# Patient Record
Sex: Male | Born: 1980
Health system: Southern US, Community
[De-identification: ages and names within clinical notes are randomized; demographics above are authoritative.]

## PROBLEM LIST (undated history)

## (undated) DIAGNOSIS — K219 Gastro-esophageal reflux disease without esophagitis: Secondary | ICD-10-CM

## (undated) DIAGNOSIS — I428 Other cardiomyopathies: Secondary | ICD-10-CM

## (undated) DIAGNOSIS — I10 Essential (primary) hypertension: Secondary | ICD-10-CM

## (undated) DIAGNOSIS — D638 Anemia in other chronic diseases classified elsewhere: Secondary | ICD-10-CM

## (undated) DIAGNOSIS — N186 End stage renal disease: Secondary | ICD-10-CM

## (undated) DIAGNOSIS — E119 Type 2 diabetes mellitus without complications: Secondary | ICD-10-CM

## (undated) DIAGNOSIS — I119 Hypertensive heart disease without heart failure: Secondary | ICD-10-CM

## (undated) DIAGNOSIS — I5042 Chronic combined systolic (congestive) and diastolic (congestive) heart failure: Secondary | ICD-10-CM

## (undated) DIAGNOSIS — M1990 Unspecified osteoarthritis, unspecified site: Secondary | ICD-10-CM

## (undated) DIAGNOSIS — Z9889 Other specified postprocedural states: Secondary | ICD-10-CM

## (undated) DIAGNOSIS — J40 Bronchitis, not specified as acute or chronic: Secondary | ICD-10-CM

## (undated) DIAGNOSIS — R51 Headache: Secondary | ICD-10-CM

## (undated) DIAGNOSIS — F419 Anxiety disorder, unspecified: Secondary | ICD-10-CM

## (undated) DIAGNOSIS — G4730 Sleep apnea, unspecified: Secondary | ICD-10-CM

## (undated) HISTORY — DX: Unspecified osteoarthritis, unspecified site: M19.90

## (undated) HISTORY — DX: End stage renal disease: N18.6

## (undated) HISTORY — DX: Hypertensive heart disease without heart failure: I11.9

## (undated) HISTORY — DX: Other cardiomyopathies: I42.8

## (undated) HISTORY — DX: Essential (primary) hypertension: I10

## (undated) HISTORY — DX: Chronic combined systolic (congestive) and diastolic (congestive) heart failure: I50.42

## (undated) HISTORY — DX: Other specified postprocedural states: Z98.890

---

## 2012-07-22 LAB — BASIC METABOLIC PANEL
BUN: 4 mg/dL (ref 4–21)
Creatinine: 3.8 mg/dL — AB (ref <=1.3)

## 2012-07-29 LAB — BASIC METABOLIC PANEL
Potassium: 3.8 mmol/L (ref 3.4–5.3)
Sodium: 135 mmol/L — AB (ref 137–147)

## 2012-09-02 ENCOUNTER — Ambulatory Visit: Payer: Self-pay | Admitting: Family Medicine

## 2012-09-02 VITALS — BP 154/95 | HR 95 | Temp 98.3°F | Resp 16 | Ht 71.4 in | Wt 261.2 lb

## 2012-09-02 DIAGNOSIS — G609 Hereditary and idiopathic neuropathy, unspecified: Secondary | ICD-10-CM

## 2012-09-02 DIAGNOSIS — I10 Essential (primary) hypertension: Secondary | ICD-10-CM

## 2012-09-02 DIAGNOSIS — E1129 Type 2 diabetes mellitus with other diabetic kidney complication: Secondary | ICD-10-CM

## 2012-09-02 DIAGNOSIS — E1121 Type 2 diabetes mellitus with diabetic nephropathy: Secondary | ICD-10-CM | POA: Insufficient documentation

## 2012-09-02 DIAGNOSIS — E119 Type 2 diabetes mellitus without complications: Secondary | ICD-10-CM | POA: Insufficient documentation

## 2012-09-02 LAB — BASIC METABOLIC PANEL
BUN: 36 mg/dL — ABNORMAL HIGH (ref 6–23)
CO2: 20 mEq/L (ref 19–32)
Chloride: 105 mEq/L (ref 96–112)
Creat: 3.41 mg/dL — ABNORMAL HIGH (ref 0.50–1.35)
Glucose, Bld: 94 mg/dL (ref 70–99)
Potassium: 4.7 mEq/L (ref 3.5–5.3)

## 2012-09-02 MED ORDER — HYDRALAZINE HCL 100 MG PO TABS: 100.0000 mg | ORAL_TABLET | Freq: Three times a day (TID) | ORAL | Status: DC

## 2012-09-02 MED ORDER — GLYBURIDE 5 MG PO TABS: 5.0000 mg | ORAL_TABLET | Freq: Every day | ORAL | Status: DC

## 2012-09-02 MED ORDER — NIFEDIPINE ER OSMOTIC RELEASE 60 MG PO TB24: 60.0000 mg | ORAL_TABLET | Freq: Two times a day (BID) | ORAL | Status: DC

## 2012-09-02 MED ORDER — CLONIDINE HCL 0.3 MG PO TABS: 0.3000 mg | ORAL_TABLET | Freq: Two times a day (BID) | ORAL | Status: DC

## 2012-09-02 MED ORDER — GABAPENTIN 100 MG PO CAPS: 300.0000 mg | ORAL_CAPSULE | Freq: Three times a day (TID) | ORAL | Status: DC

## 2012-09-02 MED ORDER — ISOSORBIDE MONONITRATE ER 60 MG PO TB24: 60.0000 mg | ORAL_TABLET | Freq: Two times a day (BID) | ORAL | Status: DC

## 2012-09-02 MED ORDER — LISINOPRIL 40 MG PO TABS: 40.0000 mg | ORAL_TABLET | Freq: Two times a day (BID) | ORAL | Status: DC

## 2012-09-02 MED ORDER — CARVEDILOL 25 MG PO TABS: 25.0000 mg | ORAL_TABLET | Freq: Two times a day (BID) | ORAL | Status: DC

## 2012-09-02 NOTE — Progress Notes (Addendum)
Subjective: 32 year old man who recently moved here from new been [place]. He been in the hospital down there 3 weeks ago with not feeling good. He been diagnosed with having bad diabetes. He is seeing a kidney specialist and told that his kidneys were working at 44%. He is a diabetic. He comes in here with a number of medications that he needs to be refilled. He does not have a history of any other back major illnesses or injuries past. He did turn his ankle a few days ago. He does not have insurance and he is not want me to get an x-ray of the ankle this time.  He formerly was a Scientist, clinical (histocompatibility and immunogenetics), and is looking around for a job here. He is staying with some friends. He has been watching his diet, eating less carbohydrates, has lost 13 pounds in the past month.  Objective: Pleasant alert oriented gentleman in no major acute distress. Walking stick is for himself because of the ankle sprain. He has neck supple without nodes. Chest clear to auscultation. Heart regular without murmurs. With 2+ edema of his feet and legs, more on the left. Neurosensory is adequate though he has been told he has peripheral neuropathy. Feet do burn at times.  Assessment: Type 2 diabetes mellitus Hypertension, unsatisfactory control CKD Weight loss by dieting  Plan: We'll try and get his old hospital records. He has not known exactly why he is on the imdur.   Those records came from the hospital when he was ready to lose. He did have a significant elevation of the BUN and creatinine anything the hospital, consistent with significant kidney disease. His creatinine had come down to 2.5 by the time of discharge. Wet sugars were generally controlled. We will scan those records in his chart. He is to return in 1 month. I will see what it is been met shows.

## 2012-09-02 NOTE — Patient Instructions (Addendum)
Continue current medications   Return in one month for recheck, sooner if worse  For diabetic information I do recommend the American diabetic association website.

## 2012-09-06 ENCOUNTER — Telehealth: Payer: Self-pay

## 2012-09-06 NOTE — Telephone Encounter (Signed)
Pt is calling back because he states that someone called him about 3 today

## 2012-09-07 NOTE — Telephone Encounter (Signed)
Called patient, what is he referring to ? Looks like lab is also trying to contact him, left message for him to call back about labs.

## 2012-09-07 NOTE — Telephone Encounter (Signed)
Spoke with patient and he will call back later to discuss issues.

## 2012-09-26 ENCOUNTER — Emergency Department (HOSPITAL_COMMUNITY): Payer: Self-pay

## 2012-09-26 ENCOUNTER — Inpatient Hospital Stay (HOSPITAL_COMMUNITY)
Admission: EM | Admit: 2012-09-26 | Discharge: 2012-09-29 | DRG: 683 | Disposition: A | Discharge status: Home / Self Care | Payer: MEDICAID | Attending: Family Medicine | Admitting: Family Medicine

## 2012-09-26 ENCOUNTER — Observation Stay (HOSPITAL_COMMUNITY): Payer: Self-pay

## 2012-09-26 ENCOUNTER — Encounter (HOSPITAL_COMMUNITY): Payer: Self-pay | Admitting: *Deleted

## 2012-09-26 DIAGNOSIS — I10 Essential (primary) hypertension: Secondary | ICD-10-CM

## 2012-09-26 DIAGNOSIS — I129 Hypertensive chronic kidney disease with stage 1 through stage 4 chronic kidney disease, or unspecified chronic kidney disease: Secondary | ICD-10-CM

## 2012-09-26 DIAGNOSIS — Z9119 Patient's noncompliance with other medical treatment and regimen: Secondary | ICD-10-CM

## 2012-09-26 DIAGNOSIS — E1121 Type 2 diabetes mellitus with diabetic nephropathy: Secondary | ICD-10-CM

## 2012-09-26 DIAGNOSIS — N186 End stage renal disease: Secondary | ICD-10-CM

## 2012-09-26 DIAGNOSIS — R112 Nausea with vomiting, unspecified: Secondary | ICD-10-CM

## 2012-09-26 DIAGNOSIS — E119 Type 2 diabetes mellitus without complications: Secondary | ICD-10-CM

## 2012-09-26 DIAGNOSIS — E860 Dehydration: Secondary | ICD-10-CM

## 2012-09-26 DIAGNOSIS — I161 Hypertensive emergency: Secondary | ICD-10-CM

## 2012-09-26 DIAGNOSIS — D631 Anemia in chronic kidney disease: Secondary | ICD-10-CM | POA: Diagnosis present

## 2012-09-26 DIAGNOSIS — N184 Chronic kidney disease, stage 4 (severe): Secondary | ICD-10-CM | POA: Diagnosis present

## 2012-09-26 DIAGNOSIS — N039 Chronic nephritic syndrome with unspecified morphologic changes: Secondary | ICD-10-CM | POA: Diagnosis present

## 2012-09-26 DIAGNOSIS — Z91199 Patient's noncompliance with other medical treatment and regimen due to unspecified reason: Secondary | ICD-10-CM

## 2012-09-26 DIAGNOSIS — D72829 Elevated white blood cell count, unspecified: Secondary | ICD-10-CM | POA: Diagnosis present

## 2012-09-26 DIAGNOSIS — E1129 Type 2 diabetes mellitus with other diabetic kidney complication: Secondary | ICD-10-CM

## 2012-09-26 DIAGNOSIS — N058 Unspecified nephritic syndrome with other morphologic changes: Secondary | ICD-10-CM

## 2012-09-26 DIAGNOSIS — N189 Chronic kidney disease, unspecified: Secondary | ICD-10-CM

## 2012-09-26 DIAGNOSIS — Z794 Long term (current) use of insulin: Secondary | ICD-10-CM

## 2012-09-26 LAB — CBC WITH DIFFERENTIAL/PLATELET
Basophils Absolute: 0 10*3/uL (ref 0.0–0.1)
Eosinophils Absolute: 0.2 10*3/uL (ref 0.0–0.7)
HCT: 30.5 % — ABNORMAL LOW (ref 39.0–52.0)
Lymphocytes Relative: 11 % — ABNORMAL LOW (ref 12–46)
MCHC: 33.4 g/dL (ref 30.0–36.0)
Neutro Abs: 12.7 10*3/uL — ABNORMAL HIGH (ref 1.7–7.7)
Platelets: 197 10*3/uL (ref 150–400)
RDW: 25.2 % — ABNORMAL HIGH (ref 11.5–15.5)

## 2012-09-26 LAB — URINE MICROSCOPIC-ADD ON

## 2012-09-26 LAB — POCT I-STAT, CHEM 8
Calcium, Ion: 1.15 mmol/L (ref 1.12–1.23)
Glucose, Bld: 140 mg/dL — ABNORMAL HIGH (ref 70–99)
HCT: 34 % — ABNORMAL LOW (ref 39.0–52.0)
Hemoglobin: 11.6 g/dL — ABNORMAL LOW (ref 13.0–17.0)
Potassium: 4.2 mEq/L (ref 3.5–5.1)

## 2012-09-26 LAB — COMPREHENSIVE METABOLIC PANEL
ALT: 8 U/L (ref 0–53)
Alkaline Phosphatase: 120 U/L — ABNORMAL HIGH (ref 39–117)
BUN: 35 mg/dL — ABNORMAL HIGH (ref 6–23)
CO2: 22 mEq/L (ref 19–32)
Calcium: 9.1 mg/dL (ref 8.4–10.5)
GFR calc Af Amer: 24 mL/min — ABNORMAL LOW (ref >90)
GFR calc non Af Amer: 21 mL/min — ABNORMAL LOW (ref >90)
Glucose, Bld: 142 mg/dL — ABNORMAL HIGH (ref 70–99)
Sodium: 139 mEq/L (ref 135–145)

## 2012-09-26 LAB — URINALYSIS, ROUTINE W REFLEX MICROSCOPIC
Glucose, UA: NEGATIVE mg/dL
Hgb urine dipstick: NEGATIVE
Ketones, ur: NEGATIVE mg/dL
Protein, ur: >300 mg/dL — AB

## 2012-09-26 LAB — CBC
HCT: 28.7 % — ABNORMAL LOW (ref 39.0–52.0)
Hemoglobin: 9.5 g/dL — ABNORMAL LOW (ref 13.0–17.0)
MCH: 21.6 pg — ABNORMAL LOW (ref 26.0–34.0)
MCHC: 33.1 g/dL (ref 30.0–36.0)
RBC: 4.4 MIL/uL (ref 4.22–5.81)

## 2012-09-26 LAB — GLUCOSE, CAPILLARY

## 2012-09-26 LAB — IRON AND TIBC: Iron: 48 ug/dL (ref 42–135)

## 2012-09-26 LAB — RETICULOCYTES: Retic Count, Absolute: 184.8 10*3/uL (ref 19.0–186.0)

## 2012-09-26 LAB — FOLATE: Folate: 8.6 ng/mL

## 2012-09-26 LAB — HEMOGLOBIN A1C: Mean Plasma Glucose: 114 mg/dL (ref <117)

## 2012-09-26 LAB — MAGNESIUM: Magnesium: 1.9 mg/dL (ref 1.5–2.5)

## 2012-09-26 LAB — MRSA PCR SCREENING: MRSA by PCR: NEGATIVE

## 2012-09-26 MED ORDER — ONDANSETRON HCL 4 MG/2ML IJ SOLN
4.0000 mg | Freq: Once | INTRAMUSCULAR | Status: AC
  Administered 2012-09-26: 4 mg via INTRAVENOUS
  Filled 2012-09-26: qty 2

## 2012-09-26 MED ORDER — ONDANSETRON HCL 4 MG/2ML IJ SOLN
4.0000 mg | Freq: Four times a day (QID) | INTRAMUSCULAR | Status: DC | PRN
  Administered 2012-09-26 – 2012-09-27 (×2): 4 mg via INTRAVENOUS
  Filled 2012-09-26 (×2): qty 2

## 2012-09-26 MED ORDER — LISINOPRIL 40 MG PO TABS
40.0000 mg | ORAL_TABLET | Freq: Two times a day (BID) | ORAL | Status: DC
  Filled 2012-09-26: qty 1

## 2012-09-26 MED ORDER — HYDRALAZINE HCL 20 MG/ML IJ SOLN
15.0000 mg | INTRAMUSCULAR | Status: DC | PRN
  Administered 2012-09-26: 15 mg via INTRAVENOUS
  Filled 2012-09-26 (×2): qty 1

## 2012-09-26 MED ORDER — CARVEDILOL 25 MG PO TABS
25.0000 mg | ORAL_TABLET | Freq: Two times a day (BID) | ORAL | Status: DC
  Administered 2012-09-26 – 2012-09-29 (×7): 25 mg via ORAL
  Filled 2012-09-26 (×8): qty 1

## 2012-09-26 MED ORDER — ACETAMINOPHEN 650 MG RE SUPP: 650.0000 mg | Freq: Four times a day (QID) | RECTAL | Status: DC | PRN

## 2012-09-26 MED ORDER — MORPHINE SULFATE 2 MG/ML IJ SOLN
1.0000 mg | INTRAMUSCULAR | Status: DC | PRN
  Administered 2012-09-26 – 2012-09-28 (×3): 1 mg via INTRAVENOUS
  Filled 2012-09-26 (×3): qty 1

## 2012-09-26 MED ORDER — ONDANSETRON HCL 4 MG/2ML IJ SOLN
4.0000 mg | Freq: Once | INTRAMUSCULAR | Status: AC
  Administered 2012-09-26: 4 mg via INTRAVENOUS

## 2012-09-26 MED ORDER — SODIUM CHLORIDE 0.9 % IJ SOLN
3.0000 mL | Freq: Two times a day (BID) | INTRAMUSCULAR | Status: DC
  Administered 2012-09-26 – 2012-09-29 (×5): 3 mL via INTRAVENOUS

## 2012-09-26 MED ORDER — SODIUM CHLORIDE 0.9 % IV SOLN: INTRAVENOUS | Status: DC

## 2012-09-26 MED ORDER — SODIUM CHLORIDE 0.9 % IV SOLN
INTRAVENOUS | Status: DC
  Administered 2012-09-26: 125 mL/h via INTRAVENOUS
  Administered 2012-09-27 (×2): via INTRAVENOUS

## 2012-09-26 MED ORDER — INSULIN ASPART 100 UNIT/ML ~~LOC~~ SOLN
0.0000 [IU] | Freq: Three times a day (TID) | SUBCUTANEOUS | Status: DC
  Administered 2012-09-26: 2 [IU] via SUBCUTANEOUS

## 2012-09-26 MED ORDER — ONDANSETRON HCL 4 MG PO TABS
4.0000 mg | ORAL_TABLET | Freq: Four times a day (QID) | ORAL | Status: DC | PRN
  Administered 2012-09-28: 4 mg via ORAL
  Filled 2012-09-26: qty 1

## 2012-09-26 MED ORDER — METOCLOPRAMIDE HCL 5 MG/ML IJ SOLN
10.0000 mg | Freq: Three times a day (TID) | INTRAMUSCULAR | Status: DC | PRN
  Administered 2012-09-26: 10 mg via INTRAVENOUS
  Filled 2012-09-26 (×2): qty 2

## 2012-09-26 MED ORDER — NITROPRUSSIDE SODIUM 25 MG/ML IV SOLN
0.2500 ug/kg/min | Freq: Once | INTRAVENOUS | Status: AC
  Administered 2012-09-26: 0.251 ug/kg/min via INTRAVENOUS
  Filled 2012-09-26: qty 2

## 2012-09-26 MED ORDER — LISINOPRIL 40 MG PO TABS
40.0000 mg | ORAL_TABLET | Freq: Every day | ORAL | Status: DC
  Administered 2012-09-26 – 2012-09-29 (×4): 40 mg via ORAL
  Filled 2012-09-26 (×4): qty 1

## 2012-09-26 MED ORDER — MORPHINE SULFATE 4 MG/ML IJ SOLN
4.0000 mg | Freq: Once | INTRAMUSCULAR | Status: AC
  Administered 2012-09-26: 4 mg via INTRAVENOUS
  Filled 2012-09-26: qty 1

## 2012-09-26 MED ORDER — LABETALOL HCL 5 MG/ML IV SOLN
20.0000 mg | Freq: Once | INTRAVENOUS | Status: AC
  Administered 2012-09-26: 20 mg via INTRAVENOUS
  Filled 2012-09-26: qty 4

## 2012-09-26 MED ORDER — HEPARIN SODIUM (PORCINE) 5000 UNIT/ML IJ SOLN
5000.0000 [IU] | Freq: Three times a day (TID) | INTRAMUSCULAR | Status: DC
  Administered 2012-09-26 – 2012-09-29 (×9): 5000 [IU] via SUBCUTANEOUS
  Filled 2012-09-26 (×12): qty 1

## 2012-09-26 MED ORDER — CLONIDINE HCL 0.2 MG PO TABS
0.3000 mg | ORAL_TABLET | Freq: Once | ORAL | Status: AC
  Administered 2012-09-26: 0.3 mg via ORAL
  Filled 2012-09-26: qty 1

## 2012-09-26 MED ORDER — NIFEDIPINE ER 60 MG PO TB24
60.0000 mg | ORAL_TABLET | Freq: Two times a day (BID) | ORAL | Status: DC
  Administered 2012-09-26 – 2012-09-29 (×7): 60 mg via ORAL
  Filled 2012-09-26 (×8): qty 1

## 2012-09-26 MED ORDER — ACETAMINOPHEN 325 MG PO TABS
650.0000 mg | ORAL_TABLET | Freq: Four times a day (QID) | ORAL | Status: DC | PRN
  Administered 2012-09-26 – 2012-09-28 (×3): 650 mg via ORAL
  Filled 2012-09-26 (×3): qty 2

## 2012-09-26 MED ORDER — ISOSORBIDE MONONITRATE ER 60 MG PO TB24
120.0000 mg | ORAL_TABLET | Freq: Every day | ORAL | Status: DC
  Administered 2012-09-26 – 2012-09-29 (×4): 120 mg via ORAL
  Filled 2012-09-26 (×4): qty 2

## 2012-09-26 MED ORDER — CLONIDINE HCL 0.3 MG PO TABS
0.3000 mg | ORAL_TABLET | Freq: Three times a day (TID) | ORAL | Status: DC
  Administered 2012-09-26 – 2012-09-29 (×10): 0.3 mg via ORAL
  Filled 2012-09-26 (×11): qty 1

## 2012-09-26 MED ORDER — LABETALOL HCL 5 MG/ML IV SOLN
10.0000 mg | INTRAVENOUS | Status: DC | PRN
  Administered 2012-09-26 (×2): 10 mg via INTRAVENOUS
  Filled 2012-09-26 (×2): qty 4

## 2012-09-26 MED ORDER — HYDRALAZINE HCL 20 MG/ML IJ SOLN
20.0000 mg | INTRAMUSCULAR | Status: DC | PRN
  Administered 2012-09-26: 20 mg via INTRAVENOUS
  Filled 2012-09-26: qty 1

## 2012-09-26 MED ORDER — INSULIN ASPART 100 UNIT/ML ~~LOC~~ SOLN
0.0000 [IU] | Freq: Three times a day (TID) | SUBCUTANEOUS | Status: DC
  Administered 2012-09-26 – 2012-09-27 (×2): 1 [IU] via SUBCUTANEOUS
  Administered 2012-09-27: 2 [IU] via SUBCUTANEOUS
  Administered 2012-09-27: 1 [IU] via SUBCUTANEOUS
  Administered 2012-09-28: 14:00:00 via SUBCUTANEOUS
  Administered 2012-09-29: 1 [IU] via SUBCUTANEOUS

## 2012-09-26 MED ORDER — LABETALOL HCL 5 MG/ML IV SOLN
20.0000 mg | Freq: Once | INTRAVENOUS | Status: AC
  Administered 2012-09-26: 20 mg via INTRAVENOUS
  Filled 2012-09-26 (×2): qty 4

## 2012-09-26 MED ORDER — HYDRALAZINE HCL 20 MG/ML IJ SOLN: 10.0000 mg | INTRAMUSCULAR | Status: DC | PRN

## 2012-09-26 MED ORDER — HYDRALAZINE HCL 20 MG/ML IJ SOLN
25.0000 mg | Freq: Once | INTRAMUSCULAR | Status: AC
  Administered 2012-09-26: 25 mg via INTRAVENOUS
  Filled 2012-09-26: qty 1

## 2012-09-26 NOTE — ED Notes (Signed)
Care transferred, report received Otto Herb, Therapist, sports.

## 2012-09-26 NOTE — ED Notes (Addendum)
Hypertensive. No cp, sob, and no stroke like symptoms.  Not able to keep nothing down - did not take bp meds. Today, having h/a's, so called ems b/c of high bp.  Sob with exertion. cbg 155; manual bp 250/180 - x 2 checks.

## 2012-09-26 NOTE — H&P (Signed)
[facility] Admission History and Physical Service Pager: [phone]  Patient name: Joshua [name] Medical record number: [id number] Date of birth: [birth year] Age: 32 y.o. Gender: male  Primary Care Provider: Provider Not In System  Chief Complaint: Nausea, Vomiting, Headache  Assessment and Plan: Joshua [name] is a 32 y.o. year old male with PMH of DM, CKD, and poorly controlled HTN who presents with recent nausea/vomiting, dehydration, and Hypertensive emergency.  # Hypertensive Emergency - Etiology presumed secondary to not taking any of his six PO HTN meds in past 7 days; No evidence of secondary cause of HTN at this point - Will admit to step down, Attending Dr. Lindell Noe. - Will start patient Hydralazine 15 mg Q4 PRN for BP >200/110 (Per ICU physician recommendations) - Home Clonidine 0.3 mg increased to TID.  Will continue home Coreg 25 mg BID, Imdur 120 mg daily, Lisinopril 40 mg BID and Procardia 60 mg BID.   - Will monitor closely in Step down unit.  Will consider further workup for secondary causes of HTN if BP continues to remain elevated despite treatment. - Obtain UDS  # Dehydration secondary to Nausea/vomiting and poor PO intake - Etiology of nausea/vomiting is possible diabetic gastroparesis, post-viral ileus - Patient appears dehydrated on physical exam with dry mucous membranes. - Will continue IV fluids at 125 mL/hr. - Zofran and Reglan for nausea/vomiting - Will start patient on clear liquid diet and advance as tolerated  # DM - CBG 140 on admission. No prior A1C's documented in EPIC - Will obtain A1C - Will start patient on SSI (sensitive).  CBG checks TID AC and QHS - Will monitor closely and titrate Insulin as needed.  # CKD - Baseline Creatinine 3.4 - 4.0. - Creatine appears stable at 3.5 on admission. - Will monitor closely via BMP's. - Consider inpatient nephrology consultation   # Anemia  - Hb 11.6 on admission. - Will obtain  Anemia panel to further evaluate.  FEN/GI: NS @ 125 mL/hr, Clear liquid diet Prophylaxis: Heparin SQ Disposition: Pending clinical improvement and stabilization of BP Code Status: full code  History of Present Illness:  Joshua [name] is a 32 y.o. year old male with PMH of DM, CKD, and poorly controlled HTN who presents to the ED with headache, N/V.  He has recently relocated to the area. He has been followed by a nephrologist previously.  He reports severe, intractable nausea/vomiting over the past 7 days (since Monday, 4/28).  He states that he has been unable to keep solids and liquids down over this period of time. Additionally, the patient has not taken his home PO HTN meds.  He has had no recent sick contacts.  He denies ingestion of uncooked/undercook meats/foods.  No relieving factors.  He denies associated abdominal pain, diarrhea, or fever. Emesis described as nonbilious and nonbloody.  Given severe nausea/vomiting, he went to the ED for further evaluation.  In the ED, patient was found to have BP of 252/182.  He also endorsed headache as well.  Nausea was treated with Zofran and BP was treated with Labetalol 20 mg x 2, Hydralazine 25 mg x 1, and Clonidine 0.3 mg x 1.  BP continued to be elevated and he was then started on Nipride gtt.  Given markedly elevated BP, CCM was consulted and Nipride was discontinued.  BP improved to ~190's/120-130's.  Family medicine was then called for admission.   Patient Active Problem List   Diagnosis Date Noted  . Diabetes 09/02/2012  .  Diabetic kidney disease 09/02/2012  . HTN (hypertension), benign 09/02/2012   Past Medical History: Past Medical History  Diagnosis Date  . Arthritis   . Diabetes mellitus without complication   . Chronic kidney disease   . Hypertension    Past Surgical History: History reviewed. No pertinent past surgical history. Social History: History  Substance Use Topics  . Smoking status: Never Smoker   . Smokeless  tobacco: Not on file  . Alcohol Use: No    Family History: Family History  Problem Relation Age of Onset  . Diabetes Father   . Hypertension Father   . Hypertension Maternal Grandmother   . Hypertension Maternal Grandfather   . Kidney disease Maternal Grandfather   . Diabetes Maternal Grandfather    Allergies: No Known Allergies No current facility-administered medications on file prior to encounter.   Current Outpatient Prescriptions on File Prior to Encounter  Medication Sig Dispense Refill  . aspirin 325 MG tablet Take 325 mg by mouth daily.      . carvedilol (COREG) 25 MG tablet Take 1 tablet (25 mg total) by mouth 2 (two) times daily.  60 tablet  2  . cloNIDine (CATAPRES) 0.3 MG tablet Take 1 tablet (0.3 mg total) by mouth 2 (two) times daily.  60 tablet  2  . ferrous sulfate 325 (65 FE) MG tablet Take 325 mg by mouth 2 (two) times daily.      Marland Kitchen gabapentin (NEURONTIN) 100 MG capsule Take 3 capsules (300 mg total) by mouth 3 (three) times daily.  90 capsule  3  . glyBURIDE (DIABETA) 5 MG tablet Take 1 tablet (5 mg total) by mouth daily.  30 tablet  3  . hydrALAZINE (APRESOLINE) 100 MG tablet Take 1 tablet (100 mg total) by mouth 3 (three) times daily.  90 tablet  3  . isosorbide mononitrate (IMDUR) 60 MG 24 hr tablet Take 1 tablet (60 mg total) by mouth 2 (two) times daily.  60 tablet  2  . lisinopril (PRINIVIL,ZESTRIL) 40 MG tablet Take 1 tablet (40 mg total) by mouth 2 (two) times daily.  60 tablet  2  . NIFEdipine (PROCARDIA XL/ADALAT-CC) 60 MG 24 hr tablet Take 1 tablet (60 mg total) by mouth 2 (two) times daily.  60 tablet  2   Review Of Systems: Per HPI. Otherwise 12 point review of systems was performed and was unremarkable.  Physical Exam: BP 209/138  Pulse 88  Temp(Src) 97.3 F (36.3 C) (Oral)  Resp 19  Ht 5' 11.5" (1.816 m)  Wt 255 lb (115.667 kg)  BMI 35.07 kg/m2  SpO2 100% Exam: General: resting in bed, appears uncomfortable, but only endorses mild  headache. HEENT: NCAT. PERRLA.  Unable to visualize optic disc secondary to constricted pupils. Cardiovascular: RRR. No murmurs, rubs, or gallops. Respiratory: CTAB. No rales, rhonchi, or wheezing appreciated. Abdomen: obese, soft, nontender, nondistended. No palpable organomegaly. +BS Extremities: No edema noted. Skin: Hyperpigmentation noted on back. Acanthosis nigricans also noted. Neuro: AO x 3. No focal deficits on exam.  Labs and Imaging: CBC BMET   Recent Labs Lab 09/26/12 0349 09/26/12 0411  WBC 15.2*  --   HGB 10.2* 11.6*  HCT 30.5* 34.0*  PLT 197  --     Recent Labs Lab 09/26/12 0411  NA 140  K 4.2  CL 108  BUN 35*  CREATININE 3.50*  GLUCOSE 140*     Ct Head Wo Contrast 09/26/2012  *RADIOLOGY REPORT*  Clinical Data: Hypertension, nausea, emesis.  CT  HEAD WITHOUT CONTRAST  Technique:  Contiguous axial images were obtained from the base of the skull through the vertex without contrast.  Comparison: None.  Findings: Subtle white matter hypoattenuation is suggested, more pronounced within the posterior centrum semiovale right greater than left.  There is no evidence for acute hemorrhage, hydrocephalus, mass lesion, or abnormal extra-axial fluid collection.  No definite CT evidence for acute infarction.  The visualized paranasal sinuses and mastoid air cells are predominately clear.  IMPRESSION: Nonspecific mild white matter hypoattenuation as above. MR has increased sensitivity and specificity for posterior reversible encephalopathy syndrome (PRES) if clinically warranted.    Thersa Salt, DO 09/26/2012, 8:49 AM  I have examined this patient and reviewed Dr. Jonathon Jordan documentation. I have made minor additions and agree with the assessment and plan.   Marena Chancy Maricela Bo, MD, MBA 09/26/2012, 1:12 PM Family Medicine Resident, PGY-2 [phone] pager

## 2012-09-26 NOTE — ED Notes (Signed)
Pt with nausea and emesis. Dr. Lindell Noe made aware.

## 2012-09-26 NOTE — Consult Note (Signed)
PULMONARY  / CRITICAL CARE MEDICINE  Name: Joshua [name] MRN: [id number] DOB: Jun 21, 1980    ADMISSION DATE:  09/26/2012 CONSULTATION DATE:  09/26/2012   REFERRING MD :  ED PRIMARY SERVICE: Internal Medicine  CHIEF COMPLAINT:  Headache  BRIEF PATIENT DESCRIPTION: 83 year AA with HTN, DM, CKD. Stopped taking BP meds for last week due to nausea. Presents with complaints of worsening headache and BP 250/130  SIGNIFICANT EVENTS / STUDIES:  5/4>> 250/135 BP in ED, improved to 199/110 after labetlol IVP x2, hydralazine 25mg  IVP and clonidine 0.3 mg oral x1. Headache also improved  LINES / TUBES: PIV  CULTURES: none  ANTIBIOTICS: none  HISTORY OF PRESENT ILLNESS:  32 year old african [place] male with hard to control HTN, DM and CKD. Presents to the ED with complaints of worsening headache over the last 2 days. He describes the HA as throbbing, 10/10 intensity at its peak, no associated symptoms. Made better only after administration of morphine in ED. Patient is on multiple BP medication including clonidine which he has not taken since last Sunday night to nausea that started last Monday. Episode of vomiting from Monday through Thursday which he describes as gastric contents, last episode of emesis was Thursday. In the ED was found to have BP 250/135, CT head negative for acute changes. Started briefly on nitroprusside gtt.   CCM called for evaluation of hypertensive urgency.  PAST MEDICAL HISTORY :  Past Medical History  Diagnosis Date  . Arthritis   . Diabetes mellitus without complication   . Chronic kidney disease   . Hypertension    History reviewed. No pertinent past surgical history. Prior to Admission medications   Medication Sig Start Date End Date Taking? Authorizing Provider  aspirin 325 MG tablet Take 325 mg by mouth daily.   Yes Historical Provider, MD  carvedilol (COREG) 25 MG tablet Take 1 tablet (25 mg total) by mouth 2 (two) times daily. 09/02/12  Yes Posey Boyer,  MD  cloNIDine (CATAPRES) 0.3 MG tablet Take 1 tablet (0.3 mg total) by mouth 2 (two) times daily. 09/02/12  Yes Posey Boyer, MD  ferrous sulfate 325 (65 FE) MG tablet Take 325 mg by mouth 2 (two) times daily.   Yes Historical Provider, MD  gabapentin (NEURONTIN) 100 MG capsule Take 3 capsules (300 mg total) by mouth 3 (three) times daily. 09/02/12  Yes Posey Boyer, MD  glyBURIDE (DIABETA) 5 MG tablet Take 1 tablet (5 mg total) by mouth daily. 09/02/12  Yes Posey Boyer, MD  hydrALAZINE (APRESOLINE) 100 MG tablet Take 1 tablet (100 mg total) by mouth 3 (three) times daily. 09/02/12  Yes Posey Boyer, MD  isosorbide mononitrate (IMDUR) 60 MG 24 hr tablet Take 1 tablet (60 mg total) by mouth 2 (two) times daily. 09/02/12  Yes Posey Boyer, MD  lisinopril (PRINIVIL,ZESTRIL) 40 MG tablet Take 1 tablet (40 mg total) by mouth 2 (two) times daily. 09/02/12  Yes Posey Boyer, MD  NIFEdipine (PROCARDIA XL/ADALAT-CC) 60 MG 24 hr tablet Take 1 tablet (60 mg total) by mouth 2 (two) times daily. 09/02/12  Yes Posey Boyer, MD   No Known Allergies  FAMILY HISTORY:  Family History  Problem Relation Age of Onset  . Diabetes Father   . Hypertension Father   . Hypertension Maternal Grandmother   . Hypertension Maternal Grandfather   . Kidney disease Maternal Grandfather   . Diabetes Maternal Grandfather    SOCIAL HISTORY:  reports that  he has never smoked. He does not have any smokeless tobacco history on file. He reports that he does not drink alcohol or use illicit drugs.  REVIEW OF SYSTEMS:   Positives in BOLD  Constitutional: Negative for fever, chills, weight loss, malaise/fatigue and diaphoresis.  HENT: Negative for hearing loss, ear pain, nosebleeds, congestion, sore throat, neck pain, tinnitus and ear discharge.  Eyes: Negative for blurred vision, double vision, photophobia, pain, discharge and redness.  Respiratory: cough, hemoptysis, sputum production, shortness of breath, wheezing and  no stridor.  Cardiovascular: Negative for chest pain, palpitations, orthopnea, claudication, leg swelling and PND.  Gastrointestinal: Negative for heartburn, nausea, vomiting (last episode Thursday), abdominal pain, diarrhea, constipation, blood in stool and melena.  Genitourinary: Negative for dysuria, urgency, frequency, hematuria and flank pain.  Musculoskeletal: Negative for myalgias, back pain, joint pain and falls.  Skin: Negative for itching and rash.  Neurological: Negative for dizziness, tingling, tremors, sensory change, speech change, focal weakness, seizures, loss of consciousness, weakness and headaches.  Endo/Heme/Allergies: Negative for environmental allergies and polydipsia. Does not bruise/bleed easily   SUBJECTIVE:   VITAL SIGNS: Temp:  [97.6 F (36.4 C)] 97.6 F (36.4 C) (05/04 0330) Pulse Rate:  [85-109] 85 (05/04 0445) Resp:  [16-36] 23 (05/04 0445) BP: (206-252)/(143-182) 227/154 mmHg (05/04 0445) SpO2:  [100 %] 100 % (05/04 0445) Weight:  [115.667 kg (255 lb)] 115.667 kg (255 lb) (05/04 0350) HEMODYNAMICS:   VENTILATOR SETTINGS:   INTAKE / OUTPUT: Intake/Output   None     PHYSICAL EXAMINATION: General: Well developed, well nourished, in no acute distress  Head: Eyes PERRLA, No xanthomas. Normal cephalic and atramatic. No papilledema noted Lungs: Clear bilaterally to auscultation and percussion.  Heart: HRRR S1 S2 Pulses are 2+ & equal.  No carotid bruit. No JVD. No abdominal bruits. No femoral bruits.  Abdomen: Bowel sounds are positive, abdomen soft and non-tender without masses  Extremities: No clubbing, cyanosis or edema. DP +1  Neuro: Alert and oriented X 3. No focal motor or sensory deficits Psych: Good affect, responds appropriately  LABS:  Recent Labs Lab 09/26/12 0349 09/26/12 0411  HGB 10.2* 11.6*  WBC 15.2*  --   PLT 197  --   NA  --  140  K  --  4.2  CL  --  108  GLUCOSE  --  140*  BUN  --  35*  CREATININE  --  3.50*   No  results found for this basename: GLUCAP,  in the last 168 hours  CXR: not done  ASSESSMENT / PLAN:  PULMONARY A:no acute issue P:    CARDIOVASCULAR A: Hypertensive urgency P:  -resume home regimen, medication. -monitor BP -add PRN hydralazine q 4 hrs for now -very young patient with hard to control HTN, would call PCP and see if w/u for secondary HTN done.  RENAL A:  CKD likely due to HTN and DM P:   -monitor renal function, urine output, avoid nephrotoxic medications -renally dose all meds  GASTROINTESTINAL A:  Nausea and Vomiting P:   -unclear etiology, maybe viral infections -resolved; last episode was Thursday as per patient -cardiac diet  HEMATOLOGIC A:  Microcytic anemia P:  -check stool guaiac, iron studies. -monitor H/H -transfuse if hgb<7.0  INFECTIOUS A:  No acute issues P:     ENDOCRINE A:  DM   P:   -check A1c -monitor FSBS -if able to tolerate diet would start oral hypoglycemic, in meantime would use SQ insulin  NEUROLOGIC A:  Heacheache  P:   -likely due to uncontrolled BP -CT head negative  TODAY'S SUMMARY: hypertensive urgency, briefly on nitroprusside gtt. BP improved after IV hydralazine and clonidine 0.3 mg.   I have personally obtained a history, examined the patient, evaluated laboratory and imaging results, formulated the assessment and plan and placed orders. CRITICAL CARE: The patient is critically ill with multiple organ systems failure and requires high complexity decision making for assessment and support, frequent evaluation and titration of therapies, application of advanced monitoring technologies and extensive interpretation of multiple databases. Critical Care Time devoted to patient care services described in this note is 30 minutes.    Pulmonary and [place] Pager: [phone]  09/26/2012, 7:01 AM

## 2012-09-26 NOTE — ED Notes (Signed)
Critical care medicine at bedside

## 2012-09-26 NOTE — ED Notes (Signed)
Critical care medicine to d/c nitroprusside.

## 2012-09-26 NOTE — H&P (Signed)
[facility] Attending Admit Note Patient seen and examined by me, discussed with resident team and I agree with their assessment and plan. Patient with malignant hypertension, markedly elevated BP with associated HA, admits to not having his home BP medications recently.  Moved to [place] from the [place] area.  His elevated serum Cr today (3.5) is in keeping with prior readings that are available in Epic. Plan for admission to SDU; re-establishment of home BP meds. DM control.  Old records from his prior doctors to establish prior workup for likely secondary HTN. Dalbert Mayotte, MD

## 2012-09-26 NOTE — ED Provider Notes (Signed)
History     CSN: [id number]  Arrival date & time 09/26/12  [id number]   First MD Initiated Contact with Patient 09/26/12 [phone]      Chief Complaint  Patient presents with  . Nausea  . Emesis  . Hypertension    (Consider location/radiation/quality/duration/timing/severity/associated sxs/prior treatment) HPI Complains of headache gradual onset yesterday and vomiting for the past 5 days. Patient has been unable to hold down any of his medications. He denies visual changes denies abdominal pain denies chest pain denies shortness of breath. No other associated symptoms. No treatment prior to coming here. Past Medical History  Diagnosis Date  . Arthritis   . Diabetes mellitus without complication   . Chronic kidney disease   . Hypertension     History reviewed. No pertinent past surgical history.  Family History  Problem Relation Age of Onset  . Diabetes Father   . Hypertension Father   . Hypertension Maternal Grandmother   . Hypertension Maternal Grandfather   . Kidney disease Maternal Grandfather   . Diabetes Maternal Grandfather     History  Substance Use Topics  . Smoking status: Never Smoker   . Smokeless tobacco: Not on file  . Alcohol Use: No     no alcohol no illicit drug use  Review of Systems  Constitutional: Negative.   Respiratory: Negative.   Cardiovascular: Negative.   Gastrointestinal: Positive for nausea and vomiting.  Musculoskeletal: Negative.   Skin: Negative.   Neurological: Positive for headaches.  Psychiatric/Behavioral: Negative.   All other systems reviewed and are negative.    Allergies  Review of patient's allergies indicates no known allergies.  Home Medications   Current Outpatient Rx  Name  Route  Sig  Dispense  Refill  . aspirin 325 MG tablet   Oral   Take 325 mg by mouth daily.         . carvedilol (COREG) 25 MG tablet   Oral   Take 1 tablet (25 mg total) by mouth 2 (two) times daily.   60 tablet   2   . cloNIDine  (CATAPRES) 0.3 MG tablet   Oral   Take 1 tablet (0.3 mg total) by mouth 2 (two) times daily.   60 tablet   2   . ferrous sulfate 325 (65 FE) MG tablet   Oral   Take 325 mg by mouth 2 (two) times daily.         Marland Kitchen gabapentin (NEURONTIN) 100 MG capsule   Oral   Take 3 capsules (300 mg total) by mouth 3 (three) times daily.   90 capsule   3   . glyBURIDE (DIABETA) 5 MG tablet   Oral   Take 1 tablet (5 mg total) by mouth daily.   30 tablet   3   . hydrALAZINE (APRESOLINE) 100 MG tablet   Oral   Take 1 tablet (100 mg total) by mouth 3 (three) times daily.   90 tablet   3   . isosorbide mononitrate (IMDUR) 60 MG 24 hr tablet   Oral   Take 1 tablet (60 mg total) by mouth 2 (two) times daily.   60 tablet   2   . lisinopril (PRINIVIL,ZESTRIL) 40 MG tablet   Oral   Take 1 tablet (40 mg total) by mouth 2 (two) times daily.   60 tablet   2   . NIFEdipine (PROCARDIA XL/ADALAT-CC) 60 MG 24 hr tablet   Oral   Take 1 tablet (60 mg total) by mouth  2 (two) times daily.   60 tablet   2     BP 242/176  Pulse 108  Temp(Src) 97.6 F (36.4 C) (Oral)  Resp 20  Ht 5' 11.5" (1.816 m)  Wt 255 lb (115.667 kg)  BMI 35.07 kg/m2  SpO2 100%  Physical Exam  Nursing note and vitals reviewed. Constitutional: He appears well-developed and well-nourished. He appears distressed.  Alert Glasgow Coma Score 15 appears mildly uncomfortable  HENT:  Head: Normocephalic and atraumatic.  Eyes: Conjunctivae are normal. Pupils are equal, round, and reactive to light.  Fundi not well visualized  Neck: Neck supple. No tracheal deviation present. No thyromegaly present.  Cardiovascular: Normal rate and regular rhythm.   No murmur heard. Pulmonary/Chest: Effort normal and breath sounds normal.  Abdominal: Soft. Bowel sounds are normal. He exhibits no distension. There is no tenderness.  Obese  Musculoskeletal: Normal range of motion. He exhibits no edema and no tenderness.  Neurological: He is  alert. Coordination normal.  Skin: Skin is warm and dry. No rash noted.  Psychiatric: He has a normal mood and affect.    ED Course  Procedures (including critical care time)  Labs Reviewed  CBC WITH DIFFERENTIAL   No results found.   No diagnosis found.    Date: 09/26/2012  Rate: 110  Rhythm: sinus tachycardia  QRS Axis: normal  Intervals: QT prolonged  ST/T Wave abnormalities: nonspecific ST/T changes  Conduction Disutrbances:none  Narrative Interpretation:   Old EKG Reviewed: none available Nitroprusside intravenous drip ordered to titrate to systolic blood pressure of 160 5:45 AM headache and nausea much improved after treatment with morphine, Zofran, nitroprusside intravenous drip and labetalol intravenously. Patient is alert Glasgow Coma Score 15. Nausea is minimal at present. MDM  I spoke with Dr.Deterding,, intensivist. She will send intensive care unit fellow to evaluate patient for admission to intensive care unit Diagnosis #1 hypertensive emergency  #2 renal insufficiency #3 hyperglycemia CRITICAL CARE Performed by: Orlie Dakin   Total critical care time: 40 minute  Critical care time was exclusive of separately billable procedures and treating other patients.  Critical care was necessary to treat or prevent imminent or life-threatening deterioration.  Critical care was time spent personally by me on the following activities: development of treatment plan with patient and/or surrogate as well as nursing, discussions with consultants, evaluation of patient's response to treatment, examination of patient, obtaining history from patient or surrogate, ordering and performing treatments and interventions, ordering and review of laboratory studies, ordering and review of radiographic studies, pulse oximetry and re-evaluation of patient's condition.       Orlie Dakin, MD 09/26/12 [phone]

## 2012-09-26 NOTE — Progress Notes (Addendum)
Patient cc/o headache which woke him up from sleeping,suddenly became violently nauseated and vomited clear yellow emesis 300 cc's. Medicated with tylenol 650 mg po which   Patient kept down, and got Reglan 10 mg IV.Blood pressure is up again 180/114 and received hydralazine 15 mg IV.Blood pressure rechecked an now it is 146/90 and patient is sleeping at present

## 2012-09-27 DIAGNOSIS — I10 Essential (primary) hypertension: Secondary | ICD-10-CM

## 2012-09-27 LAB — GLUCOSE, CAPILLARY
Glucose-Capillary: 129 mg/dL — ABNORMAL HIGH (ref 70–99)
Glucose-Capillary: 108 mg/dL — ABNORMAL HIGH (ref 70–99)

## 2012-09-27 LAB — DIFFERENTIAL
Basophils Absolute: 0 10*3/uL (ref 0.0–0.1)
Eosinophils Absolute: 0 10*3/uL (ref 0.0–0.7)
Lymphocytes Relative: 7 % — ABNORMAL LOW (ref 12–46)
Neutrophils Relative %: 88 % — ABNORMAL HIGH (ref 43–77)

## 2012-09-27 LAB — CBC
HCT: 30.2 % — ABNORMAL LOW (ref 39.0–52.0)
Hemoglobin: 9.6 g/dL — ABNORMAL LOW (ref 13.0–17.0)
Hemoglobin: 10.4 g/dL — ABNORMAL LOW (ref 13.0–17.0)
MCH: 21.5 pg — ABNORMAL LOW (ref 26.0–34.0)
MCV: 65.9 fL — ABNORMAL LOW (ref 78.0–100.0)
Platelets: 199 10*3/uL (ref 150–400)
RBC: 4.46 MIL/uL (ref 4.22–5.81)
WBC: 17 10*3/uL — ABNORMAL HIGH (ref 4.0–10.5)

## 2012-09-27 LAB — BASIC METABOLIC PANEL
BUN: 34 mg/dL — ABNORMAL HIGH (ref 6–23)
CO2: 23 mEq/L (ref 19–32)
Calcium: 8.8 mg/dL (ref 8.4–10.5)
Glucose, Bld: 135 mg/dL — ABNORMAL HIGH (ref 70–99)
Sodium: 141 mEq/L (ref 135–145)

## 2012-09-27 MED ORDER — HYDRALAZINE HCL 25 MG PO TABS
25.0000 mg | ORAL_TABLET | Freq: Four times a day (QID) | ORAL | Status: DC
  Administered 2012-09-27 – 2012-09-28 (×4): 25 mg via ORAL
  Filled 2012-09-27 (×9): qty 1

## 2012-09-27 MED ORDER — METOCLOPRAMIDE HCL 5 MG/ML IJ SOLN
10.0000 mg | Freq: Three times a day (TID) | INTRAMUSCULAR | Status: DC
  Administered 2012-09-27 – 2012-09-28 (×3): 10 mg via INTRAVENOUS
  Filled 2012-09-27 (×6): qty 2

## 2012-09-27 NOTE — Progress Notes (Signed)
Utilization review completed.  

## 2012-09-27 NOTE — Progress Notes (Signed)
FMTS Attending Admission Note: Tawnee Clegg,MD I  have seen and examined this patient, reviewed their chart. I have discussed this patient with the resident. I agree with the resident's findings, assessment and care plan.  I agree patient should get work up for secondary caused of HTN. Continue to monitor as well as temp,threshold for pan culture including lumbar puncture is low at this time due to elevated WBC with headache and N/V although no focal neurologic symptoms. Obtaining an MRI brain might be helpful at this point especially with prior abnormal CT head in the setting on continuous headache,n/v.

## 2012-09-27 NOTE — Progress Notes (Signed)
Family Medicine Teaching Service Daily Progress Note Service Page: [phone]  Subjective:  Feeling somewhat improved this am. Still reports mild headache and nausea.  Had episode of nonbloody, nonbilious emesis this am.  Objective: Temp:  [97.6 F (36.4 C)-98.5 F (36.9 C)] 97.6 F (36.4 C) (05/05 0400) Pulse Rate:  [78-93] 79 (05/05 0400) Resp:  [0-46] 32 (05/05 0400) BP: (126-213)/(71-141) 148/93 mmHg (05/05 0400) SpO2:  [98 %-100 %] 100 % (05/05 0400) Weight:  [249 lb 9 oz (113.2 kg)] 249 lb 9 oz (113.2 kg) (05/04 1146)   Intake/Output Summary (Last 24 hours) at 09/27/12 0759 Last data filed at 09/27/12 0746  Gross per 24 hour  Intake 2137.91 ml  Output   3450 ml  Net -1312.09 ml   Exam: General: resting comfortably in bed this am, NAD. Cardiovascular: RRR. No rmurmurs, rubs, or gallops. Respiratory: CTAB. No rales, rhonchi, or wheezing. Abdomen: soft, nontender, nondistended, no organomegaly. Extremities: no LE edema.  CBC BMET   Recent Labs Lab 09/26/12 0349 09/26/12 0411 09/26/12 1250 09/27/12 0443  WBC 15.2*  --  14.7* 15.1*  HGB 10.2* 11.6* 9.5* 9.6*  HCT 30.5* 34.0* 28.7* 29.4*  PLT 197  --  173 199    Recent Labs Lab 09/26/12 0411 09/26/12 1250 09/27/12 0443  NA 140 139 141  K 4.2 4.3 4.0  CL 108 105 107  CO2  --  22 23  BUN 35* 35* 34*  CREATININE 3.50* 3.66* 3.90*  GLUCOSE 140* 142* 135*  CALCIUM  --  9.1 8.8     Lab Results  Component Value Date   HGBA1C 5.6 09/26/2012   Imaging/Diagnostic Tests:  Ct Head Wo Contrast  09/26/2012 *RADIOLOGY REPORT* Clinical Data: Hypertension, nausea, emesis. CT HEAD WITHOUT CONTRAST Technique: Contiguous axial images were obtained from the base of the skull through the vertex without contrast. Comparison: None. Findings: Subtle white matter hypoattenuation is suggested, more pronounced within the posterior centrum semiovale right greater than left. There is no evidence for acute hemorrhage, hydrocephalus,  mass lesion, or abnormal extra-axial fluid collection. No definite CT evidence for acute infarction. The visualized paranasal sinuses and mastoid air cells are predominately clear. IMPRESSION: Nonspecific mild white matter hypoattenuation as above. MR has increased sensitivity and specificity for posterior reversible encephalopathy syndrome (PRES) if clinically warranted.   Assessment/Plan: # Hypertensive Emergency - Etiology presumed secondary to not taking any of his six PO HTN meds in past 7 days; No evidence of secondary cause of HTN at this point  - BP now markedly improved - 148/93 this am. - Will continue current management: Clonidine 0.3 mg TID, Coreg 25 mg BID, Imdur 120 mg daily, Lisinopril 40 mg BID and Procardia 60 mg BID.  - Will stop IV Hydralazine and change to PO 25 mg QID today.  Will titrate up if needed. - Given severe HTN, will proceed with work up for secondary causes of HTN - urine catecholamine/metanephrins, Renin/Aldosterone & ratio, and Renal US (to evaluate for Renal artery stenosis).  Holding work up for cushings at this time.   - CT scan obtained in the ED also concerned for PRES.  BP now markedly improved and patient neurologically intact.  Will consider MRI imaging if BP spikes or patient worsens clinically.  - Will monitor closely in Step down unit.   # Dehydration secondary to Nausea/vomiting and poor PO intake - Etiology unclear.  - Patient improving but still having nausea with occasional vomiting. - Will continue IV fluids @ 75 mL/hr.  Will advance diet as tolerated. - Will continue Zofran PRN.  Will schedule Reglan to aid persistent nausea.  # Leukocytosis - Patient WBC count rising (17.0 today) - In the setting of headache, there is concern for meningitis.  However, patient has no nuchal rigidity or fever.  Will consider pan culturing and LP if patient becomes febrile or worsens. - Will monitor closely.  # DM - A1C 5.6 - Will continue CBG checks TID AC and  QHS - CBG's well controlled. Will continue Sensitive SSI.  # CKD - Baseline Creatinine 3.4 - 4.0.  - Creatinine rising (3.90 this am).  However, this is still within baseline range and patient has good I/O. - Will continue to monitor closely via BMP's.  - If worsens will consider further work up and possible Renal consultation.  # Anemia  - Hb 11.6 on admission.  - Anemia panel unremarkable. Anemia likely secondary to poor renal function. - Will continue to monitor closely   FEN/GI: NS @ 9mL/hr, Clear liquid diet (advance as tolerated) Prophylaxis: Heparin SQ  Disposition: Pending clinical improvement and stabilization of BP  Code Status: full code  Thersa Salt, DO 09/27/2012, 7:19 AM

## 2012-09-27 NOTE — Progress Notes (Signed)
Paged MD on-call for Attending (Dr. Josephina Gip) to discuss fluid orders: NS@125ml /hr. Voiced concern about rate secondary to pt's admitting dx-Hypertensive Crisis, and secondary diagnosis of CKD (baseline Creatinine 3.5-4.0). Pt appears in no acute distress without signs/symptoms of dehydration and Urine Output has been adequate since admission having voided >1574mL. New order received to reduce fluid rate to 37ml/hr. Will continue to monitor and assess.

## 2012-09-28 DIAGNOSIS — I10 Essential (primary) hypertension: Secondary | ICD-10-CM

## 2012-09-28 LAB — BASIC METABOLIC PANEL
BUN: 26 mg/dL — ABNORMAL HIGH (ref 6–23)
Chloride: 102 mEq/L (ref 96–112)
Creatinine, Ser: 3.5 mg/dL — ABNORMAL HIGH (ref 0.50–1.35)
GFR calc non Af Amer: 22 mL/min — ABNORMAL LOW (ref >90)
Glucose, Bld: 115 mg/dL — ABNORMAL HIGH (ref 70–99)
Potassium: 3.7 mEq/L (ref 3.5–5.1)

## 2012-09-28 LAB — DRUGS OF ABUSE SCREEN W/O ALC, ROUTINE URINE
Barbiturate Quant, Ur: NEGATIVE
Cocaine Metabolites: NEGATIVE
Creatinine,U: 130.6 mg/dL
Opiate Screen, Urine: POSITIVE — AB
Phencyclidine (PCP): NEGATIVE
Propoxyphene: NEGATIVE

## 2012-09-28 LAB — CBC
HCT: 31.5 % — ABNORMAL LOW (ref 39.0–52.0)
Hemoglobin: 10.4 g/dL — ABNORMAL LOW (ref 13.0–17.0)
MCHC: 33 g/dL (ref 30.0–36.0)
MCV: 65.5 fL — ABNORMAL LOW (ref 78.0–100.0)

## 2012-09-28 LAB — GLUCOSE, CAPILLARY: Glucose-Capillary: 107 mg/dL — ABNORMAL HIGH (ref 70–99)

## 2012-09-28 MED ORDER — CARVEDILOL 25 MG PO TABS: 25.0000 mg | ORAL_TABLET | Freq: Two times a day (BID) | ORAL | Status: DC

## 2012-09-28 MED ORDER — METOCLOPRAMIDE HCL 5 MG/ML IJ SOLN
10.0000 mg | Freq: Three times a day (TID) | INTRAMUSCULAR | Status: DC | PRN
  Filled 2012-09-28: qty 2

## 2012-09-28 MED ORDER — TRAMADOL HCL 50 MG PO TABS: 50.0000 mg | ORAL_TABLET | Freq: Three times a day (TID) | ORAL | Status: DC | PRN

## 2012-09-28 MED ORDER — TRAMADOL HCL 50 MG PO TABS
50.0000 mg | ORAL_TABLET | Freq: Three times a day (TID) | ORAL | Status: DC | PRN
  Administered 2012-09-28 (×2): 50 mg via ORAL
  Filled 2012-09-28 (×2): qty 1

## 2012-09-28 MED ORDER — ONDANSETRON HCL 4 MG PO TABS: 4.0000 mg | ORAL_TABLET | Freq: Four times a day (QID) | ORAL | Status: DC | PRN

## 2012-09-28 MED ORDER — HYDRALAZINE HCL 50 MG PO TABS
50.0000 mg | ORAL_TABLET | Freq: Four times a day (QID) | ORAL | Status: DC
  Administered 2012-09-28 – 2012-09-29 (×5): 50 mg via ORAL
  Filled 2012-09-28 (×8): qty 1

## 2012-09-28 MED ORDER — CLONIDINE HCL 0.3 MG PO TABS: 0.3000 mg | ORAL_TABLET | Freq: Three times a day (TID) | ORAL | Status: DC

## 2012-09-28 MED ORDER — HYDRALAZINE HCL 100 MG PO TABS: 100.0000 mg | ORAL_TABLET | Freq: Three times a day (TID) | ORAL | Status: DC

## 2012-09-28 MED ORDER — ISOSORBIDE MONONITRATE ER 60 MG PO TB24: 120.0000 mg | ORAL_TABLET | Freq: Every day | ORAL | Status: DC

## 2012-09-28 MED ORDER — LISINOPRIL 40 MG PO TABS: 40.0000 mg | ORAL_TABLET | Freq: Two times a day (BID) | ORAL | Status: DC

## 2012-09-28 MED ORDER — NIFEDIPINE ER 60 MG PO TB24: 60.0000 mg | ORAL_TABLET | Freq: Two times a day (BID) | ORAL | Status: DC

## 2012-09-28 NOTE — Progress Notes (Signed)
Family Medicine Teaching Service Daily Progress Note Service Page: [phone]  Subjective:  Feeling much improved and eager to eat.  He denies any nausea/vomiting.  Last episode of emesis was yesterday am.  Continues to have mild/moderate headache.  Objective: Temp:  [97 F (36.1 C)-98.4 F (36.9 C)] 98.4 F (36.9 C) (05/06 0800) Pulse Rate:  [69-83] 77 (05/06 0800) Resp:  [13-28] 23 (05/06 0800) BP: (113-158)/(64-89) 155/87 mmHg (05/06 0800) SpO2:  [98 %-100 %] 98 % (05/06 0800)   Intake/Output Summary (Last 24 hours) at 09/28/12 0955 Last data filed at 09/28/12 0800  Gross per 24 hour  Intake   2685 ml  Output   3625 ml  Net   -940 ml   Exam: General: resting comfortably in bed this am, NAD. Cardiovascular: RRR. No rmurmurs, rubs, or gallops. Respiratory: CTAB. No rales, rhonchi, or wheezing. Abdomen: soft, nontender, nondistended, no organomegaly. Extremities: no LE edema.  CBC BMET   Recent Labs Lab 09/27/12 0443 09/27/12 0818 09/28/12 0500  WBC 15.1* 17.0* 13.4*  HGB 9.6* 10.4* 10.4*  HCT 29.4* 30.2* 31.5*  PLT 199 204 227    Recent Labs Lab 09/26/12 1250 09/27/12 0443 09/28/12 0500  NA 139 141 136  K 4.3 4.0 3.7  CL 105 107 102  CO2 22 23 21   BUN 35* 34* 26*  CREATININE 3.66* 3.90* 3.50*  GLUCOSE 142* 135* 115*  CALCIUM 9.1 8.8 9.0     Lab Results  Component Value Date   HGBA1C 5.6 09/26/2012   Imaging/Diagnostic Tests:  Ct Head Wo Contrast  09/26/2012 *RADIOLOGY REPORT* Clinical Data: Hypertension, nausea, emesis. CT HEAD WITHOUT CONTRAST Technique: Contiguous axial images were obtained from the base of the skull through the vertex without contrast. Comparison: None. Findings: Subtle white matter hypoattenuation is suggested, more pronounced within the posterior centrum semiovale right greater than left. There is no evidence for acute hemorrhage, hydrocephalus, mass lesion, or abnormal extra-axial fluid collection. No definite CT evidence for acute  infarction. The visualized paranasal sinuses and mastoid air cells are predominately clear. IMPRESSION: Nonspecific mild white matter hypoattenuation as above. MR has increased sensitivity and specificity for posterior reversible encephalopathy syndrome (PRES) if clinically warranted.   Assessment/Plan: # Hypertensive Emergency - Etiology presumed secondary to not taking any of his six PO HTN meds in past 7 days; No evidence of secondary cause of HTN at this point  - BP now markedly improved. - Will continue Clonidine 0.3 mg TID, Coreg 25 mg BID, Imdur 120 mg daily, Lisinopril 40 mg BID and Procardia 60 mg BID.  - Hydralazine increased to 50 mg QID. - Proceeding with workup for secondary causes of HTN.  Awaiting urine catecholamines/metanephrines, Aldosterone/Renin & Ratio.  Preliminary read on [place] reports no signs of renal artery stenosis. - CT scan obtained in the ED also concerned for PRES.  BP now markedly improved and patient neurologically intact.  Will consider MRI imaging if BP spikes or patient worsens clinically.   # Dehydration secondary to Nausea/vomiting and poor PO intake - Etiology unclear.  - Now resolved.  - Will continue Zofran PRN. Reglan changed back to PRN.  # Leukocytosis - Patient WBC improving - 17.0 --> 13.4  - Will monitor closely. Patient remains afebrile.  # DM - A1C 5.6 - Will continue CBG checks TID AC and QHS - CBG's well controlled. Will continue Sensitive SSI.  # CKD - Baseline Creatinine 3.4 - 4.0.  - Will continue to monitor closely via BMP's.  - If worsens  will consider further work up and possible Renal consultation.  # Anemia  - Hb 11.6 on admission.  - Anemia panel unremarkable. Anemia likely secondary to poor renal function. - Will continue to monitor closely   FEN/GI:  Prophylaxis: Heparin SQ  Disposition: Pending clinical improvement and stabilization of BP  Code Status: full code  Thersa Salt, DO 09/28/2012, 9:55 AM

## 2012-09-28 NOTE — Discharge Summary (Signed)
[facility] Discharge Summary  Patient name: Joshua [name] Medical record number: [id number] Date of birth: [birth year] Age: 32 y.o. Gender: male Date of Admission: 09/26/2012  Date of Discharge: 09/29/12 Admitting Physician: Willeen Niece, MD  Primary Care Provider: Provider Not In System  Indication for Hospitalization: Severe HTN, Nausea, vomiting  Discharge Diagnoses:  Principal Problem:   Hypertensive emergency Active Problems:   Dehydration   Chronic kidney disease (CKD), stage IV (severe)   Nausea and vomiting   Anemia in chronic kidney disease   Leukocytosis, unspecified  Brief Hospital Course:  32 y.o. year old male with PMH of DM, CKD, and poorly controlled HTN who presents with nausea/vomiting, dehydration, and Hypertensive emergency.  1) Hypertensive emergency - Etiology presumed secondary to not taking any of his six PO HTN meds in past 7 days - Patient presented with severe HTN as high as 252/182 with nausea/vomiting and headache - Patient was treated in the ED with Labetalol and Hydralazine.  Patient was even started on Nipride gtt for 1 hour and 15 minutes.   - Patient was admitted to step down and home medications were restarted.  Coreg, Imdur, and Procardia were continued at home doses.  Clonidine was increased to 0.3 mg TID.  Hydralazine was initially IV, but was later changed to PO 50 mg QID.  Lisinopril was decreased to 40 mg daily.  - Given severity of HTN, workup for secondary causes was started during admission.  Renal artery duplex was negative.  Urine catecholamines/metanephrines were obtained to evaluate for possible pheochromocytoma. Renin/Aldosterone w/ ratio was also obtained for evaluation of hyperaldosteronism.  These studies were in process at discharge. - BP improved markedly during admission and was 139/87 prior to D/C.   2) Dehydration secondary to Nausea/vomiting and poor PO intake  - Patient was hydrated with IV fluids  during admission.   - Nausea/vomiting resolved following IV fluids, zofran, and Reglan  3) DM - A1C 5.6. - Patient was placed on Sensitive SSI during admission.  4) CKD - stage 4 - Baseline Creatinine 3.4 - 4.0.  - Creatinine remained stable during admission. - Patient will benefit from outpatient renal evaluation  5) Anemia - Hb 11.6 on admission - Anemia panel was obtained and was unremarkable. - Hb remained stable.    6) Leukocytosis - Patient had elevated WBC count during admission, but had no other signs of underlying infection. - Patient remained afebrile during admission and WBC improved as seen below.  Significant Labs and Imaging:   CBC BMET   Recent Labs Lab 09/27/12 0818 09/28/12 0500 09/29/12 0430  WBC 17.0* 13.4* 11.6*  HGB 10.4* 10.4* 10.2*  HCT 30.2* 31.5* 29.8*  PLT 204 227 234    Recent Labs Lab 09/27/12 0443 09/28/12 0500 09/29/12 0430  NA 141 136 135  K 4.0 3.7 3.7  CL 107 102 100  CO2 23 21 23   BUN 34* 26* 24*  CREATININE 3.90* 3.50* 3.46*  GLUCOSE 135* 115* 121*  CALCIUM 8.8 9.0 8.3*     Ct Head Wo Contrast  09/26/2012 *RADIOLOGY REPORT* Clinical Data: Hypertension, nausea, emesis. CT HEAD WITHOUT CONTRAST Technique: Contiguous axial images were obtained from the base of the skull through the vertex without contrast. Comparison: None. Findings: Subtle white matter hypoattenuation is suggested, more pronounced within the posterior centrum semiovale right greater than left. There is no evidence for acute hemorrhage, hydrocephalus, mass lesion, or abnormal extra-axial fluid collection. No definite CT evidence for acute infarction. The visualized  paranasal sinuses and mastoid air cells are predominately clear. IMPRESSION: Nonspecific mild white matter hypoattenuation as above. MR has increased sensitivity and specificity for posterior reversible encephalopathy syndrome (PRES) if clinically warranted.   Renal artery Duplex  Summary: Technically  difficult and limited due to excess overlying bowel gas. Could not visualize aorta or renal artery origins. No obvious evidence of renal artery stenosis. Elevated right intrarenal resistive indices.   Lab Results  Component Value Date   HGBA1C 5.6 09/26/2012   Iron/TIBC/Ferritin    Component Value Date/Time   IRON 48 09/26/2012 1250   TIBC 303 09/26/2012 1250   FERRITIN 173 09/26/2012 1250   Procedures: None  Consultations: None  Discharge Medications:    Medication List    STOP taking these medications       glyBURIDE 5 MG tablet  Commonly known as:  DIABETA     NIFEdipine 60 MG 24 hr tablet  Commonly known as:  PROCARDIA XL/ADALAT-CC      TAKE these medications       amLODipine 10 MG tablet  Commonly known as:  NORVASC  Take 1 tablet (10 mg total) by mouth daily.     aspirin 325 MG tablet  Take 325 mg by mouth daily.     carvedilol 25 MG tablet  Commonly known as:  COREG  Take 1 tablet (25 mg total) by mouth 2 (two) times daily.     carvedilol 25 MG tablet  Commonly known as:  COREG  Take 1 tablet (25 mg total) by mouth 2 (two) times daily with a meal.     cloNIDine 0.3 MG tablet  Commonly known as:  CATAPRES  Take 1 tablet (0.3 mg total) by mouth 3 (three) times daily.     ferrous sulfate 325 (65 FE) MG tablet  Take 325 mg by mouth 2 (two) times daily.     gabapentin 100 MG capsule  Commonly known as:  NEURONTIN  Take 3 capsules (300 mg total) by mouth 3 (three) times daily.     hydrALAZINE 100 MG tablet  Commonly known as:  APRESOLINE  Take 1 tablet (100 mg total) by mouth 3 (three) times daily.     isosorbide mononitrate 60 MG 24 hr tablet  Commonly known as:  IMDUR  Take 2 tablets (120 mg total) by mouth daily.     lisinopril 40 MG tablet  Commonly known as:  PRINIVIL,ZESTRIL  Take 1 tablet (40 mg total) by mouth 2 (two) times daily.     ondansetron 4 MG tablet  Commonly known as:  ZOFRAN  Take 1 tablet (4 mg total) by mouth every 6 (six) hours  as needed for nausea.     traMADol 50 MG tablet  Commonly known as:  ULTRAM  Take 1 tablet (50 mg total) by mouth every 8 (eight) hours as needed.       Issues for Follow Up:  1) Patient needs close and regular follow up regarding HTN 2) Consider additional work up for secondary causes of HTN 3) Please refer pat  Outstanding Results: Aldosterone/Renin w/ ratio; Urine catecholamines and metanephrines.  Discharge Instructions:  Patient was counseled important signs and symptoms that should prompt return to medical care, changes in medications, dietary instructions, activity restrictions, and follow up appointments.   Follow-up Information   Schedule an appointment as soon as possible for a visit with HOPPER,DAVID, MD.   Contact information:   [address] [place] [id number] [phone]  Discharge Condition: Stable. Discharged home.  Thersa Salt, DO 09/29/2012, 10:16 PM

## 2012-09-28 NOTE — Progress Notes (Signed)
*  PRELIMINARY RESULTS* Vascular Ultrasound Renal Artery Duplex has been completed.   Technically limited due to excess overlying bowel gas. Could not evaluate aorta or renal artery origins.  However, there is no obvious evidence of renal artery stenosis bilaterally.  Landry Mellow, RDMS, RVT  09/28/2012, 9:36 AM

## 2012-09-28 NOTE — Progress Notes (Signed)
Report called to Judson Roch, receiving RN on 3W.  Pt transferred to 3W04 via wheelchair with all belongings. Pt to call family with new room number.     Vista Lawman, RN

## 2012-09-28 NOTE — Progress Notes (Signed)
FMTS Attending Admission Note: Jacquel Redditt,MD I  have seen and examined this patient, reviewed their chart. I have discussed this patient with the resident. I agree with the resident's findings, assessment and care plan.  

## 2012-09-29 DIAGNOSIS — R112 Nausea with vomiting, unspecified: Secondary | ICD-10-CM

## 2012-09-29 DIAGNOSIS — I161 Hypertensive emergency: Secondary | ICD-10-CM

## 2012-09-29 DIAGNOSIS — D72829 Elevated white blood cell count, unspecified: Secondary | ICD-10-CM

## 2012-09-29 DIAGNOSIS — N186 End stage renal disease: Secondary | ICD-10-CM

## 2012-09-29 DIAGNOSIS — N189 Chronic kidney disease, unspecified: Secondary | ICD-10-CM

## 2012-09-29 DIAGNOSIS — E860 Dehydration: Secondary | ICD-10-CM

## 2012-09-29 LAB — BASIC METABOLIC PANEL
BUN: 24 mg/dL — ABNORMAL HIGH (ref 6–23)
Creatinine, Ser: 3.46 mg/dL — ABNORMAL HIGH (ref 0.50–1.35)
GFR calc Af Amer: 26 mL/min — ABNORMAL LOW (ref >90)
GFR calc non Af Amer: 22 mL/min — ABNORMAL LOW (ref >90)

## 2012-09-29 LAB — CBC
HCT: 29.8 % — ABNORMAL LOW (ref 39.0–52.0)
MCHC: 34.2 g/dL (ref 30.0–36.0)
MCV: 65.1 fL — ABNORMAL LOW (ref 78.0–100.0)
RDW: 25.7 % — ABNORMAL HIGH (ref 11.5–15.5)

## 2012-09-29 MED ORDER — TRAMADOL HCL 50 MG PO TABS: 50.0000 mg | ORAL_TABLET | Freq: Three times a day (TID) | ORAL | Status: DC | PRN

## 2012-09-29 MED ORDER — AMLODIPINE BESYLATE 10 MG PO TABS: 10.0000 mg | ORAL_TABLET | Freq: Every day | ORAL | Status: DC

## 2012-09-29 NOTE — Progress Notes (Signed)
FMTS Attending Admission Note: Joshua Winkles,MD I  have seen and examined this patient, reviewed their chart. I have discussed this patient with the resident. I agree with the resident's findings, assessment and care plan.  

## 2012-09-29 NOTE — Progress Notes (Signed)
Met with pt to give him a list of shelters and Hot meals. CSW will call financial counseling to let them know that the pt will stop by tomorrow to talk about his medical bill. No other needs at this time.   Danise Edge, [place] Social Worker [phone]

## 2012-09-29 NOTE — Care Management Note (Addendum)
  Page 1 of 1   09/29/2012     1:27:00 PM   CARE MANAGEMENT NOTE 09/29/2012  Patient:  Joshua [name], Joshua [name]   Account Number:  [id number]  Date Initiated:  09/29/2012  Documentation initiated by:  GRAVES-BIGELOW,Lysa Livengood  Subjective/Objective Assessment:   Pt admitted for HTN urgency. Pt is from out of town and has an Interior and spatial designer here in [place]. Pt states he is staying with some friends for now and needs some resources for housing. CSW will assist with resources.     Action/Plan:   CM will do [place] program for medication assist. Pt states he will be able to afford the $3.00 copay for Rx at this time. CM did call the MD to see if could change procardia to cheaper medication. Pt will f/u at [facility] and FM can f/u from there. Per MD Lacinda Axon they can help with meds after d/c.   Anticipated DC Date:  09/29/2012   Anticipated DC Plan:  HOME/SELF CARE  In-house referral  Financial Counselor  Clinical Social Worker      DC Planning Services  CM consult      Choice offered to / List presented to:             Status of service:  Completed, signed off Medicare Important Message given?   (If response is "NO", the following Medicare IM given date fields will be blank) Date Medicare IM given:   Date Additional Medicare IM given:    Discharge Disposition:  HOME/SELF CARE  Per UR Regulation:  Reviewed for med. necessity/level of care/duration of stay  If discussed at [place] of Stay Meetings, dates discussed:    Comments:

## 2012-09-29 NOTE — Progress Notes (Signed)
Reviewed discharge instructions with patient and he stated her understanding.  Patient given prescriptions for match programs.  Discharged home via wheelchair with uncle.  Joshua [name]

## 2012-09-29 NOTE — Progress Notes (Signed)
Family Medicine Teaching Service Daily Progress Note Service Page: [phone]  Subjective:  Feeling much improved.  Minimal headache (1/10 in severity this am).  Tolerating carb modified diet and no reports of nausea/vomiting.  Objective: Temp:  [97.3 F (36.3 C)-97.8 F (36.6 C)] 97.3 F (36.3 C) (05/07 0529) Pulse Rate:  [65-72] 65 (05/07 0529) Resp:  [14-18] 18 (05/07 0529) BP: (125-164)/(72-106) 136/83 mmHg (05/07 0529) SpO2:  [98 %-100 %] 100 % (05/07 0529) Weight:  [237 lb (107.502 kg)] 237 lb (107.502 kg) (05/06 1159)   Intake/Output Summary (Last 24 hours) at 09/29/12 0911 Last data filed at 09/29/12 0500  Gross per 24 hour  Intake   1150 ml  Output   1550 ml  Net   -400 ml   Exam: General: resting comfortably in bed this am, NAD. Cardiovascular: RRR. No rmurmurs, rubs, or gallops. Respiratory: CTAB. No rales, rhonchi, or wheezing. Abdomen: soft, nontender, nondistended, no organomegaly. Extremities: no LE edema.  CBC BMET   Recent Labs Lab 09/27/12 0818 09/28/12 0500 09/29/12 0430  WBC 17.0* 13.4* 11.6*  HGB 10.4* 10.4* 10.2*  HCT 30.2* 31.5* 29.8*  PLT 204 227 234    Recent Labs Lab 09/27/12 0443 09/28/12 0500 09/29/12 0430  NA 141 136 135  K 4.0 3.7 3.7  CL 107 102 100  CO2 23 21 23   BUN 34* 26* 24*  CREATININE 3.90* 3.50* 3.46*  GLUCOSE 135* 115* 121*  CALCIUM 8.8 9.0 8.3*     Lab Results  Component Value Date   HGBA1C 5.6 09/26/2012   Imaging/Diagnostic Tests:  Ct Head Wo Contrast  09/26/2012 *RADIOLOGY REPORT* Clinical Data: Hypertension, nausea, emesis. CT HEAD WITHOUT CONTRAST Technique: Contiguous axial images were obtained from the base of the skull through the vertex without contrast. Comparison: None. Findings: Subtle white matter hypoattenuation is suggested, more pronounced within the posterior centrum semiovale right greater than left. There is no evidence for acute hemorrhage, hydrocephalus, mass lesion, or abnormal extra-axial  fluid collection. No definite CT evidence for acute infarction. The visualized paranasal sinuses and mastoid air cells are predominately clear. IMPRESSION: Nonspecific mild white matter hypoattenuation as above. MR has increased sensitivity and specificity for posterior reversible encephalopathy syndrome (PRES) if clinically warranted.   Renal artery Duplex  Summary: Technically difficult and limited due to excess overlying bowel gas. Could not visualize aorta or renal artery origins. No obvious evidence of renal artery stenosis.  Elevated right intrarenal resistive indices.  Assessment/Plan: # Hypertensive Emergency - Etiology presumed secondary to not taking any of his six PO HTN meds in past 7 days; No evidence of secondary cause of HTN at this point  - BP 136/83 this am. - Will continue Clonidine 0.3 mg TID, Coreg 25 mg BID, Imdur 120 mg daily, Lisinopril 40 mg BID, Procardia 60 mg BID, Hydralazine 50 QID. - Proceeding with workup for secondary causes of HTN.  Awaiting urine catecholamines/metanephrines, Aldosterone/Renin & Ratio.  Renal ultrasound revealed no obvious evidence of renal artery stenosis.  # Dehydration secondary to Nausea/vomiting and poor PO intake - Etiology unclear.  - Now resolved.   # Leukocytosis - Patient WBC improving - 17.0 --> 13.4 --> 11.6 - Will monitor closely. Patient remains afebrile.  # DM - A1C 5.6 - Will continue CBG checks TID AC and QHS - CBG's well controlled. Will continue Sensitive SSI.  # CKD - Baseline Creatinine 3.4 - 4.0.  - Will continue to monitor closely via BMP's.  - If worsens will consider further work up  and possible Renal consultation. - patient will benefit from outpatient renal evaluation  # Anemia  - Hb 11.6 on admission.  - Anemia panel unremarkable. Anemia likely secondary to poor renal function. - Will continue to monitor closely   FEN/GI:  Prophylaxis: Heparin SQ  Disposition: Planned D/C home Today. Code Status: full  code  Thersa Salt, DO 09/29/2012, 9:11 AM

## 2012-09-30 NOTE — Discharge Summary (Signed)
FMTS Attending Admission Note: Kehinde Eniola,MD I  have seen and examined this patient, reviewed their chart. I have discussed this patient with the resident. I agree with the resident's findings, assessment and care plan.  

## 2012-10-01 LAB — OPIATE, QUANTITATIVE, URINE
Codeine Urine: NEGATIVE ng/mL
Hydrocodone: NEGATIVE ng/mL
Morphine, Confirm: 672 ng/mL
Norhydrocodone, Ur: NEGATIVE ng/mL
Oxymorphone: NEGATIVE ng/mL

## 2012-10-01 LAB — METANEPHRINES, URINE, 24 HOUR
Metanephrines, Ur: 285 mcg/24 h — ABNORMAL HIGH (ref 36–190)
Volume, Urine-METAN: 4950 mL

## 2012-10-02 ENCOUNTER — Telehealth: Payer: Self-pay | Admitting: Family Medicine

## 2012-10-02 LAB — CATECHOLAMINES, FRACTIONATED, URINE, 24 HOUR
Creatinine, Urine mg/day-CATEUR: 1.56 g/(24.h) (ref 0.63–2.50)
Dopamine 24 Hr Urine: 99 mcg/24 h (ref 52–480)
Norepinephrine 24 Hr Urine: 20 mcg/24 h (ref 15–100)
Total urine volume: 4950 mL

## 2012-10-02 NOTE — Telephone Encounter (Signed)
I call patient about his Metanephrine test result,message left for him to reach [place].

## 2012-10-02 NOTE — Hospital Discharge Follow-Up (Signed)
Received a call from the resident at [facility] hospital stating that the patient had just been discharged yesterday. A urine metanephrine level came back high, and they're concerned about the possibility of pheochromocytoma. They're telling him to come back and see me.

## 2012-10-02 NOTE — Telephone Encounter (Signed)
Per Dr. Linna Darner, Patient needs to come in this week and see him. Called patient and unable toleave message, mailbox full.

## 2012-10-04 ENCOUNTER — Telehealth: Payer: Self-pay | Admitting: Family Medicine

## 2012-10-04 ENCOUNTER — Encounter: Payer: Self-pay | Admitting: Family Medicine

## 2012-10-04 NOTE — Telephone Encounter (Signed)
Called him to advise Dr Linna Darner wants to see him in follow up, he states he will come in this week, to you [facility]

## 2012-10-04 NOTE — Telephone Encounter (Signed)
Multiple attempt made to reach patient via phone to discuss test result( Metanephrine). I also suggested to Dr Lacinda Axon to call patient as well. At this time,I will mail a result letter to his home and forward result to his PMD at [facility] as well.

## 2012-10-04 NOTE — Telephone Encounter (Signed)
Good. I had gotten a note from the hospital physician saying they were unable to reach him. I'm going to give him the message.

## 2012-10-04 NOTE — Telephone Encounter (Signed)
Hello Dr Linna Darner,  Joshua [name] was recently discharged from our service for HTN emergency for which we decided to screen him for secondary cause of his HTN. I just want to inform you that his urine metanephrine level is about 2-4 times above normal value, he need to get plasma metanephrine level checked and or obtain Adrenal imaging to r/o tumor. I had tried to call patient multiple times with no success in reaching him. Please check plasma Metanephrine at his next visit to you,if you have any question do not hesitate to contact me.  [facility]  Letter forwarded to Dr Linna Darner on 10/04/12.

## 2012-10-08 ENCOUNTER — Telehealth: Payer: Self-pay | Admitting: Family Medicine

## 2012-10-08 LAB — ALDOSTERONE + RENIN ACTIVITY W/ RATIO: Aldosterone: 6 ng/dL

## 2012-10-08 NOTE — Telephone Encounter (Signed)
I called and discussed with patient today about his urine Metanephrin report and need to get a serum test and possibly a CT abdomen,he stated he has f/u appointment with Dr Linna Darner next Monday, I already sent a message to Dr Linna Darner about this test and also encouraged patient to remind him to test report and need to follow up on it. Patient verbalized understanding.  This message also forwarded to Dr Linna Darner.

## 2012-10-12 NOTE — Telephone Encounter (Signed)
Call patient.    I cannot tell that the patient was seen here or elsewhere.  I do not take appointments, but recommend they get seen soon here or elsewhere.

## 2012-10-12 NOTE — Telephone Encounter (Signed)
I called him. He needs follow up with Dr Linna Darner this week. He was here last month. He states he just woke up and will call back to check Dr Darrol Angel hours this week.

## 2012-12-07 ENCOUNTER — Inpatient Hospital Stay (HOSPITAL_COMMUNITY)
Admission: EM | Admit: 2012-12-07 | Discharge: 2012-12-10 | DRG: 683 | Disposition: A | Discharge status: Home / Self Care | Payer: Medicaid Other | Attending: Family Medicine | Admitting: Family Medicine

## 2012-12-07 ENCOUNTER — Encounter (HOSPITAL_COMMUNITY): Payer: Self-pay | Admitting: *Deleted

## 2012-12-07 DIAGNOSIS — Z7982 Long term (current) use of aspirin: Secondary | ICD-10-CM

## 2012-12-07 DIAGNOSIS — I10 Essential (primary) hypertension: Secondary | ICD-10-CM

## 2012-12-07 DIAGNOSIS — E119 Type 2 diabetes mellitus without complications: Secondary | ICD-10-CM

## 2012-12-07 DIAGNOSIS — H538 Other visual disturbances: Secondary | ICD-10-CM | POA: Diagnosis present

## 2012-12-07 DIAGNOSIS — N184 Chronic kidney disease, stage 4 (severe): Secondary | ICD-10-CM | POA: Diagnosis present

## 2012-12-07 DIAGNOSIS — K3184 Gastroparesis: Secondary | ICD-10-CM | POA: Diagnosis present

## 2012-12-07 DIAGNOSIS — N186 End stage renal disease: Secondary | ICD-10-CM | POA: Diagnosis present

## 2012-12-07 DIAGNOSIS — R112 Nausea with vomiting, unspecified: Secondary | ICD-10-CM

## 2012-12-07 DIAGNOSIS — R809 Proteinuria, unspecified: Secondary | ICD-10-CM | POA: Diagnosis present

## 2012-12-07 DIAGNOSIS — N039 Chronic nephritic syndrome with unspecified morphologic changes: Secondary | ICD-10-CM | POA: Diagnosis present

## 2012-12-07 DIAGNOSIS — D631 Anemia in chronic kidney disease: Secondary | ICD-10-CM | POA: Diagnosis present

## 2012-12-07 DIAGNOSIS — N058 Unspecified nephritic syndrome with other morphologic changes: Secondary | ICD-10-CM | POA: Diagnosis present

## 2012-12-07 DIAGNOSIS — N2581 Secondary hyperparathyroidism of renal origin: Secondary | ICD-10-CM | POA: Diagnosis present

## 2012-12-07 DIAGNOSIS — E860 Dehydration: Secondary | ICD-10-CM | POA: Diagnosis present

## 2012-12-07 DIAGNOSIS — Z6834 Body mass index (BMI) 34.0-34.9, adult: Secondary | ICD-10-CM

## 2012-12-07 DIAGNOSIS — I129 Hypertensive chronic kidney disease with stage 1 through stage 4 chronic kidney disease, or unspecified chronic kidney disease: Principal | ICD-10-CM | POA: Diagnosis present

## 2012-12-07 DIAGNOSIS — D509 Iron deficiency anemia, unspecified: Secondary | ICD-10-CM | POA: Diagnosis present

## 2012-12-07 DIAGNOSIS — E669 Obesity, unspecified: Secondary | ICD-10-CM | POA: Diagnosis present

## 2012-12-07 DIAGNOSIS — I161 Hypertensive emergency: Secondary | ICD-10-CM | POA: Diagnosis present

## 2012-12-07 HISTORY — DX: Anemia in other chronic diseases classified elsewhere: D63.8

## 2012-12-07 HISTORY — DX: Headache: R51

## 2012-12-07 HISTORY — DX: Sleep apnea, unspecified: G47.30

## 2012-12-07 HISTORY — DX: Type 2 diabetes mellitus without complications: E11.9

## 2012-12-07 LAB — GLUCOSE, CAPILLARY: Glucose-Capillary: 127 mg/dL — ABNORMAL HIGH (ref 70–99)

## 2012-12-07 LAB — CBC WITH DIFFERENTIAL/PLATELET
Basophils Relative: 0 % (ref 0–1)
HCT: 30.5 % — ABNORMAL LOW (ref 39.0–52.0)
Hemoglobin: 10.4 g/dL — ABNORMAL LOW (ref 13.0–17.0)
Lymphs Abs: 1 10*3/uL (ref 0.7–4.0)
MCH: 24.1 pg — ABNORMAL LOW (ref 26.0–34.0)
MCHC: 34.1 g/dL (ref 30.0–36.0)
Monocytes Absolute: 0.5 10*3/uL (ref 0.1–1.0)
Monocytes Relative: 4 % (ref 3–12)
Neutro Abs: 12.3 10*3/uL — ABNORMAL HIGH (ref 1.7–7.7)

## 2012-12-07 LAB — HEMOGLOBIN A1C
Hgb A1c MFr Bld: 5.5 % (ref <5.7)
Mean Plasma Glucose: 111 mg/dL (ref <117)

## 2012-12-07 LAB — COMPREHENSIVE METABOLIC PANEL
Albumin: 3.5 g/dL (ref 3.5–5.2)
BUN: 39 mg/dL — ABNORMAL HIGH (ref 6–23)
Chloride: 104 mEq/L (ref 96–112)
Creatinine, Ser: 3.58 mg/dL — ABNORMAL HIGH (ref 0.50–1.35)
GFR calc Af Amer: 25 mL/min — ABNORMAL LOW (ref >90)
GFR calc non Af Amer: 21 mL/min — ABNORMAL LOW (ref >90)
Glucose, Bld: 124 mg/dL — ABNORMAL HIGH (ref 70–99)
Total Bilirubin: 0.6 mg/dL (ref 0.3–1.2)

## 2012-12-07 LAB — URINALYSIS, ROUTINE W REFLEX MICROSCOPIC
Bilirubin Urine: NEGATIVE
Ketones, ur: NEGATIVE mg/dL
Leukocytes, UA: NEGATIVE
Nitrite: NEGATIVE
Protein, ur: 100 mg/dL — AB
Urobilinogen, UA: 0.2 mg/dL (ref 0.0–1.0)

## 2012-12-07 LAB — LIPASE, BLOOD: Lipase: 35 U/L (ref 11–59)

## 2012-12-07 MED ORDER — GABAPENTIN 300 MG PO CAPS
300.0000 mg | ORAL_CAPSULE | Freq: Three times a day (TID) | ORAL | Status: DC
  Administered 2012-12-07 – 2012-12-10 (×8): 300 mg via ORAL
  Filled 2012-12-07 (×10): qty 1

## 2012-12-07 MED ORDER — ASPIRIN EC 81 MG PO TBEC
81.0000 mg | DELAYED_RELEASE_TABLET | Freq: Every day | ORAL | Status: DC
  Administered 2012-12-07 – 2012-12-10 (×4): 81 mg via ORAL
  Filled 2012-12-07 (×4): qty 1

## 2012-12-07 MED ORDER — LABETALOL HCL 5 MG/ML IV SOLN
20.0000 mg | Freq: Once | INTRAVENOUS | Status: AC
  Administered 2012-12-07: 20 mg via INTRAVENOUS
  Filled 2012-12-07: qty 4

## 2012-12-07 MED ORDER — CLONIDINE HCL 0.3 MG PO TABS
0.3000 mg | ORAL_TABLET | Freq: Three times a day (TID) | ORAL | Status: DC
  Administered 2012-12-07 – 2012-12-10 (×9): 0.3 mg via ORAL
  Filled 2012-12-07 (×11): qty 1

## 2012-12-07 MED ORDER — ACETAMINOPHEN 325 MG PO TABS: 650.0000 mg | ORAL_TABLET | Freq: Four times a day (QID) | ORAL | Status: DC | PRN

## 2012-12-07 MED ORDER — HEPARIN SODIUM (PORCINE) 5000 UNIT/ML IJ SOLN
5000.0000 [IU] | Freq: Three times a day (TID) | INTRAMUSCULAR | Status: DC
  Administered 2012-12-07 – 2012-12-09 (×4): 5000 [IU] via SUBCUTANEOUS
  Filled 2012-12-07 (×11): qty 1

## 2012-12-07 MED ORDER — AMLODIPINE BESYLATE 10 MG PO TABS
10.0000 mg | ORAL_TABLET | Freq: Every day | ORAL | Status: DC
  Administered 2012-12-07 – 2012-12-10 (×4): 10 mg via ORAL
  Filled 2012-12-07 (×4): qty 1

## 2012-12-07 MED ORDER — SODIUM CHLORIDE 0.9 % IV SOLN
INTRAVENOUS | Status: DC
  Administered 2012-12-07 – 2012-12-08 (×2): via INTRAVENOUS

## 2012-12-07 MED ORDER — MORPHINE SULFATE 4 MG/ML IJ SOLN
4.0000 mg | INTRAMUSCULAR | Status: DC | PRN
  Administered 2012-12-08: 4 mg via INTRAVENOUS
  Filled 2012-12-07: qty 1

## 2012-12-07 MED ORDER — LISINOPRIL 40 MG PO TABS
40.0000 mg | ORAL_TABLET | Freq: Two times a day (BID) | ORAL | Status: DC
  Administered 2012-12-07 – 2012-12-08 (×4): 40 mg via ORAL
  Filled 2012-12-07 (×2): qty 1
  Filled 2012-12-07: qty 2
  Filled 2012-12-07 (×3): qty 1

## 2012-12-07 MED ORDER — INSULIN ASPART 100 UNIT/ML ~~LOC~~ SOLN: 0.0000 [IU] | Freq: Three times a day (TID) | SUBCUTANEOUS | Status: DC

## 2012-12-07 MED ORDER — HYDRALAZINE HCL 20 MG/ML IJ SOLN
10.0000 mg | Freq: Once | INTRAMUSCULAR | Status: AC
  Administered 2012-12-07: 10 mg via INTRAVENOUS
  Filled 2012-12-07: qty 1

## 2012-12-07 MED ORDER — SENNA 8.6 MG PO TABS
1.0000 | ORAL_TABLET | Freq: Two times a day (BID) | ORAL | Status: DC
  Administered 2012-12-07 – 2012-12-10 (×5): 8.6 mg via ORAL
  Filled 2012-12-07 (×7): qty 1

## 2012-12-07 MED ORDER — CARVEDILOL 25 MG PO TABS
25.0000 mg | ORAL_TABLET | Freq: Two times a day (BID) | ORAL | Status: DC
  Administered 2012-12-07 – 2012-12-10 (×7): 25 mg via ORAL
  Filled 2012-12-07 (×10): qty 1

## 2012-12-07 MED ORDER — FERROUS SULFATE 325 (65 FE) MG PO TABS
325.0000 mg | ORAL_TABLET | Freq: Two times a day (BID) | ORAL | Status: DC
  Administered 2012-12-07 – 2012-12-10 (×6): 325 mg via ORAL
  Filled 2012-12-07 (×7): qty 1

## 2012-12-07 MED ORDER — ISOSORBIDE MONONITRATE ER 60 MG PO TB24
120.0000 mg | ORAL_TABLET | Freq: Every day | ORAL | Status: DC
  Administered 2012-12-07 – 2012-12-10 (×4): 120 mg via ORAL
  Filled 2012-12-07 (×4): qty 2

## 2012-12-07 MED ORDER — MORPHINE SULFATE 4 MG/ML IJ SOLN
6.0000 mg | Freq: Once | INTRAMUSCULAR | Status: AC
  Administered 2012-12-07: 6 mg via INTRAVENOUS
  Filled 2012-12-07: qty 2

## 2012-12-07 MED ORDER — SODIUM CHLORIDE 0.9 % IJ SOLN
3.0000 mL | Freq: Two times a day (BID) | INTRAMUSCULAR | Status: DC
  Administered 2012-12-08 – 2012-12-09 (×3): 3 mL via INTRAVENOUS

## 2012-12-07 MED ORDER — ACETAMINOPHEN 650 MG RE SUPP: 650.0000 mg | Freq: Four times a day (QID) | RECTAL | Status: DC | PRN

## 2012-12-07 MED ORDER — ONDANSETRON HCL 4 MG/2ML IJ SOLN
4.0000 mg | Freq: Once | INTRAMUSCULAR | Status: AC
  Administered 2012-12-07: 4 mg via INTRAVENOUS
  Filled 2012-12-07: qty 2

## 2012-12-07 MED ORDER — HYDRALAZINE HCL 50 MG PO TABS
100.0000 mg | ORAL_TABLET | Freq: Three times a day (TID) | ORAL | Status: DC
  Administered 2012-12-07 – 2012-12-08 (×4): 100 mg via ORAL
  Filled 2012-12-07 (×8): qty 2

## 2012-12-07 MED ORDER — HYDRALAZINE HCL 100 MG PO TABS
100.0000 mg | ORAL_TABLET | Freq: Three times a day (TID) | ORAL | Status: DC
  Administered 2012-12-07: 100 mg via ORAL

## 2012-12-07 MED ORDER — ONDANSETRON HCL 4 MG/2ML IJ SOLN: 4.0000 mg | Freq: Four times a day (QID) | INTRAMUSCULAR | Status: DC | PRN

## 2012-12-07 NOTE — ED Notes (Signed)
CBG 142 per EMS

## 2012-12-07 NOTE — H&P (Signed)
[facility] Admission History and Physical Service Pager: [phone]  Patient name: Joshua [name] Medical record number: [id number] Date of birth: 12-01-1980 Age: 32 y.o. Gender: male  Primary Care Provider: [facility] - Not routinely seen there Consultants: Nephrology Code Status: Full Code  Chief Complaint: Headache & Nausea  Assessment and Plan: Joshua [name] is a 32 y.o. year old male presenting with headache & nausea/vomiting x5 days. PMH is significant for HTN, DM2 and CKD.  1. Hypertensive Emergency - presented with BP of 230/140 with no signs of end organ damage (Creatinine: 3.58 close to baseline). Associated with headache and blurry vision. Currently 138/80 after treatment in the ED. - Admit to Telemetry, attending Dr. Nori Riis. BP now stable, but if were to remain elevated would need transfer to SDU. - Continue home meds  - amlodipine 10mg  qD  - carvedilol 25mg  BID  - clonidine 0.3mg  TID  - hydralazine 10mg  qD  - Isosorbide mononitrate 120mg  qD  - lisinopril 40mg  BID [ ]  f/u troponin [ ]  f/u AM EKG - Vitals per floor protocol. Patient is being restarted on multiple medications. If his BP drops too much, we will need to cut back on his home meds.  2. Nausea & Vomiting. Non-bloody, non-bilious. Associated with eating. No known infection risk factors - start zofran 4mg  q4hrs - start full liquid diet, Advance slowly as tolerated [ ]  f/u bmet daily  3. Anemia of chronic disease, currently at 10.4 (baseline) - continuing home ferrous sulfate 325mg  BID [ ]  follow-up CBC  4. DM (type II)- Not on any medications as an outpatient. Last A1C 5.6 in May. - started on sensitive sliding scale insulin [ ]  f/u A1C  5. Difficult stools - Last BM this morning, however it was difficult. Currently on morphine PRN - start senna 8.6mg  BID  6. Neuro/Psych - continue home gabapentin 100mg  TID  7. CKD- Patient with known CKD, creatinine now  around baseline at 3.5. Interestingly, at last hospitalization urine studies were performed to evaluate for pheo and/or other tumors which could be contributing to his hypertension. His metanephrines returned at double the normal value, which may or may not be significant.  - Will consult renal for further recommendations about imaging of kidney/adrenals  - Also for BP control and CKD.  FEN/GI: Full liquid Prophylaxis: Heparin subq  Disposition: Pending tolerating po intake of food and po meds  History of Present Illness: Joshua [name] is a 32 y.o. year old male presenting with hypertensive urgency. Patient has history of emesis for past 5 days. Emesis is associated with oral intake of foods, drinks and medications and contains contents of ingested food. He also feels like he has an associated blood pressure increase. (Been unable to tolerate BP medications at home while vomiting.) He has nausea medication prescribed and uses it, which usually works, however, during this current episode, his nausea medication did not alleviate his symptoms. He has no alleviating factors. Has been trying to take his blood pressure medication, but keeps throwing it up. His last episode was this morning. He has no recent travel, no sick contacts, no diarrhea. He has a headache that started 5 days ago, coinciding with the start of his vomiting episode. He has no associated dizziness, no chest pain, no shortness of breath and no head injures. Headache has much improved with morphine. He had similar symptoms in may but was not able to keep his follow-up after hospitalization due to financial reasons.  He received amlodipine, carvedilol, clonidine, isosorbide mononitrate, as well as morphine in the ED with good improvement of his BP. Currently feeling somewhat improved.  Review Of Systems: Per HPI with the following additions:  Otherwise 12 point review of systems was performed and was unremarkable.  Patient Active Problem  List   Diagnosis Date Noted  . Dehydration 09/29/2012  . Hypertensive emergency 09/29/2012  . Chronic kidney disease (CKD), stage IV (severe) 09/29/2012  . Nausea and vomiting 09/29/2012  . Anemia in chronic kidney disease 09/29/2012  . Leukocytosis, unspecified 09/29/2012  . Diabetes 09/02/2012  . Diabetic kidney disease 09/02/2012  . HTN (hypertension), benign 09/02/2012   Past Medical History: Past Medical History  Diagnosis Date  . Arthritis   . Diabetes mellitus without complication   . Chronic kidney disease   . Hypertension   . Atrial fibrillation    Past Surgical History: History reviewed. No pertinent past surgical history. Social History: History  Substance Use Topics  . Smoking status: Never Smoker   . Smokeless tobacco: Not on file  . Alcohol Use: No   Additional social history:   Please also refer to relevant sections of EMR.  Family History: Family History  Problem Relation Age of Onset  . Diabetes Father   . Hypertension Father   . Hypertension Maternal Grandmother   . Hypertension Maternal Grandfather   . Kidney disease Maternal Grandfather   . Diabetes Maternal Grandfather    Allergies and Medications: No Known Allergies No current facility-administered medications on file prior to encounter.   Current Outpatient Prescriptions on File Prior to Encounter  Medication Sig Dispense Refill  . amLODipine (NORVASC) 10 MG tablet Take 1 tablet (10 mg total) by mouth daily.  30 tablet  3  . aspirin 325 MG tablet Take 325 mg by mouth daily.      . carvedilol (COREG) 25 MG tablet Take 1 tablet (25 mg total) by mouth 2 (two) times daily.  60 tablet  2  . carvedilol (COREG) 25 MG tablet Take 1 tablet (25 mg total) by mouth 2 (two) times daily with a meal.  60 tablet  3  . cloNIDine (CATAPRES) 0.3 MG tablet Take 1 tablet (0.3 mg total) by mouth 3 (three) times daily.  60 tablet  3  . ferrous sulfate 325 (65 FE) MG tablet Take 325 mg by mouth 2 (two) times  daily.      Marland Kitchen gabapentin (NEURONTIN) 100 MG capsule Take 3 capsules (300 mg total) by mouth 3 (three) times daily.  90 capsule  3  . hydrALAZINE (APRESOLINE) 100 MG tablet Take 1 tablet (100 mg total) by mouth 3 (three) times daily.  90 tablet  3  . isosorbide mononitrate (IMDUR) 60 MG 24 hr tablet Take 2 tablets (120 mg total) by mouth daily.  30 tablet  3  . lisinopril (PRINIVIL,ZESTRIL) 40 MG tablet Take 1 tablet (40 mg total) by mouth 2 (two) times daily.  30 tablet  3  . ondansetron (ZOFRAN) 4 MG tablet Take 1 tablet (4 mg total) by mouth every 6 (six) hours as needed for nausea.  20 tablet  0  . traMADol (ULTRAM) 50 MG tablet Take 1 tablet (50 mg total) by mouth every 8 (eight) hours as needed.  12 tablet  0    Objective: BP 190/119  Pulse 88  Temp(Src) 97.7 F (36.5 C) (Oral)  Resp 31  Ht 5\' 11"  (1.803 m)  Wt 240 lb (108.863 kg)  BMI 33.49 kg/m2  SpO2 97% Exam: General: Obese, Awake, answering questions. HEENT: Pupils reactive to light, 52mm bilaterally. Dry cracked lips  Cardiovascular: RRR, no murmurs Respiratory: CTAB, no wheeze Abdomen: Obese, non-distended, LUQ and LLQ tenderness, no guarding or rebound. Some striae on sides Extremities: no edema Skin: Legs and back have dark, dry, scaly skin Neuro: Alert & Oriented x3, no focal deficits   Labs and Imaging: CBC BMET   Recent Labs Lab 12/07/12 1150  WBC 13.8*  HGB 10.4*  HCT 30.5*  PLT 219    Recent Labs Lab 12/07/12 1150  NA 139  K 3.7  CL 104  CO2 20  BUN 39*  CREATININE 3.58*  GLUCOSE 124*  CALCIUM 9.5       12/07/2012 11:08  Color, Urine YELLOW  APPearance CLEAR  Specific Gravity, Urine 1.009  pH 7.0  Glucose NEGATIVE  Bilirubin Urine NEGATIVE  Ketones, ur NEGATIVE  Protein 100 (A)  Urobilinogen, UA 0.2  Nitrite NEGATIVE  Leukocytes, UA NEGATIVE  Hgb urine dipstick NEGATIVE  WBC, UA 0-2  Squamous Epithelial / LPF RARE  Bacteria, UA RARE     09/27/2012 11:54  Metaneph Total, Ur 1248  (H)  Metanephrines, Ur 285 (H)    Cordelia Poche, MD 12/07/2012, 2:00 PM PGY-1, [facility] pager: [phone], text pages welcome _________________________________________________  PGY-2 Addendum: I have seen and examined Joshua [name]. I agree with Dr. Lisbeth Ply note above with additional changes marked in purple. Charice Zuno M. Shanina Kepple, M.D. 12/07/2012 4:26 PM

## 2012-12-07 NOTE — ED Notes (Signed)
Patient states headache, located in front behind eyes, x 2 days with nausea and vomiting associated,

## 2012-12-07 NOTE — ED Provider Notes (Signed)
History    CSN: [id number] Arrival date & time 12/07/12  0945  First MD Initiated Contact with Patient 12/07/12 [phone]     Chief Complaint  Patient presents with  . Headache  . Nausea   (Consider location/radiation/quality/duration/timing/severity/associated sxs/prior Treatment) HPI Comments: pmhx HTN, DM, CKD, nausea and vomiting x 4-5 days, unable to tolerate meds 2/2 emesis. Multiple episodes per day, NBNB. Yesterday developed gradual onset bilateral frontal HA, not sudden onset or worst of life. A/w mild blurred vision and pho. No diplopia or unilateral weakness or numbness. Today developed mild LUQ and LLQ abd pain - constant - cramping. No dysuria/polyuria or hematuria. No fever. Denies diarrhea, constipation, melena or hematochezia. No dyspnea or chest pain     Patient is a 32 y.o. male presenting with general illness. The history is provided by the patient. No language interpreter was used.  Illness Location:  Global  Quality:  HTN, HA, emesis, abd pain Severity:  Severe Onset quality:  Gradual Duration:  5 days Timing:  Constant Progression:  Worsening Chronicity:  Recurrent Associated symptoms: abdominal pain, headaches, nausea and vomiting   Associated symptoms: no chest pain, no congestion, no cough, no diarrhea, no fever, no rash, no shortness of breath and no sore throat    Past Medical History  Diagnosis Date  . Arthritis   . Diabetes mellitus without complication   . Chronic kidney disease   . Hypertension   . Atrial fibrillation    History reviewed. No pertinent past surgical history. Family History  Problem Relation Age of Onset  . Diabetes Father   . Hypertension Father   . Hypertension Maternal Grandmother   . Hypertension Maternal Grandfather   . Kidney disease Maternal Grandfather   . Diabetes Maternal Grandfather    History  Substance Use Topics  . Smoking status: Never Smoker   . Smokeless tobacco: Not on file  . Alcohol Use: No     Review of Systems  Constitutional: Negative for fever and chills.  HENT: Positive for neck pain. Negative for congestion and sore throat.   Eyes: Positive for visual disturbance.  Respiratory: Negative for cough and shortness of breath.   Cardiovascular: Negative for chest pain and leg swelling.  Gastrointestinal: Positive for nausea, vomiting and abdominal pain. Negative for diarrhea and constipation.  Genitourinary: Negative for dysuria and frequency.  Skin: Negative for color change and rash.  Neurological: Positive for headaches. Negative for dizziness.  Psychiatric/Behavioral: Negative for confusion and agitation.  All other systems reviewed and are negative.    Allergies  Review of patient's allergies indicates no known allergies.  Home Medications   Current Outpatient Rx  Name  Route  Sig  Dispense  Refill  . amLODipine (NORVASC) 10 MG tablet   Oral   Take 1 tablet (10 mg total) by mouth daily.   30 tablet   3   . aspirin 325 MG tablet   Oral   Take 325 mg by mouth daily.         . carvedilol (COREG) 25 MG tablet   Oral   Take 1 tablet (25 mg total) by mouth 2 (two) times daily.   60 tablet   2   . carvedilol (COREG) 25 MG tablet   Oral   Take 1 tablet (25 mg total) by mouth 2 (two) times daily with a meal.   60 tablet   3   . cloNIDine (CATAPRES) 0.3 MG tablet   Oral   Take 1 tablet (0.3  mg total) by mouth 3 (three) times daily.   60 tablet   3   . ferrous sulfate 325 (65 FE) MG tablet   Oral   Take 325 mg by mouth 2 (two) times daily.         Marland Kitchen gabapentin (NEURONTIN) 100 MG capsule   Oral   Take 3 capsules (300 mg total) by mouth 3 (three) times daily.   90 capsule   3   . hydrALAZINE (APRESOLINE) 100 MG tablet   Oral   Take 1 tablet (100 mg total) by mouth 3 (three) times daily.   90 tablet   3   . isosorbide mononitrate (IMDUR) 60 MG 24 hr tablet   Oral   Take 2 tablets (120 mg total) by mouth daily.   30 tablet   3   .  lisinopril (PRINIVIL,ZESTRIL) 40 MG tablet   Oral   Take 1 tablet (40 mg total) by mouth 2 (two) times daily.   30 tablet   3   . ondansetron (ZOFRAN) 4 MG tablet   Oral   Take 1 tablet (4 mg total) by mouth every 6 (six) hours as needed for nausea.   20 tablet   0   . traMADol (ULTRAM) 50 MG tablet   Oral   Take 1 tablet (50 mg total) by mouth every 8 (eight) hours as needed.   12 tablet   0    BP 229/142  Pulse 102  Temp(Src) 97.7 F (36.5 C) (Oral)  Resp 24  Ht 5\' 11"  (1.803 m)  Wt 240 lb (108.863 kg)  BMI 33.49 kg/m2  SpO2 98% Physical Exam  Constitutional: He is oriented to person, place, and time. He appears well-developed and well-nourished. No distress.  HENT:  Head: Normocephalic and atraumatic.  Eyes: EOM are normal. Pupils are equal, round, and reactive to light.  Neck: Normal range of motion. Neck supple.  Cardiovascular: Normal rate and regular rhythm.   Pulmonary/Chest: Effort normal. No respiratory distress.  Abdominal: Soft. He exhibits no distension. There is no tenderness. There is no rigidity, no rebound, no guarding, no CVA tenderness, no tenderness at McBurney's point and negative Murphy's sign.  Musculoskeletal: Normal range of motion. He exhibits no edema.  Neurological: He is alert and oriented to person, place, and time. He has normal strength. No cranial nerve deficit or sensory deficit. Coordination normal. GCS eye subscore is 4. GCS verbal subscore is 5. GCS motor subscore is 6.  Skin: Skin is warm and dry.  Psychiatric: He has a normal mood and affect. His behavior is normal.    ED Course  Procedures (including critical care time) Labs Reviewed - No data to display No results found. No diagnosis found. Results for orders placed during the hospital encounter of 12/07/12  CBC WITH DIFFERENTIAL      Result Value Range   WBC 13.8 (*) 4.0 - 10.5 K/uL   RBC 4.31  4.22 - 5.81 MIL/uL   Hemoglobin 10.4 (*) 13.0 - 17.0 g/dL   HCT 30.5 (*) 39.0  - 52.0 %   MCV 70.8 (*) 78.0 - 100.0 fL   MCH 24.1 (*) 26.0 - 34.0 pg   MCHC 34.1  30.0 - 36.0 g/dL   RDW 18.0 (*) 11.5 - 15.5 %   Platelets 219  150 - 400 K/uL   Neutrophils Relative % 89 (*) 43 - 77 %   Neutro Abs 12.3 (*) 1.7 - 7.7 K/uL   Lymphocytes Relative 7 (*) 12 -  46 %   Lymphs Abs 1.0  0.7 - 4.0 K/uL   Monocytes Relative 4  3 - 12 %   Monocytes Absolute 0.5  0.1 - 1.0 K/uL   Eosinophils Relative 0  0 - 5 %   Eosinophils Absolute 0.0  0.0 - 0.7 K/uL   Basophils Relative 0  0 - 1 %   Basophils Absolute 0.0  0.0 - 0.1 K/uL  COMPREHENSIVE METABOLIC PANEL      Result Value Range   Sodium 139  135 - 145 mEq/L   Potassium 3.7  3.5 - 5.1 mEq/L   Chloride 104  96 - 112 mEq/L   CO2 20  19 - 32 mEq/L   Glucose, Bld 124 (*) 70 - 99 mg/dL   BUN 39 (*) 6 - 23 mg/dL   Creatinine, Ser 3.58 (*) 0.50 - 1.35 mg/dL   Calcium 9.5  8.4 - 10.5 mg/dL   Total Protein 7.4  6.0 - 8.3 g/dL   Albumin 3.5  3.5 - 5.2 g/dL   AST 10  0 - 37 U/L   ALT 9  0 - 53 U/L   Alkaline Phosphatase 120 (*) 39 - 117 U/L   Total Bilirubin 0.6  0.3 - 1.2 mg/dL   GFR calc non Af Amer 21 (*) >90 mL/min   GFR calc Af Amer 25 (*) >90 mL/min  LIPASE, BLOOD      Result Value Range   Lipase 35  11 - 59 U/L  URINALYSIS, ROUTINE W REFLEX MICROSCOPIC      Result Value Range   Color, Urine YELLOW  YELLOW   APPearance CLEAR  CLEAR   Specific Gravity, Urine 1.009  1.005 - 1.030   pH 7.0  5.0 - 8.0   Glucose, UA NEGATIVE  NEGATIVE mg/dL   Hgb urine dipstick NEGATIVE  NEGATIVE   Bilirubin Urine NEGATIVE  NEGATIVE   Ketones, ur NEGATIVE  NEGATIVE mg/dL   Protein, ur 100 (*) NEGATIVE mg/dL   Urobilinogen, UA 0.2  0.0 - 1.0 mg/dL   Nitrite NEGATIVE  NEGATIVE   Leukocytes, UA NEGATIVE  NEGATIVE  URINE MICROSCOPIC-ADD ON      Result Value Range   Squamous Epithelial / LPF RARE  RARE   WBC, UA 0-2  <3 WBC/hpf   Bacteria, UA RARE  RARE  CG4 I-STAT (LACTIC ACID)      Result Value Range   Lactic Acid, Venous 1.08  0.5 -  2.2 mmol/L  CG4 I-STAT (LACTIC ACID)      Result Value Range   Lactic Acid, Venous 1.06  0.5 - 2.2 mmol/L    MDM  Exam as above, HTN w/ prssure 229/142, no focal neuro deficit - doubt CVA/TIA/SAH. HA benign and c/w prior HTN elevation. Will refrain from CT head. Denies chest pain or dyspnea - doubt ACS. Likely gastritis, PUD or gastroparesis. Hasn't been taking antihypertensives now w/ HTN urgency. Not emergency at this time - no signs of end organ failure. abd soft and benign - doubt perforated viscus. Lactic acid 1.06, mild leukocytosis w/ left shift - afebrile - doubt sepsis. Cr 3.58 - at baseline. Given 500cc IVF for likely mild dehydration for emesis. No gapped met acidosis - doubt DKA. Lipase normal, u/a neg for infection. Given home dose of BP meds and hydral 10mg  and labetalol 20mg . Pressure responded. Pain controlled w/ morphine. Given zofran for emesis - no additional emesis in ED. D/w family practice and pt admitted for uncontrolled HTN. Admit in stable  condition.   I have personally reviewed labs and considered in my MDM. Case d/w Dr Stark Jock  1. Hypertension   2. Headache   3. Nausea and vomiting      Ernst Spell, MD 12/07/12 1531

## 2012-12-07 NOTE — Consult Note (Signed)
32 year old male with a history of severe hypertension and known CKD not evaluated after a three day hospital stay at [facility] in May on the teaching service.  At that time patient had severe hypertension to 252/182, renal artery dopplers were negative for renal artery stenosis, right kidney was 11.1cm and left kidney 10.6cm, "baseline creatinine was 3.4 - 4.0 ", and 24 hour urine metanephrine was elevated at 1248 mcg/24 hrs, UA showed >300mg /dl protein, serum albumin was 3.0 g/dl.  Out patient follow was not done.  We are asked to assist.  According to a note form Dr. Linna Darner dated 09/02/12, he relocated to [facility] in April from [place] where he was seeing a nephrologist and told his kidneys were working at 44%.  Pt presented on this occasion with nausea, vomiting, blurred vision and head ache. His BP was found to be 230/140.  His grandfather in [place] has ESRD and receives dialysis.  He has a history of diabetes mellitus.   Past Medical History  Diagnosis Date  . Arthritis   . Chronic kidney disease   . Hypertension   . Atrial fibrillation   . Type II diabetes mellitus   . History of bronchitis     "when I was little" (12/07/2012)  . Sleep apnea     does not wear mask (12/07/2012)  . [id number])     "weekly" (12/07/2012)  . Anemia of chronic disease    Past Surgical History  Procedure Laterality Date  . No past surgeries     Social History:  reports that he quit smoking about 13 years ago. His smoking use included Cigarettes. He smoked 0.00 packs per day for .1 years. He has never used smokeless tobacco. He reports that he does not drink alcohol or use illicit drugs. Allergies: No Known Allergies Family History  Problem Relation Age of Onset  . Diabetes Father   . Hypertension Father   . Hypertension Maternal Grandmother   . Hypertension Maternal Grandfather   . Kidney disease Maternal Grandfather   . Diabetes Maternal Grandfather     Medications:  Scheduled: . amLODipine  10  mg Oral Daily  . aspirin EC  81 mg Oral Daily  . carvedilol  25 mg Oral BID AC  . cloNIDine  0.3 mg Oral TID  . ferrous sulfate  325 mg Oral BID  . gabapentin  300 mg Oral TID  . heparin  5,000 Units Subcutaneous Q8H  . hydrALAZINE  100 mg Oral TID  . insulin aspart  0-9 Units Subcutaneous TID WC  . isosorbide mononitrate  120 mg Oral Daily  . lisinopril  40 mg Oral BID  . senna  1 tablet Oral BID  . sodium chloride  3 mL Intravenous Q12H   ROS: He reports financial issues as reason for poor medical followup and vomiting as reason for high bP because he has meds at [facility].   Blood pressure 174/86, pulse 81, temperature 97.8 F (36.6 C), temperature source Oral, resp. rate 20, height 5\' 11"  (1.803 m), weight 113.762 kg (250 lb 12.8 oz), SpO2 99.00%.  General appearance: alert and cooperative Head: Normocephalic, without obvious abnormality, atraumatic Eyes: negative Ears: normal TM's and external ear canals both ears Nose: no discharge Resp: clear to auscultation bilaterally Chest wall: no tenderness Cardio: regular rate and rhythm, S1, S2 normal, no murmur, click, rub or gallop GI: soft, non-tender; bowel sounds normal; no masses,  no organomegaly Extremities: extremities normal, atraumatic, no cyanosis or edema and some  mild chronic changes to skin Skin: mild dryness of LE Neurologic: Grossly normal Results for orders placed during the hospital encounter of 12/07/12 (from the past 48 hour(s))  URINALYSIS, ROUTINE W REFLEX MICROSCOPIC     Status: Abnormal   Collection Time    12/07/12 11:08 AM      Result Value Range   Color, Urine YELLOW  YELLOW   APPearance CLEAR  CLEAR   Specific Gravity, Urine 1.009  1.005 - 1.030   pH 7.0  5.0 - 8.0   Glucose, UA NEGATIVE  NEGATIVE mg/dL   Hgb urine dipstick NEGATIVE  NEGATIVE   Bilirubin Urine NEGATIVE  NEGATIVE   Ketones, ur NEGATIVE  NEGATIVE mg/dL   Protein, ur 100 (*) NEGATIVE mg/dL   Urobilinogen, UA 0.2  0.0 - 1.0 mg/dL    Nitrite NEGATIVE  NEGATIVE   Leukocytes, UA NEGATIVE  NEGATIVE  URINE MICROSCOPIC-ADD ON     Status: None   Collection Time    12/07/12 11:08 AM      Result Value Range   Squamous Epithelial / LPF RARE  RARE   WBC, UA 0-2  <3 WBC/hpf   Bacteria, UA RARE  RARE  CBC WITH DIFFERENTIAL     Status: Abnormal   Collection Time    12/07/12 11:50 AM      Result Value Range   WBC 13.8 (*) 4.0 - 10.5 K/uL   RBC 4.31  4.22 - 5.81 MIL/uL   Hemoglobin 10.4 (*) 13.0 - 17.0 g/dL   HCT 30.5 (*) 39.0 - 52.0 %   MCV 70.8 (*) 78.0 - 100.0 fL   MCH 24.1 (*) 26.0 - 34.0 pg   MCHC 34.1  30.0 - 36.0 g/dL   RDW 18.0 (*) 11.5 - 15.5 %   Platelets 219  150 - 400 K/uL   Neutrophils Relative % 89 (*) 43 - 77 %   Neutro Abs 12.3 (*) 1.7 - 7.7 K/uL   Lymphocytes Relative 7 (*) 12 - 46 %   Lymphs Abs 1.0  0.7 - 4.0 K/uL   Monocytes Relative 4  3 - 12 %   Monocytes Absolute 0.5  0.1 - 1.0 K/uL   Eosinophils Relative 0  0 - 5 %   Eosinophils Absolute 0.0  0.0 - 0.7 K/uL   Basophils Relative 0  0 - 1 %   Basophils Absolute 0.0  0.0 - 0.1 K/uL  COMPREHENSIVE METABOLIC PANEL     Status: Abnormal   Collection Time    12/07/12 11:50 AM      Result Value Range   Sodium 139  135 - 145 mEq/L   Potassium 3.7  3.5 - 5.1 mEq/L   Chloride 104  96 - 112 mEq/L   CO2 20  19 - 32 mEq/L   Glucose, Bld 124 (*) 70 - 99 mg/dL   BUN 39 (*) 6 - 23 mg/dL   Creatinine, Ser 3.58 (*) 0.50 - 1.35 mg/dL   Calcium 9.5  8.4 - 10.5 mg/dL   Total Protein 7.4  6.0 - 8.3 g/dL   Albumin 3.5  3.5 - 5.2 g/dL   AST 10  0 - 37 U/L   ALT 9  0 - 53 U/L   Alkaline Phosphatase 120 (*) 39 - 117 U/L   Total Bilirubin 0.6  0.3 - 1.2 mg/dL   GFR calc non Af Amer 21 (*) >90 mL/min   GFR calc Af Amer 25 (*) >90 mL/min   Comment:  The eGFR has been calculated     using the CKD EPI equation.     This calculation has not been     validated in all clinical     situations.     eGFR's persistently     <90 mL/min signify     possible  Chronic Kidney Disease.  LIPASE, BLOOD     Status: None   Collection Time    12/07/12 11:50 AM      Result Value Range   Lipase 35  11 - 59 U/L  CG4 I-STAT (LACTIC ACID)     Status: None   Collection Time    12/07/12 12:12 PM      Result Value Range   Lactic Acid, Venous 1.08  0.5 - 2.2 mmol/L  CG4 I-STAT (LACTIC ACID)     Status: None   Collection Time    12/07/12  2:34 PM      Result Value Range   Lactic Acid, Venous 1.06  0.5 - 2.2 mmol/L  GLUCOSE, CAPILLARY     Status: Abnormal   Collection Time    12/07/12  4:57 PM      Result Value Range   Glucose-Capillary 127 (*) 70 - 99 mg/dL  TROPONIN I     Status: None   Collection Time    12/07/12  5:30 PM      Result Value Range   Troponin I <0.30  <0.30 ng/mL   Comment:            Due to the release kinetics of cTnI,     a negative result within the first hours     of the onset of symptoms does not rule out     myocardial infarction with certainty.     If myocardial infarction is still suspected,     repeat the test at appropriate intervals.   No results found.  Assessment:  1 Stage 4 Chronic Kidney Disease of uncertain etiology (hypertension v diabetic nephropathy v idiopathic GN v nephrosclerosis) 2 Severe hypertension 3 Elevated metanephrine of uncertain significance, r/o pheo/paraganglioma Rec: 1 MRI of adrenal glands 2 Aggressive attention to outpt BP control 3 24 hour urine for protein and creatinine to help better define etiology of renal disease; ANA,SPEP  Thank you, we will follow with you.   Guiseppe Flanagan C 12/07/2012, 9:27 PM

## 2012-12-07 NOTE — ED Provider Notes (Signed)
I saw and evaluated the patient, reviewed the resident's note and I agree with the findings and plan. The patient presents with complaints of nausea, vomiting, headache.  He has been unable to keep his medications down due to this and his blood pressure has been running high as a result.  He denies any bloody emesis.  No fevers or chills.  No injury or trauma.  He has had similar episodes in the past.   On exam, the patient appears uncomfortable.  He is markedly hypertensive with blood pressure initially 229/142, vitals otherwise stable.  The heart is regular rate and rhythm without murmurs and the lungs are clear.  The abdomen is benign.  Neurologically, the cranial nerves are intact without deficit.  There is no papilledema upon fundoscopic exam.    The workup reveals an elevated wbc but no fever.  The ekg and electrolytes are unremarkable.  I have reviewed the resident's interpretation of the ekg and agree with this.  He was given fluids, zofran followed by the home medications he was unable to tolerate at home.  He also required iv hydralazine, and eventually the blood pressure improved.  He will be admitted to the internal medicine service for further management and normalization of his blood pressure.  I suspect there may be a component of undiagnosed gastroparesis as well.     Veryl Speak, MD 12/07/12 [phone]

## 2012-12-07 NOTE — ED Notes (Signed)
Called the floor to give report but nurse in busy in isolation.

## 2012-12-07 NOTE — H&P (Signed)
FMTS Attending Admission Note: Brenee Gajda MD [phone] pager office [phone] I  have seen and examined this patient, reviewed their chart. I have discussed this patient with the resident. I agree with the resident's findings, assessment and care plan. 

## 2012-12-08 ENCOUNTER — Inpatient Hospital Stay (HOSPITAL_COMMUNITY): Payer: Medicaid Other

## 2012-12-08 DIAGNOSIS — E119 Type 2 diabetes mellitus without complications: Secondary | ICD-10-CM

## 2012-12-08 DIAGNOSIS — R112 Nausea with vomiting, unspecified: Secondary | ICD-10-CM

## 2012-12-08 DIAGNOSIS — N184 Chronic kidney disease, stage 4 (severe): Secondary | ICD-10-CM

## 2012-12-08 DIAGNOSIS — I10 Essential (primary) hypertension: Secondary | ICD-10-CM

## 2012-12-08 LAB — CBC
MCH: 24.2 pg — ABNORMAL LOW (ref 26.0–34.0)
Platelets: 205 10*3/uL (ref 150–400)
RBC: 3.72 MIL/uL — ABNORMAL LOW (ref 4.22–5.81)
RDW: 18.1 % — ABNORMAL HIGH (ref 11.5–15.5)

## 2012-12-08 LAB — BASIC METABOLIC PANEL
Calcium: 8.9 mg/dL (ref 8.4–10.5)
Creatinine, Ser: 3.82 mg/dL — ABNORMAL HIGH (ref 0.50–1.35)
GFR calc non Af Amer: 20 mL/min — ABNORMAL LOW (ref >90)
Glucose, Bld: 100 mg/dL — ABNORMAL HIGH (ref 70–99)
Sodium: 142 mEq/L (ref 135–145)

## 2012-12-08 LAB — GLUCOSE, CAPILLARY: Glucose-Capillary: 119 mg/dL — ABNORMAL HIGH (ref 70–99)

## 2012-12-08 MED ORDER — DARBEPOETIN ALFA-POLYSORBATE 60 MCG/0.3ML IJ SOLN
60.0000 ug | INTRAMUSCULAR | Status: DC
Start: 2012-12-08 — End: 2012-12-10
  Administered 2012-12-08: 60 ug via SUBCUTANEOUS
  Filled 2012-12-08: qty 0.3

## 2012-12-08 MED FILL — Hydralazine HCl Tab 50 MG: ORAL | Qty: 2 | Status: AC

## 2012-12-08 NOTE — Progress Notes (Signed)
FMTS Attending Daily Note: Raymone Pembroke MD [phone] pager office [phone] I  have seen and examined this patient, reviewed their chart. I have discussed this patient with the resident. I agree with the resident's findings, assessment and care plan. 

## 2012-12-08 NOTE — Progress Notes (Signed)
Family Medicine Teaching Service Daily Progress Note Intern Pager: [phone]  Patient name: Joshua [name] Medical record number: [id number] Date of birth: May 08, 1981 Age: 32 y.o. Gender: male  Primary Care Provider: Provider Not In System Consultants: Nephrology Code Status: Full  Pt Overview and Major Events to Date:   7/16 - troponin negative  Assessment and Plan: Joshua [name] is a 32 y.o. year old male presenting with headache & nausea/vomiting x5 days. PMH is significant for HTN, DM2 and CKD.   1. Hypertensive Emergency - presented with BP of 230/140 with no signs of end organ damage (Creatinine: 3.58 close to baseline). Associated with headache and blurry vision. Currently 138/80 after treatment in the ED.  - Admit to Telemetry, attending Dr. Nori Riis. BP now stable, but if were to remain elevated would need transfer to SDU.  - Continue home meds   - amlodipine 10mg  qD   - carvedilol 25mg  BID   - clonidine 0.3mg  TID   - hydralazine 10mg  qD   - Isosorbide mononitrate 120mg  qD   - lisinopril 40mg  BID  - troponin: <0.3 [ ]  f/u AM EKG  - Vitals per floor protocol. Patient is being restarted on multiple medications. If his BP drops too much, we will need to cut back on his home meds. - 7/16 - BPs currently in 130-160/70-90s.    2. Nausea & Vomiting. Non-bloody, non-bilious. Associated with eating. No known infection risk factors  - start zofran 4mg  q4hrs  - on full liquid diet, Advance slowly as tolerated - has had no episodes of nausea/vomiting - advance diet to full solid [ ]  f/u bmet daily   3. Anemia of chronic disease, currently at 10.4 (baseline)  - continuing home ferrous sulfate 325mg  BID  [ ]  follow-up CBC   4. DM (type II) Questionable - Not on any medications as an outpatient. Last A1C 5.6 in May.  - started on sensitive sliding scale insulin  - A1C: 5.5  5. Difficult stools - Last BM this morning of 7/15, however it was difficult. Currently on morphine PRN  -  start senna 8.6mg  BID   6. Neuro/Psych  - continue home gabapentin 100mg  TID   7. CKD- Patient with known CKD, creatinine now around baseline at 3.5. Interestingly, at last hospitalization urine studies were performed to evaluate for pheo and/or other tumors which could be contributing to his hypertension. His metanephrines returned at double the normal value, which may or may not be significant.  - Will consult renal for further recommendations about imaging of kidney/adrenals  - Also for BP control and CKD.  - current creatinine is 3.82, up from 3.58 from 7/15. - renal recommends MRI of adrenal glands, aggressive outpatient BP control, 24 hour urine (for protein and creatinine), ANA, and serum protein electrophoresis   FEN/GI: NPO PPx: Heparin subq  Disposition: pending po tolerance of food and medication  Subjective: No problems overnight. No nausea or vomiting.   Objective: Temp:  [97.6 F (36.4 C)-98.4 F (36.9 C)] 98.4 F (36.9 C) (07/16 0524) Pulse Rate:  [74-107] 75 (07/16 0524) Resp:  [0-31] 22 (07/16 0524) BP: (138-229)/(78-142) 161/95 mmHg (07/16 0524) SpO2:  [95 %-100 %] 100 % (07/16 0524) Weight:  [240 lb (108.863 kg)-254 lb (115.214 kg)] 254 lb (115.214 kg) (07/16 0546) Physical Exam: General: Obese, no acute distress, awake Cardiovascular: RRR, no murmurs Respiratory: CTAB, no wheeze Abdomen: obese, soft, non-tender, non-distended Extremities: no edema  Laboratory:  Recent Labs Lab 12/07/12 1150  WBC 13.8*  HGB  10.4*  HCT 30.5*  PLT 219    Recent Labs Lab 12/07/12 1150  NA 139  K 3.7  CL 104  CO2 20  BUN 39*  CREATININE 3.58*  CALCIUM 9.5  PROT 7.4  BILITOT 0.6  ALKPHOS 120*  ALT 9  AST 10  GLUCOSE 124*    A1C: 5.5 Troponin <0.3  Joshua Poche, MD 12/08/2012, 6:47 AM PGY-1, [place] Intern pager: [phone], text pages welcome

## 2012-12-08 NOTE — Progress Notes (Signed)
Subjective:  Pt still complaining of headache- BP is adequate [id number] systolic on current reg.  Dr. Florene Glen had said that MRI was ordered, I do not see it there so have re- ordered Objective Vital signs in last 24 hours: Filed Vitals:   12/07/12 2101 12/07/12 2300 12/08/12 0524 12/08/12 0546  BP: 174/86 150/78 161/95   Pulse: 81 74 75   Temp: 97.8 F (36.6 C)  98.4 F (36.9 C)   TempSrc: Oral  Oral   Resp: 20  22   Height:      Weight:   114.76 kg (253 lb) 115.214 kg (254 lb)  SpO2: 99%  100%    Weight change:   Intake/Output Summary (Last 24 hours) at 12/08/12 1020 Last data filed at 12/08/12 0700  Gross per 24 hour  Intake 1429.17 ml  Output   1225 ml  Net 204.17 ml   Labs: Basic Metabolic Panel:  Recent Labs Lab 12/07/12 1150 12/08/12 0633  NA 139 142  K 3.7 4.0  CL 104 110  CO2 20 22  GLUCOSE 124* 100*  BUN 39* 39*  CREATININE 3.58* 3.82*  CALCIUM 9.5 8.9   Liver Function Tests:  Recent Labs Lab 12/07/12 1150  AST 10  ALT 9  ALKPHOS 120*  BILITOT 0.6  PROT 7.4  ALBUMIN 3.5    Recent Labs Lab 12/07/12 1150  LIPASE 35   No results found for this basename: AMMONIA,  in the last 168 hours CBC:  Recent Labs Lab 12/07/12 1150 12/08/12 0633  WBC 13.8* 12.3*  NEUTROABS 12.3*  --   HGB 10.4* 9.0*  HCT 30.5* 26.7*  MCV 70.8* 71.8*  PLT 219 205   Cardiac Enzymes:  Recent Labs Lab 12/07/12 1730  TROPONINI <0.30   CBG:  Recent Labs Lab 12/07/12 1657 12/07/12 2152 12/08/12 0830  GLUCAP 127* 101* 88    Iron Studies: No results found for this basename: IRON, TIBC, TRANSFERRIN, FERRITIN,  in the last 72 hours Studies/Results: No results found. Medications: Infusions: . sodium chloride 125 mL/hr at 12/08/12 0554    Scheduled Medications: . amLODipine  10 mg Oral Daily  . aspirin EC  81 mg Oral Daily  . carvedilol  25 mg Oral BID AC  . cloNIDine  0.3 mg Oral TID  . ferrous sulfate  325 mg Oral BID  . gabapentin  300 mg Oral TID   . heparin  5,000 Units Subcutaneous Q8H  . hydrALAZINE  100 mg Oral TID  . insulin aspart  0-9 Units Subcutaneous TID WC  . isosorbide mononitrate  120 mg Oral Daily  . lisinopril  40 mg Oral BID  . senna  1 tablet Oral BID  . sodium chloride  3 mL Intravenous Q12H    have reviewed scheduled and prn medications.  Physical Exam: General: alert, NAD Heart: RRR Lungs: mostly clear Abdomen: soft, non tender Extremities: no peripheral edema   Assessment/ Plan: Pt is [name] 32 y.o. yo male who was admitted on 12/07/2012 with  Hypertensive urgency in the setting of not being able to keep his BP meds down- he has advanced CKD and elevated metanephrines suggestive of possibly [name] secondary cause of HTN Assessment/Plan: 1. Malignant HTN- good control right now with SBP in the 150-170 range on norvasc 10/coreg25 BID/clonidine 0.3 TID/hydralazine 100 TID/zestril 40 BID- elevated metanephrines in the past so MRI has been ordered of adrenals to evaluate for abnormality.  Will stop IVF.  Still concerned about getting BP too low this hosp,  will watch closely.  May want to substitute diuretic for something but this can be more finely tuned as we move along 2. Advanced CKD- likely due to HTN but GN has not been ruled out.  Proteinuria being quantified- making urine and is stable but poor. Needs to establish with OP nephrology at discharge.  He is at very high risk for progressing to ESRD and I told him this 3. Anemia- likely due to CKD- will add aranesp and check iron stores 4. Secondary hyperparathyroidism- will check PTH and phos and treat as neeed 5. GI issues- per primary- advancing diet as able.    Joshua [name]   12/08/2012,10:20 AM  LOS: 1 day

## 2012-12-08 NOTE — Care Management Note (Signed)
    Page 1 of 1   12/10/2012     4:36:19 PM   CARE MANAGEMENT NOTE 12/10/2012  Patient:  Joshua [name], Joshua [name]   Account Number:  [id number]  Date Initiated:  12/08/2012  Documentation initiated by:  Tomi Bamberger  Subjective/Objective Assessment:   dx htn, n/v, ha  admit- lives with room mate. pta indep. will need a bus pass.     Action/Plan:   patient already used Match Program in May, so this is not available to hime.   Anticipated DC Date:  12/10/2012   Anticipated DC Plan:  HOME/SELF CARE  In-house referral  Clinical Social Worker      DC Forensic scientist  CM consult  [facility]      Choice offered to / List presented to:             Status of service:  Completed, signed off Medicare Important Message given?   (If response is "NO", the following Medicare IM given date fields will be blank) Date Medicare IM given:   Date Additional Medicare IM given:    Discharge Disposition:  HOME/SELF CARE  Per UR Regulation:  Reviewed for med. necessity/level of care/duration of stay  If discussed at [place] of Stay Meetings, dates discussed:    Comments:  12/10/12 16:34 Tomi Bamberger RN, BSN [phone] patient for dc today, patient 's meds are on the $4 list at [facility] except for norvasc which he can get from [facility] for $4 with [place] card.  Informed patient of this information.  Patient has transportation home today.  12/08/12 15:22 Tomi Bamberger RN, BSN [phone] pt lives with room mate, who does not drive.  Patient is indep.  Pt used the Match Program in May so he is not eligible to use it again.  NCM spoke with MD and informed him of this and that we need to use $4 [facility] list for all meds if possible, MD states that all of patient's meds is on the $4 list except for the Norvasc.  CSW consulted for bus pass.

## 2012-12-09 DIAGNOSIS — D631 Anemia in chronic kidney disease: Secondary | ICD-10-CM

## 2012-12-09 DIAGNOSIS — N189 Chronic kidney disease, unspecified: Secondary | ICD-10-CM

## 2012-12-09 LAB — CBC WITH DIFFERENTIAL/PLATELET
Basophils Relative: 0 % (ref 0–1)
Eosinophils Absolute: 0.1 10*3/uL (ref 0.0–0.7)
Hemoglobin: 9.2 g/dL — ABNORMAL LOW (ref 13.0–17.0)
Lymphs Abs: 1.5 10*3/uL (ref 0.7–4.0)
MCH: 25.1 pg — ABNORMAL LOW (ref 26.0–34.0)
Monocytes Relative: 8 % (ref 3–12)
Neutro Abs: 7.6 10*3/uL (ref 1.7–7.7)
Neutrophils Relative %: 77 % (ref 43–77)
Platelets: 215 10*3/uL (ref 150–400)
RBC: 3.67 MIL/uL — ABNORMAL LOW (ref 4.22–5.81)

## 2012-12-09 LAB — RENAL FUNCTION PANEL
Albumin: 2.6 g/dL — ABNORMAL LOW (ref 3.5–5.2)
BUN: 37 mg/dL — ABNORMAL HIGH (ref 6–23)
Chloride: 109 mEq/L (ref 96–112)
GFR calc Af Amer: 23 mL/min — ABNORMAL LOW (ref >90)
Glucose, Bld: 103 mg/dL — ABNORMAL HIGH (ref 70–99)
Phosphorus: 4.9 mg/dL — ABNORMAL HIGH (ref 2.3–4.6)
Potassium: 4.2 mEq/L (ref 3.5–5.1)
Sodium: 141 mEq/L (ref 135–145)

## 2012-12-09 LAB — GLUCOSE, CAPILLARY

## 2012-12-09 LAB — IRON AND TIBC: UIBC: 229 ug/dL (ref 125–400)

## 2012-12-09 LAB — FERRITIN: Ferritin: 84 ng/mL (ref 22–322)

## 2012-12-09 LAB — ANA: Anti Nuclear Antibody(ANA): NEGATIVE

## 2012-12-09 MED ORDER — SODIUM CHLORIDE 0.9 % IV SOLN
125.0000 mg | Freq: Every day | INTRAVENOUS | Status: AC
  Administered 2012-12-09 – 2012-12-10 (×2): 125 mg via INTRAVENOUS
  Filled 2012-12-09 (×3): qty 10

## 2012-12-09 MED ORDER — FUROSEMIDE 40 MG PO TABS
40.0000 mg | ORAL_TABLET | Freq: Two times a day (BID) | ORAL | Status: DC
  Administered 2012-12-09 – 2012-12-10 (×2): 40 mg via ORAL
  Filled 2012-12-09 (×4): qty 1

## 2012-12-09 MED ORDER — HYDRALAZINE HCL 50 MG PO TABS
100.0000 mg | ORAL_TABLET | Freq: Two times a day (BID) | ORAL | Status: DC
  Administered 2012-12-09 – 2012-12-10 (×2): 100 mg via ORAL
  Filled 2012-12-09 (×3): qty 2

## 2012-12-09 MED ORDER — LISINOPRIL 40 MG PO TABS
40.0000 mg | ORAL_TABLET | Freq: Every day | ORAL | Status: DC
  Administered 2012-12-10: 40 mg via ORAL
  Filled 2012-12-09: qty 1

## 2012-12-09 NOTE — Progress Notes (Signed)
Family Medicine Teaching Service Daily Progress Note Intern Pager: [phone]  Patient name: Joshua [name] Medical record number: [id number] Date of birth: 11-01-1980 Age: 32 y.o. Gender: male  Primary Care Provider: Provider Not In System Consultants: Nephrology Code Status: Full  Pt Overview and Major Events to Date:   7/16 - troponin negative  Assessment and Plan: Joshua [name] is a 32 y.o. year old male presenting with headache & nausea/vomiting x5 days. PMH is significant for HTN, DM2 and CKD.   1. Hypertensive Emergency - presented with BP of 230/140 with no signs of end organ damage (Creatinine: 3.58 close to baseline). Associated with headache and blurry vision. Currently 138/80 after treatment in the ED.  - Admit to Telemetry, attending Dr. Nori Riis. BP now stable, but if were to remain elevated would need transfer to SDU.  - Continue home meds   - amlodipine 10mg  qD   - carvedilol 25mg  BID   - clonidine 0.3mg  TID   - hydralazine 10mg  qD   - Isosorbide mononitrate 120mg  qD   - lisinopril 40mg  BID  - 7/17 - BPs currently in 110-170s/60-100s.    2. Nausea & Vomiting. Non-bloody, non-bilious. Associated with eating. No known infection risk factors. Has had no episodes of nausea/vomiting and is tolerating a full liquid diet - continue zofran 4mg  q4hrs - advanced to heart health diet  [ ]  f/u bmet daily   3. Anemia of chronic disease, currently at 9.2 (baseline)  - continuing home ferrous sulfate 325mg  BID - started on darbepoetin 62mcg qWednesday [ ]  follow-up CBC  4. DM (type II)? - Not on any medications as an outpatient. Last A1C 5.6 in May.  - started on sensitive sliding scale insulin  - A1C (7/15): 5.5  5. Difficult stools - Last BM this morning of 7/15, however it was difficult. Currently on morphine PRN  - Continue senna 8.6mg  BID   6. Neuro/Psych  - continue home gabapentin 100mg  TID   7. CKD- Patient with known CKD, creatinine now around baseline at 3.5.  Interestingly, at last hospitalization urine studies were performed to evaluate for pheo and/or other tumors which could be contributing to his hypertension. His metanephrines returned at double the normal value, which may or may not be significant.  - Also for BP control and CKD.  - (7/17) current creatinine is 3.752, down from 3.82 on 7/16. [ ]  target BP [id number] systolic [ ]  f/u ANA [ ]  f/u serum protein electrophoresis [ ]  f/u 24 hour creatinine [ ]  f/u 24 hour protein [ ]  f/u ferritin/Fe/TIBC [ ]  needs outpatient follow up with nephrologist  8. Hyperphosphatemia - calcium levels are normal, patient has CKD. Most likely secondary parathyroidism. [ ]  f/u parathyroid hormone   FEN/GI: heart healthy diet PPx: Heparin subq  Disposition: pending po tolerance of food and medication  Subjective: No nausea/vomiting. Has been tolerating medications. No problems or complaints overnight.   Objective: Temp:  [97.6 F (36.4 C)-98.3 F (36.8 C)] 98.3 F (36.8 C) (07/17 0612) Pulse Rate:  [72-74] 72 (07/17 0612) Resp:  [18-22] 20 (07/17 0612) BP: (118-175)/(64-111) 158/95 mmHg (07/17 0612) SpO2:  [98 %-100 %] 100 % (07/17 0612) Weight:  [251 lb 8 oz (114.08 kg)-252 lb 1.6 oz (114.352 kg)] 251 lb 8 oz (114.08 kg) (07/17 0612)  Physical Exam: General: Obese, no acute distress, awake Cardiovascular: RRR, no murmurs Respiratory: CTAB, no wheeze Abdomen: obese, soft, non-tender, non-distended Extremities: no edema  Laboratory:  Recent Labs Lab 12/07/12 1150 12/08/12 0633 12/09/12  0445  WBC 13.8* 12.3* 9.9  HGB 10.4* 9.0* 9.2*  HCT 30.5* 26.7* 26.7*  PLT 219 205 215    Recent Labs Lab 12/07/12 1150 12/08/12 0633 12/09/12 0445  NA 139 142 141  K 3.7 4.0 4.2  CL 104 110 109  CO2 20 22 22   BUN 39* 39* 37*  CREATININE 3.58* 3.82* 3.75*  CALCIUM 9.5 8.9 8.7  PROT 7.4  --   --   BILITOT 0.6  --   --   ALKPHOS 120*  --   --   ALT 9  --   --   AST 10  --   --   GLUCOSE  124* 100* 103*    Imaging/Diagnostic Tests:  7/16 - MRI Abdomen (pending official reading)   Cordelia Poche, MD 12/09/2012, 7:01 AM PGY-1, [place] Intern pager: [phone], text pages welcome

## 2012-12-09 NOTE — Progress Notes (Signed)
Subjective:  Pt feels OK- BP is adequate - got as low as 118/64 last night but back up to 158/95 this AM.  Urine collection started only very early this AM. Abdominal MRI did not show any adrenal masses.     Objective Vital signs in last 24 hours: Filed Vitals:   12/08/12 1528 12/08/12 2052 12/09/12 0414 12/09/12 0612  BP: 146/81 118/64  158/95  Pulse: 73 74  72  Temp: 97.6 F (36.4 C) 97.7 F (36.5 C)  98.3 F (36.8 C)  TempSrc: Oral Oral  Oral  Resp: 22 18  20   Height:      Weight:   114.352 kg (252 lb 1.6 oz) 114.08 kg (251 lb 8 oz)  SpO2: 100% 98%  100%   Weight change: 5.489 kg (12 lb 1.6 oz)  Intake/Output Summary (Last 24 hours) at 12/09/12 1023 Last data filed at 12/09/12 0954  Gross per 24 hour  Intake    240 ml  Output   1000 ml  Net   -760 ml   Labs: Basic Metabolic Panel:  Recent Labs Lab 12/07/12 1150 12/08/12 0633 12/09/12 0445  NA 139 142 141  K 3.7 4.0 4.2  CL 104 110 109  CO2 20 22 22   GLUCOSE 124* 100* 103*  BUN 39* 39* 37*  CREATININE 3.58* 3.82* 3.75*  CALCIUM 9.5 8.9 8.7  PHOS  --   --  4.9*   Liver Function Tests:  Recent Labs Lab 12/07/12 1150 12/09/12 0445  AST 10  --   ALT 9  --   ALKPHOS 120*  --   BILITOT 0.6  --   PROT 7.4  --   ALBUMIN 3.5 2.6*    Recent Labs Lab 12/07/12 1150  LIPASE 35   No results found for this basename: AMMONIA,  in the last 168 hours CBC:  Recent Labs Lab 12/07/12 1150 12/08/12 0633 12/09/12 0445  WBC 13.8* 12.3* 9.9  NEUTROABS 12.3*  --  7.6  HGB 10.4* 9.0* 9.2*  HCT 30.5* 26.7* 26.7*  MCV 70.8* 71.8* 72.8*  PLT 219 205 215   Cardiac Enzymes:  Recent Labs Lab 12/07/12 1730  TROPONINI <0.30   CBG:  Recent Labs Lab 12/07/12 2152 12/08/12 0830 12/08/12 1225 12/08/12 1702 12/08/12 2231  GLUCAP 101* 88 97 119* 128*    Iron Studies:   Recent Labs  12/09/12 0445  IRON 22*  TIBC 251   Studies/Results: Mr Abdomen Wo Contrast  12/09/2012   *RADIOLOGY REPORT*  Clinical  Data: Hypertension with elevated serum metanephrines levels.  Evaluate for adrenal mass.  MRI ABDOMEN WITHOUT CONTRAST  Technique:  Multiplanar multisequence MR imaging of the abdomen was performed. No intravenous contrast was administered due to renal insufficiency.  Comparison: None.  Findings: Study is motion degraded.  The patient reportedly fell asleep during the examination.  There is no evidence of adrenal mass.  Both kidneys appear normal aside from a probable small parapelvic cyst in the lower pole of the right kidney.  There is no hydronephrosis or perinephric abnormality.  The liver, gallbladder, biliary system, pancreas and spleen demonstrate no abnormalities.  There is mild soft tissue edema and a small amount of ascites.  No focal extraluminal fluid collections are identified.  There are no pathologically enlarged lymph nodes.  IMPRESSION:  1.  No evidence of adrenal mass. 2.  No acute findings demonstrated. 3.  Small amount of ascites, possibly related to renal insufficiency - correlate clinically.   Original Report Authenticated By:  Richardean Sale, M.D.   Medications: Infusions:    Scheduled Medications: . amLODipine  10 mg Oral Daily  . aspirin EC  81 mg Oral Daily  . carvedilol  25 mg Oral BID AC  . cloNIDine  0.3 mg Oral TID  . darbepoetin (ARANESP) injection - NON-DIALYSIS  60 mcg Subcutaneous Q Wed-1800  . ferrous sulfate  325 mg Oral BID  . gabapentin  300 mg Oral TID  . heparin  5,000 Units Subcutaneous Q8H  . hydrALAZINE  100 mg Oral TID  . isosorbide mononitrate  120 mg Oral Daily  . lisinopril  40 mg Oral BID  . senna  1 tablet Oral BID  . sodium chloride  3 mL Intravenous Q12H    have reviewed scheduled and prn medications.  Physical Exam: General: alert, NAD Heart: RRR Lungs: mostly clear Abdomen: soft, non tender Extremities: no peripheral edema   Assessment/ Plan: Pt is a 32 y.o. yo male who was admitted on 12/07/2012 with  Hypertensive urgency in the  setting of not being able to keep his BP meds down- he has advanced CKD and elevated metanephrines suggestive of possibly a secondary cause of HTN Assessment/Plan: 1. Malignant HTN- good control right now with SBP in the 150-170 range with some occasional lower  on norvasc 10/coreg25 BID/clonidine 0.3 TID/hydralazine 100 TID/zestril 40 BID- elevated metanephrines in the past but MRI negative for mass.   Still concerned about getting BP too low this hosp, will watch closely.  I think that I do want to add a diuretic to his reg given a creatinine of 3 will likely help- since I am adding on have decreased lisinopril to 1 time a day and decreased hydralazine to BID as I hope the addition of a diuretic will allow [place] to wean some of these other meds as OP 2. Advanced CKD- likely due to HTN but GN has not been ruled out.  Proteinuria being quantified- making urine and function is stable but poor. Needs to establish with OP nephrology at discharge.  He is at very high risk for progressing to ESRD and I told him this 3. Anemia- likely due to CKD- have given aranesp and will replete iron with IV iron 4. Secondary hyperparathyroidism- will check PTH and phos and treat as needed.   Phos is OK- PTh is pending 5. GI issues- per primary- advancing diet as able.  6. Dispo- Since urine collection will not be done until tomorrow AM would like to have him stay inpt til then.  Possible discharge tomorrow if no other problems arise  I have arranged a follow up appt with me on August 13 at 9:30 AM   Esequiel Kleinfelter A   12/09/2012,10:23 AM  LOS: 2 days

## 2012-12-09 NOTE — Progress Notes (Signed)
Started 24 hour protein urine collection at 0345.

## 2012-12-09 NOTE — Progress Notes (Signed)
FMTS Attending Daily Note: Liyah Higham MD [phone] pager office [phone] I  have seen and examined this patient, reviewed their chart. I have discussed this patient with the resident. I agree with the resident's findings, assessment and care plan. 

## 2012-12-10 LAB — PROTEIN, URINE, 24 HOUR
Collection Interval-UPROT: 24 hours
Protein, 24H Urine: 2150 mg/d — ABNORMAL HIGH (ref 50–100)
Protein, Urine: 86 mg/dL

## 2012-12-10 LAB — PARATHYROID HORMONE, INTACT (NO CA): PTH: 170.3 pg/mL — ABNORMAL HIGH (ref 14.0–72.0)

## 2012-12-10 LAB — CBC WITH DIFFERENTIAL/PLATELET
Basophils Absolute: 0 10*3/uL (ref 0.0–0.1)
Basophils Relative: 0 % (ref 0–1)
Eosinophils Absolute: 0.1 10*3/uL (ref 0.0–0.7)
Eosinophils Relative: 1 % (ref 0–5)
HCT: 27.8 % — ABNORMAL LOW (ref 39.0–52.0)
MCH: 24.7 pg — ABNORMAL LOW (ref 26.0–34.0)
MCHC: 34.2 g/dL (ref 30.0–36.0)
MCV: 72.2 fL — ABNORMAL LOW (ref 78.0–100.0)
Monocytes Absolute: 0.6 10*3/uL (ref 0.1–1.0)
RDW: 17.5 % — ABNORMAL HIGH (ref 11.5–15.5)

## 2012-12-10 LAB — RENAL FUNCTION PANEL
Albumin: 2.9 g/dL — ABNORMAL LOW (ref 3.5–5.2)
CO2: 22 mEq/L (ref 19–32)
Calcium: 9.2 mg/dL (ref 8.4–10.5)
Creatinine, Ser: 3.87 mg/dL — ABNORMAL HIGH (ref 0.50–1.35)
GFR calc non Af Amer: 19 mL/min — ABNORMAL LOW (ref >90)
Phosphorus: 4.7 mg/dL — ABNORMAL HIGH (ref 2.3–4.6)

## 2012-12-10 LAB — PROTEIN ELECTROPHORESIS, SERUM
Albumin ELP: 51.8 % — ABNORMAL LOW (ref 55.8–66.1)
Alpha-1-Globulin: 5.1 % — ABNORMAL HIGH (ref 2.9–4.9)
Beta 2: 5 % (ref 3.2–6.5)
Total Protein ELP: 6 g/dL (ref 6.0–8.3)

## 2012-12-10 LAB — CREATININE, URINE, 24 HOUR
Collection Interval-UCRE24: 24 hours
Creatinine, Urine: 87.64 mg/dL
Urine Total Volume-UCRE24: 2500 mL

## 2012-12-10 NOTE — Progress Notes (Signed)
FMTS Attending Daily Note: Herminia Warren MD [phone] pager office [phone] I  have seen and examined this patient, reviewed their chart. I have discussed this patient with the resident. I agree with the resident's findings, assessment and care plan. 

## 2012-12-10 NOTE — Discharge Summary (Signed)
Family Medicine Teaching Service  Discharge Note : Attending Dylin Breeden MD Pager [phone] Office [phone] I have seen and examined this patient, reviewed their chart and discussed discharge planning wit the resident at the time of discharge. I agree with the discharge plan as above.  

## 2012-12-10 NOTE — Discharge Summary (Signed)
[facility] Discharge Summary  Patient name: Joshua [name] Medical record number: [id number] Date of birth: [birth year] Age: 32 y.o. Gender: male Date of Admission: 12/07/2012  Date of Discharge: 12/10/12 Admitting Physician: Dickie La, MD  Primary Care Provider: Provider Not In System Consultants: Nephrology  Indication for Hospitalization: 5 days of nausea and vomiting causing poor absorption of medications resulting in hypertensive emergency.   Discharge Diagnoses/Problem List:  Malignant hypertension, essential vs. Secondary Advanced CKD Anemia Hyperparathyroidism, secondary Obesity  Disposition: Home  Discharge Condition: Stable, normotensive  Brief Hospital Course:  Joshua [name] is a 32 y.o. year old male presenting with headache & nausea/vomiting x5 days. PMH is significant for HTN, DM2 and CKD.   Hypertensive Emergency. Presented with BP of 230/140, which was controlled and is now 134/86 on the morning of discharge.   Malignant Hypertension. Likely in part or in whole responsible for CKD. Previous admission found urine metanephrines 2x normal suspicious for secondary hypertension.  An MRI of the abdomen was performed which yielded no evidence of adrenal masses.    Medications were modified to the following regimen with adequate control: norvasc 10 coreg25 BID clonidine 0.3 TID hydralazine 100 BID zestril 40 daily  lasix 40 BID  CKD. Likely hypertensive nephropathy +/- glomerulonephritis. Patient with previous baseline creatinine near 3.5.  Creatinine is 3.87 on day of discharge, 7/18. Sub-nephrotic range proteinuria was found on this admission.   Dr. Moshe Cipro, nephrology, was consulted during this admission and will be following as an out-patient.  [ ]  f/u scheduled with Dr. Moshe Cipro 01/05/13 9:30AM   Anemia of chronic disease. Due to CKD. CBCs have shown a stable microcytic anemia. Current hgb = 9.5 which we suspect is baseline. He  is discharged on ferrous sulfate 325mg  BID and will follow-up for management with nephrology.   Hyperphosphatemia. Calcium levels are normal. Most likely secondary hyperparathyroidism.  [ ]  Parathyroid hormone is pending at discharge.   Nausea & Vomiting. Suspect gastroenteritis. Non-bloody, non-bilious. Associated with eating. No known infection risk factors.  Pt was discharged after tolerating a heart healthy diet.    Issues for Follow Up:  1) Anti-hypertensives management.  2) Monitor CKD progression, and secondary anemia, and likely secondary hyperparathyroidism.    Significant Procedures: None  Significant Labs and Imaging:   Recent Labs Lab 12/08/12 0633 12/09/12 0445 12/10/12 0610  WBC 12.3* 9.9 10.0  HGB 9.0* 9.2* 9.5*  HCT 26.7* 26.7* 27.8*  PLT 205 215 222    Recent Labs Lab 12/07/12 1150 12/08/12 0633 12/09/12 0445 12/10/12 0610  NA 139 142 141 139  K 3.7 4.0 4.2 4.2  CL 104 110 109 107  CO2 20 22 22 22   GLUCOSE 124* 100* 103* 117*  BUN 39* 39* 37* 39*  CREATININE 3.58* 3.82* 3.75* 3.87*  CALCIUM 9.5 8.9 8.7 9.2  PHOS  --   --  4.9* 4.7*  ALKPHOS 120*  --   --   --   AST 10  --   --   --   ALT 9  --   --   --   ALBUMIN 3.5  --  2.6* 2.9*   7/16 - MRI Abdomen  IMPRESSION:  1. No evidence of adrenal mass.  2. No acute findings demonstrated.  3. Small amount of ascites, possibly related to renal  insufficiency - correlate clinically.   Outstanding Results: Serum PTH  Discharge Medications:    Medication List    ASK your doctor about these medications  amLODipine 10 MG tablet  Commonly known as:  NORVASC  Take 1 tablet (10 mg total) by mouth daily.     aspirin 325 MG tablet  Take 325 mg by mouth daily.     carvedilol 25 MG tablet  Commonly known as:  COREG  Take 1 tablet (25 mg total) by mouth 2 (two) times daily.     carvedilol 25 MG tablet  Commonly known as:  COREG  Take 1 tablet (25 mg total) by mouth 2 (two) times daily with  a meal.     cloNIDine 0.3 MG tablet  Commonly known as:  CATAPRES  Take 1 tablet (0.3 mg total) by mouth 3 (three) times daily.     ferrous sulfate 325 (65 FE) MG tablet  Take 325 mg by mouth 2 (two) times daily.     gabapentin 100 MG capsule  Commonly known as:  NEURONTIN  Take 3 capsules (300 mg total) by mouth 3 (three) times daily.     hydrALAZINE 100 MG tablet  Commonly known as:  APRESOLINE  Take 1 tablet (100 mg total) by mouth 3 (three) times daily.     isosorbide mononitrate 60 MG 24 hr tablet  Commonly known as:  IMDUR  Take 2 tablets (120 mg total) by mouth daily.     lisinopril 40 MG tablet  Commonly known as:  PRINIVIL,ZESTRIL  Take 1 tablet (40 mg total) by mouth 2 (two) times daily.     ondansetron 4 MG tablet  Commonly known as:  ZOFRAN  Take 1 tablet (4 mg total) by mouth every 6 (six) hours as needed for nausea.     traMADol 50 MG tablet  Commonly known as:  ULTRAM  Take 1 tablet (50 mg total) by mouth every 8 (eight) hours as needed.        Discharge Instructions: Please refer to Patient Instructions section of EMR for full details.  Patient was counseled important signs and symptoms that should prompt return to medical care, changes in medications, dietary instructions, activity restrictions, and follow up appointments.   Follow-Up Appointments: Follow-up Information   Follow up with [facility] On 12/20/2012. (1:15 bring photo id meds and paperwork with $20 co pay, call Diane at [phone] to get apt for orange card)    Contact information:   [place] [phone] [phone]      Follow up with Louis Meckel, MD On 01/05/2013. (Appt is at 9:30, be there at 9)    Contact information:   [place] [address] [phone]       Vance Gather, MD 12/10/2012, 12:27 PM PGY-1, [place]

## 2012-12-10 NOTE — Progress Notes (Signed)
NURSING PROGRESS NOTE  Joshua [name] [id number] Discharge Data: 12/10/2012 2:59 PM Attending Provider: Dickie La, MD [id number] Not In System     [name] to be D/C'd Home per MD order.    All IV's discontinued with no bleeding noted.  All belongings returned to patient for patient to take home.   Last Vital Signs:  Blood pressure 164/94, pulse 71, temperature 97.9 F (36.6 C), temperature source Oral, resp. rate 18, height 5\' 11"  (1.803 m), weight 113.717 kg (250 lb 11.2 oz), SpO2 100.00%.  Discharge Medication List   Medication List         amLODipine 10 MG tablet  Commonly known as:  NORVASC  Take 1 tablet (10 mg total) by mouth daily.     aspirin 325 MG tablet  Take 325 mg by mouth daily.     carvedilol 25 MG tablet  Commonly known as:  COREG  Take 1 tablet (25 mg total) by mouth 2 (two) times daily.     carvedilol 25 MG tablet  Commonly known as:  COREG  Take 1 tablet (25 mg total) by mouth 2 (two) times daily with a meal.     cloNIDine 0.3 MG tablet  Commonly known as:  CATAPRES  Take 1 tablet (0.3 mg total) by mouth 3 (three) times daily.     ferrous sulfate 325 (65 FE) MG tablet  Take 325 mg by mouth 2 (two) times daily.     gabapentin 100 MG capsule  Commonly known as:  NEURONTIN  Take 3 capsules (300 mg total) by mouth 3 (three) times daily.     hydrALAZINE 100 MG tablet  Commonly known as:  APRESOLINE  Take 1 tablet (100 mg total) by mouth 3 (three) times daily.     isosorbide mononitrate 60 MG 24 hr tablet  Commonly known as:  IMDUR  Take 2 tablets (120 mg total) by mouth daily.     lisinopril 40 MG tablet  Commonly known as:  PRINIVIL,ZESTRIL  Take 1 tablet (40 mg total) by mouth 2 (two) times daily.     ondansetron 4 MG tablet  Commonly known as:  ZOFRAN  Take 1 tablet (4 mg total) by mouth every 6 (six) hours as needed for nausea.     traMADol 50 MG tablet  Commonly known as:  ULTRAM  Take 1 tablet (50 mg total) by mouth every 8  (eight) hours as needed.        Joslyn Hy, MSN, RN, [employer]

## 2012-12-10 NOTE — Progress Notes (Signed)
Subjective:  Pt feels OK- BP is adequate - got as low as 134/86 last night but back up to 158/95 this AM.  Urine collection completed. Abdominal MRI did not show any adrenal masses.     Objective Vital signs in last 24 hours: Filed Vitals:   12/09/12 2103 12/10/12 0500 12/10/12 0643 12/10/12 0959  BP: 134/86  158/96 164/94  Pulse: 69  71   Temp: 97.6 F (36.4 C)  97.9 F (36.6 C)   TempSrc: Oral  Oral   Resp: 18  18   Height:      Weight:  113.717 kg (250 lb 11.2 oz)    SpO2: 100%  100%    Weight change: -0.635 kg (-1 lb 6.4 oz)  Intake/Output Summary (Last 24 hours) at 12/10/12 1007 Last data filed at 12/09/12 1300  Gross per 24 hour  Intake    240 ml  Output    400 ml  Net   -160 ml   Labs: Basic Metabolic Panel:  Recent Labs Lab 12/08/12 0633 12/09/12 0445 12/10/12 0610  NA 142 141 139  K 4.0 4.2 4.2  CL 110 109 107  CO2 22 22 22   GLUCOSE 100* 103* 117*  BUN 39* 37* 39*  CREATININE 3.82* 3.75* 3.87*  CALCIUM 8.9 8.7 9.2  PHOS  --  4.9* 4.7*   Liver Function Tests:  Recent Labs Lab 12/07/12 1150 12/09/12 0445 12/10/12 0610  AST 10  --   --   ALT 9  --   --   ALKPHOS 120*  --   --   BILITOT 0.6  --   --   PROT 7.4  --   --   ALBUMIN 3.5 2.6* 2.9*    Recent Labs Lab 12/07/12 1150  LIPASE 35   No results found for this basename: AMMONIA,  in the last 168 hours CBC:  Recent Labs Lab 12/07/12 1150 12/08/12 0633 12/09/12 0445 12/10/12 0610  WBC 13.8* 12.3* 9.9 10.0  NEUTROABS 12.3*  --  7.6 7.8*  HGB 10.4* 9.0* 9.2* 9.5*  HCT 30.5* 26.7* 26.7* 27.8*  MCV 70.8* 71.8* 72.8* 72.2*  PLT 219 205 215 222   Cardiac Enzymes:  Recent Labs Lab 12/07/12 1730  TROPONINI <0.30   CBG:  Recent Labs Lab 12/08/12 0830 12/08/12 1225 12/08/12 1702 12/08/12 2231 12/09/12 2107  GLUCAP 88 97 119* 128* 154*    Iron Studies:   Recent Labs  12/09/12 0445  IRON 22*  TIBC 251  FERRITIN 84   Studies/Results: Mr Abdomen Wo  Contrast  12/09/2012   *RADIOLOGY REPORT*  Clinical Data: Hypertension with elevated serum metanephrines levels.  Evaluate for adrenal mass.  MRI ABDOMEN WITHOUT CONTRAST  Technique:  Multiplanar multisequence MR imaging of the abdomen was performed. No intravenous contrast was administered due to renal insufficiency.  Comparison: None.  Findings: Study is motion degraded.  The patient reportedly fell asleep during the examination.  There is no evidence of adrenal mass.  Both kidneys appear normal aside from a probable small parapelvic cyst in the lower pole of the right kidney.  There is no hydronephrosis or perinephric abnormality.  The liver, gallbladder, biliary system, pancreas and spleen demonstrate no abnormalities.  There is mild soft tissue edema and a small amount of ascites.  No focal extraluminal fluid collections are identified.  There are no pathologically enlarged lymph nodes.  IMPRESSION:  1.  No evidence of adrenal mass. 2.  No acute findings demonstrated. 3.  Small amount of  ascites, possibly related to renal insufficiency - correlate clinically.   Original Report Authenticated By: Richardean Sale, M.D.   Medications: Infusions:    Scheduled Medications: . amLODipine  10 mg Oral Daily  . aspirin EC  81 mg Oral Daily  . carvedilol  25 mg Oral BID AC  . cloNIDine  0.3 mg Oral TID  . darbepoetin (ARANESP) injection - NON-DIALYSIS  60 mcg Subcutaneous Q Wed-1800  . ferric gluconate (FERRLECIT/NULECIT) IV  125 mg Intravenous Daily  . ferrous sulfate  325 mg Oral BID  . furosemide  40 mg Oral BID  . gabapentin  300 mg Oral TID  . heparin  5,000 Units Subcutaneous Q8H  . hydrALAZINE  100 mg Oral BID  . isosorbide mononitrate  120 mg Oral Daily  . lisinopril  40 mg Oral Daily  . senna  1 tablet Oral BID  . sodium chloride  3 mL Intravenous Q12H    have reviewed scheduled and prn medications.  Physical Exam: General: alert, NAD Heart: RRR Lungs: mostly clear Abdomen: soft, non  tender Extremities: no peripheral edema   Assessment/ Plan: Pt is a 32 y.o. yo male who was admitted on 12/07/2012 with  Hypertensive urgency in the setting of not being able to keep his BP meds down- he has advanced CKD and elevated metanephrines suggestive of possibly a secondary cause of HTN Assessment/Plan: 1. Malignant HTN- good control right now with SBP in the 150-160 range with some occasional lower  on norvasc 10/coreg25 BID/clonidine 0.3 TID/hydralazine 100 BID/zestril 40 daily and lasix 40 BID- elevated metanephrines in the past but MRI negative for mass.  I am comfortable with control of BP on this reg.    2. Advanced CKD- likely due to HTN but GN has not been ruled out.  Proteinuria being quantified- around 2100 could still be consistent with just malignant HTN and not necessarily a GN.   Making urine and function is stable but poor. Needs to establish with OP nephrology at discharge.  He is at very high risk for progressing to ESRD and I told him this 3. Anemia- likely due to CKD- have given him a dose of aranesp and IV iron- I will determine as OP if needs more 4. Secondary hyperparathyroidism-   Phos is OK- PTh is pending 5. GI issues- per primary- advancing diet as able.  6. Dispo- I am OK with discharge to home today if OK with primary team.   I have arranged a follow up appt with me on August 13 at 9:30 AM- renal will sign off, call with questions   Gaetan Spieker A   12/10/2012,10:07 AM  LOS: 3 days

## 2012-12-10 NOTE — Progress Notes (Signed)
Family Medicine Teaching Service Daily Progress Note Intern Pager: [phone]  Patient name: Joshua [name] Medical record number: [id number] Date of birth: February 17, 1981 Age: 32 y.o. Gender: male  Primary Care Provider: Provider Not In System Consultants: Nephrology Code Status: Full  Pt Overview and Major Events to Date:   7/15: admitted for hypertensive urgency  Assessment and Plan: Joshua [name] is a 32 y.o. year old male presenting with headache & nausea/vomiting x5 days. PMH is significant for HTN, DM2 and CKD.   1. Hypertensive Emergency - presented with BP of 230/140 with no signs of end organ damage (Cr: 3.58 is baseline) - On telemetry - Currently on home meds, dose adjusted by renal, with additional lasix: amlodipine 10mg  qD  carvedilol 25mg  daily  clonidine 0.3mg  TID  hydralazine 10mg  qD  Isosorbide mononitrate 120mg  qD  lisinopril 40mg  daily  - 130s-150s/80s-90s in last 24 hrs.  - Cr: 3.87, stable  2. Nausea & Vomiting. Non-bloody, non-bilious. Associated with eating. No known infection risk factors. - Resolved, tolerating heart-healthy diet.   3. Anemia of chronic disease, currently at 9.2 (baseline)  - continuing home ferrous sulfate 325mg  BID - started on darbepoetin 59mcg qWednesday - CBC shows stable microcytic anemia.   4. DM (type II)? - Not on any medications as an outpatient. Last A1C 5.6 in May.  - started on sensitive sliding scale insulin  - A1C (7/15): 5.5  5. Difficult stools - Currently on morphine PRN  - Continue senna 8.6mg  BID   6. Neuro/Psych  - continue home gabapentin 100mg  TID   7. CKD- Patient with known CKD, creatinine now around baseline at 3.5.  Previous admission found urine metanephrines 2x normal, equivocal evaluation for pheochromocytoma.  - Also for BP control and CKD.  - (7/18) current creatinine is 3.87, apparent baseline 24hr Urine protein/creatinine: 2150/2191 = 0.98 = Sub-nephrotic range proteinuria [ ]  f/u scheduled with Dr.  Moshe [name] 01/05/13 9:30AM  8. Hyperphosphatemia - calcium levels are normal, patient has CKD. Most likely secondary  hyperparathyroidism. [ ]  f/u parathyroid hormone  FEN/GI: heart healthy diet PPx: Heparin subq  Disposition: pending po tolerance of food and medication  Subjective: No nausea/vomiting. Has been tolerating medications. No problems or complaints overnight. Says he is aware of his BP medication regimen and will take all prescribed medications as directed.   Objective: Temp:  [97.5 F (36.4 C)-97.9 F (36.6 C)] 97.9 F (36.6 C) (07/18 0643) Pulse Rate:  [69-71] 71 (07/18 0643) Resp:  [18-20] 18 (07/18 0643) BP: (134-158)/(85-96) 158/96 mmHg (07/18 0643) SpO2:  [100 %] 100 % (07/18 0643) Weight:  [250 lb 11.2 oz (113.717 kg)] 250 lb 11.2 oz (113.717 kg) (07/18 0500)  Physical Exam: General: Obese, no acute distress, awake Cardiovascular: RRR, no murmurs Respiratory: CTAB, no wheeze Abdomen: obese, soft, non-tender, non-distended Extremities: no edema  Laboratory:  Recent Labs Lab 12/08/12 0633 12/09/12 0445 12/10/12 0610  WBC 12.3* 9.9 10.0  HGB 9.0* 9.2* 9.5*  HCT 26.7* 26.7* 27.8*  PLT 205 215 222    Recent Labs Lab 12/07/12 1150 12/08/12 0633 12/09/12 0445 12/10/12 0610  NA 139 142 141 139  K 3.7 4.0 4.2 4.2  CL 104 110 109 107  CO2 20 22 22 22   BUN 39* 39* 37* 39*  CREATININE 3.58* 3.82* 3.75* 3.87*  CALCIUM 9.5 8.9 8.7 9.2  PROT 7.4  --   --   --   BILITOT 0.6  --   --   --   ALKPHOS 120*  --   --   --  ALT 9  --   --   --   AST 10  --   --   --   GLUCOSE 124* 100* 103* 117*    Imaging/Diagnostic Tests:  7/16 - MRI Abdomen IMPRESSION:  1. No evidence of adrenal mass.  2. No acute findings demonstrated.  3. Small amount of ascites, possibly related to renal  insufficiency - correlate clinically.  Vance Gather, MD 12/10/2012, 8:17 AM PGY-1, [facility] Intern pager: [phone], text pages welcome

## 2012-12-14 NOTE — Discharge Summary (Signed)
[facility] Discharge Summary  Patient name: Joshua [name] Medical record number: [id number] Date of birth: 1980-10-05 Age: 32 y.o. Gender: male Date of Admission: 12/07/2012  Date of Discharge: 12/10/2012  Admitting Physician: Dickie La, MD  Primary Care Provider: Provider Not In System Consultants: Nephrology  Indication for Hospitalization: Hypertensive Emergency  Discharge Diagnoses/Problem List:  1. Hypertensive Emergency 2. Nausea & Vomiting 3. Anemia of Chronic Disease 4. Chronic Kidney Disease 5. Secondary Hyperparathyroidism  Disposition: Home  Discharge Condition: Stable  Brief Hospital Course:   Joshua [name] was came in with a BP of 230/80 associated with headache and blurry vision. He was given amlodipine, carvedilol, clonidine, isosorbide mononitrate and morphine in the ED.  His blood pressure was better controlled down to 138/80 and his headache subsided. Troponin labs were negative. During his last hospital visit with the same presentation, metanephrines labs were obtained and showed a marked elevation of metanephrine and normetanephrine. Nephrology was consulted and an MRI was obtained which did not show any adrenal masses. His home medications were increased to their home dose. During his admission, Joshua [name] tolerated all diet advances. His anemia was stable and he was started on darbepoetin.  Issues for Follow Up:  1. Deteriorating renal function 2. Increased metanepherines  Significant Procedures: None  Significant Labs and Imaging:   Recent Labs Lab 12/08/12 0633 12/09/12 0445 12/10/12 0610  WBC 12.3* 9.9 10.0  HGB 9.0* 9.2* 9.5*  HCT 26.7* 26.7* 27.8*  PLT 205 215 222    Recent Labs Lab 12/08/12 0633 12/09/12 0445 12/10/12 0610  NA 142 141 139  K 4.0 4.2 4.2  CL 110 109 107  CO2 22 22 22   GLUCOSE 100* 103* 117*  BUN 39* 37* 39*  CREATININE 3.82* 3.75* 3.87*  CALCIUM 8.9 8.7 9.2  PHOS  --  4.9* 4.7*  ALBUMIN   --  2.6* 2.9*    Findings: Study is motion degraded. The patient reportedly fell  asleep during the examination.  There is no evidence of adrenal mass.  Both kidneys appear normal aside from a probable small parapelvic  cyst in the lower pole of the right kidney. There is no  hydronephrosis or perinephric abnormality.  The liver, gallbladder, biliary system, pancreas and spleen  demonstrate no abnormalities.  There is mild soft tissue edema and a small amount of ascites. No  focal extraluminal fluid collections are identified. There are no  pathologically enlarged lymph nodes.  IMPRESSION:  1. No evidence of adrenal mass.  2. No acute findings demonstrated.  3. Small amount of ascites, possibly related to renal  insufficiency - correlate clinically.   Outstanding Results: None  Discharge Medications:    Medication List         amLODipine 10 MG tablet  Commonly known as:  NORVASC  Take 1 tablet (10 mg total) by mouth daily.     aspirin 325 MG tablet  Take 325 mg by mouth daily.     carvedilol 25 MG tablet  Commonly known as:  COREG  Take 1 tablet (25 mg total) by mouth 2 (two) times daily.     carvedilol 25 MG tablet  Commonly known as:  COREG  Take 1 tablet (25 mg total) by mouth 2 (two) times daily with a meal.     cloNIDine 0.3 MG tablet  Commonly known as:  CATAPRES  Take 1 tablet (0.3 mg total) by mouth 3 (three) times daily.     ferrous sulfate 325 (65  FE) MG tablet  Take 325 mg by mouth 2 (two) times daily.     gabapentin 100 MG capsule  Commonly known as:  NEURONTIN  Take 3 capsules (300 mg total) by mouth 3 (three) times daily.     hydrALAZINE 100 MG tablet  Commonly known as:  APRESOLINE  Take 1 tablet (100 mg total) by mouth 3 (three) times daily.     isosorbide mononitrate 60 MG 24 hr tablet  Commonly known as:  IMDUR  Take 2 tablets (120 mg total) by mouth daily.     lisinopril 40 MG tablet  Commonly known as:  PRINIVIL,ZESTRIL  Take 1  tablet (40 mg total) by mouth 2 (two) times daily.     ondansetron 4 MG tablet  Commonly known as:  ZOFRAN  Take 1 tablet (4 mg total) by mouth every 6 (six) hours as needed for nausea.     traMADol 50 MG tablet  Commonly known as:  ULTRAM  Take 1 tablet (50 mg total) by mouth every 8 (eight) hours as needed.        Discharge Instructions: Please refer to Patient Instructions section of EMR for full details.  Patient was counseled important signs and symptoms that should prompt return to medical care, changes in medications, dietary instructions, activity restrictions, and follow up appointments.   Follow-Up Appointments:     Follow-up Information   Follow up with [facility] On 12/20/2012. (1:15 bring photo id meds and paperwork with $20 co pay, call Diane at [phone] to get apt for orange card)    Contact information:   [place] [id number] [phone]      Follow up with Louis Meckel, MD On 01/05/2013. (Appt is at 9:30, be there at 9)    Contact information:   [place] [address] [phone]       Cordelia Poche, MD 12/14/2012, 9:02 PM PGY-1, [place]

## 2012-12-15 NOTE — Discharge Summary (Signed)
Family Medicine Teaching Service  Discharge Note : Attending Mckenzy Salazar MD Pager [phone] Office [phone] I have seen and examined this patient, reviewed their chart and discussed discharge planning wit the resident at the time of discharge. I agree with the discharge plan as above.  

## 2012-12-20 ENCOUNTER — Inpatient Hospital Stay: Payer: Self-pay

## 2013-01-06 ENCOUNTER — Emergency Department (HOSPITAL_COMMUNITY): Payer: Medicaid Other

## 2013-01-06 ENCOUNTER — Encounter (HOSPITAL_COMMUNITY): Payer: Self-pay | Admitting: Emergency Medicine

## 2013-01-06 ENCOUNTER — Inpatient Hospital Stay (HOSPITAL_COMMUNITY)
Admission: EM | Admit: 2013-01-06 | Discharge: 2013-01-11 | DRG: 682 | Disposition: A | Discharge status: Home / Self Care | Payer: Medicaid Other | Attending: Family Medicine | Admitting: Family Medicine

## 2013-01-06 DIAGNOSIS — I5020 Unspecified systolic (congestive) heart failure: Secondary | ICD-10-CM

## 2013-01-06 DIAGNOSIS — Z79899 Other long term (current) drug therapy: Secondary | ICD-10-CM

## 2013-01-06 DIAGNOSIS — I4891 Unspecified atrial fibrillation: Secondary | ICD-10-CM | POA: Diagnosis present

## 2013-01-06 DIAGNOSIS — I429 Cardiomyopathy, unspecified: Secondary | ICD-10-CM

## 2013-01-06 DIAGNOSIS — Z7982 Long term (current) use of aspirin: Secondary | ICD-10-CM

## 2013-01-06 DIAGNOSIS — N184 Chronic kidney disease, stage 4 (severe): Secondary | ICD-10-CM

## 2013-01-06 DIAGNOSIS — R112 Nausea with vomiting, unspecified: Secondary | ICD-10-CM | POA: Diagnosis present

## 2013-01-06 DIAGNOSIS — E119 Type 2 diabetes mellitus without complications: Secondary | ICD-10-CM

## 2013-01-06 DIAGNOSIS — D631 Anemia in chronic kidney disease: Secondary | ICD-10-CM | POA: Diagnosis present

## 2013-01-06 DIAGNOSIS — E860 Dehydration: Secondary | ICD-10-CM | POA: Diagnosis present

## 2013-01-06 DIAGNOSIS — N179 Acute kidney failure, unspecified: Principal | ICD-10-CM | POA: Diagnosis present

## 2013-01-06 DIAGNOSIS — M129 Arthropathy, unspecified: Secondary | ICD-10-CM | POA: Diagnosis present

## 2013-01-06 DIAGNOSIS — E1129 Type 2 diabetes mellitus with other diabetic kidney complication: Secondary | ICD-10-CM | POA: Diagnosis present

## 2013-01-06 DIAGNOSIS — Z87891 Personal history of nicotine dependence: Secondary | ICD-10-CM

## 2013-01-06 DIAGNOSIS — G4733 Obstructive sleep apnea (adult) (pediatric): Secondary | ICD-10-CM | POA: Diagnosis present

## 2013-01-06 DIAGNOSIS — I509 Heart failure, unspecified: Secondary | ICD-10-CM | POA: Diagnosis present

## 2013-01-06 DIAGNOSIS — E1121 Type 2 diabetes mellitus with diabetic nephropathy: Secondary | ICD-10-CM | POA: Diagnosis present

## 2013-01-06 DIAGNOSIS — I161 Hypertensive emergency: Secondary | ICD-10-CM | POA: Diagnosis present

## 2013-01-06 DIAGNOSIS — I428 Other cardiomyopathies: Secondary | ICD-10-CM | POA: Diagnosis present

## 2013-01-06 DIAGNOSIS — Z91199 Patient's noncompliance with other medical treatment and regimen due to unspecified reason: Secondary | ICD-10-CM

## 2013-01-06 DIAGNOSIS — N289 Disorder of kidney and ureter, unspecified: Secondary | ICD-10-CM

## 2013-01-06 DIAGNOSIS — I10 Essential (primary) hypertension: Secondary | ICD-10-CM | POA: Diagnosis present

## 2013-01-06 DIAGNOSIS — Z9119 Patient's noncompliance with other medical treatment and regimen: Secondary | ICD-10-CM

## 2013-01-06 DIAGNOSIS — I5041 Acute combined systolic (congestive) and diastolic (congestive) heart failure: Secondary | ICD-10-CM | POA: Diagnosis present

## 2013-01-06 DIAGNOSIS — R51 Headache: Secondary | ICD-10-CM | POA: Diagnosis present

## 2013-01-06 DIAGNOSIS — D72829 Elevated white blood cell count, unspecified: Secondary | ICD-10-CM | POA: Diagnosis present

## 2013-01-06 DIAGNOSIS — N058 Unspecified nephritic syndrome with other morphologic changes: Secondary | ICD-10-CM | POA: Diagnosis present

## 2013-01-06 DIAGNOSIS — I129 Hypertensive chronic kidney disease with stage 1 through stage 4 chronic kidney disease, or unspecified chronic kidney disease: Secondary | ICD-10-CM | POA: Diagnosis present

## 2013-01-06 DIAGNOSIS — E8589 Other amyloidosis: Secondary | ICD-10-CM | POA: Diagnosis present

## 2013-01-06 LAB — CBC
HCT: 30.8 % — ABNORMAL LOW (ref 39.0–52.0)
MCV: 74.4 fL — ABNORMAL LOW (ref 78.0–100.0)
RBC: 4.14 MIL/uL — ABNORMAL LOW (ref 4.22–5.81)
RDW: 20 % — ABNORMAL HIGH (ref 11.5–15.5)
WBC: 18.1 10*3/uL — ABNORMAL HIGH (ref 4.0–10.5)

## 2013-01-06 LAB — BASIC METABOLIC PANEL
CO2: 19 mEq/L (ref 19–32)
Chloride: 98 mEq/L (ref 96–112)
Creatinine, Ser: 4.63 mg/dL — ABNORMAL HIGH (ref 0.50–1.35)

## 2013-01-06 LAB — PRO B NATRIURETIC PEPTIDE: Pro B Natriuretic peptide (BNP): 61864 pg/mL — ABNORMAL HIGH (ref 0–125)

## 2013-01-06 MED ORDER — NITROGLYCERIN 0.4 MG SL SUBL
0.4000 mg | SUBLINGUAL_TABLET | SUBLINGUAL | Status: DC | PRN
  Administered 2013-01-06: 0.4 mg via SUBLINGUAL

## 2013-01-06 MED ORDER — MORPHINE SULFATE 4 MG/ML IJ SOLN
4.0000 mg | Freq: Once | INTRAMUSCULAR | Status: AC
  Administered 2013-01-06: 4 mg via INTRAVENOUS
  Filled 2013-01-06: qty 1

## 2013-01-06 MED ORDER — NICARDIPINE HCL IN NACL 20-0.86 MG/200ML-% IV SOLN
5.0000 mg/h | Freq: Once | INTRAVENOUS | Status: AC
  Administered 2013-01-06: 5 mg/h via INTRAVENOUS
  Filled 2013-01-06: qty 200

## 2013-01-06 MED ORDER — NITROGLYCERIN IN D5W 200-5 MCG/ML-% IV SOLN
2.0000 ug/min | Freq: Once | INTRAVENOUS | Status: AC
  Administered 2013-01-06: 20 ug/min via INTRAVENOUS
  Filled 2013-01-06: qty 250

## 2013-01-06 MED ORDER — LABETALOL HCL 5 MG/ML IV SOLN
20.0000 mg | Freq: Once | INTRAVENOUS | Status: AC
  Administered 2013-01-06: 20 mg via INTRAVENOUS
  Filled 2013-01-06: qty 4

## 2013-01-06 NOTE — ED Provider Notes (Signed)
CSN: [id number]     Arrival date & time 01/06/13  1802 History     First MD Initiated Contact with Patient 01/06/13 1919     Chief Complaint  Patient presents with  . Chest Pain    HPI Patient reports worsening chest pain shortness of breath.  He has a history of atrial fibrillation.  He has a history of hypertension and is on Imdur, lisinopril, hydralazine, clonidine, Corag, Norvasc.  He reports he did not take his medications today.  Denies fevers and chills.  No nausea or vomiting.  No diarrhea.  Symptoms are mild to moderate in severity.  He has a history of coronary disease.  He has a history of chronic renal insufficiency.  His symptoms are moderate in severity  Past Medical History  Diagnosis Date  . Arthritis   . Chronic kidney disease   . Hypertension   . Atrial fibrillation   . Type II diabetes mellitus   . History of bronchitis     "when I was little" (12/07/2012)  . Sleep apnea     does not wear mask (12/07/2012)  . [id number])     "weekly" (12/07/2012)  . Anemia of chronic disease    Past Surgical History  Procedure Laterality Date  . No past surgeries     Family History  Problem Relation Age of Onset  . Diabetes Father   . Hypertension Father   . Hypertension Maternal Grandmother   . Hypertension Maternal Grandfather   . Kidney disease Maternal Grandfather   . Diabetes Maternal Grandfather    History  Substance Use Topics  . Smoking status: Former Smoker -- .1 years    Types: Cigarettes    Quit date: 09/24/1999  . Smokeless tobacco: Never Used  . Alcohol Use: No    Review of Systems  All other systems reviewed and are negative.    Allergies  Review of patient's allergies indicates no known allergies.  Home Medications   Current Outpatient Rx  Name  Route  Sig  Dispense  Refill  . amLODipine (NORVASC) 10 MG tablet   Oral   Take 1 tablet (10 mg total) by mouth daily.   30 tablet   3   . aspirin 325 MG tablet   Oral   Take 325 mg by  mouth daily.         . carvedilol (COREG) 25 MG tablet   Oral   Take 1 tablet (25 mg total) by mouth 2 (two) times daily.   60 tablet   2   . cloNIDine (CATAPRES) 0.3 MG tablet   Oral   Take 1 tablet (0.3 mg total) by mouth 3 (three) times daily.   60 tablet   3   . ferrous sulfate 325 (65 FE) MG tablet   Oral   Take 325 mg by mouth 2 (two) times daily.         Marland Kitchen gabapentin (NEURONTIN) 100 MG capsule   Oral   Take 3 capsules (300 mg total) by mouth 3 (three) times daily.   90 capsule   3   . hydrALAZINE (APRESOLINE) 100 MG tablet   Oral   Take 1 tablet (100 mg total) by mouth 3 (three) times daily.   90 tablet   3   . isosorbide mononitrate (IMDUR) 60 MG 24 hr tablet   Oral   Take 2 tablets (120 mg total) by mouth daily.   30 tablet   3   . lisinopril (PRINIVIL,ZESTRIL) 40  MG tablet   Oral   Take 1 tablet (40 mg total) by mouth 2 (two) times daily.   30 tablet   3   . ondansetron (ZOFRAN) 4 MG tablet   Oral   Take 1 tablet (4 mg total) by mouth every 6 (six) hours as needed for nausea.   20 tablet   0   . traMADol (ULTRAM) 50 MG tablet   Oral   Take 1 tablet (50 mg total) by mouth every 8 (eight) hours as needed.   12 tablet   0    BP 222/153  Pulse 115  Temp(Src) 97.9 F (36.6 C) (Oral)  Resp 26  SpO2 100% Physical Exam  Nursing note and vitals reviewed. Constitutional: He is oriented to person, place, and time. He appears well-developed and well-nourished.  HENT:  Head: Normocephalic and atraumatic.  Eyes: EOM are normal.  Neck: Normal range of motion.  Cardiovascular: Normal rate, regular rhythm, normal heart sounds and intact distal pulses.   Pulmonary/Chest: Effort normal and breath sounds normal. No respiratory distress.  Abdominal: Soft. He exhibits no distension. There is no tenderness.  Genitourinary: Rectum normal.  Musculoskeletal: Normal range of motion.  Neurological: He is alert and oriented to person, place, and time.  Skin:  Skin is warm and dry.  Psychiatric: He has a normal mood and affect. Judgment normal.    ED Course   Procedures (including critical care time)   Date: 01/06/2013  Rate: 114  Rhythm: normal sinus rhythm  QRS Axis: normal  Intervals: normal  ST/T Wave abnormalities: normal  Conduction Disutrbances: none  Narrative Interpretation: LVH  Old EKG Reviewed: No significant changes noted  CRITICAL CARE Performed by: Hoy Morn Total critical care time: 35 Critical care time was exclusive of separately billable procedures and treating other patients. Critical care was necessary to treat or prevent imminent or life-threatening deterioration. Critical care was time spent personally by me on the following activities: development of treatment plan with patient and/or surrogate as well as nursing, discussions with consultants, evaluation of patient's response to treatment, examination of patient, obtaining history from patient or surrogate, ordering and performing treatments and interventions, ordering and review of laboratory studies, ordering and review of radiographic studies, pulse oximetry and re-evaluation of patient's condition.    Labs Reviewed  BASIC METABOLIC PANEL - Abnormal; Notable for the following:    Sodium 133 (*)    Potassium 5.3 (*)    Glucose, Bld 143 (*)    BUN 59 (*)    Creatinine, Ser 4.63 (*)    GFR calc non Af Amer 15 (*)    GFR calc Af Amer 18 (*)    All other components within normal limits  CBC - Abnormal; Notable for the following:    WBC 18.1 (*)    RBC 4.14 (*)    Hemoglobin 10.1 (*)    HCT 30.8 (*)    MCV 74.4 (*)    MCH 24.4 (*)    RDW 20.0 (*)    All other components within normal limits  PRO B NATRIURETIC PEPTIDE  POCT I-STAT TROPONIN I   Dg Chest 2 View  01/06/2013   *RADIOLOGY REPORT*  Clinical Data: Severe shortness of breath  CHEST - 2 VIEW  Comparison: 09/26/2012  Findings: Cardiomegaly is noted, slightly more prominent than previously,  with central vascular congestion and Kerley B lines indicating interstitial edema.  No pleural effusion.  No acute osseous finding.  IMPRESSION: Cardiomegaly with interstitial edema.  No  focal acute finding.   Original Report Authenticated By: Conchita Paris, M.D.   I personally reviewed the imaging tests through PACS system I reviewed available ER/hospitalization records through the EMR   1. Hypertensive emergency   2. Renal insufficiency     MDM  Patient presents with what appears to be hypertensive emergency with severe hypertension and shortness of breath.  EKG demonstrates LVH but no other abnormalities.  Initial troponin is negative.  The patient be admitted to the step down unit.  Family practice service to admit  Hoy Morn, MD 01/06/13 [phone]

## 2013-01-06 NOTE — ED Notes (Signed)
MD at bedside. 

## 2013-01-06 NOTE — ED Notes (Signed)
Pt brought to ED by EMS with chest pain.As per pt he has chest pain since early this morning ,radiating to the left arm from central chest accompanied with SOB.

## 2013-01-07 ENCOUNTER — Encounter (HOSPITAL_COMMUNITY): Payer: Self-pay | Admitting: *Deleted

## 2013-01-07 DIAGNOSIS — I359 Nonrheumatic aortic valve disorder, unspecified: Secondary | ICD-10-CM

## 2013-01-07 DIAGNOSIS — R112 Nausea with vomiting, unspecified: Secondary | ICD-10-CM

## 2013-01-07 LAB — TROPONIN I
Troponin I: <0.3 ng/mL (ref <0.30)
Troponin I: <0.3 ng/mL (ref <0.30)

## 2013-01-07 LAB — URINALYSIS, ROUTINE W REFLEX MICROSCOPIC
Leukocytes, UA: NEGATIVE
Nitrite: NEGATIVE
Specific Gravity, Urine: 1.013 (ref 1.005–1.030)
Urobilinogen, UA: 1 mg/dL (ref 0.0–1.0)
pH: 6.5 (ref 5.0–8.0)

## 2013-01-07 LAB — RAPID URINE DRUG SCREEN, HOSP PERFORMED
Barbiturates: NOT DETECTED
Cocaine: NOT DETECTED
Tetrahydrocannabinol: NOT DETECTED

## 2013-01-07 LAB — MAGNESIUM: Magnesium: 2.2 mg/dL (ref 1.5–2.5)

## 2013-01-07 LAB — CBC
HCT: 28.7 % — ABNORMAL LOW (ref 39.0–52.0)
MCHC: 32.4 g/dL (ref 30.0–36.0)
MCV: 74 fL — ABNORMAL LOW (ref 78.0–100.0)
Platelets: 277 10*3/uL (ref 150–400)
RDW: 19.9 % — ABNORMAL HIGH (ref 11.5–15.5)
WBC: 17.8 10*3/uL — ABNORMAL HIGH (ref 4.0–10.5)

## 2013-01-07 LAB — BASIC METABOLIC PANEL
BUN: 61 mg/dL — ABNORMAL HIGH (ref 6–23)
CO2: 23 mEq/L (ref 19–32)
Calcium: 9.1 mg/dL (ref 8.4–10.5)
Creatinine, Ser: 4.91 mg/dL — ABNORMAL HIGH (ref 0.50–1.35)
GFR calc Af Amer: 17 mL/min — ABNORMAL LOW (ref >90)

## 2013-01-07 MED ORDER — AMLODIPINE BESYLATE 10 MG PO TABS: 10.0000 mg | ORAL_TABLET | Freq: Every day | ORAL | Status: DC

## 2013-01-07 MED ORDER — SODIUM CHLORIDE 0.9 % IV SOLN: 250.0000 mL | INTRAVENOUS | Status: DC | PRN

## 2013-01-07 MED ORDER — PERFLUTREN LIPID MICROSPHERE
INTRAVENOUS | Status: AC
  Administered 2013-01-07: 19:00:00
  Filled 2013-01-07: qty 10

## 2013-01-07 MED ORDER — CLONIDINE HCL 0.3 MG PO TABS
0.3000 mg | ORAL_TABLET | Freq: Three times a day (TID) | ORAL | Status: DC
  Administered 2013-01-07 – 2013-01-11 (×14): 0.3 mg via ORAL
  Filled 2013-01-07 (×15): qty 1

## 2013-01-07 MED ORDER — NITROGLYCERIN IN D5W 200-5 MCG/ML-% IV SOLN
2.0000 ug/min | INTRAVENOUS | Status: DC
  Administered 2013-01-07 (×2): 200 ug/min via INTRAVENOUS
  Administered 2013-01-08: 165 ug/min via INTRAVENOUS
  Administered 2013-01-08: 170 ug/min via INTRAVENOUS
  Administered 2013-01-08: 165 ug/min via INTRAVENOUS
  Administered 2013-01-09: 120 ug/min via INTRAVENOUS
  Filled 2013-01-07 (×9): qty 250

## 2013-01-07 MED ORDER — NITROGLYCERIN IN D5W 200-5 MCG/ML-% IV SOLN
INTRAVENOUS | Status: AC
  Filled 2013-01-07: qty 250

## 2013-01-07 MED ORDER — MORPHINE SULFATE 2 MG/ML IJ SOLN
2.0000 mg | INTRAMUSCULAR | Status: DC | PRN
  Administered 2013-01-08 – 2013-01-09 (×3): 2 mg via INTRAVENOUS
  Filled 2013-01-07 (×3): qty 1

## 2013-01-07 MED ORDER — LISINOPRIL 40 MG PO TABS
40.0000 mg | ORAL_TABLET | Freq: Two times a day (BID) | ORAL | Status: DC
  Administered 2013-01-07 (×3): 40 mg via ORAL
  Filled 2013-01-07 (×2): qty 1
  Filled 2013-01-07: qty 2
  Filled 2013-01-07: qty 1

## 2013-01-07 MED ORDER — ACETAMINOPHEN 650 MG RE SUPP: 650.0000 mg | Freq: Four times a day (QID) | RECTAL | Status: DC | PRN

## 2013-01-07 MED ORDER — FUROSEMIDE 10 MG/ML IJ SOLN
40.0000 mg | Freq: Once | INTRAMUSCULAR | Status: AC
  Administered 2013-01-07: 40 mg via INTRAVENOUS
  Filled 2013-01-07: qty 4

## 2013-01-07 MED ORDER — SODIUM CHLORIDE 0.9 % IJ SOLN
3.0000 mL | Freq: Two times a day (BID) | INTRAMUSCULAR | Status: DC
  Administered 2013-01-07 – 2013-01-11 (×5): 3 mL via INTRAVENOUS

## 2013-01-07 MED ORDER — ACETAMINOPHEN 325 MG PO TABS: 650.0000 mg | ORAL_TABLET | Freq: Four times a day (QID) | ORAL | Status: DC | PRN

## 2013-01-07 MED ORDER — ASPIRIN 325 MG PO TABS
325.0000 mg | ORAL_TABLET | Freq: Every day | ORAL | Status: DC
  Administered 2013-01-07 – 2013-01-11 (×5): 325 mg via ORAL
  Filled 2013-01-07 (×5): qty 1

## 2013-01-07 MED ORDER — CARVEDILOL 25 MG PO TABS
25.0000 mg | ORAL_TABLET | Freq: Two times a day (BID) | ORAL | Status: DC
  Administered 2013-01-07 – 2013-01-11 (×9): 25 mg via ORAL
  Filled 2013-01-07 (×10): qty 1

## 2013-01-07 MED ORDER — GABAPENTIN 300 MG PO CAPS
300.0000 mg | ORAL_CAPSULE | Freq: Three times a day (TID) | ORAL | Status: DC
  Administered 2013-01-07 – 2013-01-11 (×14): 300 mg via ORAL
  Filled 2013-01-07 (×15): qty 1

## 2013-01-07 MED ORDER — CARVEDILOL 25 MG PO TABS
25.0000 mg | ORAL_TABLET | Freq: Two times a day (BID) | ORAL | Status: DC
  Administered 2013-01-07: 25 mg via ORAL
  Filled 2013-01-07 (×3): qty 1

## 2013-01-07 MED ORDER — ONDANSETRON HCL 4 MG PO TABS: 4.0000 mg | ORAL_TABLET | Freq: Four times a day (QID) | ORAL | Status: DC | PRN

## 2013-01-07 MED ORDER — ENOXAPARIN SODIUM 40 MG/0.4ML ~~LOC~~ SOLN
40.0000 mg | SUBCUTANEOUS | Status: DC
  Administered 2013-01-07 – 2013-01-11 (×5): 40 mg via SUBCUTANEOUS
  Filled 2013-01-07 (×5): qty 0.4

## 2013-01-07 MED ORDER — NITROGLYCERIN IN D5W 200-5 MCG/ML-% IV SOLN
2.0000 ug/min | Freq: Once | INTRAVENOUS | Status: AC
  Administered 2013-01-07: 20 ug/min via INTRAVENOUS

## 2013-01-07 MED ORDER — FERROUS SULFATE 325 (65 FE) MG PO TABS
325.0000 mg | ORAL_TABLET | Freq: Two times a day (BID) | ORAL | Status: DC
  Administered 2013-01-07 – 2013-01-11 (×9): 325 mg via ORAL
  Filled 2013-01-07 (×10): qty 1

## 2013-01-07 MED ORDER — AMLODIPINE BESYLATE 10 MG PO TABS
10.0000 mg | ORAL_TABLET | Freq: Every day | ORAL | Status: DC
Start: 2013-01-07 — End: 2013-01-10
  Administered 2013-01-07 – 2013-01-09 (×3): 10 mg via ORAL
  Filled 2013-01-07 (×4): qty 1

## 2013-01-07 MED ORDER — SODIUM CHLORIDE 0.9 % IJ SOLN: 3.0000 mL | INTRAMUSCULAR | Status: DC | PRN

## 2013-01-07 MED ORDER — ONDANSETRON HCL 4 MG PO TABS
2.0000 mg | ORAL_TABLET | Freq: Four times a day (QID) | ORAL | Status: DC | PRN
  Administered 2013-01-08 (×2): 2 mg via ORAL
  Filled 2013-01-07 (×2): qty 1

## 2013-01-07 NOTE — Progress Notes (Signed)
See addendum to admission H&P for today's attending assessment and plan.   Joshua [name] C 3:10 PM 01/07/2013

## 2013-01-07 NOTE — Progress Notes (Signed)
Family practice MD was notified about current BP 195/123 (as the best so far), continue to titrate Nitroglycerin drip and monitor pt.

## 2013-01-07 NOTE — Progress Notes (Signed)
Family Medicine Teaching Service Daily Progress Note Intern Pager: [phone]  Patient name: Solomone Tyrie Medical record number: [id number] Date of birth: 1980/08/18 Age: 32 y.o. Gender: male  Primary Care Provider: Provider Not In System Consultants: none  Code Status: Full   Pt Overview and Major Events to Date:  8/15 - Nitro drip initiated   Assessment and Plan: Dayjon Edmon is a 32 y.o. year old male presenting with headache and BP of 254/174 . PMH is significant for hypertensive emergency, DM2, anemia of chronic disease, and CKD stage IV.   #Hypertensive emergency - with BP of 254/174 and Headache, denies chest pain now but charted as having it on presentation to the ED. - continue nitro drip and restart home meds partially as follows: norvasc 10 mg qd, Coreg 25 BID , lisinopril 40 daily  - will start clonidine 0.3 TID, imdur 120 qd, and hydralazine 100 BID today in a stepwise fashion  - morning EKG so no change from ED EKG, ED EKG with on LVH and sinus tachycardia  [ ]  f/u troponin   #DM2  - A1C 5.5 on 7/15  - Monitor on BMP, carb modified diet  [ ]  f/u Glucose in am   #CKD IV  - Cre 4.6-->4.91, 3.5 baseline  - Likely due to severe uncontrolled HTN  - potasium 5.3-->5.0, no signs of hyperkalemia on EKG, will treat with kayexalate or calcium as needed.  - Will allow PO fluids and trend creatinine with daily BMP   #Anemia of chronic disease  - Hgb 10.1-->9.3, around baseline of 10.  - also microcytic, continue iron sulfate   FEN/GI: Carb modified heart healthy diet, saline lock IV  Prophylaxis: Lovenox  Disposition: Admit to step down, home when BP controlled  Subjective: Patient had no overnight events. His headache feels better today than yesterday. Rates it a 7/10 and global distribution pattern. Feels much better as compared to yesterday.   Objective: Temp:  [97.5 F (36.4 C)-98.4 F (36.9 C)] 97.5 F (36.4 C) (08/15 0811) Pulse Rate:  [47-123] 123 (08/15  0811) Resp:  [0-46] 17 (08/15 0811) BP: (161-245)/(100-185) 174/100 mmHg (08/15 0811) SpO2:  [88 %-100 %] 100 % (08/15 0811) Weight:  [270 lb 11.6 oz (122.8 kg)] 270 lb 11.6 oz (122.8 kg) (08/15 0245) Physical Exam: Gen: NAD, easy to arouse, no confusion, cooperative on exam   HEENT: NCAT, , MMM  CV: RRR, good S1/S2, no murmur  Resp: CTABL, no wheezes, non-labored  Abd: SNTND, BS present, no guarding or organomegaly  Ext: No edema, warm  Neuro: Alert and oriented   Laboratory:  Recent Labs Lab 01/06/13 1847 01/07/13 0415  WBC 18.1* 17.8*  HGB 10.1* 9.3*  HCT 30.8* 28.7*  PLT 293 277    Recent Labs Lab 01/06/13 1847 01/07/13 0415  NA 133* 135  K 5.3* 5.0  CL 98 98  CO2 19 23  BUN 59* 61*  CREATININE 4.63* 4.91*  CALCIUM 9.1 9.1  GLUCOSE 143* 144*      Imaging/Diagnostic Tests: ED EKG reviewed showing sinus tach and non-specific T wave abnormalities, likely LVH  CXR 01/06/2013  IMPRESSION:  Cardiomegaly with interstitial edema. No focal acute finding.   Clearance Coots, MD 01/07/2013, 9:45 AM PGY-1, [place] Intern pager: [phone], text pages welcome

## 2013-01-07 NOTE — Progress Notes (Signed)
  Echocardiogram 2D Echocardiogram with Definity has been performed.  Joshua [name] FRANCES 01/07/2013, 7:06 PM

## 2013-01-07 NOTE — Progress Notes (Signed)
Update from this AM. UDS positive for opiods, no cocaine. Restarted coreg.   Addition to A/P:  Acute on chronic kidney disease: monitor Cr. K+ improved. Diuresis.   Boykin Nearing C 11:36 AM 01/07/2013

## 2013-01-07 NOTE — H&P (Signed)
Attending Addendum  I examined the patient and discussed the assessment and plan with Dr. Wendi Snipes. I have reviewed the note and agree.  Briefly, 32 yo M presenting with second episode of hypertensive emergency. Following gradually worsening frontal headache that started yesterday AM then developing nausea, emesis and SOB. Patient denies ETOH and IDU. Nausea and SOB have resolved this AM. headache persist. No other complaints.   O: VSS BP trending down slowly with nitro gtt and restarting a few home meds.  Gen: awake alert. NAD. Mild increase WOB.  Chest: S1S2 RRR, CTA b/l.  Abd: obese, soft, NT, NABS Ext: no edema Neuro: grossly normal. Pupil constricted and minimally reactive b/l.  CXR: significant cardiomegaly with interstitial edema.   A/P: Recurrent hypertensive emergency in the setting of resistant hypertension improving.  1. HTN emergency:  A: improving  P: continue nitro gtt. Continue to monitor. Goal is 25% reduction in MAP over 24 hrs.   f/u UDS: hold BB until resulted. Agree with plan to start lasix for diuresis and BP reduction  in the setting of cardiomegaly. Agree with ECHO.  Neuro checks over the next day.   2. Headache: related to nitro and elevated BP. Continue nitro as per above. Tylenol prn.   Joshua [name], [place]

## 2013-01-07 NOTE — H&P (Signed)
[facility] Admission History and Physical Service Pager: [phone]  Patient name: Joshua [name] Medical record number: [id number] Date of birth: 1981-02-10 Age: 32 y.o. Gender: male  Primary Care Provider: [facility] - not routinely seen there.  Consultants: None Code Status: Full  Chief Complaint: Headache  Assessment and Plan: Joshua [name] is a 32 y.o. year old male presenting with headache and BP of 254/174  . PMH is significant for hypertensive emergency, DM2, anemia of chronic disease, and CKD stage IV.   Hypertensive emergency - with BP of 254/174 and  Headache, denies chest pain now but charted as having it on presentation to the ED. Also with confusion on orientation to time.  - admit to step down unit - continue nitro drip and restart home meds partially as follows: norvasc 10 mg qd, Coreg 25 BID , lisinopril 40 daily  - will start clonidine 0.3 TID, imdur 120 qd, and hydralazine 100 BID tomorrow am in a stepwise fashion - With previous chest pain will rule out with morning EKG and troponins, ED EKG with on LVH and sinus tachycardia [ ]  f/u troponin [ ]  f/u am EKG  DM2 - A1C 5.5 on 7/15 - Monitor on BMP, carb modified diet [ ]  f/u Glucose in am  CKD IV - Cre 4.6 tonight up from 3.5 baseline - Likely due to severe uncontrolled HTN - potasium 5.3 on BMP tonight, no signs of hyperkalemia on EKG, will treat with kayexalate or calcium as needed.  - Will allow PO fluids and trend creatinine with daily BMP [ ]  f/u morning BMP  Anemia of chronic disease - Hgb 10.1 tonight, around baseline of 10.  - also microcytic,  continue iron sulfate  FEN/GI: Carb modified heart healthy diet, saline lock IV Prophylaxis: Lovenox  Disposition: Admit to step down, home when BP controlled  History of Present Illness: Joshua [name] is a 32 y.o. year old male presenting with headache and likely chest pain after missing his hypertension meds for 2 days.  He cannot explain why he missed them but states that he is" probably out by now." He has not been seeing any physicians since he was last discharged. He states that he does watch his diet limiting both salt and sugar intake. He states that he is mostly compliant with his meds. He denies chest pain currently, dyspnea, palpitations, abdominal pain, loose stools, weakness, or confusion. He also denies drug and tobacco use. He states that he has been living with a roommate in an apartment in town.    Review Of Systems: Per HPI with the following additions, otherwise 12 point review of systems was performed and was unremarkable.  Patient Active Problem List   Diagnosis Date Noted  . Dehydration 09/29/2012  . Hypertensive emergency 09/29/2012  . Chronic kidney disease (CKD), stage IV (severe) 09/29/2012  . Nausea and vomiting 09/29/2012  . Anemia in chronic kidney disease 09/29/2012  . Leukocytosis, unspecified 09/29/2012  . Diabetes 09/02/2012  . Diabetic kidney disease 09/02/2012  . HTN (hypertension), benign 09/02/2012   Past Medical History: Past Medical History  Diagnosis Date  . Arthritis   . Chronic kidney disease   . Hypertension   . Atrial fibrillation   . Type II diabetes mellitus   . History of bronchitis     "when I was little" (12/07/2012)  . Sleep apnea     does not wear mask (12/07/2012)  . [id number])     "weekly" (12/07/2012)  . Anemia  of chronic disease    Past Surgical History: Past Surgical History  Procedure Laterality Date  . No past surgeries     Social History: History  Substance Use Topics  . Smoking status: Former Smoker -- .1 years    Types: Cigarettes    Quit date: 09/24/1999  . Smokeless tobacco: Never Used  . Alcohol Use: No   Additional social history: Please also refer to relevant sections of EMR.  Family History: Family History  Problem Relation Age of Onset  . Diabetes Father   . Hypertension Father   . Hypertension Maternal  Grandmother   . Hypertension Maternal Grandfather   . Kidney disease Maternal Grandfather   . Diabetes Maternal Grandfather    Allergies and Medications: No Known Allergies No current facility-administered medications on file prior to encounter.   Current Outpatient Prescriptions on File Prior to Encounter  Medication Sig Dispense Refill  . amLODipine (NORVASC) 10 MG tablet Take 1 tablet (10 mg total) by mouth daily.  30 tablet  3  . aspirin 325 MG tablet Take 325 mg by mouth daily.      . carvedilol (COREG) 25 MG tablet Take 1 tablet (25 mg total) by mouth 2 (two) times daily.  60 tablet  2  . cloNIDine (CATAPRES) 0.3 MG tablet Take 1 tablet (0.3 mg total) by mouth 3 (three) times daily.  60 tablet  3  . ferrous sulfate 325 (65 FE) MG tablet Take 325 mg by mouth 2 (two) times daily.      Marland Kitchen gabapentin (NEURONTIN) 100 MG capsule Take 3 capsules (300 mg total) by mouth 3 (three) times daily.  90 capsule  3  . hydrALAZINE (APRESOLINE) 100 MG tablet Take 1 tablet (100 mg total) by mouth 3 (three) times daily.  90 tablet  3  . isosorbide mononitrate (IMDUR) 60 MG 24 hr tablet Take 2 tablets (120 mg total) by mouth daily.  30 tablet  3  . lisinopril (PRINIVIL,ZESTRIL) 40 MG tablet Take 1 tablet (40 mg total) by mouth 2 (two) times daily.  30 tablet  3  . ondansetron (ZOFRAN) 4 MG tablet Take 1 tablet (4 mg total) by mouth every 6 (six) hours as needed for nausea.  20 tablet  0  . traMADol (ULTRAM) 50 MG tablet Take 1 tablet (50 mg total) by mouth every 8 (eight) hours as needed.  12 tablet  0    Objective: BP 166/104  Pulse 98  Temp(Src) 97.9 F (36.6 C) (Oral)  Resp 40  SpO2 92% Exam: Gen: NAD, somnolent, slightly difficult to arouse but maintained consciousness easily when he woke up.  HEENT: NCAT, EOMI, PERRL, MMM CV: RRR, good S1/S2, no murmur Resp: CTABL, no wheezes, non-labored Abd: SNTND, BS present, no guarding or organomegaly Ext: No edema, warm Neuro: Alert and oriented to  person and place, believes it is Saturday and January for month. Strength 5/5 and sensation intact in all 4 extremities, CN 2-12 intact.   Labs and Imaging: CBC BMET   Recent Labs Lab 01/06/13 1847  WBC 18.1*  HGB 10.1*  HCT 30.8*  PLT 293    Recent Labs Lab 01/06/13 1847  NA 133*  K 5.3*  CL 98  CO2 19  BUN 59*  CREATININE 4.63*  GLUCOSE 143*  CALCIUM 9.1     ED EKG reviewed showing sinus tach and non-specific T wave abnormalities, likely LVH  CXR 01/06/2013 IMPRESSION:  Cardiomegaly with interstitial edema. No focal acute finding.   Sherley Bounds  Wendi Snipes, MD 01/07/2013, 1:17 AM PGY-2, [place] Intern pager: [phone], text pages welcome

## 2013-01-07 NOTE — Progress Notes (Signed)
Nutrition Brief Note  Patient identified on the Malnutrition Screening Tool (MST) Report for unintentional weight loss. Patient reports that he has lost ~ 50 lbs. Per review of usual weights below, patient's weight has been variable over the past 4 months, likely related to fluid status with heart failure. Patient reports that he has been eating very well, follows a low sodium diet at home.  Wt Readings from Last 15 Encounters:  01/07/13 270 lb 11.6 oz (122.8 kg)  12/10/12 250 lb 11.2 oz (113.717 kg)  09/28/12 237 lb (107.502 kg)  09/02/12 261 lb 3.2 oz (118.48 kg)    Body mass index is 36.71 kg/(m^2). Patient meets criteria for obesity, class 2 based on current BMI.   Current diet order is low sodium, patient is consuming approximately 100% of meals at this time. Labs and medications reviewed.   No nutrition interventions warranted at this time. If nutrition issues arise, please consult RD.   Molli Barrows, RD, LDN, [place] Pager [phone] After Hours Pager [phone]

## 2013-01-07 NOTE — Care Management Note (Signed)
    Page 1 of 1   01/07/2013     2:36:11 PM   CARE MANAGEMENT NOTE 01/07/2013  Patient:  QUAN, FORNEY   Account Number:  [id number]  Date Initiated:  01/07/2013  Documentation initiated by:  Elissa Hefty  Subjective/Objective Assessment:   adm w htn crisis     Action/Plan:   lives w friend   Anticipated DC Date:     Anticipated DC Plan:        DC Planning Services  CM consult      Choice offered to / List presented to:             Status of service:   Medicare Important Message given?   (If response is "NO", the following Medicare IM given date fields will be blank) Date Medicare IM given:   Date Additional Medicare IM given:    Discharge Disposition:    Per UR Regulation:  Reviewed for med. necessity/level of care/duration of stay  If discussed at [place] of Stay Meetings, dates discussed:    Comments:  8/15 1434 debbie Edye Hainline rn,bsn left pt [place] [facility] primary care resource list. left pt 2 pharmacy discount cards.

## 2013-01-07 NOTE — ED Notes (Signed)
Nicardipine stopped per verbal order by Dr. Wendi Snipes. Nitro drip restarted per Dr. Wendi Snipes

## 2013-01-08 ENCOUNTER — Encounter (HOSPITAL_COMMUNITY): Payer: Self-pay | Admitting: Cardiology

## 2013-01-08 DIAGNOSIS — D72829 Elevated white blood cell count, unspecified: Secondary | ICD-10-CM

## 2013-01-08 DIAGNOSIS — N189 Chronic kidney disease, unspecified: Secondary | ICD-10-CM

## 2013-01-08 DIAGNOSIS — N289 Disorder of kidney and ureter, unspecified: Secondary | ICD-10-CM

## 2013-01-08 DIAGNOSIS — I10 Essential (primary) hypertension: Secondary | ICD-10-CM

## 2013-01-08 DIAGNOSIS — N039 Chronic nephritic syndrome with unspecified morphologic changes: Secondary | ICD-10-CM

## 2013-01-08 DIAGNOSIS — I5020 Unspecified systolic (congestive) heart failure: Secondary | ICD-10-CM | POA: Clinically undetermined

## 2013-01-08 DIAGNOSIS — E1129 Type 2 diabetes mellitus with other diabetic kidney complication: Secondary | ICD-10-CM

## 2013-01-08 DIAGNOSIS — I429 Cardiomyopathy, unspecified: Secondary | ICD-10-CM

## 2013-01-08 DIAGNOSIS — N058 Unspecified nephritic syndrome with other morphologic changes: Secondary | ICD-10-CM

## 2013-01-08 LAB — CBC
HCT: 26 % — ABNORMAL LOW (ref 39.0–52.0)
MCH: 24.2 pg — ABNORMAL LOW (ref 26.0–34.0)
MCV: 72.2 fL — ABNORMAL LOW (ref 78.0–100.0)
RDW: 19.2 % — ABNORMAL HIGH (ref 11.5–15.5)
WBC: 13.1 10*3/uL — ABNORMAL HIGH (ref 4.0–10.5)

## 2013-01-08 LAB — BASIC METABOLIC PANEL
BUN: 63 mg/dL — ABNORMAL HIGH (ref 6–23)
CO2: 25 mEq/L (ref 19–32)
Chloride: 100 mEq/L (ref 96–112)
Creatinine, Ser: 4.69 mg/dL — ABNORMAL HIGH (ref 0.50–1.35)
Glucose, Bld: 127 mg/dL — ABNORMAL HIGH (ref 70–99)

## 2013-01-08 LAB — TROPONIN I: Troponin I: <0.3 ng/mL (ref <0.30)

## 2013-01-08 MED ORDER — FUROSEMIDE 10 MG/ML IJ SOLN: 40.0000 mg | Freq: Two times a day (BID) | INTRAMUSCULAR | Status: DC

## 2013-01-08 MED ORDER — HYDRALAZINE HCL 50 MG PO TABS
100.0000 mg | ORAL_TABLET | Freq: Three times a day (TID) | ORAL | Status: DC
  Administered 2013-01-08 – 2013-01-11 (×11): 100 mg via ORAL
  Filled 2013-01-08 (×13): qty 2

## 2013-01-08 MED ORDER — LISINOPRIL 40 MG PO TABS
40.0000 mg | ORAL_TABLET | Freq: Every day | ORAL | Status: DC
  Administered 2013-01-08 – 2013-01-11 (×4): 40 mg via ORAL
  Filled 2013-01-08 (×4): qty 1

## 2013-01-08 MED ORDER — FUROSEMIDE 10 MG/ML IJ SOLN
40.0000 mg | Freq: Once | INTRAMUSCULAR | Status: AC
  Administered 2013-01-08: 40 mg via INTRAVENOUS
  Filled 2013-01-08: qty 4

## 2013-01-08 NOTE — Consult Note (Signed)
Consulting cardiologist: Dr. Satira Sark  Clinical Summary Joshua [name] is a 32 y.o.male presently admitted to the family medicine teaching service with hypertensive emergency, symptoms of regular headaches, also reported noncompliance with regular medical therapy. Concurrent problems include type 2 diabetes mellitus with recent hemoglobin A1c 5.5, stage IV chronic kidney disease followed by nephrology, anemia of chronic disease, obstructive sleep apnea not on CPAP treatment. Recently admitted in July with similar circumstances. Medical therapy was adjusted, secondary causes for hypertension have been explored. He did have increased urinary metanephrines and normetanephrines, however MRI did not find any obvious adrenal mass to suggest pheochromocytoma. The patient had an echocardiogram done during this admission which indicated moderate to severe LVH with diffuse hypokinesis, LVEF approximately 30%. Myocardium was described as somewhat bright, also question of mild non-compaction at the apex. Possibility of considering amyloidosis was suggested.  No evidence of ACS by cardiac enzymes. Chest x-ray does describe interstitial edema with cardiomegaly. Blood pressure coming down on multimodal therapy. ECG shows diffuse repolarization abnormalities and prolonged QT with some element of TU fusion.   No Known Allergies  Medications Scheduled Medications: . amLODipine  10 mg Oral Daily  . aspirin  325 mg Oral Daily  . carvedilol  25 mg Oral BID WC  . cloNIDine  0.3 mg Oral TID  . enoxaparin (LOVENOX) injection  40 mg Subcutaneous Q24H  . ferrous sulfate  325 mg Oral BID  . gabapentin  300 mg Oral TID  . hydrALAZINE  100 mg Oral TID  . lisinopril  40 mg Oral Daily  . sodium chloride  3 mL Intravenous Q12H    Infusions: . nitroGLYCERIN 165 mcg/min (01/08/13 1211)    PRN Medications: sodium chloride, acetaminophen, acetaminophen, morphine injection, nitroGLYCERIN, ondansetron, sodium  chloride   Past Medical History  Diagnosis Date  . Arthritis   . CKD (chronic kidney disease) stage 4, GFR 15-29 ml/min   . Hypertension     Hypertensive urgency/emergency - no RAS, normal aldo/PRA ratio,high urine metanephrines and normetanephrines   . Atrial fibrillation   . Type II diabetes mellitus   . History of bronchitis   . Sleep apnea     No CPAP  . Headache(784.0)     Regular  . Anemia of chronic disease     Past Surgical History  Procedure Laterality Date  . No past surgeries      Family History  Problem Relation Age of Onset  . Diabetes Father   . Hypertension Father   . Hypertension Maternal Grandmother   . Hypertension Maternal Grandfather   . Kidney disease Maternal Grandfather   . Diabetes Maternal Grandfather     Social History [name] reports that he quit smoking about 13 years ago. His smoking use included Cigarettes. He smoked 0.00 packs per day for .1 years. He has never used smokeless tobacco. Mr. Boedeker reports that he does not drink alcohol.  Review of Systems No chest pain or palpitations, no syncope. Recent problems with nausea and emesis, malaise. No obvious bleeding in the stool. Appetite fair. Otherwise negative.  Physical Examination Blood pressure 173/106, pulse 72, temperature 97.9 F (36.6 C), temperature source Oral, resp. rate 11, height 6' (1.829 m), weight 270 lb 11.6 oz (122.8 kg), SpO2 97.00%.  Intake/Output Summary (Last 24 hours) at 01/08/13 1338 Last data filed at 01/08/13 1200  Gross per 24 hour  Intake 1695.5 ml  Output   2750 ml  Net -1054.5 ml   Obese male, no distress. HEENT:  Conjunctiva and lids normal, oropharynx clear. Neck: Supple, increased girth, difficult to assess JVP, no carotid bruits, no thyromegaly. Lungs: Diminished, scattered rhonchi,, nonlabored breathing at rest. Cardiac: Indistinct PMI , regular rate and rhythm, no S3, no pericardial rub. Abdomen: Soft, nontender, protuberant, bowel sounds  present, no guarding or rebound. Extremities: Somewhat chronic appearing edema, distal pulses 2+. Skin: Warm and dry. Musculoskeletal: No kyphosis. Neuropsychiatric: Alert and oriented x3, affect grossly appropriate.   Lab Results Basic Metabolic Panel:  Recent Labs Lab 01/06/13 1847 01/07/13 0415 01/07/13 1114 01/08/13 0500  NA 133* 135  --  136  K 5.3* 5.0  --  4.4  CL 98 98  --  100  CO2 19 23  --  25  GLUCOSE 143* 144*  --  127*  BUN 59* 61*  --  63*  CREATININE 4.63* 4.91*  --  4.69*  CALCIUM 9.1 9.1  --  8.4  MG  --   --  2.2  --     CBC:  Recent Labs Lab 01/06/13 1847 01/07/13 0415 01/08/13 0500  WBC 18.1* 17.8* 13.1*  HGB 10.1* 9.3* 8.7*  HCT 30.8* 28.7* 26.0*  MCV 74.4* 74.0* 72.2*  PLT 293 277 229    Cardiac Enzymes:  Recent Labs Lab 01/07/13 0001 01/07/13 1114 01/07/13 1634  TROPONINI <0.30 <0.30 <0.30    Impression  1. Hypertensive emergency, blood pressure trending down on multimodal therapy. Does appear to be some component of noncompliance, although patient states that he was trying to take his medicines regularly until the last week, limited by nausea and emesis. No chest pain symptoms. He has had basic workup for secondary causes of hypertension, no definite RAS, normal aldosterone/PRA ratio recently, elevated urinary metanephrines and normetanephrines however MRI reportedly without adrenal mass to suggest pheochromocytoma.  2. Recent documentation of cardiomyopathy. Will review echocardiographic images. There is description of moderate to severe LVH with somewhat bright myocardium, also question of mild non-compaction changes at the apex. LVEF 30% with diffuse hypokinesis. Most likely this is a nonischemic cardiomyopathy, main question is whether it is secondary to severe hypertension, or perhaps due to some other underlying systemic illness. Amyloidosis might be a consideration.  3. Chronic kidney. Disease - stage IV, followed by nephrology.  Uncertain if he has had a renal biopsy at this  point. Has history of proteinuria, 2150 milligrams in 24 hours.  4. Untreated sleep apnea.   Recommendations  Work to attain better blood pressure control. Would try and transition off IV nitrates as this will not provide good blood pressure control over time due to tachyphylaxis. He is on reasonable doses of Coreg, Norvasc, Lisinopril, and Catapres, agree with addition of hydralazine as this had been effective for him previously. Would also try and diurese realizing renal failure may limit this to some degree. I think it may be a good idea to get input from the Advanced Heart Failure team as it relates to this patient's cardiomyopathy. It could simply be secondary to poorly controlled hypertension, although would be important to consider other possible secondary disease processes that may also be affecting his renal function, amyloidosis would be a consideration. Suggest SPEP and UPEP if not already obtained, also serum free light chain assay. He would probably not be able to have a cardiac MRI with gadolinium due to his renal failure. Whether a fat pad biopsy or even a renal biopsy would be considered is also up for discussion. Will review echocardiogram.   Satira Sark, M.D., F.A.C.C.

## 2013-01-08 NOTE — Progress Notes (Signed)
Family Medicine Teaching Service Daily Progress Note Intern Pager: [phone]  Patient name: Joshua [name] Medical record number: [id number] Date of birth: December 12, 1980 Age: 32 y.o. Gender: male  Primary Care Provider: Provider Not In System Consultants: none  Code Status: Full   Pt Overview and Major Events to Date:  8/15 - 8/16 - Nitro drip initiated, continues (MAP 190's on admission --> 130's-140's overnight)  Assessment and Plan: Clent Lafon is a 32 y.o. year old male presenting with headache and BP of 254/174. PMH is significant for prior episodes of hypertensive emergency, DM type II, anemia of chronic disease, and CKD stage IV. Concern for poor compliance with medications at home.  #Cardiac / hypertensive emergency / ?new onset heart failure - BP remains elevated though improved 170's/120's - repeat EKG's without much change; prolonged QTc remains ~520 --> avoid prolonging meds - no previous diagnosis of heart failure; echo shows EF [id number], LVH, diastolic dysfunction, with concern for amyloidosis - findings consistent with combined systolic and diastolic failure, though exam not suggestive of fluid overload - will consult cardiology given continued elevated BP and  - continue nitro drip  - restarted home meds: Norvasc 10, Coreg 25 BID , lisinopril 40, clonidine 0.3 TID, Imdur 120 - will restart hydralazine this morning - repeat Lasix IV x1 today and f/u output, BP's, weights [ ]  consider Lasix scheduled, ?daily, ?BID [ ]  f/u cardiology recs  #DM2 - per history, not on medications per med rec on admission - A1C 5.5 on 12/07/12 - will monitor on BMP, carb modified diet    #CKD IV -elevated Cr on admission, 4.6 (~3.5 baseline); believe due to severe uncontrolled HTN +/- DM - trend: 4.6-->4.91 --> 4.69 - admission K 5.3-->5.0 --> 4.4; no signs of hyperkalemia on EKG.  - allow PO fluids and trend creatinine with daily BMP; consider kayexalate as needed - KVO otherwise, though need to  monitor fluid I&O given new diagnosis of likely CHF, as above [ ]  consider nephrology consult if worsening or not improving (followed by Dr. Moshe Cipro)  #Anemia of chronic disease - believe secondary to CKD, baseline Hb ~10 - Hb trend: 10.1 --> 9.3 --> 8.7 - recent iron study with low iron but no increased ferritin or TIBC, so question deficiency - anemia is microcytic; continue iron sulfate for now  #Unspecified leukocytosis - uncertain cause, WBC 99991111 on admission; ?stress reaction -afebrile, CXR without obvious infiltrate, UA not suggestive of infection -WBC down to 13.1 on 8/16 -monitor clinically, trend with CBC's as needed  FEN/GI: Carb modified heart healthy diet, saline lock IV (considerable fluid load from nitro drip) Prophylaxis: Lovenox  Disposition: Stepdown status while on nitro drip at least, with management as above  -discharge planning pending further BP control, cards recs, and medication adjustments and/or further work-up  Subjective: No acute events overnight, though still complaining of headache. Pt complains of no BM in 3-4 days, though no bad abdominal pain. No shortness of breath or leg swelling. No chest pain on my exam, but documented central "burning" pain afterward (EKG obtained, unchanged; troponins negative overnight). Otherwise, "feels okay."  Objective: Temp:  [97.4 F (36.3 C)-98.1 F (36.7 C)] 97.4 F (36.3 C) (08/16 0742) Pulse Rate:  [70-102] 72 (08/16 0400) Resp:  [0-33] 11 (08/16 1000) BP: (126-198)/(77-130) 173/121 mmHg (08/16 1000) SpO2:  [95 %-100 %] 97 % (08/16 0742) Physical Exam: Gen: adult male in NAD, awake/alert/oriented  HEENT: [place]/AT, MMM  CV: RRR, normal S1/S2, no murmur appreciated Resp: CTABL, no wheezes,  non-labored; breath sounds slightly diminished ?due to body habitus Abd: soft, nontender; obese but not frankly distended, BS faint but present Ext: No edema, warm, well-perfused, distal pulses intact; LE with thickened, woody  skin, ?2/2 chronic venous stasis  Laboratory:  Recent Labs Lab 01/06/13 1847 01/07/13 0415 01/08/13 0500  WBC 18.1* 17.8* 13.1*  HGB 10.1* 9.3* 8.7*  HCT 30.8* 28.7* 26.0*  PLT 293 277 229    Recent Labs Lab 01/06/13 1847 01/07/13 0415 01/08/13 0500  NA 133* 135 136  K 5.3* 5.0 4.4  CL 98 98 100  CO2 19 23 25   BUN 59* 61* 63*  CREATININE 4.63* 4.91* 4.69*  CALCIUM 9.1 9.1 8.4  GLUCOSE 143* 144* 127*      Recent Labs Lab 01/07/13 0001 01/07/13 1114 01/07/13 1634  TROPONINI <0.30 <0.30 <0.30   Imaging/Diagnostic Tests: ED EKG reviewed showing sinus tach and non-specific T wave abnormalities, likely LVH Repeat EKG's: little change though no longer tachy;  abnormal R-wave progression/prominent precordial QRS's (likely LVH)   scattered non-specific T-wave changes, QTc remains ~520's  CXR 01/06/2013  IMPRESSION:  Cardiomegaly with interstitial edema. No focal acute finding.  2D Echo 8/15 @1822 : -  [place], MD 01/08/2013, 11:30 AM PGY-2, [place] Intern pager: [phone], text pages welcome

## 2013-01-08 NOTE — Progress Notes (Signed)
I have seen and examined this patient. I have discussed with Dr Venetia Maxon.  I agree with their findings and plans as documented in their progress note.  Once patient's hydralazine restarted at home dose, change patient back to his home IMDUR and stop the nitroglycerin drip.   I will be interested in Cardiology's opinion about the question of an infiltrative cardiomyopathic process like amyloidosis which was suggested by the echocardiogram images of the patient's LV wall.

## 2013-01-09 DIAGNOSIS — N039 Chronic nephritic syndrome with unspecified morphologic changes: Secondary | ICD-10-CM | POA: Diagnosis not present

## 2013-01-09 DIAGNOSIS — N184 Chronic kidney disease, stage 4 (severe): Secondary | ICD-10-CM | POA: Diagnosis not present

## 2013-01-09 DIAGNOSIS — E119 Type 2 diabetes mellitus without complications: Secondary | ICD-10-CM | POA: Diagnosis not present

## 2013-01-09 DIAGNOSIS — I10 Essential (primary) hypertension: Secondary | ICD-10-CM | POA: Diagnosis not present

## 2013-01-09 LAB — BASIC METABOLIC PANEL
CO2: 24 mEq/L (ref 19–32)
Calcium: 8 mg/dL — ABNORMAL LOW (ref 8.4–10.5)
Chloride: 101 mEq/L (ref 96–112)
Glucose, Bld: 118 mg/dL — ABNORMAL HIGH (ref 70–99)
Sodium: 137 mEq/L (ref 135–145)

## 2013-01-09 LAB — CBC
Platelets: 241 10*3/uL (ref 150–400)
RDW: 19.6 % — ABNORMAL HIGH (ref 11.5–15.5)
WBC: 11.1 10*3/uL — ABNORMAL HIGH (ref 4.0–10.5)

## 2013-01-09 MED ORDER — NITROGLYCERIN IN D5W 200-5 MCG/ML-% IV SOLN
INTRAVENOUS | Status: AC
  Filled 2013-01-09: qty 250

## 2013-01-09 MED ORDER — ISOSORBIDE MONONITRATE ER 60 MG PO TB24
120.0000 mg | ORAL_TABLET | Freq: Every day | ORAL | Status: DC
  Administered 2013-01-09 – 2013-01-11 (×3): 120 mg via ORAL
  Filled 2013-01-09 (×3): qty 2

## 2013-01-09 MED ORDER — FUROSEMIDE 40 MG PO TABS
40.0000 mg | ORAL_TABLET | Freq: Once | ORAL | Status: AC
  Administered 2013-01-09: 40 mg via ORAL
  Filled 2013-01-09: qty 1

## 2013-01-09 NOTE — Progress Notes (Signed)
I discussed with  Dr Raeford Razor.  I agree with their plans documented in their progress note for today. Await cardiology's opinion about role of infiltrative process in patient's nonischemic cardiomyopathy. Continue loop diuretic today but change to oral route.  Change IV NTG to patient's home oral IMDUR. May move pt out of step-down to floor once off of IV NTG.

## 2013-01-09 NOTE — Progress Notes (Signed)
Family Medicine Teaching Service Daily Progress Note Intern Pager: [phone]  Patient name: Joshua [name] Medical record number: [id number] Date of birth: Apr 25, 1981 Age: 32 y.o. Gender: male  Primary Care Provider: Provider Not In System Consultants: Cardiology  Code Status: none  Pt Overview and Major Events to Date:  8/15 - 8/16 - Nitro drip initiated, continues (MAP 190's on admission --> 130's-140's)  Assessment and Plan: Joshua [name] is a 32 y.o. year old male presenting with headache and BP of 254/174. PMH is significant for prior episodes of hypertensive emergency, DM type II, anemia of chronic disease, and CKD stage IV. Concern for poor compliance with medications at home.   #Cardiac / hypertensive emergency / ?new onset heart failure - BP remains elevated though improved 140's/90's, but most recent 155/113  - repeat EKG's without much change; prolonged QTc remains ~520 --> avoid prolonging meds  - no previous diagnosis of heart failure; echo shows EF A999333, LVH, diastolic dysfunction, with concern for amyloidosis  - findings consistent with combined systolic and diastolic failure, though exam not suggestive of fluid overload  - continue nitro drip  - restarted home meds: Norvasc 10, Coreg 25 BID , lisinopril 40, clonidine 0.3 TID, Imdur 120, hydralazine 100 mg TID  - dec weight 270-->259 (8/17) [ ]  consider Lasix scheduled, ?daily, ?BID   [ ]  f/u SPEP, UPEP, serum light chain assay  [ ]  ? c/s Advanced Heart failure team  - Cannot have cardiac MRI with gadolinium due to renal failure.   #DM2 - per history, not on medications per med rec on admission  - A1C 5.5 on 12/07/12  - will monitor on BMP, carb modified diet   #CKD IV -elevated Cr on admission, 4.6 (~3.5 baseline); believe due to severe uncontrolled HTN +/- DM  - trend: 4.6-->4.91 --> 4.69-->4.41  - admission K 5.3-->5.0 --> 4.4-->4.0; no signs of hyperkalemia on EKG.  - allow PO fluids and trend creatinine with daily  BMP; consider kayexalate as needed  - KVO otherwise, though need to monitor fluid I&O given new diagnosis of likely CHF, as above  [ ]  consider nephrology consult if worsening or not improving (followed by Dr. Moshe Cipro)   #Anemia of chronic disease - believe secondary to CKD, baseline Hb ~10  - Hb trend: 10.1 --> 9.3 --> 8.7  - recent iron study with low iron but no increased ferritin or TIBC, so question deficiency  - anemia is microcytic; continue iron sulfate for now   #Unspecified leukocytosis - uncertain cause, WBC 99991111 on admission; ?stress reaction  -afebrile, CXR without obvious infiltrate, UA not suggestive of infection  -WBC down to 11.1 on 8/17 -monitor clinically, trend with CBC's as needed   FEN/GI: Carb modified heart healthy diet, saline lock IV (considerable fluid load from nitro drip)  Prophylaxis: Lovenox   Disposition: Stepdown status while on nitro drip at least, with management as above  -discharge planning pending further BP control, cards recs, and medication adjustments and/or further work-up   Subjective: Patient had no ON events. Still reports of frontal headache but much better today. He is not having any problems with urination.   Objective: Temp:  [97.1 F (36.2 C)-97.9 F (36.6 C)] 97.4 F (36.3 C) (08/17 0735) Pulse Rate:  [64-73] 67 (08/17 0735) Resp:  [0-33] 24 (08/17 0700) BP: (135-183)/(75-126) 155/113 mmHg (08/17 0700) SpO2:  [93 %-100 %] 93 % (08/17 0735) Weight:  [259 lb 14.8 oz (117.9 kg)] 259 lb 14.8 oz (117.9 kg) (08/17 0300)  Intake/Output Summary (Last 24 hours) at 01/09/13 0811 Last data filed at 01/09/13 0600  Gross per 24 hour  Intake 1484.1 ml  Output   2050 ml  Net -565.9 ml  UOP: 0.7  Filed Weights   01/07/13 0245 01/09/13 0300  Weight: 270 lb 11.6 oz (122.8 kg) 259 lb 14.8 oz (117.9 kg)   Physical Exam: Gen: adult male in NAD, awake/alert/oriented  HEENT: [place]/AT, MMM  CV: RRR, normal S1/S2, no murmur appreciated   Resp: CTABL, no wheezes, non-labored; breath sounds   Abd: soft, nontender; obese but not frankly distended, BS faint but present  Ext: No edema, warm, well-perfused, distal pulses intact; LE with thickened, woody skin, ?2/2 chronic venous stasis  Laboratory:  Recent Labs Lab 01/07/13 0415 01/08/13 0500 01/09/13 0445  WBC 17.8* 13.1* 11.1*  HGB 9.3* 8.7* 8.7*  HCT 28.7* 26.0* 27.0*  PLT 277 229 241    Recent Labs Lab 01/07/13 0415 01/08/13 0500 01/09/13 0445  NA 135 136 137  K 5.0 4.4 4.0  CL 98 100 101  CO2 23 25 24   BUN 61* 63* 61*  CREATININE 4.91* 4.69* 4.41*  CALCIUM 9.1 8.4 8.0*  GLUCOSE 144* 127* 118*     Imaging/Diagnostic Tests: ED EKG reviewed showing sinus tach and non-specific T wave abnormalities, likely LVH  Repeat EKG's: little change though no longer tachy;  abnormal R-wave progression/prominent precordial QRS's (likely LVH)  scattered non-specific T-wave changes, QTc remains ~520's   CXR 01/06/2013  IMPRESSION:  Cardiomegaly with interstitial edema. No focal acute finding.  2D Echo 8/15 @1822    Clearance Coots, MD 01/09/2013, 7:56 AM PGY-1, [place] Intern pager: [phone], text pages welcome

## 2013-01-10 DIAGNOSIS — I509 Heart failure, unspecified: Secondary | ICD-10-CM

## 2013-01-10 DIAGNOSIS — N184 Chronic kidney disease, stage 4 (severe): Secondary | ICD-10-CM

## 2013-01-10 DIAGNOSIS — E119 Type 2 diabetes mellitus without complications: Secondary | ICD-10-CM

## 2013-01-10 LAB — KAPPA/LAMBDA LIGHT CHAINS
Kappa free light chain: 11.3 mg/dL — ABNORMAL HIGH (ref 0.33–1.94)
Kappa, lambda light chain ratio: 2.13 — ABNORMAL HIGH (ref 0.26–1.65)

## 2013-01-10 MED ORDER — NIFEDIPINE ER 60 MG PO TB24
120.0000 mg | ORAL_TABLET | Freq: Every day | ORAL | Status: DC
  Administered 2013-01-10 – 2013-01-11 (×2): 120 mg via ORAL
  Filled 2013-01-10 (×2): qty 2

## 2013-01-10 MED ORDER — HYDRALAZINE HCL 20 MG/ML IJ SOLN
INTRAMUSCULAR | Status: AC
  Administered 2013-01-10: 10 mg via INTRAVENOUS
  Filled 2013-01-10: qty 1

## 2013-01-10 MED ORDER — HYDRALAZINE HCL 20 MG/ML IJ SOLN: 10.0000 mg | INTRAMUSCULAR | Status: DC | PRN

## 2013-01-10 NOTE — Progress Notes (Signed)
Family Medicine Teaching Service Daily Progress Note Intern Pager: [phone]  Patient name: Joshua [name] Medical record number: [id number] Date of birth: Jan 06, 1981 Age: 32 y.o. Gender: male  Primary Care Provider: Provider Not In System Consultants: [facility]ilogy, HF team  Code Status: Full   Pt Overview and Major Events to Date:  8/15 - 8/16 - Nitro drip initiated, continues (MAP 190's on admission --> 130's-140's) 8/18 - 181/110 received 10 mg hydralazine   Assessment and Plan: Joshua [name] is a 32 y.o. year old male presenting with headache and BP of 254/174. PMH is significant for prior episodes of hypertensive emergency, DM type II, anemia of chronic disease, and CKD stage IV. Concern for poor compliance with medications at home.   #Cardiac / hypertensive emergency / ?new onset heart failure - BP remains elevated though improved 140's/90's, but most recent 155/113  - repeat EKG's without much change; prolonged QTc remains ~520 --> avoid prolonging meds  - no previous diagnosis of heart failure; echo shows EF [id number], LVH, diastolic dysfunction, with concern for amyloidosis  - findings consistent with combined systolic and diastolic failure, though exam not suggestive of fluid overload  - restarted home meds: Norvasc 10, Coreg 25 BID , lisinopril 40, clonidine 0.3 TID, Imdur 120, hydralazine 100 mg TID  - dec weight 270-->259 (8/17)   [ ]  f/u SPEP, UPEP, serum light chain assay  [ ]  F/U  Advanced Heart failure team rec's  - Cannot have cardiac MRI with gadolinium due to renal failure.   #DM2 - per history, not on medications per med rec on admission  - A1C 5.5 on 12/07/12  - will monitor on BMP, carb modified diet   #CKD IV -elevated Cr on admission, 4.6 (~3.5 baseline); believe due to severe uncontrolled HTN +/- DM  - trend: 4.6-->4.91 --> 4.69-->4.41  - admission K 5.3-->5.0 --> 4.4-->4.0; no signs of hyperkalemia on EKG.  - allow PO fluids and trend creatinine with daily BMP; consider  kayexalate as needed  - KVO otherwise, though need to monitor fluid I&O given new diagnosis of likely CHF, as above  [ ]  consider nephrology consult if worsening or not improving (followed by Dr. Moshe Cipro)   #Anemia of chronic disease - believe secondary to CKD, baseline Hb ~10  - Hb trend: 10.1 --> 9.3 --> 8.7  - recent iron study with low iron but no increased ferritin or TIBC, so question deficiency  - anemia is microcytic; continue iron sulfate for now   #Unspecified leukocytosis - uncertain cause, WBC 99991111 on admission; ?stress reaction  -afebrile, CXR without obvious infiltrate, UA not suggestive of infection  -WBC down to 11.1 on 8/17  -monitor clinically, trend with CBC's as needed   FEN/GI: Carb modified heart healthy diet, saline lock IV  Prophylaxis: Lovenox  Disposition: Transfer out of the stepdown if BP are stabilized   Subjective:Pt had no overnight events. He has no complaints and is feeling better. He is eating comfortably and reports no headache.   Objective: Temp:  [97.4 F (36.3 C)-98 F (36.7 C)] 97.6 F (36.4 C) (08/18 0725) Pulse Rate:  [66-72] 69 (08/18 0400) Resp:  [12-22] 12 (08/18 1000) BP: (124-181)/(64-117) 159/117 mmHg (08/18 1000) SpO2:  [96 %-98 %] 98 % (08/18 0725) Weight:  [259 lb 0.7 oz (117.5 kg)] 259 lb 0.7 oz (117.5 kg) (08/18 0358)  Intake/Output Summary (Last 24 hours) at 01/10/13 1113 Last data filed at 01/10/13 0804  Gross per 24 hour  Intake  956.5 ml  Output  3100 ml  Net -2143.5 ml  UOP: 1.O   Filed Weights   01/07/13 0245 01/09/13 0300 01/10/13 0358  Weight: 270 lb 11.6 oz (122.8 kg) 259 lb 14.8 oz (117.9 kg) 259 lb 0.7 oz (117.5 kg)   Physical Exam: Gen: adult male in NAD, awake/alert/oriented  HEENT: [place]/AT, MMM  CV: RRR, normal S1/S2, no murmur appreciated  Resp: CTABL, no wheezes, non-labored; breath sounds  Abd: soft, nontender; obese but not frankly distended, BS faint but present  Ext: No edema, warm,  well-perfused, distal pulses intact; LE with thickened, woody skin, ?2/2 chronic venous stasis  Laboratory:  Recent Labs Lab 01/07/13 0415 01/08/13 0500 01/09/13 0445  WBC 17.8* 13.1* 11.1*  HGB 9.3* 8.7* 8.7*  HCT 28.7* 26.0* 27.0*  PLT 277 229 241    Recent Labs Lab 01/07/13 0415 01/08/13 0500 01/09/13 0445  NA 135 136 137  K 5.0 4.4 4.0  CL 98 100 101  CO2 23 25 24   BUN 61* 63* 61*  CREATININE 4.91* 4.69* 4.41*  CALCIUM 9.1 8.4 8.0*  GLUCOSE 144* 127* 118*     Imaging/Diagnostic Tests: ED EKG reviewed showing sinus tach and non-specific T wave abnormalities, likely LVH  Repeat EKG's: little change though no longer tachy;  abnormal R-wave progression/prominent precordial QRS's (likely LVH)  scattered non-specific T-wave changes, QTc remains ~520's   CXR 01/06/2013  IMPRESSION:  Cardiomegaly with interstitial edema. No focal acute finding.  2D Echo 8/15 @1822    Clearance Coots, MD 01/10/2013, 11:13 AM PGY-1, [place] Intern pager: [phone], text pages welcome

## 2013-01-10 NOTE — Progress Notes (Signed)
Patient ID: Joshua [name], male   DOB: [birth year], 32 y.o.   MRN: [id number]    SUBJECTIVE:. No dyspnea or chest pain.  BP continues to run high on aggressive antihypertensive regimen.   Marland Kitchen aspirin  325 mg Oral Daily  . carvedilol  25 mg Oral BID WC  . cloNIDine  0.3 mg Oral TID  . enoxaparin (LOVENOX) injection  40 mg Subcutaneous Q24H  . ferrous sulfate  325 mg Oral BID  . gabapentin  300 mg Oral TID  . hydrALAZINE  100 mg Oral TID  . isosorbide mononitrate  120 mg Oral Daily  . lisinopril  40 mg Oral Daily  . NIFEdipine  120 mg Oral Daily  . sodium chloride  3 mL Intravenous Q12H      Filed Vitals:   01/10/13 0316 01/10/13 0358 01/10/13 0400 01/10/13 0725  BP: 181/110  153/87   Pulse:   69   Temp:  97.4 F (36.3 C)  97.6 F (36.4 C)  TempSrc:  Oral  Oral  Resp:   16   Height:      Weight:  117.5 kg (259 lb 0.7 oz)    SpO2:   96% 98%    Intake/Output Summary (Last 24 hours) at 01/10/13 0808 Last data filed at 01/10/13 0804  Gross per 24 hour  Intake   1081 ml  Output   3600 ml  Net  -2519 ml    LABS: Basic Metabolic Panel:  Recent Labs  01/07/13 1114 01/08/13 0500 01/09/13 0445  NA  --  136 137  K  --  4.4 4.0  CL  --  100 101  CO2  --  25 24  GLUCOSE  --  127* 118*  BUN  --  63* 61*  CREATININE  --  4.69* 4.41*  CALCIUM  --  8.4 8.0*  MG 2.2  --   --    Liver Function Tests: No results found for this basename: AST, ALT, ALKPHOS, BILITOT, PROT, ALBUMIN,  in the last 72 hours No results found for this basename: LIPASE, AMYLASE,  in the last 72 hours CBC:  Recent Labs  01/08/13 0500 01/09/13 0445  WBC 13.1* 11.1*  HGB 8.7* 8.7*  HCT 26.0* 27.0*  MCV 72.2* 74.0*  PLT 229 241   Cardiac Enzymes:  Recent Labs  01/07/13 1114 01/07/13 1634  TROPONINI <0.30 <0.30   BNP: No components found with this basename: POCBNP,  D-Dimer: No results found for this basename: DDIMER,  in the last 72 hours Hemoglobin A1C: No results found for this  basename: HGBA1C,  in the last 72 hours Fasting Lipid Panel: No results found for this basename: CHOL, HDL, LDLCALC, TRIG, CHOLHDL, LDLDIRECT,  in the last 72 hours Thyroid Function Tests: No results found for this basename: TSH, T4TOTAL, FREET3, T3FREE, THYROIDAB,  in the last 72 hours Anemia Panel: No results found for this basename: VITAMINB12, FOLATE, FERRITIN, TIBC, IRON, RETICCTPCT,  in the last 72 hours  RADIOLOGY: Dg Chest 2 View  01/06/2013   *RADIOLOGY REPORT*  Clinical Data: Severe shortness of breath  CHEST - 2 VIEW  Comparison: 09/26/2012  Findings: Cardiomegaly is noted, slightly more prominent than previously, with central vascular congestion and Kerley B lines indicating interstitial edema.  No pleural effusion.  No acute osseous finding.  IMPRESSION: Cardiomegaly with interstitial edema.  No focal acute finding.   Original Report Authenticated By: Conchita Paris, M.D.    PHYSICAL EXAM General: NAD Neck: No JVD, no thyromegaly or  thyroid nodule.  Lungs: Clear to auscultation bilaterally with normal respiratory effort. CV: Nondisplaced PMI.  Heart regular S1/S2, +S4, no murmur.  No peripheral edema.  No carotid bruit.  Normal pedal pulses.  Abdomen: Soft, nontender, no hepatosplenomegaly, no distention.  Neurologic: Alert and oriented x 3.  Psych: Normal affect. Extremities: No clubbing or cyanosis.   TELEMETRY: Reviewed telemetry pt in NSR  ASSESSMENT AND PLAN: 32 yo with history of HTN and CKD stage IV presented with hypertensive emergency and acute systolic CHF.  1. Acute systolic CHF: EF [id number] on echo with RV dysfunction.  Suspect uncontrolled HTN is the most likely cause of his cardiomyopathy.  However, would agree with working up for light chain amyloidosis with serum/urine immunofixation (suspect somewhat unlikely given his age and the severe HTN which provides an alternative explanation).  Continue Coreg, hydral/nitrates, and lisinopril.  Not a good candidate for  spironolactone due to CKD IV.  He does not appear significantly volume overloaded at this point, but a baseline po diuretic may be helpful.  2. HTN: BP continues to run quite high.  He is on high doses of multiple antihypertensives.  As above, not candidate for spironolactone.  Would stop amlodipine and start Procardia XL 120 mg daily.   Loralie Champagne 01/10/2013 8:14 AM

## 2013-01-10 NOTE — Progress Notes (Signed)
FMTS Attending Admission Note: Rumeal Cullipher,MD I  have seen and examined this patient, reviewed their chart. I have discussed this patient with the resident. I agree with the resident's findings, assessment and care plan.  

## 2013-01-10 NOTE — Progress Notes (Signed)
IV team came to start an IV in [name] [facility] but was unsuccessful. The patient was moved to 3W and a new IV team nurse will meet the patient over their to restart and IV. Will continue to monitor. The nurse on 3W is aware of the situation.   Joshua [name], Joshua Kung RN

## 2013-01-11 DIAGNOSIS — N184 Chronic kidney disease, stage 4 (severe): Secondary | ICD-10-CM | POA: Diagnosis not present

## 2013-01-11 DIAGNOSIS — E119 Type 2 diabetes mellitus without complications: Secondary | ICD-10-CM | POA: Diagnosis not present

## 2013-01-11 DIAGNOSIS — I10 Essential (primary) hypertension: Secondary | ICD-10-CM | POA: Diagnosis not present

## 2013-01-11 DIAGNOSIS — D631 Anemia in chronic kidney disease: Secondary | ICD-10-CM | POA: Diagnosis not present

## 2013-01-11 LAB — BASIC METABOLIC PANEL
BUN: 52 mg/dL — ABNORMAL HIGH (ref 6–23)
CO2: 20 mEq/L (ref 19–32)
Calcium: 8.8 mg/dL (ref 8.4–10.5)
Chloride: 106 mEq/L (ref 96–112)
Creatinine, Ser: 3.56 mg/dL — ABNORMAL HIGH (ref 0.50–1.35)
Glucose, Bld: 104 mg/dL — ABNORMAL HIGH (ref 70–99)

## 2013-01-11 LAB — PROTEIN ELECTROPHORESIS, SERUM
Alpha-1-Globulin: 7 % — ABNORMAL HIGH (ref 2.9–4.9)
Beta 2: 5.3 % (ref 3.2–6.5)
Gamma Globulin: 18.8 % (ref 11.1–18.8)
M-Spike, %: NOT DETECTED g/dL

## 2013-01-11 LAB — UIFE/LIGHT CHAINS/TP QN, 24-HR UR
Albumin, U: DETECTED
Alpha 2, Urine: DETECTED — AB
Beta, Urine: DETECTED — AB

## 2013-01-11 LAB — CBC
HCT: 28.9 % — ABNORMAL LOW (ref 39.0–52.0)
Hemoglobin: 9.7 g/dL — ABNORMAL LOW (ref 13.0–17.0)
MCH: 24.2 pg — ABNORMAL LOW (ref 26.0–34.0)
MCV: 72.1 fL — ABNORMAL LOW (ref 78.0–100.0)
Platelets: 277 10*3/uL (ref 150–400)
RBC: 4.01 MIL/uL — ABNORMAL LOW (ref 4.22–5.81)

## 2013-01-11 MED ORDER — DOCUSATE SODIUM 100 MG PO CAPS
100.0000 mg | ORAL_CAPSULE | Freq: Two times a day (BID) | ORAL | Status: DC
  Administered 2013-01-11: 100 mg via ORAL
  Filled 2013-01-11 (×2): qty 1

## 2013-01-11 MED ORDER — FUROSEMIDE 40 MG PO TABS
40.0000 mg | ORAL_TABLET | Freq: Every day | ORAL | Status: DC
  Administered 2013-01-11: 40 mg via ORAL
  Filled 2013-01-11: qty 1

## 2013-01-11 MED ORDER — POLYETHYLENE GLYCOL 3350 17 G PO PACK
17.0000 g | PACK | Freq: Every day | ORAL | Status: DC
  Administered 2013-01-11: 17 g via ORAL
  Filled 2013-01-11: qty 1

## 2013-01-11 MED ORDER — FUROSEMIDE 20 MG PO TABS: 20.0000 mg | ORAL_TABLET | Freq: Every day | ORAL | Status: DC

## 2013-01-11 MED ORDER — FUROSEMIDE 40 MG PO TABS: 40.0000 mg | ORAL_TABLET | Freq: Every day | ORAL | Status: DC

## 2013-01-11 MED ORDER — NIFEDIPINE ER 60 MG PO TB24: 120.0000 mg | ORAL_TABLET | Freq: Every day | ORAL | Status: DC

## 2013-01-11 NOTE — Progress Notes (Signed)
Family Medicine Teaching Service Daily Progress Note Intern Pager: [phone]  Patient name: Joshua [name] Medical record number: [id number] Date of birth: 04/01/1981 Age: 32 y.o. Gender: male  Primary Care Provider: Provider Not In System Consultants: Cardiology,  Code Status: Full  Pt Overview and Major Events to Date:  8/15 - 8/16 - Nitro drip initiated, continues (MAP 190's on admission --> 130's-140's)  8/18 - 181/110 received 10 mg hydralazine  8/19 - Lasix PO daily started, monitor Cr   Assessment and Plan: Taras Utt is a 32 y.o. year old male presenting with headache and BP of 254/174. PMH is significant for prior episodes of hypertensive emergency, DM type II, anemia of chronic disease, and CKD stage IV. Concern for poor compliance with medications at home.   #Cardiac / hypertensive emergency / ?new onset heart failure - BP remains elevated though improved 140's/90's, but most recent 155/113  - repeat EKG's without much change; prolonged QTc remains ~520 --> avoid prolonging meds   - no previous diagnosis of heart failure; echo shows EF A999333, LVH, diastolic  dysfunction, with concern for amyloidosis   - findings consistent with combined systolic and diastolic failure, though exam not  suggestive of fluid overload  - restarted home meds: Coreg 25 BID , lisinopril 40, clonidine 0.3 TID, Imdur 120, hydralazine 100 mg TID -->Norvasc discontinued and Procardia 120 XL started (8/18) - dec weight 270-->259 (8/17)  - Heart Failure Team Rec:  Norvasc discontinued and Procardia 120 XL started (8/18), Not a good candidate for spironolactone due to CKD IV. He does not appear significantly volume overloaded at this point, but a baseline po diuretic may be helpful. Cannot have cardiac MRI with gadolinium due to renal failure [ ]  f/u SPEP, UPEP, serum light chain assay    #DM2 - per history, not on medications per med rec on admission  - A1C 5.5 on 12/07/12  - will monitor on BMP, carb modified  diet   #CKD IV -elevated Cr on admission, 4.6 (~3.5 baseline); believe due to severe uncontrolled HTN +/- DM  - trend: 4.6-->4.91 --> 4.69-->4.41  - admission K 5.3-->5.0 --> 4.4-->4.0; no signs of hyperkalemia on EKG.  - allow PO fluids and trend creatinine with daily BMP; consider kayexalate as needed  - KVO otherwise, though need to monitor fluid I&O given new diagnosis of likely CHF, as above  [ ]  consider nephrology consult if worsening or not improving (followed by Dr. Moshe Cipro)   #Anemia of chronic disease - believe secondary to CKD, baseline Hb ~10  - Hb trend: 10.1 --> 9.3 --> 8.7  - recent iron study with low iron but no increased ferritin or TIBC, so question deficiency  - anemia is microcytic; continue iron sulfate for now   #Unspecified leukocytosis - uncertain cause, WBC 99991111 on admission; ?stress reaction  -afebrile, CXR without obvious infiltrate, UA not suggestive of infection  -WBC down to 11.1 on 8/17  -monitor clinically, trend with CBC's as needed   FEN/GI: Carb modified heart healthy diet, saline lock IV  Prophylaxis: Lovenox  Disposition: home pending improvement   Subjective: Patient had no overnight events. He is feeling well but wanted a stool softener.   Objective: Temp:  [97.3 F (36.3 C)-98.3 F (36.8 C)] 98.3 F (36.8 C) (08/19 0525) Pulse Rate:  [60-72] 68 (08/19 0525) Resp:  [12-18] 18 (08/19 0525) BP: (122-159)/(77-117) 152/98 mmHg (08/19 0525) SpO2:  [98 %-100 %] 100 % (08/19 0525) Weight:  [269 lb 8 oz (122.244 kg)] 269  lb 8 oz (122.244 kg) (08/19 0525)  Intake/Output Summary (Last 24 hours) at 01/11/13 0959 Last data filed at 01/10/13 2338  Gross per 24 hour  Intake    480 ml  Output      0 ml  Net    480 ml   Filed Weights   01/09/13 0300 01/10/13 0358 01/11/13 0525  Weight: 259 lb 14.8 oz (117.9 kg) 259 lb 0.7 oz (117.5 kg) 269 lb 8 oz (122.244 kg)    Physical Exam: Gen: adult male in NAD, awake/alert/oriented  HEENT: [place]/AT,  MMM  CV: RRR, normal S1/S2, no murmur appreciated  Resp: CTABL, no wheezes, non-labored; breath sounds  Abd: soft, nontender; obese but not frankly distended, BS faint but present  Ext: No edema, warm, well-perfused, distal pulses intact; LE with thickened, woody skin, ?2/2 chronic venous stasis  Laboratory:  Recent Labs Lab 01/08/13 0500 01/09/13 0445 01/11/13 0620  WBC 13.1* 11.1* 10.8*  HGB 8.7* 8.7* 9.7*  HCT 26.0* 27.0* 28.9*  PLT 229 241 277    Recent Labs Lab 01/08/13 0500 01/09/13 0445 01/11/13 0620  NA 136 137 139  K 4.4 4.0 4.3  CL 100 101 106  CO2 25 24 20   BUN 63* 61* 52*  CREATININE 4.69* 4.41* 3.56*  CALCIUM 8.4 8.0* 8.8  GLUCOSE 127* 118* 104*    Imaging/Diagnostic Tests: ED EKG reviewed showing sinus tach and non-specific T wave abnormalities, likely LVH  Repeat EKG's: little change though no longer tachy;  abnormal R-wave progression/prominent precordial QRS's (likely LVH)  scattered non-specific T-wave changes, QTc remains ~520's   CXR 01/06/2013  IMPRESSION:  Cardiomegaly with interstitial edema. No focal acute finding.  2D Echo 8/15 @1822    Clearance Coots, MD 01/11/2013, 9:59 AM PGY-1, [place] Intern pager: [phone], text pages welcome

## 2013-01-11 NOTE — Discharge Summary (Signed)
[facility] Discharge Summary  Patient name: Joshua [name] Medical record number: [id number] Date of birth: 1980/08/04 Age: 31 y.o. Gender: male Date of Admission: 01/06/2013  Date of Discharge: 01/11/13 Admitting Physician: Minerva Ends, MD  Primary Care Provider: Provider Not In System Consultants: Cardiology   Indication for Hospitalization: High blood pressure, Headache   Discharge Diagnoses/Problem List:  Hypertensive emergency  New onset heart failure  DM2  CKD IV Anemic of chronic disease   Disposition: home   Discharge Condition: stable  Brief Hospital Course:  Joshua [name] is a 32 y.o. year old male presenting with headache and BP of 254/174. PMH is significant for prior episodes of hypertensive emergency, DM type II, anemia of chronic disease, and CKD stage IV. Concern for poor compliance with medications at home.   #Cardiac / hypertensive emergency - with BP of 254/174 and Headache, denies chest pain now but charted as having it on presentation to the ED. Also with confusion on orientation to time.  - Initially started on a nitro drip in the ED, cont drip 8/15>8/17, then home medications restarted  - repeat EKG's without much change; prolonged QTc remains ~520 --> avoid prolonging meds   - restarted home meds: Coreg 25 BID , lisinopril 40, clonidine 0.3 TID, Imdur 120, hydralazine 100 mg TID -->Norvasc discontinued and Procardia 120 XL started (8/18) and Lasix 20 mg Daily  - Heart Failure Team Rec: Norvasc discontinued and Procardia 120 XL started (8/18), Not a good candidate for spironolactone due to CKD IV. He does not appear significantly volume overloaded at this point, but a baseline po diuretic may be helpful. Cannot have cardiac MRI with gadolinium due to renal failure   #New onset heart failure:  - no previous diagnosis of heart failure; echo shows EF [id number], LVH, diastolic dysfunction, with concern for amyloidosis  - findings  consistent with combined systolic and diastolic failure, though exam not suggestive of fluid overload  - dec weight 270-->259 (8/17)   #DM2 - per history, not on medications per med rec on admission  - A1C 5.5 on 12/07/12    #CKD IV -elevated Cr on admission, 4.6 (~3.5 baseline); believe due to severe uncontrolled HTN +/- DM  - trend: 4.6-->4.91 --> 4.69-->4.41-->3.56 - admission K 5.3-->5.0 --> 4.4-->4.0; no signs of hyperkalemia on EKG.   - KVO otherwise, though need to monitor fluid I&O given new diagnosis of likely CHF, as above  - (followed by Dr. Moshe Cipro)   #Anemia of chronic disease - believe secondary to CKD, baseline Hb ~10  - Hb trend: 10.1 --> 9.3 --> 8.7-->9.7 - recent iron study with low iron but no increased ferritin or TIBC, so question deficiency  - anemia is microcytic; continue iron sulfate for now   #Unspecified leukocytosis - uncertain cause, WBC 99991111 on admission;   -afebrile, CXR without obvious infiltrate, UA not suggestive of infection  -WBC down to 10.8 on 8/19 -monitor clinically, trend with CBC's as needed   Issues for Follow Up:  1. HTN: Procardia 120 XL, Lasix 20 mg Daily, Coreg 25 BID , lisinopril 40, clonidine 0.3 TID, Imdur 120, hydralazine 100 mg TID--> patient does not have insurance so compliance could be an issue  2. New onset Heart failure: Cardiology had question for light chain Amyloidosis causing heart failure. Kappa/lamda lights chain and protein electrophoresis is abnormal. Never had signs of fluid overload of edema on physical exam.   Significant Procedures: none   Significant Labs and Imaging:  Recent Labs Lab 01/08/13 0500 01/09/13 0445 01/11/13 0620  WBC 13.1* 11.1* 10.8*  HGB 8.7* 8.7* 9.7*  HCT 26.0* 27.0* 28.9*  PLT 229 241 277    Recent Labs Lab 01/07/13 0415 01/07/13 1114 01/08/13 0500 01/09/13 0445 01/11/13 0620  NA 135  --  136 137 139  K 5.0  --  4.4 4.0 4.3  CL 98  --  100 101 106  CO2 23  --  25 24 20    GLUCOSE 144*  --  127* 118* 104*  BUN 61*  --  63* 61* 52*  CREATININE 4.91*  --  4.69* 4.41* 3.56*  CALCIUM 9.1  --  8.4 8.0* 8.8  MG  --  2.2  --   --   --    Kappa/Lamba light chains:  Kappa free light chain 0.33 - 1.94 mg/dL  11.30 (H)   Lamda free light chains 0.57 - 2.63 mg/dL  5.30 (H)   Kappa, lamda light chain ratio 0.26 - 1.65  2.13 (H)    Protein electrophoresis:  Total Protein ELP 6.0 - 8.3 g/dL  5.5 (L)    Albumin ELP 55.8 - 66.1 %  49.0 (L)    Alpha-1-Globulin 2.9 - 4.9 %  7.0 (H)    Alpha-2-Globulin 7.1 - 11.8 %  13.9 (H)    ED EKG reviewed showing sinus tach and non-specific T wave abnormalities, likely LVH  Repeat EKG's: little change though no longer tachy;  abnormal R-wave progression/prominent precordial QRS's (likely LVH)  scattered non-specific T-wave changes, QTc remains ~520's   CXR 01/06/2013  IMPRESSION: Cardiomegaly with interstitial edema. No focal acute finding.   2D Echo 8/15 @1822    Outstanding Results: none  Discharge Medications:    Medication List    STOP taking these medications       amLODipine 10 MG tablet  Commonly known as:  NORVASC      TAKE these medications       aspirin 325 MG tablet  Take 325 mg by mouth daily.     carvedilol 25 MG tablet  Commonly known as:  COREG  Take 1 tablet (25 mg total) by mouth 2 (two) times daily.     cloNIDine 0.3 MG tablet  Commonly known as:  CATAPRES  Take 1 tablet (0.3 mg total) by mouth 3 (three) times daily.     ferrous sulfate 325 (65 FE) MG tablet  Take 325 mg by mouth 2 (two) times daily.     furosemide 20 MG tablet  Commonly known as:  LASIX  Take 1 tablet (20 mg total) by mouth daily.     gabapentin 100 MG capsule  Commonly known as:  NEURONTIN  Take 3 capsules (300 mg total) by mouth 3 (three) times daily.     hydrALAZINE 100 MG tablet  Commonly known as:  APRESOLINE  Take 1 tablet (100 mg total) by mouth 3 (three) times daily.     isosorbide mononitrate 60  MG 24 hr tablet  Commonly known as:  IMDUR  Take 2 tablets (120 mg total) by mouth daily.     lisinopril 40 MG tablet  Commonly known as:  PRINIVIL,ZESTRIL  Take 1 tablet (40 mg total) by mouth 2 (two) times daily.     NIFEdipine 60 MG 24 hr tablet  Commonly known as:  PROCARDIA-XL/ADALAT CC  Take 2 tablets (120 mg total) by mouth daily.     ondansetron 4 MG tablet  Commonly known as:  ZOFRAN  Take 1  tablet (4 mg total) by mouth every 6 (six) hours as needed for nausea.     traMADol 50 MG tablet  Commonly known as:  ULTRAM  Take 1 tablet (50 mg total) by mouth every 8 (eight) hours as needed.        Discharge Instructions: Please refer to Patient Instructions section of EMR for full details.  Patient was counseled important signs and symptoms that should prompt return to medical care, changes in medications, dietary instructions, activity restrictions, and follow up appointments.   Follow-Up Appointments: Follow-up Information   Follow up On 01/21/2013. (2:00 PM)    Contact information:   [place], [place] [address] [phone]      Follow up with Glori Bickers, MD On 02/03/2013. (11:40)    Specialty:  Cardiology   Contact information:   [address] [place] [address] [phone]       Clearance Coots, MD 01/13/2013, 7:53 PM PGY-1, [place]

## 2013-01-11 NOTE — Progress Notes (Signed)
Patient ID: Joshua [name], male   DOB: [birth year], 32 y.o.   MRN: [id number]   SUBJECTIVE:. No dyspnea or chest pain.  BP starting to look better.   Marland Kitchen aspirin  325 mg Oral Daily  . carvedilol  25 mg Oral BID WC  . cloNIDine  0.3 mg Oral TID  . docusate sodium  100 mg Oral BID  . enoxaparin (LOVENOX) injection  40 mg Subcutaneous Q24H  . ferrous sulfate  325 mg Oral BID  . [START ON 01/12/2013] furosemide  40 mg Oral Daily  . gabapentin  300 mg Oral TID  . hydrALAZINE  100 mg Oral TID  . isosorbide mononitrate  120 mg Oral Daily  . lisinopril  40 mg Oral Daily  . NIFEdipine  120 mg Oral Daily  . polyethylene glycol  17 g Oral Daily  . sodium chloride  3 mL Intravenous Q12H      Filed Vitals:   01/10/13 2100 01/11/13 0000 01/11/13 0525 01/11/13 1331  BP: 122/77  152/98 134/84  Pulse: 60 66 68 73  Temp: 97.3 F (36.3 C)  98.3 F (36.8 C) 97.4 F (36.3 C)  TempSrc: Oral  Oral Axillary  Resp: 18  18 17   Height:      Weight:   122.244 kg (269 lb 8 oz)   SpO2: 100%  100% 99%    Intake/Output Summary (Last 24 hours) at 01/11/13 1347 Last data filed at 01/10/13 2338  Gross per 24 hour  Intake    480 ml  Output      0 ml  Net    480 ml    LABS: Basic Metabolic Panel:  Recent Labs  01/09/13 0445 01/11/13 0620  NA 137 139  K 4.0 4.3  CL 101 106  CO2 24 20  GLUCOSE 118* 104*  BUN 61* 52*  CREATININE 4.41* 3.56*  CALCIUM 8.0* 8.8   Liver Function Tests: No results found for this basename: AST, ALT, ALKPHOS, BILITOT, PROT, ALBUMIN,  in the last 72 hours No results found for this basename: LIPASE, AMYLASE,  in the last 72 hours CBC:  Recent Labs  01/09/13 0445 01/11/13 0620  WBC 11.1* 10.8*  HGB 8.7* 9.7*  HCT 27.0* 28.9*  MCV 74.0* 72.1*  PLT 241 277   Cardiac Enzymes: No results found for this basename: CKTOTAL, CKMB, CKMBINDEX, TROPONINI,  in the last 72 hours BNP: No components found with this basename: POCBNP,  D-Dimer: No results found for this  basename: DDIMER,  in the last 72 hours Hemoglobin A1C: No results found for this basename: HGBA1C,  in the last 72 hours Fasting Lipid Panel: No results found for this basename: CHOL, HDL, LDLCALC, TRIG, CHOLHDL, LDLDIRECT,  in the last 72 hours Thyroid Function Tests: No results found for this basename: TSH, T4TOTAL, FREET3, T3FREE, THYROIDAB,  in the last 72 hours Anemia Panel: No results found for this basename: VITAMINB12, FOLATE, FERRITIN, TIBC, IRON, RETICCTPCT,  in the last 72 hours  RADIOLOGY: Dg Chest 2 View  01/06/2013   *RADIOLOGY REPORT*  Clinical Data: Severe shortness of breath  CHEST - 2 VIEW  Comparison: 09/26/2012  Findings: Cardiomegaly is noted, slightly more prominent than previously, with central vascular congestion and Kerley B lines indicating interstitial edema.  No pleural effusion.  No acute osseous finding.  IMPRESSION: Cardiomegaly with interstitial edema.  No focal acute finding.   Original Report Authenticated By: Conchita Paris, M.D.    PHYSICAL EXAM General: NAD Neck: No JVD, no thyromegaly or  thyroid nodule.  Lungs: Clear to auscultation bilaterally with normal respiratory effort. CV: Nondisplaced PMI.  Heart regular S1/S2, +S4, no murmur.  No peripheral edema.  No carotid bruit.  Normal pedal pulses.  Abdomen: Soft, nontender, no hepatosplenomegaly, no distention.  Neurologic: Alert and oriented x 3.  Psych: Normal affect. Extremities: No clubbing or cyanosis.   TELEMETRY: Reviewed telemetry pt in NSR  ASSESSMENT AND PLAN: 32 yo with history of HTN and CKD stage IV presented with hypertensive emergency and acute systolic CHF.  1. Acute systolic CHF: EF A999333 on echo with RV dysfunction.  Suspect uncontrolled HTN is the most likely cause of his cardiomyopathy.  Negative urine and serum immunofixation for monoclonal light chains.  Cannot rule out transthyretin amyloidosis but think uncontrolled HTN is more likely explanation.  Continue Coreg,  hydral/nitrates, and lisinopril.  Not a good candidate for spironolactone due to CKD IV.  He does not appear significantly volume overloaded at this point, but a baseline po diuretic may be helpful => would use Lasix 40 mg po daily.  2. HTN: BP starting to come down.  As above, adding diuretic.  3. Disposition: Obtaining meds is going to be a problem, he has no insurance.    Loralie Champagne 01/11/2013 1:47 PM

## 2013-01-11 NOTE — Progress Notes (Signed)
FMTS Attending Admission Note: Kehinde Eniola,MD I  have seen and examined this patient, reviewed their chart. I have discussed this patient with the resident. I agree with the resident's findings, assessment and care plan.  

## 2013-01-13 NOTE — Discharge Summary (Signed)
FMTS Attending Admission Note: Kehinde Eniola,MD I  have seen and examined this patient, reviewed their chart. I have discussed this patient with the resident. I agree with the resident's findings, assessment and care plan.  

## 2013-01-21 ENCOUNTER — Inpatient Hospital Stay: Payer: Self-pay

## 2013-01-23 ENCOUNTER — Inpatient Hospital Stay (HOSPITAL_COMMUNITY)
Admission: EM | Admit: 2013-01-23 | Discharge: 2013-01-26 | DRG: 682 | Disposition: A | Discharge status: Home Health | Payer: Medicaid Other | Attending: Internal Medicine | Admitting: Internal Medicine

## 2013-01-23 DIAGNOSIS — G4733 Obstructive sleep apnea (adult) (pediatric): Secondary | ICD-10-CM | POA: Diagnosis present

## 2013-01-23 DIAGNOSIS — Z79899 Other long term (current) drug therapy: Secondary | ICD-10-CM

## 2013-01-23 DIAGNOSIS — K859 Acute pancreatitis without necrosis or infection, unspecified: Secondary | ICD-10-CM | POA: Diagnosis present

## 2013-01-23 DIAGNOSIS — N186 End stage renal disease: Secondary | ICD-10-CM | POA: Diagnosis present

## 2013-01-23 DIAGNOSIS — R7989 Other specified abnormal findings of blood chemistry: Secondary | ICD-10-CM | POA: Diagnosis present

## 2013-01-23 DIAGNOSIS — I5043 Acute on chronic combined systolic (congestive) and diastolic (congestive) heart failure: Secondary | ICD-10-CM | POA: Diagnosis present

## 2013-01-23 DIAGNOSIS — N184 Chronic kidney disease, stage 4 (severe): Secondary | ICD-10-CM

## 2013-01-23 DIAGNOSIS — R7401 Elevation of levels of liver transaminase levels: Secondary | ICD-10-CM | POA: Diagnosis present

## 2013-01-23 DIAGNOSIS — I429 Cardiomyopathy, unspecified: Secondary | ICD-10-CM | POA: Diagnosis present

## 2013-01-23 DIAGNOSIS — I160 Hypertensive urgency: Secondary | ICD-10-CM

## 2013-01-23 DIAGNOSIS — E119 Type 2 diabetes mellitus without complications: Secondary | ICD-10-CM | POA: Diagnosis present

## 2013-01-23 DIAGNOSIS — R519 Headache, unspecified: Secondary | ICD-10-CM | POA: Diagnosis present

## 2013-01-23 DIAGNOSIS — Z9119 Patient's noncompliance with other medical treatment and regimen: Secondary | ICD-10-CM

## 2013-01-23 DIAGNOSIS — D631 Anemia in chronic kidney disease: Secondary | ICD-10-CM | POA: Diagnosis present

## 2013-01-23 DIAGNOSIS — I161 Hypertensive emergency: Secondary | ICD-10-CM | POA: Diagnosis present

## 2013-01-23 DIAGNOSIS — Z91199 Patient's noncompliance with other medical treatment and regimen due to unspecified reason: Secondary | ICD-10-CM

## 2013-01-23 DIAGNOSIS — N185 Chronic kidney disease, stage 5: Secondary | ICD-10-CM | POA: Diagnosis present

## 2013-01-23 DIAGNOSIS — I4891 Unspecified atrial fibrillation: Secondary | ICD-10-CM | POA: Diagnosis present

## 2013-01-23 DIAGNOSIS — I509 Heart failure, unspecified: Secondary | ICD-10-CM | POA: Diagnosis present

## 2013-01-23 DIAGNOSIS — R339 Retention of urine, unspecified: Secondary | ICD-10-CM | POA: Diagnosis present

## 2013-01-23 DIAGNOSIS — I120 Hypertensive chronic kidney disease with stage 5 chronic kidney disease or end stage renal disease: Principal | ICD-10-CM | POA: Diagnosis present

## 2013-01-23 DIAGNOSIS — R079 Chest pain, unspecified: Secondary | ICD-10-CM | POA: Diagnosis present

## 2013-01-23 DIAGNOSIS — R51 Headache: Secondary | ICD-10-CM | POA: Diagnosis present

## 2013-01-23 NOTE — ED Notes (Signed)
Bed: [id number] Expected date:  Expected time:  Means of arrival:  Comments: EMS abd pain,

## 2013-01-23 NOTE — ED Notes (Signed)
Per EMS - pt reports generalized abd pain that began this a.m. - pt admitsto hx of htn and has not been able to keep down his BP meds - pt hypertensive en route BP 240/160 - pt w/ n/v denies diarrhea, last NBM x2 days ago.

## 2013-01-24 ENCOUNTER — Emergency Department (HOSPITAL_COMMUNITY): Payer: Medicaid Other

## 2013-01-24 ENCOUNTER — Encounter (HOSPITAL_COMMUNITY): Payer: Self-pay | Admitting: *Deleted

## 2013-01-24 DIAGNOSIS — R079 Chest pain, unspecified: Secondary | ICD-10-CM | POA: Diagnosis present

## 2013-01-24 DIAGNOSIS — I10 Essential (primary) hypertension: Secondary | ICD-10-CM

## 2013-01-24 DIAGNOSIS — I5043 Acute on chronic combined systolic (congestive) and diastolic (congestive) heart failure: Secondary | ICD-10-CM | POA: Diagnosis present

## 2013-01-24 DIAGNOSIS — D72829 Elevated white blood cell count, unspecified: Secondary | ICD-10-CM

## 2013-01-24 DIAGNOSIS — R7401 Elevation of levels of liver transaminase levels: Secondary | ICD-10-CM | POA: Diagnosis present

## 2013-01-24 DIAGNOSIS — N189 Chronic kidney disease, unspecified: Secondary | ICD-10-CM

## 2013-01-24 DIAGNOSIS — R51 Headache: Secondary | ICD-10-CM | POA: Diagnosis present

## 2013-01-24 DIAGNOSIS — I429 Cardiomyopathy, unspecified: Secondary | ICD-10-CM

## 2013-01-24 DIAGNOSIS — R519 Headache, unspecified: Secondary | ICD-10-CM | POA: Diagnosis present

## 2013-01-24 DIAGNOSIS — N039 Chronic nephritic syndrome with unspecified morphologic changes: Secondary | ICD-10-CM

## 2013-01-24 DIAGNOSIS — D631 Anemia in chronic kidney disease: Secondary | ICD-10-CM

## 2013-01-24 LAB — POCT I-STAT TROPONIN I: Troponin i, poc: 0.04 ng/mL (ref 0.00–0.08)

## 2013-01-24 LAB — COMPREHENSIVE METABOLIC PANEL
AST: 40 U/L — ABNORMAL HIGH (ref 0–37)
Albumin: 3.1 g/dL — ABNORMAL LOW (ref 3.5–5.2)
BUN: 57 mg/dL — ABNORMAL HIGH (ref 6–23)
Calcium: 9.1 mg/dL (ref 8.4–10.5)
Creatinine, Ser: 4.74 mg/dL — ABNORMAL HIGH (ref 0.50–1.35)
GFR calc non Af Amer: 15 mL/min — ABNORMAL LOW (ref >90)
Total Bilirubin: 1.8 mg/dL — ABNORMAL HIGH (ref 0.3–1.2)

## 2013-01-24 LAB — TROPONIN I
Troponin I: <0.3 ng/mL (ref <0.30)
Troponin I: <0.3 ng/mL (ref <0.30)
Troponin I: <0.3 ng/mL (ref <0.30)

## 2013-01-24 LAB — POCT I-STAT, CHEM 8
Chloride: 105 mEq/L (ref 96–112)
Creatinine, Ser: 5.1 mg/dL — ABNORMAL HIGH (ref 0.50–1.35)
Glucose, Bld: 136 mg/dL — ABNORMAL HIGH (ref 70–99)
HCT: 34 % — ABNORMAL LOW (ref 39.0–52.0)
Hemoglobin: 11.6 g/dL — ABNORMAL LOW (ref 13.0–17.0)
Potassium: 4.9 mEq/L (ref 3.5–5.1)
Sodium: 138 mEq/L (ref 135–145)

## 2013-01-24 LAB — URINALYSIS, ROUTINE W REFLEX MICROSCOPIC
Leukocytes, UA: NEGATIVE
Nitrite: NEGATIVE
Specific Gravity, Urine: 1.024 (ref 1.005–1.030)
Urobilinogen, UA: 1 mg/dL (ref 0.0–1.0)
pH: 5 (ref 5.0–8.0)

## 2013-01-24 LAB — CBC
HCT: 30.5 % — ABNORMAL LOW (ref 39.0–52.0)
MCH: 23.5 pg — ABNORMAL LOW (ref 26.0–34.0)
MCHC: 32.8 g/dL (ref 30.0–36.0)
MCV: 71.6 fL — ABNORMAL LOW (ref 78.0–100.0)
Platelets: 406 10*3/uL — ABNORMAL HIGH (ref 150–400)
RDW: 19.8 % — ABNORMAL HIGH (ref 11.5–15.5)

## 2013-01-24 LAB — TSH: TSH: 6.491 u[IU]/mL — ABNORMAL HIGH (ref 0.350–4.500)

## 2013-01-24 LAB — HEMOGLOBIN A1C: Mean Plasma Glucose: 114 mg/dL (ref <117)

## 2013-01-24 LAB — URINE MICROSCOPIC-ADD ON

## 2013-01-24 LAB — MRSA PCR SCREENING: MRSA by PCR: NEGATIVE

## 2013-01-24 MED ORDER — LABETALOL HCL 5 MG/ML IV SOLN
0.5000 mg/min | INTRAVENOUS | Status: DC
  Administered 2013-01-24: 3 mg/min via INTRAVENOUS
  Filled 2013-01-24: qty 200

## 2013-01-24 MED ORDER — FUROSEMIDE 10 MG/ML IJ SOLN
80.0000 mg | Freq: Once | INTRAMUSCULAR | Status: DC
Start: 2013-01-24 — End: 2013-01-24

## 2013-01-24 MED ORDER — ASPIRIN 325 MG PO TABS
325.0000 mg | ORAL_TABLET | Freq: Every day | ORAL | Status: DC
  Administered 2013-01-24 – 2013-01-26 (×3): 325 mg via ORAL
  Filled 2013-01-24 (×3): qty 1

## 2013-01-24 MED ORDER — SENNOSIDES-DOCUSATE SODIUM 8.6-50 MG PO TABS
1.0000 | ORAL_TABLET | Freq: Every evening | ORAL | Status: DC | PRN
  Filled 2013-01-24: qty 1

## 2013-01-24 MED ORDER — GABAPENTIN 300 MG PO CAPS
300.0000 mg | ORAL_CAPSULE | Freq: Three times a day (TID) | ORAL | Status: DC
  Administered 2013-01-24: 300 mg via ORAL
  Filled 2013-01-24 (×3): qty 1

## 2013-01-24 MED ORDER — AMLODIPINE BESYLATE 10 MG PO TABS
10.0000 mg | ORAL_TABLET | Freq: Every day | ORAL | Status: DC
  Administered 2013-01-24 – 2013-01-26 (×3): 10 mg via ORAL
  Filled 2013-01-24 (×3): qty 1

## 2013-01-24 MED ORDER — CARVEDILOL 25 MG PO TABS
25.0000 mg | ORAL_TABLET | Freq: Two times a day (BID) | ORAL | Status: DC
  Administered 2013-01-24: 25 mg via ORAL
  Filled 2013-01-24 (×3): qty 1

## 2013-01-24 MED ORDER — ISOSORBIDE MONONITRATE ER 60 MG PO TB24
120.0000 mg | ORAL_TABLET | Freq: Every day | ORAL | Status: DC
  Administered 2013-01-24: 120 mg via ORAL
  Filled 2013-01-24: qty 2

## 2013-01-24 MED ORDER — HYDRALAZINE HCL 20 MG/ML IJ SOLN: 20.0000 mg | INTRAMUSCULAR | Status: DC | PRN

## 2013-01-24 MED ORDER — ONDANSETRON HCL 4 MG PO TABS: 4.0000 mg | ORAL_TABLET | Freq: Four times a day (QID) | ORAL | Status: DC | PRN

## 2013-01-24 MED ORDER — ONDANSETRON HCL 4 MG/2ML IJ SOLN: 4.0000 mg | Freq: Four times a day (QID) | INTRAMUSCULAR | Status: DC | PRN

## 2013-01-24 MED ORDER — HEPARIN SODIUM (PORCINE) 5000 UNIT/ML IJ SOLN
5000.0000 [IU] | Freq: Three times a day (TID) | INTRAMUSCULAR | Status: DC
  Administered 2013-01-24 – 2013-01-26 (×7): 5000 [IU] via SUBCUTANEOUS
  Filled 2013-01-24 (×10): qty 1

## 2013-01-24 MED ORDER — IOHEXOL 300 MG/ML  SOLN
50.0000 mL | Freq: Once | INTRAMUSCULAR | Status: AC | PRN
  Administered 2013-01-24: 50 mL via ORAL

## 2013-01-24 MED ORDER — GABAPENTIN 300 MG PO CAPS
300.0000 mg | ORAL_CAPSULE | Freq: Every day | ORAL | Status: DC
  Administered 2013-01-25: 21:00:00 300 mg via ORAL
  Filled 2013-01-24 (×2): qty 1

## 2013-01-24 MED ORDER — OXYCODONE HCL 5 MG PO TABS
5.0000 mg | ORAL_TABLET | ORAL | Status: DC | PRN
  Administered 2013-01-25: 5 mg via ORAL
  Filled 2013-01-24: qty 1

## 2013-01-24 MED ORDER — INSULIN ASPART 100 UNIT/ML ~~LOC~~ SOLN
0.0000 [IU] | SUBCUTANEOUS | Status: DC
  Administered 2013-01-24 – 2013-01-25 (×5): 1 [IU] via SUBCUTANEOUS

## 2013-01-24 MED ORDER — SODIUM CHLORIDE 0.9 % IJ SOLN
3.0000 mL | Freq: Two times a day (BID) | INTRAMUSCULAR | Status: DC
  Administered 2013-01-24 – 2013-01-26 (×4): 3 mL via INTRAVENOUS

## 2013-01-24 MED ORDER — LABETALOL HCL 300 MG PO TABS
300.0000 mg | ORAL_TABLET | Freq: Three times a day (TID) | ORAL | Status: DC
  Administered 2013-01-24 – 2013-01-26 (×7): 300 mg via ORAL
  Filled 2013-01-24 (×12): qty 1

## 2013-01-24 MED ORDER — MORPHINE SULFATE 4 MG/ML IJ SOLN
4.0000 mg | Freq: Once | INTRAMUSCULAR | Status: AC
  Administered 2013-01-24: 4 mg via INTRAVENOUS
  Filled 2013-01-24: qty 1

## 2013-01-24 MED ORDER — NIFEDIPINE ER 60 MG PO TB24
120.0000 mg | ORAL_TABLET | Freq: Every day | ORAL | Status: DC
  Administered 2013-01-24: 120 mg via ORAL
  Filled 2013-01-24: qty 2

## 2013-01-24 MED ORDER — PANTOPRAZOLE SODIUM 40 MG IV SOLR
40.0000 mg | INTRAVENOUS | Status: DC
  Administered 2013-01-24 – 2013-01-26 (×3): 40 mg via INTRAVENOUS
  Filled 2013-01-24 (×4): qty 40

## 2013-01-24 MED ORDER — CLONIDINE HCL 0.1 MG PO TABS
0.1000 mg | ORAL_TABLET | Freq: Three times a day (TID) | ORAL | Status: DC
  Administered 2013-01-24 – 2013-01-25 (×2): 0.1 mg via ORAL
  Filled 2013-01-24 (×4): qty 1

## 2013-01-24 MED ORDER — CLONIDINE HCL 0.3 MG PO TABS
0.3000 mg | ORAL_TABLET | Freq: Three times a day (TID) | ORAL | Status: DC
  Administered 2013-01-24 (×2): 0.3 mg via ORAL
  Filled 2013-01-24 (×3): qty 1

## 2013-01-24 MED ORDER — HYDRALAZINE HCL 20 MG/ML IJ SOLN
INTRAMUSCULAR | Status: AC
  Administered 2013-01-24: 20 mg
  Filled 2013-01-24: qty 1

## 2013-01-24 MED ORDER — ONDANSETRON HCL 4 MG/2ML IJ SOLN
4.0000 mg | Freq: Once | INTRAMUSCULAR | Status: AC
  Administered 2013-01-24: 4 mg via INTRAVENOUS
  Filled 2013-01-24: qty 2

## 2013-01-24 MED ORDER — LABETALOL HCL 5 MG/ML IV SOLN
0.5000 mg/min | INTRAVENOUS | Status: DC
  Administered 2013-01-24: 2.5 mg/min via INTRAVENOUS
  Administered 2013-01-24: 3 mg/min via INTRAVENOUS
  Administered 2013-01-24: 0.5 mg/min via INTRAVENOUS
  Filled 2013-01-24 (×3): qty 100

## 2013-01-24 MED ORDER — FUROSEMIDE 80 MG PO TABS
80.0000 mg | ORAL_TABLET | Freq: Two times a day (BID) | ORAL | Status: DC
  Administered 2013-01-24 – 2013-01-26 (×5): 80 mg via ORAL
  Filled 2013-01-24 (×7): qty 1

## 2013-01-24 NOTE — Progress Notes (Signed)
Pt rectal temp 95.1. Warming blanket applied. Pt very lethargic but arousable and oriented. Pt unable/unwilling to attempt urinating. Dr. Rockne Menghini notified via page.

## 2013-01-24 NOTE — Progress Notes (Addendum)
[facility] PROGRESS NOTE  Joshua [name] [id number] DOB: 03-May-1981 DOA: 01/23/2013 PCP: [facility]  Brief narrative: Joshua [name] is an 32 y.o. male with PMH of hypertension, type 2 diabetes, atrial fibrillation, OSA (non-compliant with CPAP) and stage IV chronic kidney disease who was admitted on 01/23/2013 with hypertensive urgency requiring ICU care.  Assessment/Plan: Principal Problem:   Hypertensive emergency -Admitted to SDU for closer monitoring. -Current treatment: Coreg, Clonidine, Hydralazine PRN, Imdur, Labetalol drip and Nifedipine. Goal is 25% reduction in MAP over 24 hours.  MAP 174 on admission, 161 on most recent check. -Blood pressure continues to be markedly elevated. -F/U UDS (has been negative in past). -He has had basic workup for secondary causes of hypertension, no definite RAS, normal aldosterone/PRA ratio recently, elevated urinary metanephrines and normetanephrines however MRI reportedly without adrenal mass to suggest pheochromocytoma. -Status post protein electrophoresis 01/08/2013, nonspecific pattern (seen by cardiologist on previous admission 01/08/2013 with recommendations to obtain SPEP/UPEP and serum free light chain assay).  ? Amyloidosis raised. -Will request nephrology consultation given ongoing hypertensive urgency and worsening renal function. Active Problems:   Pancreatitis -Patient has pancreatic inflammation on CT scan but no elevation of lipase. -Continue n.p.o. for now.   Diabetes -NPO at present, not on any medication. -Will start SSI every 4 hours with insulin sensitive coverage.   Chronic kidney disease (CKD), stage IV (severe) -Baseline creatinine 3.56. -Current creatinine elevated over usual baseline values.   Anemia in chronic kidney disease -Baseline hemoglobin 8.7-9.7. Current hemoglobin elevated over usual values.   Secondary cardiomyopathy / Acute on chronic combined systolic and diastolic CHF (congestive heart  failure) -Marked elevation in proBNP noted. -Cycling troponins Q 6 hours.  No results back yet.   Headache(784.0) -Secondary to hypertensive emergency.   Elevated LFTs -Likely from passive congestion of the liver secondary to CHF. -Status post abdominal ultrasound and CT of the abdomen. No evidence of acute cholecystitis.   Code Status: Full. Family Communication: No family at bedside. Disposition Plan: Home when stable.   Medical Consultants:  Nephrology  Other Consultants:  None.  Anti-infectives:  None.  HPI/Subjective: Joshua [name] is a bit lethargic but he does awaken to stimulation. Continues to report a mild headache and some chest tightness which has improved overnight. No nausea or vomiting.  Objective: Filed Vitals:   01/24/13 0540 01/24/13 0550 01/24/13 0619 01/24/13 0648  BP: 209/153 186/138  218/170  Pulse: 77 77 76 76  Temp:    97.3 F (36.3 C)  TempSrc:    Oral  Resp: 20 10 17  32  Height:      Weight:      SpO2: 97% 96% 100% 100%    Intake/Output Summary (Last 24 hours) at 01/24/13 0720 Last data filed at 01/24/13 0119  Gross per 24 hour  Intake      0 ml  Output    300 ml  Net   -300 ml    Exam: Gen: Lethargic Cardiovascular:  RRR, No M/R/G Respiratory:  Lung sounds are diminished with poor effort Gastrointestinal:  Abdomen soft, NT/ND, + BS Extremities:  Trace edema  Data Reviewed: Basic Metabolic Panel:  Recent Labs Lab 01/24/13 0045 01/24/13 0108  NA 136 138  K 4.2 4.9  CL 101 105  CO2 20  --   GLUCOSE 136* 136*  BUN 57* 75*  CREATININE 4.74* 5.10*  CALCIUM 9.1  --    GFR Estimated Creatinine Clearance: 26.9 ml/min (by C-G formula based on Cr of 5.1). Liver Function  Tests:  Recent Labs Lab 01/24/13 0045  AST 40*  ALT 61*  ALKPHOS 229*  BILITOT 1.8*  PROT 7.1  ALBUMIN 3.1*    Recent Labs Lab 01/24/13 0441  LIPASE 22   CBC:  Recent Labs Lab 01/24/13 0045 01/24/13 0108  WBC 14.0*  --   HGB 10.0*  11.6*  HCT 30.5* 34.0*  MCV 71.6*  --   PLT 406*  --    BNP (last 3 results)  Recent Labs  01/06/13 1847  PROBNP 61864.0*   CBG: No results found for this basename: GLUCAP,  in the last 168 hours   Procedures and Diagnostic Studies:  Ct Abdomen Pelvis Wo Contrast 01/24/2013   *RADIOLOGY REPORT*  Clinical Data: Right lower quadrant abdominal pain  CT ABDOMEN AND PELVIS WITHOUT CONTRAST  Technique:  Multidetector CT imaging of the abdomen and pelvis was performed following the standard protocol without intravenous contrast. Small amount of oral contrast was administered.  Comparison: None.  Findings: Patchy nodular opacities are present within the partially visualized lung bases, incompletely evaluated.  Cardiomegaly is present.  There is a small pericardial effusion.  Limited noncontrast evaluation the liver is unremarkable.  The gallbladder is normal.  The spleen, adrenal glands are normal.  The pancreas is somewhat ill-defined with prominent peripancreatic fat stranding, suggestive of acute pancreatitis. No loculated fluid collection to suggest pseudocyst formation.  No definite evidence of traumatic necrosis, although evaluation this is markedly limited on this noncontrast examination.  Pancreatic duct is not visualized.  There is no evidence of bowel obstruction. The appendix is not definitely visualized, however, no definite focal inflammatory changes are seen within the right lower quadrant to suggest acute appendicitis.  No abnormal wall thickening is seen about the bowels.  Bladder is unremarkable.  Prostate is within normal limits.  No free air is identified.  There is a small amount of free fluid within the pelvis  No pathologically enlarged intra-abdominal pelvic lymph nodes are identified.  No acute osseous abnormality identified.  Diffuse anasarca is present.  IMPRESSION:  1.  Ill definition of the pancreas with inflammatory fat stranding of the peripancreatic fat, suggestive of acute  pancreatitis. Correlation with serum amylase/lipase is recommended.  No loculated collection to suggest pseudocyst formation identified. 2.  Nonvisualization of the appendix, however, no focal inflammatory changes seen within the right lower quadrant to suggest acute appendicitis. 3. Patchy nodular opacities within the partially visualized lung bases.  Finding is of uncertain significance, but may represent a possible infectious process. 4. Cardiomegaly with trace pericardial effusion. 5.  Small amount of free pelvic fluid. 6.  Anasarca.   Original Report Authenticated By: Jeannine Boga, M.D.    Dg Chest 1 View 01/24/2013   *RADIOLOGY REPORT*  Clinical Data: Hypertension.  Abdominal pain.  CHEST - 1 VIEW  Comparison: 01/06/2013.  Findings: Chronic cardiopericardial enlargement.  Prominent, cephalized pulmonary venous markings.  No focal infiltrate, effusion, pneumothorax.  No pneumoperitoneum.  IMPRESSION:  Cardiomegaly and pulmonary venous hypertension.   Original Report Authenticated By: Jorje Guild    US Abdomen Complete 01/24/2013   *RADIOLOGY REPORT*  Clinical Data:  Right-sided abdominal pain.  COMPLETE ABDOMINAL ULTRASOUND  Comparison:  01/24/2013 CT.  Findings:  Gallbladder:  Gallbladder sludge with shadowing stone.  There is gallbladder wall thickening up to 5 mm, not seen on previous MRI. No sonographic Murphy's sign or marked marked distension.  Common bile duct:  Measures 4 mm.  Liver:  No focal lesion identified.  Within normal limits in  parenchymal echogenicity.  IVC:  Appears normal.  Pancreas:  Reference CT examination showing peripancreatic edema. No sonographic abnormality.  Spleen:  Borderline enlarged, 12 cm.  Right Kidney:  Measures 11 cm.  The cortex is diffusely echogenic. No hydronephrosis or focal abnormality.  Left Kidney:  Measures 10 cm.  The cortex is diffusely echogenic. No hydronephrosis or focal abnormality.  Abdominal aorta:  No aneurysm where seen.  Other:  Trace ascites  around the liver and gallbladder.  IMPRESSION:  1.  Cholelithiasis and nonspecific gallbladder wall thickening. Lack of sonographic Murphy's sign or marked gallbladder distension argue against acute cholecystitis. 2.  Findings consistent with medical renal disease. 3.  Trace ascites.   Original Report Authenticated By: Jorje Guild    Scheduled Meds: . aspirin  325 mg Oral Daily  . carvedilol  25 mg Oral Q12H  . cloNIDine  0.3 mg Oral TID  . gabapentin  300 mg Oral TID  . heparin  5,000 Units Subcutaneous Q8H  . isosorbide mononitrate  120 mg Oral Daily  . NIFEdipine  120 mg Oral Daily  . sodium chloride  3 mL Intravenous Q12H   Continuous Infusions: . labetalol (NORMODYNE) infusion      Time spent: 35 minutes. The patient is critically ill with hypertensive emergency and end organ damage. He requires a high level of medical decision-making and complex care.   LOS: 1 day   Ceira Hoeschen  [facility] Pager [phone].   *Please note that the hospitalists switch teams on Wednesdays. Please call the flow manager at [phone] if you are having difficulty reaching the hospitalist taking care of this patient as she can update you and provide the most up-to-date pager number of provider caring for the patient. If 8PM-8AM, please contact night-coverage at [email or web address], password [facility]  01/24/2013, 7:20 AM

## 2013-01-24 NOTE — Progress Notes (Signed)
New orders received from Dr. Rockne Menghini for I&O cath q 6 PRN.

## 2013-01-24 NOTE — Progress Notes (Signed)
No record of pt voiding since 0100. Bladder scanned app 750 ml. Pt voided 400; unable to void more.

## 2013-01-24 NOTE — Consult Note (Signed)
Referring Provider: No ref. provider found Primary Care Physician:  Provider Not In System Primary Nephrologist:  Dr. Moshe Cipro  Reason for Consultation: Hypertensive emergency HPI: Joshua [name] is an 32 y.o. male with PMH of hypertension, type 2 diabetes, atrial fibrillation, patient presented with the complaint of abdominal pain that started in the morning, the pain was located on the right side of the lower abdominal region.He mentions that he was not able to take his blood pressure medications due to nausea. He fills his prescriptions at [facility] on [address].     Past Medical History  Diagnosis Date  . Arthritis   . CKD (chronic kidney disease) stage 4, GFR 15-29 ml/min   . Hypertension     Hypertensive urgency/emergency - no RAS, normal aldo/PRA ratio,high urine metanephrines and normetanephrines   . Atrial fibrillation   . Type II diabetes mellitus   . History of bronchitis   . Sleep apnea     No CPAP  . Headache(784.0)     Regular  . Anemia of chronic disease     Past Surgical History  Procedure Laterality Date  . No past surgeries      Prior to Admission medications   Medication Sig Start Date End Date Taking? Authorizing Provider  aspirin 325 MG tablet Take 325 mg by mouth daily.   Yes Historical Provider, MD  carvedilol (COREG) 25 MG tablet Take 1 tablet (25 mg total) by mouth 2 (two) times daily. 09/02/12  Yes Posey Boyer, MD  cloNIDine (CATAPRES) 0.3 MG tablet Take 1 tablet (0.3 mg total) by mouth 3 (three) times daily. 09/28/12  Yes Coral Spikes, DO  ferrous sulfate 325 (65 FE) MG tablet Take 325 mg by mouth 2 (two) times daily.   Yes Historical Provider, MD  furosemide (LASIX) 20 MG tablet Take 1 tablet (20 mg total) by mouth daily. 01/11/13  Yes Clearance Coots, MD  gabapentin (NEURONTIN) 100 MG capsule Take 3 capsules (300 mg total) by mouth 3 (three) times daily. 09/02/12  Yes Posey Boyer, MD  hydrALAZINE (APRESOLINE) 100 MG tablet Take 1 tablet (100 mg  total) by mouth 3 (three) times daily. 09/28/12  Yes Coral Spikes, DO  isosorbide mononitrate (IMDUR) 60 MG 24 hr tablet Take 2 tablets (120 mg total) by mouth daily. 09/28/12  Yes Jayce G Cook, DO  lisinopril (PRINIVIL,ZESTRIL) 40 MG tablet Take 1 tablet (40 mg total) by mouth 2 (two) times daily. 09/28/12  Yes Coral Spikes, DO  NIFEdipine (PROCARDIA-XL/ADALAT CC) 60 MG 24 hr tablet Take 2 tablets (120 mg total) by mouth daily. 01/11/13  Yes Clearance Coots, MD  ondansetron (ZOFRAN) 4 MG tablet Take 1 tablet (4 mg total) by mouth every 6 (six) hours as needed for nausea. 09/28/12  Yes Coral Spikes, DO  traMADol (ULTRAM) 50 MG tablet Take 1 tablet (50 mg total) by mouth every 8 (eight) hours as needed. 09/29/12  Yes Coral Spikes, DO    Current Facility-Administered Medications  Medication Dose Route Frequency Provider Last Rate Last Dose  . amLODipine (NORVASC) tablet 10 mg  10 mg Oral Daily Sherril Croon, MD      . aspirin tablet 325 mg  325 mg Oral Daily Berle Mull, MD   325 mg at 01/24/13 0907  . cloNIDine (CATAPRES) tablet 0.3 mg  0.3 mg Oral TID Berle Mull, MD   0.3 mg at 01/24/13 0902  . furosemide (LASIX) tablet 80 mg  80 mg Oral BID Teresita Madura  Justin Mend, MD      . Derrill Memo ON 01/25/2013] gabapentin (NEURONTIN) capsule 300 mg  300 mg Oral QHS Sherril Croon, MD      . heparin injection 5,000 Units  5,000 Units Subcutaneous Q8H Berle Mull, MD   5,000 Units at 01/24/13 0902  . insulin aspart (novoLOG) injection 0-9 Units  0-9 Units Subcutaneous Q4H Venetia Maxon Rama, MD   1 Units at 01/24/13 0900  . labetalol (NORMODYNE) tablet 300 mg  300 mg Oral TID Sherril Croon, MD      . labetalol (NORMODYNE,TRANDATE) 4 mg/mL in dextrose 5 % 250 mL infusion  0.5-3 mg/min Intravenous Titrated Berle Mull, MD 45 mL/hr at 01/24/13 0800 3 mg/min at 01/24/13 0800  . ondansetron (ZOFRAN) tablet 4 mg  4 mg Oral Q6H PRN Berle Mull, MD       Or  . ondansetron (ZOFRAN) injection 4 mg  4 mg Intravenous Q6H PRN Berle Mull, MD       . oxyCODONE (Oxy IR/ROXICODONE) immediate release tablet 5 mg  5 mg Oral Q4H PRN Berle Mull, MD      . pantoprazole (PROTONIX) injection 40 mg  40 mg Intravenous Q24H Berle Mull, MD   40 mg at 01/24/13 0902  . sodium chloride 0.9 % injection 3 mL  3 mL Intravenous Q12H Berle Mull, MD   3 mL at 01/24/13 0910    Allergies as of 01/23/2013  . (No Known Allergies)    Family History  Problem Relation Age of Onset  . Diabetes Father   . Hypertension Father   . Hypertension Maternal Grandmother   . Hypertension Maternal Grandfather   . Kidney disease Maternal Grandfather   . Diabetes Maternal Grandfather     History   Social History  . Marital Status: Single    Spouse Name: N/A    Number of Children: N/A  . Years of Education: N/A   Occupational History  . Not on file.   Social History Main Topics  . Smoking status: Former Smoker -- .1 years    Types: Cigarettes    Quit date: 09/24/1999  . Smokeless tobacco: Never Used  . Alcohol Use: No  . Drug Use: No  . Sexual Activity: Yes   Other Topics Concern  . Not on file   Social History Narrative  . No narrative on file    Review of Systems: Gen: Denies any fever, chills, sweats, anorexia, fatigue, weakness, malaise, weight loss, and sleep disorder HEENT: No visual complaints, No history of Retinopathy. Normal external appearance No Epistaxis or Sore throat. No sinusitis.   CV: Denies chest pain, angina, palpitations, syncope, orthopnea, PND, peripheral edema, and claudication. Resp: Denies dyspnea at rest, dyspnea with exercise, cough, sputum, wheezing, coughing up blood, and pleurisy. GI: Denies vomiting blood, jaundice, and fecal incontinence.   Denies dysphagia or odynophagia. GU : Denies urinary burning, blood in urine, urinary frequency, urinary hesitancy, nocturnal urination, and urinary incontinence.  No renal calculi. MS: Denies joint pain, limitation of movement, and swelling, stiffness, low back pain,  extremity pain. Denies muscle weakness, cramps, atrophy.  No use of non steroidal antiinflammatory drugs. Derm: Denies rash, itching, dry skin, hives, moles, warts, or unhealing ulcers.  Psych: Denies depression, anxiety, memory loss, suicidal ideation, hallucinations, paranoia, and confusion. Heme: Denies bruising, bleeding, and enlarged lymph nodes. Neuro: No headache.  No diplopia. No dysarthria.  No dysphasia.  No history of CVA.  No Seizures. No paresthesias.  No weakness. Endocrine No DM.  No Thyroid disease.  No Adrenal disease.  Physical Exam: Vital signs in last 24 hours: Temp:  [97.3 F (36.3 C)-98.5 F (36.9 C)] 97.3 F (36.3 C) (09/01 0648) Pulse Rate:  [76-109] 76 (09/01 0648) Resp:  [8-32] 32 (09/01 0648) BP: (174-257)/(118-181) 218/170 mmHg (09/01 0648) SpO2:  [89 %-100 %] 100 % (09/01 0648) Weight:  [113.399 kg (250 lb)] 113.399 kg (250 lb) (08/31 2353)   General:   Alert,  Well-developed, well-nourished, pleasant and cooperative in NAD poor hygiene Head:  Normocephalic and atraumatic. Eyes:  Sclera clear, no icterus.   Conjunctiva pink. No photophobia Ears:  Normal auditory acuity. Nose:  No deformity, discharge,  or lesions. Mouth:  No deformity or lesions, dentition normal. Neck:  Supple; no masses or thyromegaly. JVP not elevated Lungs:  Clear throughout to auscultation.   No wheezes, crackles, or rhonchi. No acute distress. Heart:  Regular rate and rhythm; no murmurs, clicks, rubs,  or gallops. Abdomen:  Soft, nontender and nondistended. No masses, hepatosplenomegaly or hernias noted. Normal bowel sounds, without guarding, and without rebound.   Msk:  Symmetrical without gross deformities. Normal posture. Pulses:  No carotid, renal, femoral bruits. DP and PT symmetrical and equal Extremities:  Without clubbing or edema. Neurologic:  Alert and  oriented x4;  grossly normal neurologically. Skin:  Intact without significant lesions or rashes. Cervical Nodes:  No  significant cervical adenopathy. Psych:  Alert and cooperative. Normal mood and affect.  Intake/Output from previous day: 08/31 0701 - 09/01 0700 In: 173 [I.V.:173] Out: 300 [Urine:300] Intake/Output this shift:    Lab Results:  Recent Labs  01/24/13 0045 01/24/13 0108  WBC 14.0*  --   HGB 10.0* 11.6*  HCT 30.5* 34.0*  PLT 406*  --    BMET  Recent Labs  01/24/13 0045 01/24/13 0108  NA 136 138  K 4.2 4.9  CL 101 105  CO2 20  --   GLUCOSE 136* 136*  BUN 57* 75*  CREATININE 4.74* 5.10*  CALCIUM 9.1  --    LFT  Recent Labs  01/24/13 0045  PROT 7.1  ALBUMIN 3.1*  AST 40*  ALT 61*  ALKPHOS 229*  BILITOT 1.8*   PT/INR No results found for this basename: LABPROT, INR,  in the last 72 hours Hepatitis Panel No results found for this basename: HEPBSAG, HCVAB, HEPAIGM, HEPBIGM,  in the last 72 hours  Studies/Results: Ct Abdomen Pelvis Wo Contrast  01/24/2013   *RADIOLOGY REPORT*  Clinical Data: Right lower quadrant abdominal pain  CT ABDOMEN AND PELVIS WITHOUT CONTRAST  Technique:  Multidetector CT imaging of the abdomen and pelvis was performed following the standard protocol without intravenous contrast. Small amount of oral contrast was administered.  Comparison: None.  Findings: Patchy nodular opacities are present within the partially visualized lung bases, incompletely evaluated.  Cardiomegaly is present.  There is a small pericardial effusion.  Limited noncontrast evaluation the liver is unremarkable.  The gallbladder is normal.  The spleen, adrenal glands are normal.  The pancreas is somewhat ill-defined with prominent peripancreatic fat stranding, suggestive of acute pancreatitis. No loculated fluid collection to suggest pseudocyst formation.  No definite evidence of traumatic necrosis, although evaluation this is markedly limited on this noncontrast examination.  Pancreatic duct is not visualized.  There is no evidence of bowel obstruction. The appendix is not  definitely visualized, however, no definite focal inflammatory changes are seen within the right lower quadrant to suggest acute appendicitis.  No abnormal wall thickening is seen about  the bowels.  Bladder is unremarkable.  Prostate is within normal limits.  No free air is identified.  There is a small amount of free fluid within the pelvis  No pathologically enlarged intra-abdominal pelvic lymph nodes are identified.  No acute osseous abnormality identified.  Diffuse anasarca is present.  IMPRESSION:  1.  Ill definition of the pancreas with inflammatory fat stranding of the peripancreatic fat, suggestive of acute pancreatitis. Correlation with serum amylase/lipase is recommended.  No loculated collection to suggest pseudocyst formation identified. 2.  Nonvisualization of the appendix, however, no focal inflammatory changes seen within the right lower quadrant to suggest acute appendicitis. 3. Patchy nodular opacities within the partially visualized lung bases.  Finding is of uncertain significance, but may represent a possible infectious process. 4. Cardiomegaly with trace pericardial effusion. 5.  Small amount of free pelvic fluid. 6.  Anasarca.   Original Report Authenticated By: Jeannine Boga, M.D.   Dg Chest 1 View  01/24/2013   *RADIOLOGY REPORT*  Clinical Data: Hypertension.  Abdominal pain.  CHEST - 1 VIEW  Comparison: 01/06/2013.  Findings: Chronic cardiopericardial enlargement.  Prominent, cephalized pulmonary venous markings.  No focal infiltrate, effusion, pneumothorax.  No pneumoperitoneum.  IMPRESSION:  Cardiomegaly and pulmonary venous hypertension.   Original Report Authenticated By: Jorje Guild   US Abdomen Complete  01/24/2013   *RADIOLOGY REPORT*  Clinical Data:  Right-sided abdominal pain.  COMPLETE ABDOMINAL ULTRASOUND  Comparison:  01/24/2013 CT.  Findings:  Gallbladder:  Gallbladder sludge with shadowing stone.  There is gallbladder wall thickening up to 5 mm, not seen on  previous MRI. No sonographic Murphy's sign or marked marked distension.  Common bile duct:  Measures 4 mm.  Liver:  No focal lesion identified.  Within normal limits in parenchymal echogenicity.  IVC:  Appears normal.  Pancreas:  Reference CT examination showing peripancreatic edema. No sonographic abnormality.  Spleen:  Borderline enlarged, 12 cm.  Right Kidney:  Measures 11 cm.  The cortex is diffusely echogenic. No hydronephrosis or focal abnormality.  Left Kidney:  Measures 10 cm.  The cortex is diffusely echogenic. No hydronephrosis or focal abnormality.  Abdominal aorta:  No aneurysm where seen.  Other:  Trace ascites around the liver and gallbladder.  IMPRESSION:  1.  Cholelithiasis and nonspecific gallbladder wall thickening. Lack of sonographic Murphy's sign or marked gallbladder distension argue against acute cholecystitis. 2.  Findings consistent with medical renal disease. 3.  Trace ascites.   Original Report Authenticated By: Jorje Guild    Assessment/Plan:  Hypertensive emergency. This may be secondary to non adherence although patient does claim general compliance. He has no encephalopathy although his renal function is worsening. I shall simplify his regimen today and transition him to oral agents. The use of ACE or ARB may not be possible due to CKD 5.  CKD 5 i anticipate dialysis will be required at some point and will need access and dialysis education  Anemia stable  Bones Will check PTH     LOS: 1 Ryelynn Guedea W @TODAY @9 :47 AM

## 2013-01-24 NOTE — ED Notes (Signed)
EDP OPITZ MADE AWARE OF PTS V/S

## 2013-01-24 NOTE — H&P (Signed)
[facility] History and Physical  Patient: Joshua [name]  [id number]  DOB: 04/15/1981  DOA: 01/23/2013  Referring physician: Dr. Marnette Burgess PCP: Provider Not In System   Chief Complaint: Abdominal pain  HPI: Joshua [name] is a 32 y.o. male with Past medical history of chronic kidney disease stage IV, hypertension with history of recent admission for hypertensive urgency with heart failure, sleep apnea noncompliant with CPAP, headache chronic, diabetes mellitus.  the patient presented with the complaint of abdominal pain that started in the morning, the pain was located on the right side of the lower abdominal region. He also had associated nausea but no vomiting. He denies any diarrhea or constipation. Although he has last bowel movement 2 days ago.  He mentions that he was not able to take his blood pressure medications today but otherwise he is compliant with them. He denies any chest pain, palpitation, dizziness, lightheadedness, focal neurological deficit, fever, chills, leg swelling, weight gain. He did mention that his breathing is little heavier than usual.  Review of Systems: as mentioned in the history of present illness.  A Comprehensive review of the other systems is negative.  Past Medical History  Diagnosis Date  . Arthritis   . CKD (chronic kidney disease) stage 4, GFR 15-29 ml/min   . Hypertension     Hypertensive urgency/emergency - no RAS, normal aldo/PRA ratio,high urine metanephrines and normetanephrines   . Atrial fibrillation   . Type II diabetes mellitus   . History of bronchitis   . Sleep apnea     No CPAP  . Headache(784.0)     Regular  . Anemia of chronic disease    Past Surgical History  Procedure Laterality Date  . No past surgeries     Social History:  reports that he quit smoking about 13 years ago. His smoking use included Cigarettes. He smoked 0.00 packs per day for .1 years. He has never used smokeless tobacco. He reports that he does not  drink alcohol or use illicit drugs. Patient is coming from home. Independent for most of his  ADL.  No Known Allergies  Family History  Problem Relation Age of Onset  . Diabetes Father   . Hypertension Father   . Hypertension Maternal Grandmother   . Hypertension Maternal Grandfather   . Kidney disease Maternal Grandfather   . Diabetes Maternal Grandfather     Prior to Admission medications   Medication Sig Start Date End Date Taking? Authorizing Provider  aspirin 325 MG tablet Take 325 mg by mouth daily.   Yes Historical Provider, MD  carvedilol (COREG) 25 MG tablet Take 1 tablet (25 mg total) by mouth 2 (two) times daily. 09/02/12  Yes Posey Boyer, MD  cloNIDine (CATAPRES) 0.3 MG tablet Take 1 tablet (0.3 mg total) by mouth 3 (three) times daily. 09/28/12  Yes Coral Spikes, DO  ferrous sulfate 325 (65 FE) MG tablet Take 325 mg by mouth 2 (two) times daily.   Yes Historical Provider, MD  furosemide (LASIX) 20 MG tablet Take 1 tablet (20 mg total) by mouth daily. 01/11/13  Yes Clearance Coots, MD  gabapentin (NEURONTIN) 100 MG capsule Take 3 capsules (300 mg total) by mouth 3 (three) times daily. 09/02/12  Yes Posey Boyer, MD  hydrALAZINE (APRESOLINE) 100 MG tablet Take 1 tablet (100 mg total) by mouth 3 (three) times daily. 09/28/12  Yes Coral Spikes, DO  isosorbide mononitrate (IMDUR) 60 MG 24 hr tablet Take 2 tablets (120 mg total) by  mouth daily. 09/28/12  Yes Jayce G Cook, DO  lisinopril (PRINIVIL,ZESTRIL) 40 MG tablet Take 1 tablet (40 mg total) by mouth 2 (two) times daily. 09/28/12  Yes Coral Spikes, DO  NIFEdipine (PROCARDIA-XL/ADALAT CC) 60 MG 24 hr tablet Take 2 tablets (120 mg total) by mouth daily. 01/11/13  Yes Clearance Coots, MD  ondansetron (ZOFRAN) 4 MG tablet Take 1 tablet (4 mg total) by mouth every 6 (six) hours as needed for nausea. 09/28/12  Yes Coral Spikes, DO  traMADol (ULTRAM) 50 MG tablet Take 1 tablet (50 mg total) by mouth every 8 (eight) hours as needed. 09/29/12  Yes  Coral Spikes, DO    Physical Exam: Filed Vitals:   01/24/13 0540 01/24/13 0550 01/24/13 0619 01/24/13 0648  BP: 209/153 186/138  218/170  Pulse: 77 77 76   Temp:      TempSrc:      Resp: 20 10 17    Height:      Weight:      SpO2: 97% 96% 100%     General: Alert, Awake and Oriented to Time, Place and Person. Appear in marked distress Eyes: PERRL ENT: Oral Mucosa clear dry. Neck: No  JVD, no  Carotid Bruits  Cardiovascular: S1 and S2 Present, no Murmur, Peripheral Pulses Present Respiratory: Bilateral Air entry equal and Decreased, Clear to Auscultation,  No  Crackles,no  wheezes Abdomen: Bowel Sound Present, Soft and minimal tender on the right lower region.no guarding no rigidity  Skin: No  Rash Extremities: Bilateral  Pedal edema, no  calf tenderness Neurologic: Grossly Unremarkable.  Labs on Admission:  CBC:  Recent Labs Lab 01/24/13 0045 01/24/13 0108  WBC 14.0*  --   HGB 10.0* 11.6*  HCT 30.5* 34.0*  MCV 71.6*  --   PLT 406*  --     CMP     Component Value Date/Time   NA 138 01/24/2013 0108   NA 135* 07/29/2012   K 4.9 01/24/2013 0108   CL 105 01/24/2013 0108   CO2 20 01/24/2013 0045   GLUCOSE 136* 01/24/2013 0108   BUN 75* 01/24/2013 0108   BUN 3* 07/29/2012   CREATININE 5.10* 01/24/2013 0108   CREATININE 3.41* 09/02/2012 1734   CREATININE 4.0* 07/29/2012   CALCIUM 9.1 01/24/2013 0045   PROT 7.1 01/24/2013 0045   ALBUMIN 3.1* 01/24/2013 0045   AST 40* 01/24/2013 0045   ALT 61* 01/24/2013 0045   ALKPHOS 229* 01/24/2013 0045   BILITOT 1.8* 01/24/2013 0045   GFRNONAA 15* 01/24/2013 0045   GFRAA 17* 01/24/2013 0045     Recent Labs Lab 01/24/13 0441  LIPASE 22   No results found for this basename: AMMONIA,  in the last 168 hours  Cardiac Enzymes: No results found for this basename: CKTOTAL, CKMB, CKMBINDEX, TROPONINI,  in the last 168 hours  BNP (last 3 results)  Recent Labs  01/06/13 1847  PROBNP [id number]*    Radiological Exams on Admission: Ct Abdomen Pelvis Wo  Contrast  01/24/2013   *RADIOLOGY REPORT*  Clinical Data: Right lower quadrant abdominal pain  CT ABDOMEN AND PELVIS WITHOUT CONTRAST  Technique:  Multidetector CT imaging of the abdomen and pelvis was performed following the standard protocol without intravenous contrast. Small amount of oral contrast was administered.  Comparison: None.  Findings: Patchy nodular opacities are present within the partially visualized lung bases, incompletely evaluated.  Cardiomegaly is present.  There is a small pericardial effusion.  Limited noncontrast evaluation the liver is unremarkable.  The  gallbladder is normal.  The spleen, adrenal glands are normal.  The pancreas is somewhat ill-defined with prominent peripancreatic fat stranding, suggestive of acute pancreatitis. No loculated fluid collection to suggest pseudocyst formation.  No definite evidence of traumatic necrosis, although evaluation this is markedly limited on this noncontrast examination.  Pancreatic duct is not visualized.  There is no evidence of bowel obstruction. The appendix is not definitely visualized, however, no definite focal inflammatory changes are seen within the right lower quadrant to suggest acute appendicitis.  No abnormal wall thickening is seen about the bowels.  Bladder is unremarkable.  Prostate is within normal limits.  No free air is identified.  There is a small amount of free fluid within the pelvis  No pathologically enlarged intra-abdominal pelvic lymph nodes are identified.  No acute osseous abnormality identified.  Diffuse anasarca is present.  IMPRESSION:  1.  Ill definition of the pancreas with inflammatory fat stranding of the peripancreatic fat, suggestive of acute pancreatitis. Correlation with serum amylase/lipase is recommended.  No loculated collection to suggest pseudocyst formation identified. 2.  Nonvisualization of the appendix, however, no focal inflammatory changes seen within the right lower quadrant to suggest acute  appendicitis. 3. Patchy nodular opacities within the partially visualized lung bases.  Finding is of uncertain significance, but may represent a possible infectious process. 4. Cardiomegaly with trace pericardial effusion. 5.  Small amount of free pelvic fluid. 6.  Anasarca.   Original Report Authenticated By: Jeannine Boga, M.D.   Dg Chest 1 View  01/24/2013   *RADIOLOGY REPORT*  Clinical Data: Hypertension.  Abdominal pain.  CHEST - 1 VIEW  Comparison: 01/06/2013.  Findings: Chronic cardiopericardial enlargement.  Prominent, cephalized pulmonary venous markings.  No focal infiltrate, effusion, pneumothorax.  No pneumoperitoneum.  IMPRESSION:  Cardiomegaly and pulmonary venous hypertension.   Original Report Authenticated By: Jorje Guild   US Abdomen Complete  01/24/2013   *RADIOLOGY REPORT*  Clinical Data:  Right-sided abdominal pain.  COMPLETE ABDOMINAL ULTRASOUND  Comparison:  01/24/2013 CT.  Findings:  Gallbladder:  Gallbladder sludge with shadowing stone.  There is gallbladder wall thickening up to 5 mm, not seen on previous MRI. No sonographic Murphy's sign or marked marked distension.  Common bile duct:  Measures 4 mm.  Liver:  No focal lesion identified.  Within normal limits in parenchymal echogenicity.  IVC:  Appears normal.  Pancreas:  Reference CT examination showing peripancreatic edema. No sonographic abnormality.  Spleen:  Borderline enlarged, 12 cm.  Right Kidney:  Measures 11 cm.  The cortex is diffusely echogenic. No hydronephrosis or focal abnormality.  Left Kidney:  Measures 10 cm.  The cortex is diffusely echogenic. No hydronephrosis or focal abnormality.  Abdominal aorta:  No aneurysm where seen.  Other:  Trace ascites around the liver and gallbladder.  IMPRESSION:  1.  Cholelithiasis and nonspecific gallbladder wall thickening. Lack of sonographic Murphy's sign or marked gallbladder distension argue against acute cholecystitis. 2.  Findings consistent with medical renal disease.  3.  Trace ascites.   Original Report Authenticated By: Jorje Guild    EKG: Independently reviewed. unchanged from previous tracings. Sinus tachycardia with prolonged QTC.  Assessment/Plan Principal Problem:   Hypertensive emergency Active Problems:   Diabetes   Chronic kidney disease (CKD), stage IV (severe)   Anemia in chronic kidney disease   Secondary cardiomyopathy   Headache(784.0)   Chest pain   Elevated LFTs   1. Hypertensive emergency the patient presents with complain of abdominal pain.  His initial presentation was associated  with nausea , do to which he cannot take his blood pressure medications today. He initially presented with blood pressure of 250/160. and was started on labetalol drip. Currently his blood pressure is  Around [id number] systolic. I will add hydralazine 20 mg every 4 hours as needed and continue him on his home dose of clonidine, Procardia, Imdur. I would hold off home dose of hydralazine, and lisinopril to avoid significant drop in his blood pressure. At present he doesn't appear to have any neurological deficit or chest pain. He does have a complaint of shortness of breath. Continue neurochecks every 4 hours. Initial troponin is negative I would follow serial troponins. Patient may benefit from nephrology consult for further management of his blood pressure, which might possibly be volume overload and diuresis might not be effective for that.  2.Diabetes mellitus   Sliding scale, and acute the patient n.p.o. at present in view of possible pancreatitis the CT scan of the abdomen.  3.Possible pancreatitis  Patient's lipase is negative, patient does not have any epigastric or diffuse tenderness, there is minimal tenderness in the right inguinal region which patient mentions is getting better. CT scan is also suggestive of possible pancreatitis therefore I will keep the patient n.p.o.. Although considering his elevated blood pressure and chronic kidney  disease I would hold off on giving him IV fluids, also he has history of CHF which would preclude IV resuscitation.  DVT Prophylaxis: subcutaneous Heparin Nutrition: N.p.o.  Code Status: Full    Author: Berle Mull, MD [facility] Pager: [phone] 01/24/2013, 7:00 AM    If 7PM-7AM, please contact night-coverage [email or web address] Password [email or web address]

## 2013-01-24 NOTE — ED Notes (Signed)
Patient transported to CT 

## 2013-01-24 NOTE — ED Notes (Signed)
Patient transported to X-ray 

## 2013-01-24 NOTE — ED Provider Notes (Signed)
CSN: [id number]     Arrival date & time 01/23/13  2350 History   First MD Initiated Contact with Patient 01/24/13 0034     Chief Complaint  Patient presents with  . Hypertension  . Abdominal Pain   (Consider location/radiation/quality/duration/timing/severity/associated sxs/prior Treatment) HPI Hx per patient: Patient reports ABD pain mostly R sided with N/V all day long unable to take his medications, no diarrhea. No blood in emesis. No Cp or SOB. He has a history of atrial fibrillation. No blood thinners. He has a history of hypertension and is on Imdur, lisinopril, hydralazine, clonidine, Corag, Norvasc. He reports he did not take his medications today. Denies fevers and chills.Symptoms are mild to moderate in severity. He has a history of coronary disease. He has a history of chronic renal insufficiency. He is followed by DR [facility] but does not have a PCP. He was recently admitted for HTN emergency. He has all medications at home just cant hold anything down.   Past Medical History  Diagnosis Date  . Arthritis   . CKD (chronic kidney disease) stage 4, GFR 15-29 ml/min   . Hypertension     Hypertensive urgency/emergency - no RAS, normal aldo/PRA ratio,high urine metanephrines and normetanephrines   . Atrial fibrillation   . Type II diabetes mellitus   . History of bronchitis   . Sleep apnea     No CPAP  . Headache(784.0)     Regular  . Anemia of chronic disease    Past Surgical History  Procedure Laterality Date  . No past surgeries     Family History  Problem Relation Age of Onset  . Diabetes Father   . Hypertension Father   . Hypertension Maternal Grandmother   . Hypertension Maternal Grandfather   . Kidney disease Maternal Grandfather   . Diabetes Maternal Grandfather    History  Substance Use Topics  . Smoking status: Former Smoker -- .1 years    Types: Cigarettes    Quit date: 09/24/1999  . Smokeless tobacco: Never Used  . Alcohol Use: No     Review of Systems  Constitutional: Negative for fever and chills.  HENT: Negative for neck pain and neck stiffness.   Eyes: Negative for visual disturbance.  Respiratory: Negative for shortness of breath.   Cardiovascular: Negative for chest pain.  Gastrointestinal: Positive for vomiting and abdominal pain.  Genitourinary: Negative for flank pain.  Musculoskeletal: Negative for back pain.  Skin: Negative for rash.  Neurological: Negative for seizures and syncope.  All other systems reviewed and are negative.    Allergies  Review of patient's allergies indicates no known allergies.  Home Medications   Current Outpatient Rx  Name  Route  Sig  Dispense  Refill  . aspirin 325 MG tablet   Oral   Take 325 mg by mouth daily.         . carvedilol (COREG) 25 MG tablet   Oral   Take 1 tablet (25 mg total) by mouth 2 (two) times daily.   60 tablet   2   . cloNIDine (CATAPRES) 0.3 MG tablet   Oral   Take 1 tablet (0.3 mg total) by mouth 3 (three) times daily.   60 tablet   3   . ferrous sulfate 325 (65 FE) MG tablet   Oral   Take 325 mg by mouth 2 (two) times daily.         . furosemide (LASIX) 20 MG tablet   Oral   Take  1 tablet (20 mg total) by mouth daily.   30 tablet   0   . gabapentin (NEURONTIN) 100 MG capsule   Oral   Take 3 capsules (300 mg total) by mouth 3 (three) times daily.   90 capsule   3   . hydrALAZINE (APRESOLINE) 100 MG tablet   Oral   Take 1 tablet (100 mg total) by mouth 3 (three) times daily.   90 tablet   3   . isosorbide mononitrate (IMDUR) 60 MG 24 hr tablet   Oral   Take 2 tablets (120 mg total) by mouth daily.   30 tablet   3   . lisinopril (PRINIVIL,ZESTRIL) 40 MG tablet   Oral   Take 1 tablet (40 mg total) by mouth 2 (two) times daily.   30 tablet   3   . NIFEdipine (PROCARDIA-XL/ADALAT CC) 60 MG 24 hr tablet   Oral   Take 2 tablets (120 mg total) by mouth daily.   60 tablet   0   . ondansetron (ZOFRAN) 4 MG  tablet   Oral   Take 1 tablet (4 mg total) by mouth every 6 (six) hours as needed for nausea.   20 tablet   0   . traMADol (ULTRAM) 50 MG tablet   Oral   Take 1 tablet (50 mg total) by mouth every 8 (eight) hours as needed.   12 tablet   0    BP 253/165  Pulse 109  Temp(Src) 98.5 F (36.9 C) (Oral)  Resp 18  Ht 5\' 11"  (1.803 m)  Wt 250 lb (113.399 kg)  BMI 34.88 kg/m2  SpO2 99% Physical Exam  Constitutional: He is oriented to person, place, and time. He appears well-developed and well-nourished.  hypertensive  HENT:  Head: Normocephalic and atraumatic.  Eyes: EOM are normal. Pupils are equal, round, and reactive to light.  Neck: Neck supple.  Cardiovascular: Regular rhythm and intact distal pulses.   Mild tachycardia  Pulmonary/Chest: Effort normal and breath sounds normal. No stridor. No respiratory distress. He exhibits no tenderness.  Abdominal: Soft. Bowel sounds are normal. He exhibits no distension. There is no rebound and no guarding.  TTP RLQ  Musculoskeletal: Normal range of motion. He exhibits no edema and no tenderness.  Neurological: He is alert and oriented to person, place, and time. No cranial nerve deficit.  Skin: Skin is warm and dry.    ED Course  Procedures (including critical care time)  Results for orders placed during the hospital encounter of 01/23/13  CBC      Result Value Range   WBC 14.0 (*) 4.0 - 10.5 K/uL   RBC 4.26  4.22 - 5.81 MIL/uL   Hemoglobin 10.0 (*) 13.0 - 17.0 g/dL   HCT 30.5 (*) 39.0 - 52.0 %   MCV 71.6 (*) 78.0 - 100.0 fL   MCH 23.5 (*) 26.0 - 34.0 pg   MCHC 32.8  30.0 - 36.0 g/dL   RDW 19.8 (*) 11.5 - 15.5 %   Platelets 406 (*) 150 - 400 K/uL  COMPREHENSIVE METABOLIC PANEL      Result Value Range   Sodium 136  135 - 145 mEq/L   Potassium 4.2  3.5 - 5.1 mEq/L   Chloride 101  96 - 112 mEq/L   CO2 20  19 - 32 mEq/L   Glucose, Bld 136 (*) 70 - 99 mg/dL   BUN 57 (*) 6 - 23 mg/dL   Creatinine, Ser 4.74 (*) 0.50 -  1.35  mg/dL   Calcium 9.1  8.4 - 10.5 mg/dL   Total Protein 7.1  6.0 - 8.3 g/dL   Albumin 3.1 (*) 3.5 - 5.2 g/dL   AST 40 (*) 0 - 37 U/L   ALT 61 (*) 0 - 53 U/L   Alkaline Phosphatase 229 (*) 39 - 117 U/L   Total Bilirubin 1.8 (*) 0.3 - 1.2 mg/dL   GFR calc non Af Amer 15 (*) >90 mL/min   GFR calc Af Amer 17 (*) >90 mL/min  URINALYSIS, ROUTINE W REFLEX MICROSCOPIC      Result Value Range   Color, Urine YELLOW  YELLOW   APPearance CLEAR  CLEAR   Specific Gravity, Urine 1.024  1.005 - 1.030   pH 5.0  5.0 - 8.0   Glucose, UA NEGATIVE  NEGATIVE mg/dL   Hgb urine dipstick TRACE (*) NEGATIVE   Bilirubin Urine NEGATIVE  NEGATIVE   Ketones, ur NEGATIVE  NEGATIVE mg/dL   Protein, ur >300 (*) NEGATIVE mg/dL   Urobilinogen, UA 1.0  0.0 - 1.0 mg/dL   Nitrite NEGATIVE  NEGATIVE   Leukocytes, UA NEGATIVE  NEGATIVE  URINE MICROSCOPIC-ADD ON      Result Value Range   WBC, UA 0-2  <3 WBC/hpf   RBC / HPF 0-2  <3 RBC/hpf   Bacteria, UA RARE  RARE   Casts HYALINE CASTS (*) NEGATIVE  LIPASE, BLOOD      Result Value Range   Lipase 22  11 - 59 U/L  POCT I-STAT, CHEM 8      Result Value Range   Sodium 138  135 - 145 mEq/L   Potassium 4.9  3.5 - 5.1 mEq/L   Chloride 105  96 - 112 mEq/L   BUN 75 (*) 6 - 23 mg/dL   Creatinine, Ser 5.10 (*) 0.50 - 1.35 mg/dL   Glucose, Bld 136 (*) 70 - 99 mg/dL   Calcium, Ion 1.10 (*) 1.12 - 1.23 mmol/L   TCO2 21  0 - 100 mmol/L   Hemoglobin 11.6 (*) 13.0 - 17.0 g/dL   HCT 34.0 (*) 39.0 - 52.0 %  POCT I-STAT TROPONIN I      Result Value Range   Troponin i, poc 0.04  0.00 - 0.08 ng/mL   Comment 3            Ct Abdomen Pelvis Wo Contrast  01/24/2013   *RADIOLOGY REPORT*  Clinical Data: Right lower quadrant abdominal pain  CT ABDOMEN AND PELVIS WITHOUT CONTRAST  Technique:  Multidetector CT imaging of the abdomen and pelvis was performed following the standard protocol without intravenous contrast. Small amount of oral contrast was administered.  Comparison: None.   Findings: Patchy nodular opacities are present within the partially visualized lung bases, incompletely evaluated.  Cardiomegaly is present.  There is a small pericardial effusion.  Limited noncontrast evaluation the liver is unremarkable.  The gallbladder is normal.  The spleen, adrenal glands are normal.  The pancreas is somewhat ill-defined with prominent peripancreatic fat stranding, suggestive of acute pancreatitis. No loculated fluid collection to suggest pseudocyst formation.  No definite evidence of traumatic necrosis, although evaluation this is markedly limited on this noncontrast examination.  Pancreatic duct is not visualized.  There is no evidence of bowel obstruction. The appendix is not definitely visualized, however, no definite focal inflammatory changes are seen within the right lower quadrant to suggest acute appendicitis.  No abnormal wall thickening is seen about the bowels.  Bladder is unremarkable.  Prostate is within normal limits.  No free air is identified.  There is a small amount of free fluid within the pelvis  No pathologically enlarged intra-abdominal pelvic lymph nodes are identified.  No acute osseous abnormality identified.  Diffuse anasarca is present.  IMPRESSION:  1.  Ill definition of the pancreas with inflammatory fat stranding of the peripancreatic fat, suggestive of acute pancreatitis. Correlation with serum amylase/lipase is recommended.  No loculated collection to suggest pseudocyst formation identified. 2.  Nonvisualization of the appendix, however, no focal inflammatory changes seen within the right lower quadrant to suggest acute appendicitis. 3. Patchy nodular opacities within the partially visualized lung bases.  Finding is of uncertain significance, but may represent a possible infectious process. 4. Cardiomegaly with trace pericardial effusion. 5.  Small amount of free pelvic fluid. 6.  Anasarca.   Original Report Authenticated By: Jeannine Boga, M.D.   Dg  Chest 1 View  01/24/2013   *RADIOLOGY REPORT*  Clinical Data: Hypertension.  Abdominal pain.  CHEST - 1 VIEW  Comparison: 01/06/2013.  Findings: Chronic cardiopericardial enlargement.  Prominent, cephalized pulmonary venous markings.  No focal infiltrate, effusion, pneumothorax.  No pneumoperitoneum.  IMPRESSION:  Cardiomegaly and pulmonary venous hypertension.   Original Report Authenticated By: Jorje Guild   Dg Chest 2 View  01/06/2013   *RADIOLOGY REPORT*  Clinical Data: Severe shortness of breath  CHEST - 2 VIEW  Comparison: 09/26/2012  Findings: Cardiomegaly is noted, slightly more prominent than previously, with central vascular congestion and Kerley B lines indicating interstitial edema.  No pleural effusion.  No acute osseous finding.  IMPRESSION: Cardiomegaly with interstitial edema.  No focal acute finding.   Original Report Authenticated By: Conchita Paris, M.D.       Date: 01/24/2013  Rate: 107  Rhythm: sinus tachycardia  QRS Axis: normal  Intervals: QT prolonged  ST/T Wave abnormalities: nonspecific ST changes  Conduction Disutrbances:none  Narrative Interpretation: Sinus tach with LVH  Old EKG Reviewed: no sig changes  CRITICAL CARE Performed by: Teressa Lower Total critical care time: 40 Critical care time was exclusive of separately billable procedures and treating other patients. Critical care was necessary to treat or prevent imminent or life-threatening deterioration. Critical care was time spent personally by me on the following activities: development of treatment plan with patient and/or surrogate as well as nursing, discussions with consultants, evaluation of patient's response to treatment, examination of patient, obtaining history from patient or surrogate, ordering and performing treatments and interventions, ordering and review of laboratory studies, ordering and review of radiographic studies, pulse oximetry and re-evaluation of patient's condition. Requiring IV  labetalol drip. Initial map 200, improved with IV labetalol goal map 160 in the ED. Non-IV contrast CT scan reviewed with radiologist, poor visualization of pancreas without IV contrast? Pancreatitis. Lipase obtained within normal limits. No emesis in the ED.   5:35 AM d/w DR Posey Pronto, will admit SDU  MDM  DX: ABD pain, HTN emergency, Renal Insuff EKG, labs, imaging IV labetalol drip Admit step down unit   Teressa Lower, MD 01/24/13 [phone]

## 2013-01-25 DIAGNOSIS — R7989 Other specified abnormal findings of blood chemistry: Secondary | ICD-10-CM | POA: Diagnosis present

## 2013-01-25 DIAGNOSIS — R339 Retention of urine, unspecified: Secondary | ICD-10-CM | POA: Diagnosis present

## 2013-01-25 LAB — T4, FREE: Free T4: 1.54 ng/dL (ref 0.80–1.80)

## 2013-01-25 LAB — CBC
HCT: 27.6 % — ABNORMAL LOW (ref 39.0–52.0)
MCH: 23.3 pg — ABNORMAL LOW (ref 26.0–34.0)
MCHC: 32.6 g/dL (ref 30.0–36.0)
MCV: 71.3 fL — ABNORMAL LOW (ref 78.0–100.0)
RDW: 19.7 % — ABNORMAL HIGH (ref 11.5–15.5)
WBC: 13.3 10*3/uL — ABNORMAL HIGH (ref 4.0–10.5)

## 2013-01-25 LAB — RENAL FUNCTION PANEL
BUN: 57 mg/dL — ABNORMAL HIGH (ref 6–23)
CO2: 21 mEq/L (ref 19–32)
Calcium: 8.7 mg/dL (ref 8.4–10.5)
GFR calc Af Amer: 16 mL/min — ABNORMAL LOW (ref >90)
Glucose, Bld: 120 mg/dL — ABNORMAL HIGH (ref 70–99)
Sodium: 138 mEq/L (ref 135–145)

## 2013-01-25 LAB — HEPATIC FUNCTION PANEL
ALT: 58 U/L — ABNORMAL HIGH (ref 0–53)
AST: 19 U/L (ref 0–37)
Bilirubin, Direct: 0.4 mg/dL — ABNORMAL HIGH (ref 0.0–0.3)

## 2013-01-25 LAB — GLUCOSE, CAPILLARY
Glucose-Capillary: 119 mg/dL — ABNORMAL HIGH (ref 70–99)
Glucose-Capillary: 126 mg/dL — ABNORMAL HIGH (ref 70–99)
Glucose-Capillary: 135 mg/dL — ABNORMAL HIGH (ref 70–99)

## 2013-01-25 LAB — LIPID PANEL: Cholesterol: 107 mg/dL (ref 0–200)

## 2013-01-25 MED ORDER — MORPHINE SULFATE 4 MG/ML IJ SOLN
4.0000 mg | Freq: Once | INTRAMUSCULAR | Status: AC
  Administered 2013-01-25: 4 mg via INTRAVENOUS
  Filled 2013-01-25: qty 1

## 2013-01-25 MED ORDER — INSULIN ASPART 100 UNIT/ML ~~LOC~~ SOLN: 0.0000 [IU] | Freq: Every day | SUBCUTANEOUS | Status: DC

## 2013-01-25 MED ORDER — INSULIN ASPART 100 UNIT/ML ~~LOC~~ SOLN
0.0000 [IU] | Freq: Three times a day (TID) | SUBCUTANEOUS | Status: DC
  Administered 2013-01-25 (×2): 1 [IU] via SUBCUTANEOUS

## 2013-01-25 NOTE — Progress Notes (Signed)
[facility] ROUNDING NOTE   Subjective:   Interval History:improved blood pressure control  Objective:  Vital signs in last 24 hours:  Temp:  [95.1 F (35.1 C)-98.3 F (36.8 C)] 96 F (35.6 C) (09/02 0800) Pulse Rate:  [66-76] 70 (09/02 1026) Resp:  [0-33] 16 (09/02 1026) BP: (111-165)/(56-109) 133/88 mmHg (09/02 1026) SpO2:  [95 %-100 %] 100 % (09/02 1026) Weight:  [119 kg (262 lb 5.6 oz)-121.6 kg (268 lb 1.3 oz)] 119 kg (262 lb 5.6 oz) (09/02 1026)  Weight change: 9.101 kg (20 lb 1 oz) Filed Weights   01/24/13 0800 01/25/13 0000 01/25/13 1026  Weight: 122.5 kg (270 lb 1 oz) 121.6 kg (268 lb 1.3 oz) 119 kg (262 lb 5.6 oz)    Intake/Output: I/O last 3 completed shifts: In: 585.5 [P.O.:120; I.V.:465.5] Out: 3550 [Urine:3550]   Intake/Output this shift:  Total I/O In: 200 [P.O.:200] Out: 1200 [Urine:1200]  CVS- RRR RS- CTA ABD- BS present soft non-distended EXT- no edema   Basic Metabolic Panel:  Recent Labs Lab 01/24/13 0045 01/24/13 0108 01/25/13 0338  NA 136 138 138  K 4.2 4.9 3.7  CL 101 105 103  CO2 20  --  21  GLUCOSE 136* 136* 120*  BUN 57* 75* 57*  CREATININE 4.74* 5.10* 5.10*  CALCIUM 9.1  --  8.7  PHOS  --   --  5.4*    Liver Function Tests:  Recent Labs Lab 01/24/13 0045 01/25/13 0338  AST 40* 19  ALT 61* 58*  ALKPHOS 229* 176*  BILITOT 1.8* 1.1  PROT 7.1 6.2  ALBUMIN 3.1* 2.8*  2.8*    Recent Labs Lab 01/24/13 0441  LIPASE 22   No results found for this basename: AMMONIA,  in the last 168 hours  CBC:  Recent Labs Lab 01/24/13 0045 01/24/13 0108 01/25/13 0338  WBC 14.0*  --  13.3*  HGB 10.0* 11.6* 9.0*  HCT 30.5* 34.0* 27.6*  MCV 71.6*  --  71.3*  PLT 406*  --  286    Cardiac Enzymes:  Recent Labs Lab 01/24/13 0720 01/24/13 1245 01/24/13 1854  TROPONINI <0.30 <0.30 <0.30    BNP: No components found with this basename: POCBNP,   CBG:  Recent Labs Lab 01/24/13 1652 01/24/13 2017  01/24/13 2338 01/25/13 0323 01/25/13 0806  GLUCAP 125* 129* 114* 119* 126*    Microbiology: Results for orders placed during the hospital encounter of 01/23/13  MRSA PCR SCREENING     Status: None   Collection Time    01/24/13  6:45 AM      Result Value Range Status   MRSA by PCR NEGATIVE  NEGATIVE Final   Comment:            The GeneXpert MRSA Assay (FDA     approved for NASAL specimens     only), is one component of a     comprehensive MRSA colonization     surveillance program. It is not     intended to diagnose MRSA     infection nor to guide or     monitor treatment for     MRSA infections.    Coagulation Studies:  Recent Labs  01/25/13 0338  LABPROT 15.6*  INR 1.27    Urinalysis:  Recent Labs  01/24/13 0136  COLORURINE YELLOW  LABSPEC 1.024  PHURINE 5.0  GLUCOSEU NEGATIVE  HGBUR TRACE*  BILIRUBINUR NEGATIVE  KETONESUR NEGATIVE  PROTEINUR >300*  UROBILINOGEN 1.0  NITRITE NEGATIVE  LEUKOCYTESUR NEGATIVE  Imaging: Ct Abdomen Pelvis Wo Contrast  01/24/2013   *RADIOLOGY REPORT*  Clinical Data: Right lower quadrant abdominal pain  CT ABDOMEN AND PELVIS WITHOUT CONTRAST  Technique:  Multidetector CT imaging of the abdomen and pelvis was performed following the standard protocol without intravenous contrast. Small amount of oral contrast was administered.  Comparison: None.  Findings: Patchy nodular opacities are present within the partially visualized lung bases, incompletely evaluated.  Cardiomegaly is present.  There is a small pericardial effusion.  Limited noncontrast evaluation the liver is unremarkable.  The gallbladder is normal.  The spleen, adrenal glands are normal.  The pancreas is somewhat ill-defined with prominent peripancreatic fat stranding, suggestive of acute pancreatitis. No loculated fluid collection to suggest pseudocyst formation.  No definite evidence of traumatic necrosis, although evaluation this is markedly limited on this  noncontrast examination.  Pancreatic duct is not visualized.  There is no evidence of bowel obstruction. The appendix is not definitely visualized, however, no definite focal inflammatory changes are seen within the right lower quadrant to suggest acute appendicitis.  No abnormal wall thickening is seen about the bowels.  Bladder is unremarkable.  Prostate is within normal limits.  No free air is identified.  There is a small amount of free fluid within the pelvis  No pathologically enlarged intra-abdominal pelvic lymph nodes are identified.  No acute osseous abnormality identified.  Diffuse anasarca is present.  IMPRESSION:  1.  Ill definition of the pancreas with inflammatory fat stranding of the peripancreatic fat, suggestive of acute pancreatitis. Correlation with serum amylase/lipase is recommended.  No loculated collection to suggest pseudocyst formation identified. 2.  Nonvisualization of the appendix, however, no focal inflammatory changes seen within the right lower quadrant to suggest acute appendicitis. 3. Patchy nodular opacities within the partially visualized lung bases.  Finding is of uncertain significance, but may represent a possible infectious process. 4. Cardiomegaly with trace pericardial effusion. 5.  Small amount of free pelvic fluid. 6.  Anasarca.   Original Report Authenticated By: Jeannine Boga, M.D.   Dg Chest 1 View  01/24/2013   *RADIOLOGY REPORT*  Clinical Data: Hypertension.  Abdominal pain.  CHEST - 1 VIEW  Comparison: 01/06/2013.  Findings: Chronic cardiopericardial enlargement.  Prominent, cephalized pulmonary venous markings.  No focal infiltrate, effusion, pneumothorax.  No pneumoperitoneum.  IMPRESSION:  Cardiomegaly and pulmonary venous hypertension.   Original Report Authenticated By: Jorje Guild   US Abdomen Complete  01/24/2013   *RADIOLOGY REPORT*  Clinical Data:  Right-sided abdominal pain.  COMPLETE ABDOMINAL ULTRASOUND  Comparison:  01/24/2013 CT.  Findings:   Gallbladder:  Gallbladder sludge with shadowing stone.  There is gallbladder wall thickening up to 5 mm, not seen on previous MRI. No sonographic Murphy's sign or marked marked distension.  Common bile duct:  Measures 4 mm.  Liver:  No focal lesion identified.  Within normal limits in parenchymal echogenicity.  IVC:  Appears normal.  Pancreas:  Reference CT examination showing peripancreatic edema. No sonographic abnormality.  Spleen:  Borderline enlarged, 12 cm.  Right Kidney:  Measures 11 cm.  The cortex is diffusely echogenic. No hydronephrosis or focal abnormality.  Left Kidney:  Measures 10 cm.  The cortex is diffusely echogenic. No hydronephrosis or focal abnormality.  Abdominal aorta:  No aneurysm where seen.  Other:  Trace ascites around the liver and gallbladder.  IMPRESSION:  1.  Cholelithiasis and nonspecific gallbladder wall thickening. Lack of sonographic Murphy's sign or marked gallbladder distension argue against acute cholecystitis. 2.  Findings consistent with  medical renal disease. 3.  Trace ascites.   Original Report Authenticated By: Jorje Guild     Medications:     . amLODipine  10 mg Oral Daily  . aspirin  325 mg Oral Daily  . furosemide  80 mg Oral BID  . gabapentin  300 mg Oral QHS  . heparin  5,000 Units Subcutaneous Q8H  . insulin aspart  0-5 Units Subcutaneous QHS  . insulin aspart  0-9 Units Subcutaneous TID WC  . labetalol  300 mg Oral TID  . pantoprazole (PROTONIX) IV  40 mg Intravenous Q24H  . sodium chloride  3 mL Intravenous Q12H   ondansetron (ZOFRAN) IV, ondansetron, oxyCODONE  Assessment/ Plan:  1.Hypertensive emergency. This may be secondary to non adherence although patient does claim general compliance. He has no encephalopathy although his renal function is worsening. I shall simplify his regimen today and transition him to oral agents. The use of ACE or ARB may not be possible due to CKD 5.  2.CKD 5 i anticipate dialysis will be required at some point  and will need access and dialysis education 3. Anemia stable 4. Bones PTH pending 5. Discussed with Dr Moshe Cipro. Patient missed last visit. She shall see him at next scheduled appointment   LOS: 2 Fara Worthy W @TODAY @11 :15 AM

## 2013-01-25 NOTE — Progress Notes (Signed)
[facility] PROGRESS NOTE  Joshua [name] [id number] DOB: [birth year] DOA: 01/23/2013 PCP: [facility]  Brief narrative: Joshua [name] is an 32 y.o. male with PMH of hypertension, type 2 diabetes, atrial fibrillation, OSA (non-compliant with CPAP) and stage IV chronic kidney disease who was admitted on 01/23/2013 with hypertensive emergency with evidence of end organ damage requiring ICU care. He is being followed by nephrology who has assisted with his antihypertensive regimen. He has also been found to have some urinary retention.  Assessment/Plan: Principal Problem:   Hypertensive emergency -Admitted to SDU for closer monitoring.  Hemodynamically stable now, more awake/alert. -Current treatment: Norvasc, clonidine, Lasix and oral labetalol. IV labetalol was successfully weaned 01/24/2013. -He has had basic workup for secondary causes of hypertension, no definite RAS, normal aldosterone/PRA ratio recently, elevated urinary metanephrines and normetanephrines however MRI reportedly without adrenal mass to suggest pheochromocytoma. -Status post protein electrophoresis 01/08/2013, nonspecific pattern (seen by cardiologist on previous admission 01/08/2013 with recommendations to obtain SPEP/UPEP and serum free light chain assay).  ? Amyloidosis raised. -Dr. Justin Mend of nephrology is providing assistance with management. Active Problems:   Urinary retention -I and O. cath every 6 hours as needed.  Suspect this will resolve with improvement in patient's mental status.   Elevated TSH -Check free T4.   Pancreatitis -Patient has pancreatic inflammation on CT scan but no elevation of lipase. -Check FLP rule out hypertriglyceridemia. -Advance diet and monitor.   Diabetes -Advance diet to carb modified. -Continue SSI with insulin sensitive coverage. CBGs 119-146.   Chronic kidney disease (CKD), stage IV (severe) -Baseline creatinine 3.56. -Current creatinine remains elevated over usual  baseline values at 5.1.   Anemia in chronic kidney disease -Baseline hemoglobin 8.7-9.7. Current hemoglobin stable.   Secondary cardiomyopathy / Acute on chronic combined systolic and diastolic CHF (congestive heart failure) -Marked elevation in proBNP noted.   -I&O balance down 2.8 L with diuretics. -Cycling troponins Q 6 hours.  Negative x3.   Headache(784.0) -Secondary to hypertensive emergency.   Elevated LFTs -Likely from passive congestion of the liver secondary to CHF. Trending down. -Status post abdominal ultrasound and CT of the abdomen. No evidence of acute cholecystitis.   Code Status: Full. Family Communication: No family at bedside. Disposition Plan: Home when stable.   Medical Consultants:  Dr. Edrick Oh, Nephrology  Other Consultants:  None.  Anti-infectives:  None.  HPI/Subjective: Joshua [name] is awake and alert now.  Had some urinary retention last night.  No complaints of current headache or chest pain.  Hungry. No nausea or vomiting.  Objective: Filed Vitals:   01/25/13 0000 01/25/13 0200 01/25/13 0400 01/25/13 0600  BP: 141/83 135/79 140/89 147/100  Pulse: 76 74 73 70  Temp: 98 F (36.7 C)  97 F (36.1 C)   TempSrc: Oral  Oral   Resp: 19 33 23 23  Height:      Weight: 121.6 kg (268 lb 1.3 oz)     SpO2: 100% 97% 100% 100%    Intake/Output Summary (Last 24 hours) at 01/25/13 0709 Last data filed at 01/25/13 0500  Gross per 24 hour  Intake  412.5 ml  Output   3250 ml  Net -2837.5 ml    Exam: Gen: Awake and alert. Cardiovascular:  RRR, No M/R/G Respiratory:  Lung sounds are diminished  Gastrointestinal:  Abdomen soft, NT/ND, + BS Extremities:  Trace edema  Data Reviewed: Basic Metabolic Panel:  Recent Labs Lab 01/24/13 0045 01/24/13 0108 01/25/13 0338  NA 136 138 138  K 4.2 4.9 3.7  CL 101 105 103  CO2 20  --  21  GLUCOSE 136* 136* 120*  BUN 57* 75* 57*  CREATININE 4.74* 5.10* 5.10*  CALCIUM 9.1  --  8.7  PHOS  --    --  5.4*   GFR Estimated Creatinine Clearance: 27.8 ml/min (by C-G formula based on Cr of 5.1). Liver Function Tests:  Recent Labs Lab 01/24/13 0045 01/25/13 0338  AST 40* 19  ALT 61* 58*  ALKPHOS 229* 176*  BILITOT 1.8* 1.1  PROT 7.1 6.2  ALBUMIN 3.1* 2.8*  2.8*    Recent Labs Lab 01/24/13 0441  LIPASE 22   Coagulation profile  Recent Labs Lab 01/25/13 0338  INR 1.27    CBC:  Recent Labs Lab 01/24/13 0045 01/24/13 0108 01/25/13 0338  WBC 14.0*  --  13.3*  HGB 10.0* 11.6* 9.0*  HCT 30.5* 34.0* 27.6*  MCV 71.6*  --  71.3*  PLT 406*  --  286   Cardiac Enzymes:  Recent Labs Lab 01/24/13 0720 01/24/13 1245 01/24/13 1854  TROPONINI <0.30 <0.30 <0.30   CBG:  Recent Labs Lab 01/24/13 0853 01/24/13 1350 01/24/13 1652 01/24/13 2017 01/25/13 0323  GLUCAP 146* 134* 125* 129* 119*   Hgb A1c  Recent Labs  01/24/13 0720  HGBA1C 5.6   Thyroid function studies  Recent Labs  01/24/13 0720  TSH 6.491*   Microbiology Recent Results (from the past 240 hour(s))  MRSA PCR SCREENING     Status: None   Collection Time    01/24/13  6:45 AM      Result Value Range Status   MRSA by PCR NEGATIVE  NEGATIVE Final   Comment:            The GeneXpert MRSA Assay (FDA     approved for NASAL specimens     only), is one component of a     comprehensive MRSA colonization     surveillance program. It is not     intended to diagnose MRSA     infection nor to guide or     monitor treatment for     MRSA infections.      Procedures and Diagnostic Studies:  Ct Abdomen Pelvis Wo Contrast 01/24/2013   *RADIOLOGY REPORT*  Clinical Data: Right lower quadrant abdominal pain  CT ABDOMEN AND PELVIS WITHOUT CONTRAST  Technique:  Multidetector CT imaging of the abdomen and pelvis was performed following the standard protocol without intravenous contrast. Small amount of oral contrast was administered.  Comparison: None.  Findings: Patchy nodular opacities are present  within the partially visualized lung bases, incompletely evaluated.  Cardiomegaly is present.  There is a small pericardial effusion.  Limited noncontrast evaluation the liver is unremarkable.  The gallbladder is normal.  The spleen, adrenal glands are normal.  The pancreas is somewhat ill-defined with prominent peripancreatic fat stranding, suggestive of acute pancreatitis. No loculated fluid collection to suggest pseudocyst formation.  No definite evidence of traumatic necrosis, although evaluation this is markedly limited on this noncontrast examination.  Pancreatic duct is not visualized.  There is no evidence of bowel obstruction. The appendix is not definitely visualized, however, no definite focal inflammatory changes are seen within the right lower quadrant to suggest acute appendicitis.  No abnormal wall thickening is seen about the bowels.  Bladder is unremarkable.  Prostate is within normal limits.  No free air is identified.  There is a small amount of free fluid within the pelvis  No pathologically enlarged intra-abdominal pelvic lymph nodes are identified.  No acute osseous abnormality identified.  Diffuse anasarca is present.  IMPRESSION:  1.  Ill definition of the pancreas with inflammatory fat stranding of the peripancreatic fat, suggestive of acute pancreatitis. Correlation with serum amylase/lipase is recommended.  No loculated collection to suggest pseudocyst formation identified. 2.  Nonvisualization of the appendix, however, no focal inflammatory changes seen within the right lower quadrant to suggest acute appendicitis. 3. Patchy nodular opacities within the partially visualized lung bases.  Finding is of uncertain significance, but may represent a possible infectious process. 4. Cardiomegaly with trace pericardial effusion. 5.  Small amount of free pelvic fluid. 6.  Anasarca.   Original Report Authenticated By: Jeannine Boga, M.D.    Dg Chest 1 View 01/24/2013   *RADIOLOGY REPORT*   Clinical Data: Hypertension.  Abdominal pain.  CHEST - 1 VIEW  Comparison: 01/06/2013.  Findings: Chronic cardiopericardial enlargement.  Prominent, cephalized pulmonary venous markings.  No focal infiltrate, effusion, pneumothorax.  No pneumoperitoneum.  IMPRESSION:  Cardiomegaly and pulmonary venous hypertension.   Original Report Authenticated By: Jorje Guild    US Abdomen Complete 01/24/2013   *RADIOLOGY REPORT*  Clinical Data:  Right-sided abdominal pain.  COMPLETE ABDOMINAL ULTRASOUND  Comparison:  01/24/2013 CT.  Findings:  Gallbladder:  Gallbladder sludge with shadowing stone.  There is gallbladder wall thickening up to 5 mm, not seen on previous MRI. No sonographic Murphy's sign or marked marked distension.  Common bile duct:  Measures 4 mm.  Liver:  No focal lesion identified.  Within normal limits in parenchymal echogenicity.  IVC:  Appears normal.  Pancreas:  Reference CT examination showing peripancreatic edema. No sonographic abnormality.  Spleen:  Borderline enlarged, 12 cm.  Right Kidney:  Measures 11 cm.  The cortex is diffusely echogenic. No hydronephrosis or focal abnormality.  Left Kidney:  Measures 10 cm.  The cortex is diffusely echogenic. No hydronephrosis or focal abnormality.  Abdominal aorta:  No aneurysm where seen.  Other:  Trace ascites around the liver and gallbladder.  IMPRESSION:  1.  Cholelithiasis and nonspecific gallbladder wall thickening. Lack of sonographic Murphy's sign or marked gallbladder distension argue against acute cholecystitis. 2.  Findings consistent with medical renal disease. 3.  Trace ascites.   Original Report Authenticated By: Jorje Guild    Scheduled Meds: . amLODipine  10 mg Oral Daily  . aspirin  325 mg Oral Daily  . cloNIDine  0.1 mg Oral TID  . furosemide  80 mg Oral BID  . gabapentin  300 mg Oral QHS  . heparin  5,000 Units Subcutaneous Q8H  . insulin aspart  0-9 Units Subcutaneous Q4H  . labetalol  300 mg Oral TID  . pantoprazole  (PROTONIX) IV  40 mg Intravenous Q24H  . sodium chloride  3 mL Intravenous Q12H   Continuous Infusions:    Time spent: 35 minutes with > 50% of time discussing current diagnostic test results, clinical impression and plan of care.    LOS: 2 days   Carolan Avedisian  [facility] Pager [phone].   *Please note that the hospitalists switch teams on Wednesdays. Please call the flow manager at [phone] if you are having difficulty reaching the hospitalist taking care of this patient as she can update you and provide the most up-to-date pager number of provider caring for the patient. If 8PM-8AM, please contact night-coverage at [email or web address], password [email or web address]  01/25/2013, 7:09 AM

## 2013-01-26 DIAGNOSIS — I5043 Acute on chronic combined systolic (congestive) and diastolic (congestive) heart failure: Secondary | ICD-10-CM

## 2013-01-26 DIAGNOSIS — N184 Chronic kidney disease, stage 4 (severe): Secondary | ICD-10-CM

## 2013-01-26 DIAGNOSIS — I509 Heart failure, unspecified: Secondary | ICD-10-CM

## 2013-01-26 DIAGNOSIS — E119 Type 2 diabetes mellitus without complications: Secondary | ICD-10-CM

## 2013-01-26 LAB — GLUCOSE, CAPILLARY: Glucose-Capillary: 166 mg/dL — ABNORMAL HIGH (ref 70–99)

## 2013-01-26 LAB — RENAL FUNCTION PANEL
CO2: 25 mEq/L (ref 19–32)
GFR calc Af Amer: 17 mL/min — ABNORMAL LOW (ref >90)
Glucose, Bld: 125 mg/dL — ABNORMAL HIGH (ref 70–99)
Potassium: 3.3 mEq/L — ABNORMAL LOW (ref 3.5–5.1)
Sodium: 137 mEq/L (ref 135–145)

## 2013-01-26 MED ORDER — POTASSIUM CHLORIDE CRYS ER 20 MEQ PO TBCR
20.0000 meq | EXTENDED_RELEASE_TABLET | Freq: Once | ORAL | Status: DC
  Filled 2013-01-26: qty 1

## 2013-01-26 MED ORDER — AMLODIPINE BESYLATE 10 MG PO TABS: 10.0000 mg | ORAL_TABLET | Freq: Every day | ORAL | Status: DC

## 2013-01-26 MED ORDER — FUROSEMIDE 80 MG PO TABS: 80.0000 mg | ORAL_TABLET | Freq: Two times a day (BID) | ORAL | Status: DC

## 2013-01-26 MED ORDER — LABETALOL HCL 300 MG PO TABS: 300.0000 mg | ORAL_TABLET | Freq: Three times a day (TID) | ORAL | Status: DC

## 2013-01-26 MED ORDER — POTASSIUM CHLORIDE CRYS ER 20 MEQ PO TBCR
40.0000 meq | EXTENDED_RELEASE_TABLET | Freq: Once | ORAL | Status: AC
  Administered 2013-01-26: 11:00:00 40 meq via ORAL
  Filled 2013-01-26: qty 2

## 2013-01-26 MED ORDER — POTASSIUM CHLORIDE 20 MEQ PO PACK: 20.0000 meq | PACK | Freq: Once | ORAL | Status: DC

## 2013-01-26 NOTE — Discharge Summary (Signed)
Physician Discharge Summary  [name] [id number] DOB: 1980/11/20 DOA: 01/23/2013  PCP: Provider Not In System  Admit date: 01/23/2013 Discharge date: 01/26/2013  Time spent: 35  minutes  Recommendations for Outpatient Follow-up:  1. Check CBC and bmp to check potassium in 1 to 2 days with home health RN and notify the results to his nephrologist for evaluation.  2. Follow up with PCP and nephrologist in one week.   Discharge Diagnoses:  Principal Problem:   Hypertensive emergency Active Problems:   Diabetes   Chronic kidney disease (CKD), stage IV (severe)   Anemia in chronic kidney disease   Secondary cardiomyopathy   Headache(784.0)   Chest pain   Elevated LFTs   Acute on chronic combined systolic and diastolic CHF (congestive heart failure)   Urinary retention   Elevated TSH   Discharge Condition: improved.   Diet recommendation: low sodium diet.   Filed Weights   01/25/13 0000 01/25/13 1026 01/26/13 0500  Weight: 121.6 kg (268 lb 1.3 oz) 119 kg (262 lb 5.6 oz) 116.257 kg (256 lb 4.8 oz)    History of present illness:  Joshua [name] is an 32 y.o. male with PMH of hypertension, type 2 diabetes, atrial fibrillation, OSA (non-compliant with CPAP) and stage IV chronic kidney disease who was admitted on 01/23/2013 with hypertensive emergency with evidence of end organ damage requiring ICU care. He is being followed by nephrology who has assisted with his antihypertensive regimen. His blood pressures and renal parameters improved and he is being discharged home to follow up with nephrologist ino ne week. Discussed with him the importance of adherence to mediations and that he will need HD in the near future, and will require access and HD education.    Hospital Course:   Hypertensive emergency  -Admitted to SDU for closer monitoring. Hemodynamically stable now, more awake/alert.  -Current treatment: Norvasc, clonidine, Lasix and oral labetalol. IV labetalol was  successfully weaned 01/24/2013.  -He has had basic workup for secondary causes of hypertension, no definite RAS, normal aldosterone/PRA ratio recently, elevated urinary metanephrines and normetanephrines however MRI reportedly without adrenal mass to suggest pheochromocytoma.  -Status post protein electrophoresis 01/08/2013, nonspecific pattern (seen by cardiologist on previous admission 01/08/2013 with recommendations to obtain SPEP/UPEP and serum free light chain assay). ? Amyloidosis raised.  -Dr. Justin Mend of nephrology consulted and recommended outpatient follow up.   His blood pressures and renal parameters improved and he is being discharged home to follow up with nephrologist ino ne week. Discussed with him the importance of adherence to mediations and that he will need HD in the near future, and will require access and HD education.   Urinary retention  -resolved.  Elevated TSH  - free T4 normal.  Pancreatitis  -Patient has pancreatic inflammation on CT scan but no elevation of lipase.  -Checked FLP rule out hypertriglyceridemia. His triglyceride level is 105 - his abd pain improved.   Diabetes  -Advance diet to carb modified.  hgab1c as outpatient.  Chronic kidney disease (CKD), stage IV (severe)  -Baseline creatinine 3.56.  -Current creatinine remains elevated over usual baseline values at 5.1.  Anemia in chronic kidney disease  -Baseline hemoglobin 8.7-9.7. Current hemoglobin stable.  Secondary cardiomyopathy / Acute on chronic combined systolic and diastolic CHF (congestive heart failure)  -Marked elevation in proBNP noted.  -I&O balance down 2.8 L with diuretics.  -Cycling troponins Q 6 hours. Negative x3.  Headache(784.0)  -Secondary to hypertensive emergency.  Elevated LFTs  -Likely from passive congestion  of the liver secondary to CHF. Trending down.  -Status post abdominal ultrasound and CT of the abdomen. No evidence of acute cholecystitis.       Procedures:  NONE  Consultations:  NEPHROLOGY  Discharge Exam: Filed Vitals:   01/26/13 0443  BP: 140/89  Pulse: 75  Temp: 97.9 F (36.6 C)  Resp: 16    General: alert afebrile comfortable Cardiovascular: s1s2 Respiratory: CTAB  Discharge Instructions      Discharge Orders   Future Appointments Provider Department Dept Phone   02/03/2013 11:40 AM [facility] [place] [phone]   Future Orders Complete By Expires   Diet - low sodium heart healthy  As directed    Discharge instructions  As directed    Comments:     FOLLOW UP with PCP and nephrologist in one week.  Please check potassium tomorrow .       Medication List    STOP taking these medications       carvedilol 25 MG tablet  Commonly known as:  COREG     hydrALAZINE 100 MG tablet  Commonly known as:  APRESOLINE     isosorbide mononitrate 60 MG 24 hr tablet  Commonly known as:  IMDUR     lisinopril 40 MG tablet  Commonly known as:  PRINIVIL,ZESTRIL     NIFEdipine 60 MG 24 hr tablet  Commonly known as:  PROCARDIA-XL/ADALAT CC      TAKE these medications       amLODipine 10 MG tablet  Commonly known as:  NORVASC  Take 1 tablet (10 mg total) by mouth daily.     aspirin 325 MG tablet  Take 325 mg by mouth daily.     cloNIDine 0.3 MG tablet  Commonly known as:  CATAPRES  Take 1 tablet (0.3 mg total) by mouth 3 (three) times daily.     ferrous sulfate 325 (65 FE) MG tablet  Take 325 mg by mouth 2 (two) times daily.     furosemide 80 MG tablet  Commonly known as:  LASIX  Take 1 tablet (80 mg total) by mouth 2 (two) times daily.     gabapentin 100 MG capsule  Commonly known as:  NEURONTIN  Take 3 capsules (300 mg total) by mouth 3 (three) times daily.     labetalol 300 MG tablet  Commonly known as:  NORMODYNE  Take 1 tablet (300 mg total) by mouth 3 (three) times daily.     ondansetron 4 MG tablet  Commonly known as:  ZOFRAN   Take 1 tablet (4 mg total) by mouth every 6 (six) hours as needed for nausea.     traMADol 50 MG tablet  Commonly known as:  ULTRAM  Take 1 tablet (50 mg total) by mouth every 8 (eight) hours as needed.       No Known Allergies Follow-up Information   Follow up with wellness clinic. Schedule an appointment as soon as possible for a visit in 1 week.      Follow up with GOLDSBOROUGH,KELLIE A, MD. Schedule an appointment as soon as possible for a visit in 1 week.   Specialty:  Nephrology   Contact information:   [place] [id number] [phone]        The results of significant diagnostics from this hospitalization (including imaging, microbiology, ancillary and laboratory) are listed below for reference.    Significant Diagnostic Studies: Ct Abdomen Pelvis Wo Contrast  01/24/2013   *  RADIOLOGY REPORT*  Clinical Data: Right lower quadrant abdominal pain  CT ABDOMEN AND PELVIS WITHOUT CONTRAST  Technique:  Multidetector CT imaging of the abdomen and pelvis was performed following the standard protocol without intravenous contrast. Small amount of oral contrast was administered.  Comparison: None.  Findings: Patchy nodular opacities are present within the partially visualized lung bases, incompletely evaluated.  Cardiomegaly is present.  There is a small pericardial effusion.  Limited noncontrast evaluation the liver is unremarkable.  The gallbladder is normal.  The spleen, adrenal glands are normal.  The pancreas is somewhat ill-defined with prominent peripancreatic fat stranding, suggestive of acute pancreatitis. No loculated fluid collection to suggest pseudocyst formation.  No definite evidence of traumatic necrosis, although evaluation this is markedly limited on this noncontrast examination.  Pancreatic duct is not visualized.  There is no evidence of bowel obstruction. The appendix is not definitely visualized, however, no definite focal inflammatory changes are seen within  the right lower quadrant to suggest acute appendicitis.  No abnormal wall thickening is seen about the bowels.  Bladder is unremarkable.  Prostate is within normal limits.  No free air is identified.  There is a small amount of free fluid within the pelvis  No pathologically enlarged intra-abdominal pelvic lymph nodes are identified.  No acute osseous abnormality identified.  Diffuse anasarca is present.  IMPRESSION:  1.  Ill definition of the pancreas with inflammatory fat stranding of the peripancreatic fat, suggestive of acute pancreatitis. Correlation with serum amylase/lipase is recommended.  No loculated collection to suggest pseudocyst formation identified. 2.  Nonvisualization of the appendix, however, no focal inflammatory changes seen within the right lower quadrant to suggest acute appendicitis. 3. Patchy nodular opacities within the partially visualized lung bases.  Finding is of uncertain significance, but may represent a possible infectious process. 4. Cardiomegaly with trace pericardial effusion. 5.  Small amount of free pelvic fluid. 6.  Anasarca.   Original Report Authenticated By: Jeannine Boga, M.D.   Dg Chest 1 View  01/24/2013   *RADIOLOGY REPORT*  Clinical Data: Hypertension.  Abdominal pain.  CHEST - 1 VIEW  Comparison: 01/06/2013.  Findings: Chronic cardiopericardial enlargement.  Prominent, cephalized pulmonary venous markings.  No focal infiltrate, effusion, pneumothorax.  No pneumoperitoneum.  IMPRESSION:  Cardiomegaly and pulmonary venous hypertension.   Original Report Authenticated By: Jorje Guild   Dg Chest 2 View  01/06/2013   *RADIOLOGY REPORT*  Clinical Data: Severe shortness of breath  CHEST - 2 VIEW  Comparison: 09/26/2012  Findings: Cardiomegaly is noted, slightly more prominent than previously, with central vascular congestion and Kerley B lines indicating interstitial edema.  No pleural effusion.  No acute osseous finding.  IMPRESSION: Cardiomegaly with  interstitial edema.  No focal acute finding.   Original Report Authenticated By: Conchita Paris, M.D.   US Abdomen Complete  01/24/2013   *RADIOLOGY REPORT*  Clinical Data:  Right-sided abdominal pain.  COMPLETE ABDOMINAL ULTRASOUND  Comparison:  01/24/2013 CT.  Findings:  Gallbladder:  Gallbladder sludge with shadowing stone.  There is gallbladder wall thickening up to 5 mm, not seen on previous MRI. No sonographic Murphy's sign or marked marked distension.  Common bile duct:  Measures 4 mm.  Liver:  No focal lesion identified.  Within normal limits in parenchymal echogenicity.  IVC:  Appears normal.  Pancreas:  Reference CT examination showing peripancreatic edema. No sonographic abnormality.  Spleen:  Borderline enlarged, 12 cm.  Right Kidney:  Measures 11 cm.  The cortex is diffusely echogenic. No hydronephrosis or  focal abnormality.  Left Kidney:  Measures 10 cm.  The cortex is diffusely echogenic. No hydronephrosis or focal abnormality.  Abdominal aorta:  No aneurysm where seen.  Other:  Trace ascites around the liver and gallbladder.  IMPRESSION:  1.  Cholelithiasis and nonspecific gallbladder wall thickening. Lack of sonographic Murphy's sign or marked gallbladder distension argue against acute cholecystitis. 2.  Findings consistent with medical renal disease. 3.  Trace ascites.   Original Report Authenticated By: Jorje Guild    Microbiology: Recent Results (from the past 240 hour(s))  MRSA PCR SCREENING     Status: None   Collection Time    01/24/13  6:45 AM      Result Value Range Status   MRSA by PCR NEGATIVE  NEGATIVE Final   Comment:            The GeneXpert MRSA Assay (FDA     approved for NASAL specimens     only), is one component of a     comprehensive MRSA colonization     surveillance program. It is not     intended to diagnose MRSA     infection nor to guide or     monitor treatment for     MRSA infections.     Labs: Basic Metabolic Panel:  Recent Labs Lab  01/24/13 0045 01/24/13 0108 01/25/13 0338 01/26/13 0435  NA 136 138 138 137  K 4.2 4.9 3.7 3.3*  CL 101 105 103 102  CO2 20  --  21 25  GLUCOSE 136* 136* 120* 125*  BUN 57* 75* 57* 54*  CREATININE 4.74* 5.10* 5.10* 4.89*  CALCIUM 9.1  --  8.7 8.1*  PHOS  --   --  5.4* 4.3   Liver Function Tests:  Recent Labs Lab 01/24/13 0045 01/25/13 0338 01/26/13 0435  AST 40* 19  --   ALT 61* 58*  --   ALKPHOS 229* 176*  --   BILITOT 1.8* 1.1  --   PROT 7.1 6.2  --   ALBUMIN 3.1* 2.8*  2.8* 2.7*    Recent Labs Lab 01/24/13 0441  LIPASE 22   No results found for this basename: AMMONIA,  in the last 168 hours CBC:  Recent Labs Lab 01/24/13 0045 01/24/13 0108 01/25/13 0338  WBC 14.0*  --  13.3*  HGB 10.0* 11.6* 9.0*  HCT 30.5* 34.0* 27.6*  MCV 71.6*  --  71.3*  PLT 406*  --  286   Cardiac Enzymes:  Recent Labs Lab 01/24/13 0720 01/24/13 1245 01/24/13 1854  TROPONINI <0.30 <0.30 <0.30   BNP: BNP (last 3 results)  Recent Labs  01/06/13 1847 01/24/13 0720  PROBNP 61864.0* >70000.0*   CBG:  Recent Labs Lab 01/25/13 0806 01/25/13 1150 01/25/13 1716 01/25/13 2054 01/26/13 0744  GLUCAP 126* 126* 135* 112* 103*       Signed:  Vinson Tietze  [facility] 01/26/2013, 10:26 AM

## 2013-01-26 NOTE — Care Management Note (Signed)
    Page 1 of 2   01/26/2013     1:22:43 PM   CARE MANAGEMENT NOTE 01/26/2013  Patient:  Joshua [name], Joshua [name]   Account Number:  [id number]  Date Initiated:  01/24/2013  Documentation initiated by:  DAVIS,RHONDA  Subjective/Objective Assessment:   hc of pancreatitis, now with htn crisis,     Action/Plan:   home when stable   Anticipated DC Date:  01/26/2013   Anticipated DC Plan:  [facility]  Patient refused services      Choice offered to / List presented to:             Status of service:  Completed, signed off Medicare Important Message given?   (If response is "NO", the following Medicare IM given date fields will be blank) Date Medicare IM given:   Date Additional Medicare IM given:    Discharge Disposition:  HOME/SELF CARE  Per UR Regulation:  Reviewed for med. necessity/level of care/duration of stay  If discussed at [place] of Stay Meetings, dates discussed:    Comments:  01/26/13 KKATHY Nancee Brownrigg RN,BSN NCM [place] APPT SET,SEE F/U SECTION OF D/C.HAS [place].PROVIDED [place] THAT [facility] WILL ASST TO ESTABLISH ACCESS TO HEALTH INSURANCE.HAS A PHARMACY,& ABLE TO PAY FOR MEDS.PATIENT DECLINES HHRN SINCE COST WOULD BE $150 A VISIT.MD NOTIFIED, PATIENT INFORMED TO CONTACT DR. GOLDSBOROGH OFFICE TOMORROW FOR A LAB DRAW FOR K+(NURSE WILL INFORM PATIENT).NO FURTHER D/C NEEDS.  [id number] Rosana Hoes, RN, BSN, [place] [phone] Chart Reviewed for discharge and hospital needs. Discharge needs at time of review:  None Review of patient progress due on [id number].

## 2013-01-26 NOTE — Procedures (Signed)
[facility] ROUNDING NOTE   Subjective:   Interval History: appears well controlled  Objective:  Vital signs in last 24 hours:  Temp:  [97.4 F (36.3 C)-97.9 F (36.6 C)] 97.9 F (36.6 C) (09/03 0443) Pulse Rate:  [68-75] 75 (09/03 0443) Resp:  [16-18] 16 (09/03 0443) BP: (132-162)/(88-108) 140/89 mmHg (09/03 0443) SpO2:  [100 %] 100 % (09/03 0443) Weight:  [116.257 kg (256 lb 4.8 oz)-119 kg (262 lb 5.6 oz)] 116.257 kg (256 lb 4.8 oz) (09/03 0500)  Weight change: -3.5 kg (-7 lb 11.5 oz) Filed Weights   01/25/13 0000 01/25/13 1026 01/26/13 0500  Weight: 121.6 kg (268 lb 1.3 oz) 119 kg (262 lb 5.6 oz) 116.257 kg (256 lb 4.8 oz)    Intake/Output: I/O last 3 completed shifts: In: 640 [P.O.:640] Out: 4050 [Urine:4050]   Intake/Output this shift:  Total I/O In: 480 [P.O.:480] Out: -   CVS- RRR RS- CTA ABD- BS present soft non-distended EXT- no edema   Basic Metabolic Panel:  Recent Labs Lab 01/24/13 0045 01/24/13 0108 01/25/13 0338 01/26/13 0435  NA 136 138 138 137  K 4.2 4.9 3.7 3.3*  CL 101 105 103 102  CO2 20  --  21 25  GLUCOSE 136* 136* 120* 125*  BUN 57* 75* 57* 54*  CREATININE 4.74* 5.10* 5.10* 4.89*  CALCIUM 9.1  --  8.7 8.1*  PHOS  --   --  5.4* 4.3    Liver Function Tests:  Recent Labs Lab 01/24/13 0045 01/25/13 0338 01/26/13 0435  AST 40* 19  --   ALT 61* 58*  --   ALKPHOS 229* 176*  --   BILITOT 1.8* 1.1  --   PROT 7.1 6.2  --   ALBUMIN 3.1* 2.8*  2.8* 2.7*    Recent Labs Lab 01/24/13 0441  LIPASE 22   No results found for this basename: AMMONIA,  in the last 168 hours  CBC:  Recent Labs Lab 01/24/13 0045 01/24/13 0108 01/25/13 0338  WBC 14.0*  --  13.3*  HGB 10.0* 11.6* 9.0*  HCT 30.5* 34.0* 27.6*  MCV 71.6*  --  71.3*  PLT 406*  --  286    Cardiac Enzymes:  Recent Labs Lab 01/24/13 0720 01/24/13 1245 01/24/13 1854  TROPONINI <0.30 <0.30 <0.30    BNP: No components found with this  basename: POCBNP,   CBG:  Recent Labs Lab 01/25/13 0806 01/25/13 1150 01/25/13 1716 01/25/13 2054 01/26/13 0744  GLUCAP 126* 126* 135* 112* 103*    Microbiology: Results for orders placed during the hospital encounter of 01/23/13  MRSA PCR SCREENING     Status: None   Collection Time    01/24/13  6:45 AM      Result Value Range Status   MRSA by PCR NEGATIVE  NEGATIVE Final   Comment:            The GeneXpert MRSA Assay (FDA     approved for NASAL specimens     only), is one component of a     comprehensive MRSA colonization     surveillance program. It is not     intended to diagnose MRSA     infection nor to guide or     monitor treatment for     MRSA infections.    Coagulation Studies:  Recent Labs  01/25/13 0338  LABPROT 15.6*  INR 1.27    Urinalysis:  Recent Labs  01/24/13 0136  COLORURINE YELLOW  LABSPEC 1.024  PHURINE 5.0  GLUCOSEU NEGATIVE  HGBUR TRACE*  BILIRUBINUR NEGATIVE  KETONESUR NEGATIVE  PROTEINUR >300*  UROBILINOGEN 1.0  NITRITE NEGATIVE  LEUKOCYTESUR NEGATIVE      Imaging: No results found.   Medications:     . amLODipine  10 mg Oral Daily  . aspirin  325 mg Oral Daily  . furosemide  80 mg Oral BID  . gabapentin  300 mg Oral QHS  . heparin  5,000 Units Subcutaneous Q8H  . insulin aspart  0-5 Units Subcutaneous QHS  . insulin aspart  0-9 Units Subcutaneous TID WC  . labetalol  300 mg Oral TID  . pantoprazole (PROTONIX) IV  40 mg Intravenous Q24H  . potassium chloride  20 mEq Oral Once  . potassium chloride  40 mEq Oral Once  . sodium chloride  3 mL Intravenous Q12H   ondansetron (ZOFRAN) IV, ondansetron, oxyCODONE  Assessment/ Plan:  1.Hypertensive emergency. This may be secondary to non adherence although patient does claim general compliance. Improved on new regimen 2.CKD 5 i anticipate dialysis will be required at some point and will need access and dialysis education  3. Anemia stable  4. Bones PTH drawn in  office 5. Discussed with Dr Moshe Cipro. Patient missed last visit. She shall see him at next scheduled appointment     LOS: 3 Joshua [name] @TODAY @9 :03 AM

## 2013-02-02 ENCOUNTER — Emergency Department (HOSPITAL_COMMUNITY): Payer: Medicaid Other

## 2013-02-02 ENCOUNTER — Inpatient Hospital Stay (HOSPITAL_COMMUNITY)
Admission: EM | Admit: 2013-02-02 | Discharge: 2013-02-07 | DRG: 682 | Disposition: A | Discharge status: Home / Self Care | Payer: Medicaid Other | Attending: Family Medicine | Admitting: Family Medicine

## 2013-02-02 ENCOUNTER — Encounter (HOSPITAL_COMMUNITY): Payer: Self-pay | Admitting: Emergency Medicine

## 2013-02-02 ENCOUNTER — Inpatient Hospital Stay (HOSPITAL_COMMUNITY): Payer: Medicaid Other

## 2013-02-02 DIAGNOSIS — R339 Retention of urine, unspecified: Secondary | ICD-10-CM

## 2013-02-02 DIAGNOSIS — I5043 Acute on chronic combined systolic (congestive) and diastolic (congestive) heart failure: Secondary | ICD-10-CM

## 2013-02-02 DIAGNOSIS — R112 Nausea with vomiting, unspecified: Secondary | ICD-10-CM | POA: Diagnosis present

## 2013-02-02 DIAGNOSIS — I429 Cardiomyopathy, unspecified: Secondary | ICD-10-CM | POA: Diagnosis present

## 2013-02-02 DIAGNOSIS — R040 Epistaxis: Secondary | ICD-10-CM | POA: Diagnosis present

## 2013-02-02 DIAGNOSIS — E1121 Type 2 diabetes mellitus with diabetic nephropathy: Secondary | ICD-10-CM

## 2013-02-02 DIAGNOSIS — R519 Headache, unspecified: Secondary | ICD-10-CM | POA: Diagnosis present

## 2013-02-02 DIAGNOSIS — E119 Type 2 diabetes mellitus without complications: Secondary | ICD-10-CM

## 2013-02-02 DIAGNOSIS — Z9119 Patient's noncompliance with other medical treatment and regimen: Secondary | ICD-10-CM

## 2013-02-02 DIAGNOSIS — Z87891 Personal history of nicotine dependence: Secondary | ICD-10-CM

## 2013-02-02 DIAGNOSIS — I120 Hypertensive chronic kidney disease with stage 5 chronic kidney disease or end stage renal disease: Principal | ICD-10-CM | POA: Diagnosis present

## 2013-02-02 DIAGNOSIS — R079 Chest pain, unspecified: Secondary | ICD-10-CM

## 2013-02-02 DIAGNOSIS — K8020 Calculus of gallbladder without cholecystitis without obstruction: Secondary | ICD-10-CM | POA: Diagnosis present

## 2013-02-02 DIAGNOSIS — D631 Anemia in chronic kidney disease: Secondary | ICD-10-CM | POA: Diagnosis present

## 2013-02-02 DIAGNOSIS — R51 Headache: Secondary | ICD-10-CM

## 2013-02-02 DIAGNOSIS — I5020 Unspecified systolic (congestive) heart failure: Secondary | ICD-10-CM | POA: Diagnosis present

## 2013-02-02 DIAGNOSIS — I161 Hypertensive emergency: Secondary | ICD-10-CM

## 2013-02-02 DIAGNOSIS — N189 Chronic kidney disease, unspecified: Secondary | ICD-10-CM

## 2013-02-02 DIAGNOSIS — I5033 Acute on chronic diastolic (congestive) heart failure: Secondary | ICD-10-CM | POA: Diagnosis present

## 2013-02-02 DIAGNOSIS — Z91199 Patient's noncompliance with other medical treatment and regimen due to unspecified reason: Secondary | ICD-10-CM

## 2013-02-02 DIAGNOSIS — N179 Acute kidney failure, unspecified: Secondary | ICD-10-CM | POA: Diagnosis present

## 2013-02-02 DIAGNOSIS — E860 Dehydration: Secondary | ICD-10-CM

## 2013-02-02 DIAGNOSIS — I160 Hypertensive urgency: Secondary | ICD-10-CM

## 2013-02-02 DIAGNOSIS — I4891 Unspecified atrial fibrillation: Secondary | ICD-10-CM | POA: Diagnosis present

## 2013-02-02 DIAGNOSIS — N186 End stage renal disease: Secondary | ICD-10-CM | POA: Diagnosis present

## 2013-02-02 DIAGNOSIS — Z79899 Other long term (current) drug therapy: Secondary | ICD-10-CM

## 2013-02-02 DIAGNOSIS — I10 Essential (primary) hypertension: Secondary | ICD-10-CM

## 2013-02-02 DIAGNOSIS — N184 Chronic kidney disease, stage 4 (severe): Secondary | ICD-10-CM

## 2013-02-02 DIAGNOSIS — R7989 Other specified abnormal findings of blood chemistry: Secondary | ICD-10-CM

## 2013-02-02 DIAGNOSIS — D72829 Elevated white blood cell count, unspecified: Secondary | ICD-10-CM

## 2013-02-02 DIAGNOSIS — I509 Heart failure, unspecified: Secondary | ICD-10-CM | POA: Diagnosis present

## 2013-02-02 LAB — CBC
HCT: 31.9 % — ABNORMAL LOW (ref 39.0–52.0)
MCV: 70.7 fL — ABNORMAL LOW (ref 78.0–100.0)
Platelets: 189 10*3/uL (ref 150–400)
RDW: 19.9 % — ABNORMAL HIGH (ref 11.5–15.5)
RDW: 20 % — ABNORMAL HIGH (ref 11.5–15.5)
WBC: 14.5 10*3/uL — ABNORMAL HIGH (ref 4.0–10.5)
WBC: 16.7 10*3/uL — ABNORMAL HIGH (ref 4.0–10.5)

## 2013-02-02 LAB — COMPREHENSIVE METABOLIC PANEL
ALT: 12 U/L (ref 0–53)
AST: 9 U/L (ref 0–37)
AST: 7 U/L (ref 0–37)
Albumin: 3.5 g/dL (ref 3.5–5.2)
Albumin: 2.9 g/dL — ABNORMAL LOW (ref 3.5–5.2)
Alkaline Phosphatase: 154 U/L — ABNORMAL HIGH (ref 39–117)
Chloride: 103 mEq/L (ref 96–112)
Creatinine, Ser: 3.4 mg/dL — ABNORMAL HIGH (ref 0.50–1.35)
Glucose, Bld: 123 mg/dL — ABNORMAL HIGH (ref 70–99)
Potassium: 3.6 mEq/L (ref 3.5–5.1)
Potassium: 3.7 mEq/L (ref 3.5–5.1)
Sodium: 136 mEq/L (ref 135–145)
Total Bilirubin: 0.8 mg/dL (ref 0.3–1.2)
Total Protein: 7.7 g/dL (ref 6.0–8.3)

## 2013-02-02 LAB — GLUCOSE, CAPILLARY: Glucose-Capillary: 172 mg/dL — ABNORMAL HIGH (ref 70–99)

## 2013-02-02 LAB — TROPONIN I: Troponin I: <0.3 ng/mL (ref <0.30)

## 2013-02-02 MED ORDER — ENOXAPARIN SODIUM 40 MG/0.4ML ~~LOC~~ SOLN
40.0000 mg | SUBCUTANEOUS | Status: DC
  Administered 2013-02-02 – 2013-02-05 (×4): 40 mg via SUBCUTANEOUS
  Filled 2013-02-02 (×5): qty 0.4

## 2013-02-02 MED ORDER — SODIUM CHLORIDE 0.9 % IV SOLN: 250.0000 mL | INTRAVENOUS | Status: DC | PRN

## 2013-02-02 MED ORDER — MORPHINE SULFATE 4 MG/ML IJ SOLN: 4.0000 mg | INTRAMUSCULAR | Status: DC | PRN

## 2013-02-02 MED ORDER — SODIUM CHLORIDE 0.9 % IV SOLN
1000.0000 mL | Freq: Once | INTRAVENOUS | Status: AC
  Administered 2013-02-02: 1000 mL via INTRAVENOUS

## 2013-02-02 MED ORDER — SODIUM CHLORIDE 0.9 % IV SOLN
1000.0000 mL | INTRAVENOUS | Status: DC
  Administered 2013-02-02 (×2): 1000 mL via INTRAVENOUS

## 2013-02-02 MED ORDER — GABAPENTIN 300 MG PO CAPS
300.0000 mg | ORAL_CAPSULE | Freq: Three times a day (TID) | ORAL | Status: DC
  Administered 2013-02-02 – 2013-02-07 (×16): 300 mg via ORAL
  Filled 2013-02-02 (×18): qty 1

## 2013-02-02 MED ORDER — SODIUM CHLORIDE 0.9 % IJ SOLN
3.0000 mL | Freq: Two times a day (BID) | INTRAMUSCULAR | Status: DC
  Administered 2013-02-03 – 2013-02-06 (×7): 3 mL via INTRAVENOUS

## 2013-02-02 MED ORDER — HYDRALAZINE HCL 50 MG PO TABS
50.0000 mg | ORAL_TABLET | Freq: Four times a day (QID) | ORAL | Status: DC
  Administered 2013-02-02 – 2013-02-06 (×16): 50 mg via ORAL
  Filled 2013-02-02 (×18): qty 1

## 2013-02-02 MED ORDER — FERROUS SULFATE 325 (65 FE) MG PO TABS
325.0000 mg | ORAL_TABLET | Freq: Two times a day (BID) | ORAL | Status: DC
  Administered 2013-02-02 – 2013-02-07 (×11): 325 mg via ORAL
  Filled 2013-02-02 (×12): qty 1

## 2013-02-02 MED ORDER — SODIUM CHLORIDE 0.9 % IJ SOLN: 3.0000 mL | INTRAMUSCULAR | Status: DC | PRN

## 2013-02-02 MED ORDER — AMLODIPINE BESYLATE 10 MG PO TABS
10.0000 mg | ORAL_TABLET | Freq: Every day | ORAL | Status: DC
  Filled 2013-02-02: qty 1

## 2013-02-02 MED ORDER — HYDRALAZINE HCL 25 MG PO TABS
25.0000 mg | ORAL_TABLET | Freq: Four times a day (QID) | ORAL | Status: DC
  Administered 2013-02-02 (×3): 25 mg via ORAL
  Filled 2013-02-02 (×5): qty 1

## 2013-02-02 MED ORDER — ONDANSETRON HCL 4 MG/2ML IJ SOLN
4.0000 mg | Freq: Once | INTRAMUSCULAR | Status: AC
  Administered 2013-02-02: 4 mg via INTRAVENOUS
  Filled 2013-02-02: qty 2

## 2013-02-02 MED ORDER — ASPIRIN 325 MG PO TABS
325.0000 mg | ORAL_TABLET | Freq: Every day | ORAL | Status: DC
  Administered 2013-02-02 – 2013-02-07 (×6): 325 mg via ORAL
  Filled 2013-02-02 (×6): qty 1

## 2013-02-02 MED ORDER — INSULIN ASPART 100 UNIT/ML ~~LOC~~ SOLN
0.0000 [IU] | Freq: Three times a day (TID) | SUBCUTANEOUS | Status: DC
  Administered 2013-02-02: 2 [IU] via SUBCUTANEOUS
  Administered 2013-02-02 – 2013-02-03 (×3): 1 [IU] via SUBCUTANEOUS
  Administered 2013-02-03: 2 [IU] via SUBCUTANEOUS
  Administered 2013-02-04 – 2013-02-07 (×9): 1 [IU] via SUBCUTANEOUS

## 2013-02-02 MED ORDER — ONDANSETRON HCL 4 MG PO TABS: 4.0000 mg | ORAL_TABLET | Freq: Four times a day (QID) | ORAL | Status: DC | PRN

## 2013-02-02 MED ORDER — HYDRALAZINE HCL 20 MG/ML IJ SOLN
INTRAMUSCULAR | Status: AC
  Filled 2013-02-02: qty 1

## 2013-02-02 MED ORDER — FUROSEMIDE 80 MG PO TABS
80.0000 mg | ORAL_TABLET | Freq: Two times a day (BID) | ORAL | Status: DC
  Administered 2013-02-02 – 2013-02-04 (×5): 80 mg via ORAL
  Filled 2013-02-02 (×7): qty 1

## 2013-02-02 MED ORDER — LABETALOL HCL 5 MG/ML IV SOLN
20.0000 mg | Freq: Once | INTRAVENOUS | Status: AC
  Administered 2013-02-02: 20 mg via INTRAVENOUS
  Filled 2013-02-02: qty 4

## 2013-02-02 MED ORDER — CLONIDINE HCL 0.3 MG PO TABS
0.3000 mg | ORAL_TABLET | Freq: Three times a day (TID) | ORAL | Status: DC
  Filled 2013-02-02 (×3): qty 1

## 2013-02-02 MED ORDER — LABETALOL HCL 300 MG PO TABS
300.0000 mg | ORAL_TABLET | Freq: Three times a day (TID) | ORAL | Status: DC
  Administered 2013-02-02 – 2013-02-04 (×8): 300 mg via ORAL
  Filled 2013-02-02 (×10): qty 1

## 2013-02-02 MED ORDER — NICARDIPINE HCL IN NACL 20-0.86 MG/200ML-% IV SOLN
5.0000 mg/h | INTRAVENOUS | Status: DC
  Administered 2013-02-03: 7 mg/h via INTRAVENOUS
  Administered 2013-02-03 (×2): 5 mg/h via INTRAVENOUS
  Administered 2013-02-03: 2.5 mg/h via INTRAVENOUS
  Administered 2013-02-03: 7 mg/h via INTRAVENOUS
  Administered 2013-02-04 – 2013-02-05 (×5): 5 mg/h via INTRAVENOUS
  Filled 2013-02-02 (×13): qty 200

## 2013-02-02 MED ORDER — MORPHINE SULFATE 4 MG/ML IJ SOLN
INTRAMUSCULAR | Status: AC
  Administered 2013-02-02: 4 mg
  Filled 2013-02-02: qty 1

## 2013-02-02 MED ORDER — NIFEDIPINE ER 30 MG PO TB24
30.0000 mg | ORAL_TABLET | Freq: Every day | ORAL | Status: DC
  Administered 2013-02-02: 30 mg via ORAL
  Filled 2013-02-02 (×2): qty 1

## 2013-02-02 MED ORDER — HYDROCODONE-ACETAMINOPHEN 5-325 MG PO TABS: 1.0000 | ORAL_TABLET | Freq: Four times a day (QID) | ORAL | Status: DC | PRN

## 2013-02-02 MED ORDER — NITROPRUSSIDE SODIUM 25 MG/ML IV SOLN
0.2500 ug/kg/min | INTRAVENOUS | Status: DC
  Administered 2013-02-02: 3 ug/kg/min via INTRAVENOUS
  Filled 2013-02-02 (×3): qty 2

## 2013-02-02 MED ORDER — HYDRALAZINE HCL 20 MG/ML IJ SOLN
10.0000 mg | Freq: Once | INTRAMUSCULAR | Status: AC
  Administered 2013-02-02: 10 mg via INTRAVENOUS

## 2013-02-02 MED ORDER — MORPHINE SULFATE 4 MG/ML IJ SOLN
6.0000 mg | Freq: Once | INTRAMUSCULAR | Status: AC
  Administered 2013-02-02: 6 mg via INTRAVENOUS
  Filled 2013-02-02: qty 2

## 2013-02-02 MED ORDER — ONDANSETRON HCL 4 MG/2ML IJ SOLN
4.0000 mg | Freq: Four times a day (QID) | INTRAMUSCULAR | Status: DC | PRN
  Administered 2013-02-02: 4 mg via INTRAVENOUS
  Filled 2013-02-02: qty 2

## 2013-02-02 MED ORDER — LABETALOL HCL 300 MG PO TABS
300.0000 mg | ORAL_TABLET | Freq: Three times a day (TID) | ORAL | Status: DC
  Filled 2013-02-02 (×3): qty 1

## 2013-02-02 MED ORDER — AMLODIPINE BESYLATE 10 MG PO TABS
10.0000 mg | ORAL_TABLET | Freq: Every day | ORAL | Status: DC
  Administered 2013-02-02: 10 mg via ORAL
  Filled 2013-02-02: qty 1

## 2013-02-02 MED ORDER — ISOSORBIDE MONONITRATE ER 30 MG PO TB24
30.0000 mg | ORAL_TABLET | Freq: Every day | ORAL | Status: DC
  Administered 2013-02-02 – 2013-02-07 (×6): 30 mg via ORAL
  Filled 2013-02-02 (×7): qty 1

## 2013-02-02 MED ORDER — CLONIDINE HCL 0.3 MG PO TABS
0.3000 mg | ORAL_TABLET | Freq: Three times a day (TID) | ORAL | Status: DC
  Administered 2013-02-02 – 2013-02-04 (×8): 0.3 mg via ORAL
  Filled 2013-02-02 (×10): qty 1

## 2013-02-02 MED ORDER — NITROPRUSSIDE SODIUM 25 MG/ML IV SOLN
0.2500 ug/kg/min | Freq: Once | INTRAVENOUS | Status: AC
  Administered 2013-02-02: 0.5 ug/kg/min via INTRAVENOUS
  Filled 2013-02-02 (×2): qty 2

## 2013-02-02 NOTE — H&P (Signed)
Chief Complaint:  headache  HPI: 32 yo male h/o esrd (not on dialysis yet), htn, dm, osa (cpap noncompliant), cardiomyopathy, comes into ED for n/v x 2 days.  He has had several bouts of n/v that last for days with associated abd pain.  Pt is poor historian.  Has not been able to keep meds down for 2 days.  Started having headache and his spb was very high at home, so came to ED.  Denies any fevers.  No diarrhea.  Pt was suppose to see nephrology last month to set up access for dialysis initiation in the future but he missed that appt and has not established with a nephrologist yet.  Denies abd pain now.  No n/v since in ED.  On nipride gtt with some improvement of his initial bp of 240/133.  Denies cp or sob.  Denies le edema or swelling.  Review of Systems:  Positive and negative as per HPI otherwise all other systems are negative  Past Medical History: Past Medical History  Diagnosis Date  . Arthritis   . CKD (chronic kidney disease) stage 4, GFR 15-29 ml/min   . Hypertension     Hypertensive urgency/emergency - no RAS, normal aldo/PRA ratio,high urine metanephrines and normetanephrines   . Atrial fibrillation   . Type II diabetes mellitus   . History of bronchitis   . Sleep apnea     No CPAP  . Headache(784.0)     Regular  . Anemia of chronic disease    Past Surgical History  Procedure Laterality Date  . No past surgeries      Medications: Prior to Admission medications   Medication Sig Start Date End Date Taking? Authorizing Provider  amLODipine (NORVASC) 10 MG tablet Take 1 tablet (10 mg total) by mouth daily. 01/26/13  Yes Hosie Poisson, MD  aspirin 325 MG tablet Take 325 mg by mouth daily.   Yes Historical Provider, MD  cloNIDine (CATAPRES) 0.3 MG tablet Take 1 tablet (0.3 mg total) by mouth 3 (three) times daily. 09/28/12  Yes Coral Spikes, DO  ferrous sulfate 325 (65 FE) MG tablet Take 325 mg by mouth 2 (two) times daily.   Yes Historical Provider, MD  furosemide (LASIX)  80 MG tablet Take 1 tablet (80 mg total) by mouth 2 (two) times daily. 01/26/13  Yes Hosie Poisson, MD  gabapentin (NEURONTIN) 100 MG capsule Take 3 capsules (300 mg total) by mouth 3 (three) times daily. 09/02/12  Yes Posey Boyer, MD  labetalol (NORMODYNE) 300 MG tablet Take 1 tablet (300 mg total) by mouth 3 (three) times daily. 01/26/13  Yes Hosie Poisson, MD    Allergies:  No Known Allergies  Social History:  reports that he quit smoking about 13 years ago. His smoking use included Cigarettes. He smoked 0.00 packs per day for .1 years. He has never used smokeless tobacco. He reports that he does not drink alcohol or use illicit drugs.  Family History: Family History  Problem Relation Age of Onset  . Diabetes Father   . Hypertension Father   . Hypertension Maternal Grandmother   . Hypertension Maternal Grandfather   . Kidney disease Maternal Grandfather   . Diabetes Maternal Grandfather     Physical Exam: Filed Vitals:   02/02/13 0330 02/02/13 0400 02/02/13 0445 02/02/13 0450  BP: 192/108 193/122 213/133 239/138  Pulse: 98 97 95 93  Temp:      TempSrc:      Resp: 33 26 21 22  Height:      Weight:      SpO2: 96% 93% 99% 97%   General appearance: alert, cooperative and no distress Head: Normocephalic, without obvious abnormality, atraumatic Eyes: negative Nose: Nares normal. Septum midline. Mucosa normal. No drainage or sinus tenderness. Neck: no JVD and supple, symmetrical, trachea midline Lungs: clear to auscultation bilaterally Heart: regular rate and rhythm, S1, S2 normal, no murmur, click, rub or gallop Abdomen: soft, non-tender; bowel sounds normal; no masses,  no organomegaly Extremities: extremities normal, atraumatic, no cyanosis or edema Pulses: 2+ and symmetric Skin: Skin color, texture, turgor normal. No rashes or lesions Neurologic: Grossly normal    Labs on Admission:   Recent Labs  02/02/13 0037  NA 136  K 3.6  CL 99  CO2 24  GLUCOSE 123*  BUN  36*  CREATININE 3.50*  CALCIUM 9.6    Recent Labs  02/02/13 0037  AST 9  ALT 12  ALKPHOS 154*  BILITOT 1.0  PROT 7.7  ALBUMIN 3.5    Recent Labs  02/02/13 0037  LIPASE 24    Recent Labs  02/02/13 0037  WBC 16.7*  HGB 10.1*  HCT 31.9*  MCV 70.1*  PLT 196    Recent Labs  02/02/13 0037  TROPONINI <0.30   Radiological Exams on Admission: Ct Head Wo Contrast  02/02/2013   *RADIOLOGY REPORT*  Clinical Data: Hypertension.  Headache.  Nausea and vomiting.  CT HEAD WITHOUT CONTRAST  Technique:  Contiguous axial images were obtained from the base of the skull through the vertex without contrast.  Comparison: 09/26/2012  Findings: Vague appear low attenuation change in the deep white matter of the posterior parietal regions bilaterally, similar to previous study.  Ventricles and sulci are symmetrical.  No mass effect or midline shift.  No abnormal extra-axial fluid collections.  Gray-white matter junctions are distinct.  Basal cisterns are not effaced.  No evidence of acute intracranial hemorrhage.  No depressed skull fractures.  Visualized paranasal sinuses and mastoid air cells are not opacified.  IMPRESSION: Vague posterior parietal white matter changes are stable since previous study.  No acute intracranial abnormalities demonstrated.   Original Report Authenticated By: Lucienne Capers, M.D.   US Abdomen Complete  02/02/2013   *RADIOLOGY REPORT*  Clinical Data:  Hypertension.  Abdominal pain.  Nausea and vomiting.  COMPLETE ABDOMINAL ULTRASOUND  Comparison:  01/24/2013  Findings:  Gallbladder:  Small stones layering in the dependent portion of the gallbladder.  Mild gallbladder wall thickening and 5.7 mm.  Unable to assess for Murphy's sign due to patient on morphine.  Stable appearance since previous study.  Common bile duct:  Not dilated.  Diameter measures 2.4 mm.  Liver:  Diffuse increased parenchymal echotexture suggesting fatty infiltration.  No focal lesions identified.   IVC:  Appears normal.  Pancreas:  Pancreas is not visualized due to overlying bowel gas.  Spleen:  Spleen length measures 13 cm.  Volume is calculated at 278 ml.  Splenic enlargement.  Right Kidney:  Right kidney measures 12 cm length.  No hydronephrosis.  Left Kidney:  Left kidney measures 10 cm length.  No hydronephrosis.  Abdominal aorta:  Visualized portions are nonaneurysmal.  IMPRESSION: Cholelithiasis with mild gallbladder wall thickening.  Findings are stable since previous study.  Mild diffuse fatty infiltration in the liver.  Splenic enlargement.   Original Report Authenticated By: Lucienne Capers, M.D.   Dg Chest Portable 1 View  02/02/2013   *RADIOLOGY REPORT*  Clinical Data: Hypertension.  PORTABLE CHEST -  1 VIEW  Comparison: 01/24/2013  Findings: Cardiac enlargement.  Increasing pulmonary vascular congestion since previous study.  No edema or focal consolidation is appreciated.  No blunting of costophrenic angles.  No pneumothorax.  IMPRESSION: Stable cardiac enlargement with interval development of mild pulmonary vascular congestion.   Original Report Authenticated By: Lucienne Capers, M.D.    Assessment/Plan 32 yo male with htn urgency, esrd, recurrent n/v/abd pain, cholelithiasis  Principal Problem:   Hypertensive emergency Active Problems:   Chronic kidney disease (CKD), stage IV (severe)   Nausea and vomiting   Anemia in chronic kidney disease   Secondary cardiomyopathy   HFrEF (heart failure with reduced ejection fraction)   Headache(784.0)   Noncompliance   Cholelithiases  Unsure if his bouts of n/v/abd pain are due to biliary colic, or possible gastroparesis.  Certainly at high risk for gastroparesis.  Unsure if his uncontrolled htn is due to symptoms or really medical noncompliance.  Cont nipride gtt in stepdown.  abd exam is benign.  Cr is at his baseline and not currently uremic, however suspect he will end up on dialysis within the next year.  Ssi.  Admit to stepdown.   Full code.  chf compensated at this time.  Dolphus Linch A 02/02/2013, 5:04 AM

## 2013-02-02 NOTE — ED Provider Notes (Signed)
CSN: [id number]     Arrival date & time 02/02/13  0008 History   First MD Initiated Contact with Patient 02/02/13 0033     Chief Complaint  Patient presents with  . Hypertension    HPI Patient presents with upper abdominal pain as well as nausea and vomiting over the past 2 days.  He's been unable to keep his blood pressure medicine at home and comes in with a severe headache and elevated blood pressure 233/154.  He reports his upper abdominal pain is intermittent and cramping.  His vomit his been nonbloody nonbilious.  He denies diarrhea.  No fevers or chills.  He has a known history of cholelithiasis.  He reports his abdomen is hurting in his mid upper abdomen.  Symptoms are mild to moderate in severity.     Past Medical History  Diagnosis Date  . Arthritis   . CKD (chronic kidney disease) stage 4, GFR 15-29 ml/min   . Hypertension     Hypertensive urgency/emergency - no RAS, normal aldo/PRA ratio,high urine metanephrines and normetanephrines   . Atrial fibrillation   . Type II diabetes mellitus   . History of bronchitis   . Sleep apnea     No CPAP  . Headache(784.0)     Regular  . Anemia of chronic disease    Past Surgical History  Procedure Laterality Date  . No past surgeries     Family History  Problem Relation Age of Onset  . Diabetes Father   . Hypertension Father   . Hypertension Maternal Grandmother   . Hypertension Maternal Grandfather   . Kidney disease Maternal Grandfather   . Diabetes Maternal Grandfather    History  Substance Use Topics  . Smoking status: Former Smoker -- .1 years    Types: Cigarettes    Quit date: 09/24/1999  . Smokeless tobacco: Never Used  . Alcohol Use: No    Review of Systems  All other systems reviewed and are negative.    Allergies  Review of patient's allergies indicates no known allergies.  Home Medications   Current Outpatient Rx  Name  Route  Sig  Dispense  Refill  . amLODipine (NORVASC) 10 MG tablet   Oral    Take 1 tablet (10 mg total) by mouth daily.   30 tablet   1   . aspirin 325 MG tablet   Oral   Take 325 mg by mouth daily.         . cloNIDine (CATAPRES) 0.3 MG tablet   Oral   Take 1 tablet (0.3 mg total) by mouth 3 (three) times daily.   60 tablet   3   . ferrous sulfate 325 (65 FE) MG tablet   Oral   Take 325 mg by mouth 2 (two) times daily.         . furosemide (LASIX) 80 MG tablet   Oral   Take 1 tablet (80 mg total) by mouth 2 (two) times daily.   60 tablet   0   . gabapentin (NEURONTIN) 100 MG capsule   Oral   Take 3 capsules (300 mg total) by mouth 3 (three) times daily.   90 capsule   3   . labetalol (NORMODYNE) 300 MG tablet   Oral   Take 1 tablet (300 mg total) by mouth 3 (three) times daily.   90 tablet   1    BP 196/116  Pulse 92  Temp(Src) 97.7 F (36.5 C) (Oral)  Resp 34  Ht 5' 11.5" (1.816 m)  Wt 250 lb (113.399 kg)  BMI 34.39 kg/m2  SpO2 93% Physical Exam  Nursing note and vitals reviewed. Constitutional: He is oriented to person, place, and time. He appears well-developed and well-nourished.  HENT:  Head: Normocephalic and atraumatic.  Eyes: EOM are normal.  Neck: Normal range of motion.  Cardiovascular: Normal rate, regular rhythm, normal heart sounds and intact distal pulses.   Pulmonary/Chest: Effort normal and breath sounds normal. No respiratory distress.  Abdominal: Soft. He exhibits no distension.  Mild epigastric tenderness without guarding or rebound.  Genitourinary: Rectum normal.  Musculoskeletal: Normal range of motion.  Neurological: He is alert and oriented to person, place, and time.  Skin: Skin is warm and dry.  Psychiatric: He has a normal mood and affect. Judgment normal.    ED Course  Procedures (including critical care time)  ECG interpretation   Date: 02/02/2013  Rate: 95  Rhythm: normal sinus rhythm  QRS Axis: normal  Intervals: normal  ST/T Wave abnormalities: Nonspecific T wave changes  Conduction  Disutrbances: none  Narrative Interpretation: LVH  Old EKG Reviewed: No significant changes noted     Labs Review Labs Reviewed  CBC - Abnormal; Notable for the following:    WBC 16.7 (*)    Hemoglobin 10.1 (*)    HCT 31.9 (*)    MCV 70.1 (*)    MCH 22.2 (*)    RDW 20.0 (*)    All other components within normal limits  COMPREHENSIVE METABOLIC PANEL - Abnormal; Notable for the following:    Glucose, Bld 123 (*)    BUN 36 (*)    Creatinine, Ser 3.50 (*)    Alkaline Phosphatase 154 (*)    GFR calc non Af Amer 22 (*)    GFR calc Af Amer 25 (*)    All other components within normal limits  TROPONIN I  LIPASE, BLOOD   Imaging Review Ct Head Wo Contrast  02/02/2013   *RADIOLOGY REPORT*  Clinical Data: Hypertension.  Headache.  Nausea and vomiting.  CT HEAD WITHOUT CONTRAST  Technique:  Contiguous axial images were obtained from the base of the skull through the vertex without contrast.  Comparison: 09/26/2012  Findings: Vague appear low attenuation change in the deep white matter of the posterior parietal regions bilaterally, similar to previous study.  Ventricles and sulci are symmetrical.  No mass effect or midline shift.  No abnormal extra-axial fluid collections.  Gray-white matter junctions are distinct.  Basal cisterns are not effaced.  No evidence of acute intracranial hemorrhage.  No depressed skull fractures.  Visualized paranasal sinuses and mastoid air cells are not opacified.  IMPRESSION: Vague posterior parietal white matter changes are stable since previous study.  No acute intracranial abnormalities demonstrated.   Original Report Authenticated By: Lucienne Capers, M.D.   US Abdomen Complete  02/02/2013   *RADIOLOGY REPORT*  Clinical Data:  Hypertension.  Abdominal pain.  Nausea and vomiting.  COMPLETE ABDOMINAL ULTRASOUND  Comparison:  01/24/2013  Findings:  Gallbladder:  Small stones layering in the dependent portion of the gallbladder.  Mild gallbladder wall thickening and  5.7 mm.  Unable to assess for Murphy's sign due to patient on morphine.  Stable appearance since previous study.  Common bile duct:  Not dilated.  Diameter measures 2.4 mm.  Liver:  Diffuse increased parenchymal echotexture suggesting fatty infiltration.  No focal lesions identified.  IVC:  Appears normal.  Pancreas:  Pancreas is not visualized due to overlying bowel gas.  Spleen:  Spleen length measures 13 cm.  Volume is calculated at 278 ml.  Splenic enlargement.  Right Kidney:  Right kidney measures 12 cm length.  No hydronephrosis.  Left Kidney:  Left kidney measures 10 cm length.  No hydronephrosis.  Abdominal aorta:  Visualized portions are nonaneurysmal.  IMPRESSION: Cholelithiasis with mild gallbladder wall thickening.  Findings are stable since previous study.  Mild diffuse fatty infiltration in the liver.  Splenic enlargement.   Original Report Authenticated By: Lucienne Capers, M.D.   I personally reviewed the imaging tests through PACS system I reviewed available ER/hospitalization records through the EMR   MDM   1. Hypertensive urgency   2. Nausea and vomiting    3.  Chronic renal sufficiency  Patient presents with severe elevated hypertension as well as severe headache at this time.  CT head without acute pathology.  Patient started on nitroprusside drip.  Patient is having nausea and vomiting and has a known history of cholelithiasis.  Repeat ultrasound demonstrates no signs of acute cholecystitis.  His pain and nausea feel better after medication here.  The patient is being hydrated in the emergency department.  Severe elevated hypertension secondary to inability to keep his blood pressure medicine down over the past 48 hours.  Chronic kidney disease at baseline.  No urinary symptoms   Hoy Morn, MD 02/02/13 [phone]

## 2013-02-02 NOTE — ED Notes (Signed)
Bed: WA23 Expected date:  Expected time:  Means of arrival:  Comments: 

## 2013-02-02 NOTE — Progress Notes (Signed)
CARE MANAGEMENT NOTE 02/02/2013  Patient:  PARSON, CARSON   Account Number:  [id number]  Date Initiated:  02/02/2013  Documentation initiated by:  Prabhnoor Ellenberger  Subjective/Objective Assessment:   hypertensive crisis     Action/Plan:   home when stable   Anticipated DC Date:  02/05/2013   Anticipated DC Plan:  HOME/SELF CARE  In-house referral  [facility]  NA      [facility] Choice  NA   Choice offered to / List presented to:  NA   DME arranged  NA      DME agency  NA     [place] arranged  NA      [place] agency  NA   Status of service:  In process, will continue to follow Medicare Important Message given?  NA - LOS <3 / Initial given by admissions (If response is "NO", the following Medicare IM given date fields will be blank) Date Medicare IM given:   Date Additional Medicare IM given:    Discharge Disposition:    Per UR Regulation:  Reviewed for med. necessity/level of care/duration of stay  If discussed at [place] of Stay Meetings, dates discussed:    Comments:  [id number]/Destenie Ingber Eldridge Dace, [place], [place] [phone] Chart Reviewed for discharge and hospital needs. Discharge needs at time of review:  None Review of patient progress due on [id number].

## 2013-02-02 NOTE — Plan of Care (Cosign Needed)
BP still poorly controlled (212/114) once Nipride weaned off so will increase PO Hydralazine from 25 to 50 QID with dose now and add Imdur 30 daily with dose now- pt has DD and likely will benefit from add'l afterload reduction.  UPDATE: 1153 pm- BP 208/125 despite above measures so have added Nicardipine gtt as adjunct to attempt to get BP to 170/95 or less  Erin Hearing, ANP

## 2013-02-02 NOTE — ED Notes (Signed)
Per MD ok to titrate nipride slowly.

## 2013-02-02 NOTE — Plan of Care (Cosign Needed)
Received call from RN to assist in clarifying Nipride order- noted pt not yet at max dose after MD orders reviewed. In an effort to attempt to wean Nipride changed oral anti-HTN meds to start now. H/P reviewed and no documented edma on exam although CXR mild increase in edema so did not change current Lasix dose. Also ordered 1x dose of IV Apresoline. Also clarified Nipride requires ICU level of care so admission status changed.  Erin Hearing, ANP

## 2013-02-02 NOTE — ED Notes (Signed)
Per EMS, called to home for "vomiting blood".  Pt was nauseated and vomited after strawberry yogurt.  Pt states nausea is causing him to not be able to keep meds for two days.  Pt also states he is having abdominal pain and nose bleeds.  Pt is hypertensive on arrival.  Vitals:  232/166, hr 98, 20 resp, 95% ra,

## 2013-02-02 NOTE — Progress Notes (Addendum)
Subjective: Patient this morning hypertensive urgency and headache. Patient was started on nitroprusside drip which is being weaned off. Blood pressure is still elevated. He got all his scheduled medications at 6 AM. Filed Vitals:   02/02/13 1050  BP: 178/114  Pulse: 85  Temp:   Resp: 27    Chest: Clear Bilaterally Heart : S1S2 RRR Abdomen: Soft, nontender Ext : No edema Neuro: Alert, oriented x 3  A/P Hypertensive urgency Will add hydralazine 25 mg by mouth every 6 hours Continue nitroprusside drip Wean  off as BP allows  Hypertension Continue amlodipine, Catapres, labetalol as scheduled  Diabetes mellitus Continue sliding scale insulin  Nausea vomiting Resolved at this time  Headache Secondary to hypertensive urgency Improved after he received morphine  Abdominal pain Resolved at this time    [facility]ist Pager- [phone]

## 2013-02-02 NOTE — ED Notes (Signed)
Bed: [id number] Expected date:  Expected time:  Means of arrival:  Comments: EMS, abd pain N/V, HTN

## 2013-02-03 ENCOUNTER — Ambulatory Visit (HOSPITAL_COMMUNITY): Payer: Medicaid Other

## 2013-02-03 LAB — GLUCOSE, CAPILLARY
Glucose-Capillary: 107 mg/dL — ABNORMAL HIGH (ref 70–99)
Glucose-Capillary: 131 mg/dL — ABNORMAL HIGH (ref 70–99)
Glucose-Capillary: 141 mg/dL — ABNORMAL HIGH (ref 70–99)
Glucose-Capillary: 151 mg/dL — ABNORMAL HIGH (ref 70–99)
Glucose-Capillary: 163 mg/dL — ABNORMAL HIGH (ref 70–99)

## 2013-02-03 MED ORDER — OXYMETAZOLINE HCL 0.05 % NA SOLN
1.0000 | Freq: Two times a day (BID) | NASAL | Status: AC
  Administered 2013-02-03 – 2013-02-04 (×4): 1 via NASAL
  Filled 2013-02-03: qty 15

## 2013-02-03 MED ORDER — NIFEDIPINE ER 60 MG PO TB24
60.0000 mg | ORAL_TABLET | Freq: Every day | ORAL | Status: DC
  Administered 2013-02-03 – 2013-02-04 (×2): 60 mg via ORAL
  Filled 2013-02-03 (×3): qty 1

## 2013-02-03 NOTE — Progress Notes (Addendum)
[facility] PROGRESS NOTE  Joshua [name] [id number] DOB: 1980/10/21 DOA: 02/02/2013 PCP: Provider Not In System  Interval history 32 yo male h/o esrd (not on dialysis yet), htn, dm, osa (cpap noncompliant), cardiomyopathy, comes into ED for n/v x 2 days. He has had several bouts of n/v that last for days with associated abd pain. Pt is poor historian. Has not been able to keep meds down for 2 days. Started having headache and his spb was very high at home, so came to ED. Denies any fevers. No diarrhea. Pt was suppose to see nephrology last month to set up access for dialysis initiation in the future but he missed that appt and has not established with a nephrologist yet. Denies abd pain now. No n/v since in ED. On nipride gtt with some improvement of his initial bp of 240/133. Denies cp or sob. Denies le edema or swelling. The patient was started on nitroprusside drip, which was weaned off after his home medications were started. Last night patient blood pressure was again elevated requiring nicardipine drip, today he was also started on Procardia XL 60 mg by mouth daily. Dose of hydralazine was increased to 50 mg 4 times a day.   Assessment/Plan:  Hypertensive urgency  Patient is on multiple antihypertensive medications And his blood pressure finally seems to be under control  Continue to wean off nitroprusside drip   Hypertension  Continue  Catapres, labetalol as scheduled  Also started on nifedipine XL , hydralazine, Imdur  Diabetes mellitus  Continue sliding scale insulin   Nausea vomiting  Resolved at this time   Headache  Secondary to hypertensive urgency  Improved after he received morphine   Abdominal pain  Resolved at this time  Epistaxis Resolved Will start Afrin nasal drops for 4 doses  Acute on chronic kidney disease Creatinine is down to 3.40 which is around his baseline Patient did not follow up with nephrology as scheduled He sees he is to rescheduled  the appointment  Acute on chronic diastolic heart failure Continue Lasix  Code Status: Full code Family Communication: *Discussed with patient in detail Disposition Plan: Home when stable   Consultants:  None  Procedures:  None  Antibiotics:  None  HPI/Subjective: Patient seen and examined denies chest pain, says headache is better. Currently on nicardipine drip. Patient developed nosebleed last night, which is almost resolved.  Objective: Filed Vitals:   02/03/13 1500  BP: 153/90  Pulse: 82  Temp:   Resp: 31    Intake/Output Summary (Last 24 hours) at 02/03/13 1551 Last data filed at 02/03/13 1500  Gross per 24 hour  Intake 846.34 ml  Output   5175 ml  Net -4328.66 ml   Filed Weights   02/02/13 0026 02/02/13 0430 02/03/13 0400  Weight: 113.399 kg (250 lb) 124.4 kg (274 lb 4 oz) 122.3 kg (269 lb 10 oz)    Exam:   General:  Appears in no acute distress  Cardiovascular: S1-S2 is regular  Respiratory: *Clear bilaterally  Abdomen: *Soft nontender no organomegaly  Musculoskeletal: *No edema of the lower extremities  Data Reviewed: Basic Metabolic Panel:  Recent Labs Lab 02/02/13 0037 02/02/13 0525  NA 136 137  K 3.6 3.7  CL 99 103  CO2 24 25  GLUCOSE 123* 149*  BUN 36* 35*  CREATININE 3.50* 3.40*  CALCIUM 9.6 8.8   Liver Function Tests:  Recent Labs Lab 02/02/13 0037 02/02/13 0525  AST 9 7  ALT 12 9  ALKPHOS 154* 132*  BILITOT 1.0 0.8  PROT 7.7 6.5  ALBUMIN 3.5 2.9*    Recent Labs Lab 02/02/13 0037  LIPASE 24   No results found for this basename: AMMONIA,  in the last 168 hours CBC:  Recent Labs Lab 02/02/13 0037 02/02/13 0525  WBC 16.7* 14.5*  HGB 10.1* 9.4*  HCT 31.9* 29.7*  MCV 70.1* 70.7*  PLT 196 189   Cardiac Enzymes:  Recent Labs Lab 02/02/13 0037  TROPONINI <0.30   BNP (last 3 results)  Recent Labs  01/06/13 1847 01/24/13 0720  PROBNP 61864.0* >70000.0*   CBG:  Recent Labs Lab  02/02/13 1223 02/02/13 1600 02/02/13 2215 02/03/13 0746 02/03/13 1140  GLUCAP 131* 106* 107* 131* 141*    Recent Results (from the past 240 hour(s))  MRSA PCR SCREENING     Status: None   Collection Time    02/02/13  4:58 AM      Result Value Range Status   MRSA by PCR NEGATIVE  NEGATIVE Final   Comment:            The GeneXpert MRSA Assay (FDA     approved for NASAL specimens     only), is one component of a     comprehensive MRSA colonization     surveillance program. It is not     intended to diagnose MRSA     infection nor to guide or     monitor treatment for     MRSA infections.     Studies: Ct Head Wo Contrast  02/02/2013   *RADIOLOGY REPORT*  Clinical Data: Hypertension.  Headache.  Nausea and vomiting.  CT HEAD WITHOUT CONTRAST  Technique:  Contiguous axial images were obtained from the base of the skull through the vertex without contrast.  Comparison: 09/26/2012  Findings: Vague appear low attenuation change in the deep white matter of the posterior parietal regions bilaterally, similar to previous study.  Ventricles and sulci are symmetrical.  No mass effect or midline shift.  No abnormal extra-axial fluid collections.  Gray-white matter junctions are distinct.  Basal cisterns are not effaced.  No evidence of acute intracranial hemorrhage.  No depressed skull fractures.  Visualized paranasal sinuses and mastoid air cells are not opacified.  IMPRESSION: Vague posterior parietal white matter changes are stable since previous study.  No acute intracranial abnormalities demonstrated.   Original Report Authenticated By: Lucienne Capers, M.D.   US Abdomen Complete  02/02/2013   *RADIOLOGY REPORT*  Clinical Data:  Hypertension.  Abdominal pain.  Nausea and vomiting.  COMPLETE ABDOMINAL ULTRASOUND  Comparison:  01/24/2013  Findings:  Gallbladder:  Small stones layering in the dependent portion of the gallbladder.  Mild gallbladder wall thickening and 5.7 mm.  Unable to assess for  Murphy's sign due to patient on morphine.  Stable appearance since previous study.  Common bile duct:  Not dilated.  Diameter measures 2.4 mm.  Liver:  Diffuse increased parenchymal echotexture suggesting fatty infiltration.  No focal lesions identified.  IVC:  Appears normal.  Pancreas:  Pancreas is not visualized due to overlying bowel gas.  Spleen:  Spleen length measures 13 cm.  Volume is calculated at 278 ml.  Splenic enlargement.  Right Kidney:  Right kidney measures 12 cm length.  No hydronephrosis.  Left Kidney:  Left kidney measures 10 cm length.  No hydronephrosis.  Abdominal aorta:  Visualized portions are nonaneurysmal.  IMPRESSION: Cholelithiasis with mild gallbladder wall thickening.  Findings are stable since previous study.  Mild diffuse fatty infiltration in  the liver.  Splenic enlargement.   Original Report Authenticated By: Lucienne Capers, M.D.   Dg Chest Portable 1 View  02/02/2013   *RADIOLOGY REPORT*  Clinical Data: Hypertension.  PORTABLE CHEST - 1 VIEW  Comparison: 01/24/2013  Findings: Cardiac enlargement.  Increasing pulmonary vascular congestion since previous study.  No edema or focal consolidation is appreciated.  No blunting of costophrenic angles.  No pneumothorax.  IMPRESSION: Stable cardiac enlargement with interval development of mild pulmonary vascular congestion.   Original Report Authenticated By: Lucienne Capers, M.D.    Scheduled Meds: . aspirin  325 mg Oral Daily  . cloNIDine  0.3 mg Oral TID  . enoxaparin (LOVENOX) injection  40 mg Subcutaneous Q24H  . ferrous sulfate  325 mg Oral BID  . furosemide  80 mg Oral BID  . gabapentin  300 mg Oral TID  . hydrALAZINE  50 mg Oral QID  . insulin aspart  0-9 Units Subcutaneous TID WC  . isosorbide mononitrate  30 mg Oral Daily  . labetalol  300 mg Oral TID  . NIFEdipine  60 mg Oral Daily  . oxymetazoline  1 spray Each Nare BID  . sodium chloride  3 mL Intravenous Q12H   Continuous Infusions: . niCARDipine 2.5  mg/hr (02/03/13 1500)  . nitroPRUSSide 0.5 mcg/kg/min (02/02/13 1900)    Principal Problem:   Hypertensive emergency Active Problems:   Chronic kidney disease (CKD), stage IV (severe)   Nausea and vomiting   Anemia in chronic kidney disease   Secondary cardiomyopathy   HFrEF (heart failure with reduced ejection fraction)   Headache(784.0)   Noncompliance   Cholelithiases    Time spent: *35 minutes    [facility] Pager [phone]. If 7PM-7AM, please contact night-coverage at [email or web address], password [facility] 02/03/2013, 3:51 PM  LOS: 1 day

## 2013-02-04 DIAGNOSIS — E119 Type 2 diabetes mellitus without complications: Secondary | ICD-10-CM

## 2013-02-04 DIAGNOSIS — I5033 Acute on chronic diastolic (congestive) heart failure: Secondary | ICD-10-CM

## 2013-02-04 DIAGNOSIS — I10 Essential (primary) hypertension: Secondary | ICD-10-CM

## 2013-02-04 DIAGNOSIS — N189 Chronic kidney disease, unspecified: Secondary | ICD-10-CM

## 2013-02-04 LAB — GLUCOSE, CAPILLARY
Glucose-Capillary: 122 mg/dL — ABNORMAL HIGH (ref 70–99)
Glucose-Capillary: 128 mg/dL — ABNORMAL HIGH (ref 70–99)

## 2013-02-04 LAB — CBC
MCH: 22.5 pg — ABNORMAL LOW (ref 26.0–34.0)
MCV: 69.9 fL — ABNORMAL LOW (ref 78.0–100.0)
Platelets: 250 10*3/uL (ref 150–400)
RBC: 3.95 MIL/uL — ABNORMAL LOW (ref 4.22–5.81)
RDW: 20 % — ABNORMAL HIGH (ref 11.5–15.5)
WBC: 15.4 10*3/uL — ABNORMAL HIGH (ref 4.0–10.5)

## 2013-02-04 MED ORDER — DARBEPOETIN ALFA-POLYSORBATE 60 MCG/0.3ML IJ SOLN
60.0000 ug | Freq: Once | INTRAMUSCULAR | Status: AC
  Administered 2013-02-04: 60 ug via SUBCUTANEOUS
  Filled 2013-02-04: qty 0.3

## 2013-02-04 MED ORDER — FUROSEMIDE 10 MG/ML IJ SOLN
120.0000 mg | Freq: Three times a day (TID) | INTRAVENOUS | Status: DC
  Administered 2013-02-04 – 2013-02-05 (×2): 120 mg via INTRAVENOUS
  Filled 2013-02-04 (×3): qty 12

## 2013-02-04 MED ORDER — CLONIDINE HCL 0.3 MG PO TABS
0.3000 mg | ORAL_TABLET | Freq: Two times a day (BID) | ORAL | Status: DC
  Administered 2013-02-04 – 2013-02-07 (×6): 0.3 mg via ORAL
  Filled 2013-02-04 (×7): qty 1

## 2013-02-04 MED ORDER — LABETALOL HCL 200 MG PO TABS
400.0000 mg | ORAL_TABLET | Freq: Two times a day (BID) | ORAL | Status: DC
  Administered 2013-02-04 – 2013-02-05 (×2): 400 mg via ORAL
  Filled 2013-02-04 (×4): qty 2

## 2013-02-04 NOTE — Progress Notes (Signed)
[facility] PROGRESS NOTE  Joshua [name] [id number] DOB: 03-Jul-1980 DOA: 02/02/2013 PCP: Provider Not In System  Interval history 32 yo male h/o esrd (not on dialysis yet), htn, dm, osa (cpap noncompliant), cardiomyopathy, comes into ED for n/v x 2 days. He has had several bouts of n/v that last for days with associated abd pain. Pt is poor historian. Has not been able to keep meds down for 2 days. Started having headache and his spb was very high at home, so came to ED. Denies any fevers. No diarrhea. Pt was suppose to see nephrology last month to set up access for dialysis initiation in the future but he missed that appt and has not established with a nephrologist yet. Denies abd pain now. No n/v since in ED. On nipride gtt with some improvement of his initial bp of 240/133. Denies cp or sob. Denies le edema or swelling. The patient was started on nitroprusside drip, which was weaned off after his home medications were started. Last night patient blood pressure was again elevated requiring nicardipine drip, today he was also started on Procardia XL 60 mg by mouth daily. Dose of hydralazine was increased to 50 mg 4 times a day.   Assessment/Plan:  Hypertensive urgency  Patient is on multiple antihypertensive medications And his blood pressure  seems to be under control  Continue to wean off nicardipine drip  Hypertension  Continue  Catapres, labetalol as scheduled  Also started on nifedipine XL , hydralazine, Imdur  Diabetes mellitus  Continue sliding scale insulin   Nausea vomiting  Resolved at this time   Headache  Secondary to hypertensive urgency  Improved after he received morphine   Abdominal pain  Resolved at this time  Epistaxis Resolved Will start Afrin nasal drops for 4 doses  Acute on chronic kidney disease Creatinine is down to 3.40 which is around his baseline Patient did not follow up with nephrology as scheduled Consult nephrology  Acute on chronic  diastolic heart failure Continue Lasix  Code Status: Full code Family Communication: *Discussed with patient in detail Disposition Plan: Home when stable   Consultants:  None  Procedures:  None  Antibiotics:  None  HPI/Subjective: Patient seen and examined denies chest pain, says headache is better. Currently on nicardipine drip, pressure remains labile despite being on multiple antihypertensive medications   Objective: Filed Vitals:   02/04/13 1200  BP: 150/97  Pulse: 78  Temp:   Resp: 26    Intake/Output Summary (Last 24 hours) at 02/04/13 1257 Last data filed at 02/04/13 1200  Gross per 24 hour  Intake    280 ml  Output   2900 ml  Net  -2620 ml   Filed Weights   02/02/13 0026 02/02/13 0430 02/03/13 0400  Weight: 113.399 kg (250 lb) 124.4 kg (274 lb 4 oz) 122.3 kg (269 lb 10 oz)    Exam:   General:  Appears in no acute distress  Cardiovascular: S1-S2 is regular  Respiratory: *Clear bilaterally  Abdomen: *Soft nontender no organomegaly  Musculoskeletal: *No edema of the lower extremities  Data Reviewed: Basic Metabolic Panel:  Recent Labs Lab 02/02/13 0037 02/02/13 0525  NA 136 137  K 3.6 3.7  CL 99 103  CO2 24 25  GLUCOSE 123* 149*  BUN 36* 35*  CREATININE 3.50* 3.40*  CALCIUM 9.6 8.8   Liver Function Tests:  Recent Labs Lab 02/02/13 0037 02/02/13 0525  AST 9 7  ALT 12 9  ALKPHOS 154* 132*  BILITOT 1.0  0.8  PROT 7.7 6.5  ALBUMIN 3.5 2.9*    Recent Labs Lab 02/02/13 0037  LIPASE 24   No results found for this basename: AMMONIA,  in the last 168 hours CBC:  Recent Labs Lab 02/02/13 0037 02/02/13 0525 02/04/13 0745  WBC 16.7* 14.5* 15.4*  HGB 10.1* 9.4* 8.9*  HCT 31.9* 29.7* 27.6*  MCV 70.1* 70.7* 69.9*  PLT 196 189 250   Cardiac Enzymes:  Recent Labs Lab 02/02/13 0037  TROPONINI <0.30   BNP (last 3 results)  Recent Labs  01/06/13 1847 01/24/13 0720  PROBNP 61864.0* >70000.0*   CBG:  Recent  Labs Lab 02/03/13 0746 02/03/13 1140 02/03/13 1656 02/03/13 2107 02/04/13 0803  GLUCAP 131* 141* 151* 163* 122*    Recent Results (from the past 240 hour(s))  MRSA PCR SCREENING     Status: None   Collection Time    02/02/13  4:58 AM      Result Value Range Status   MRSA by PCR NEGATIVE  NEGATIVE Final   Comment:            The GeneXpert MRSA Assay (FDA     approved for NASAL specimens     only), is one component of a     comprehensive MRSA colonization     surveillance program. It is not     intended to diagnose MRSA     infection nor to guide or     monitor treatment for     MRSA infections.     Studies: No results found.  Scheduled Meds: . aspirin  325 mg Oral Daily  . cloNIDine  0.3 mg Oral TID  . enoxaparin (LOVENOX) injection  40 mg Subcutaneous Q24H  . ferrous sulfate  325 mg Oral BID  . furosemide  80 mg Oral BID  . gabapentin  300 mg Oral TID  . hydrALAZINE  50 mg Oral QID  . insulin aspart  0-9 Units Subcutaneous TID WC  . isosorbide mononitrate  30 mg Oral Daily  . labetalol  300 mg Oral TID  . NIFEdipine  60 mg Oral Daily  . oxymetazoline  1 spray Each Nare BID  . sodium chloride  3 mL Intravenous Q12H   Continuous Infusions: . niCARDipine 5 mg/hr (02/04/13 1131)    Principal Problem:   Hypertensive emergency Active Problems:   Chronic kidney disease (CKD), stage IV (severe)   Nausea and vomiting   Anemia in chronic kidney disease   Secondary cardiomyopathy   HFrEF (heart failure with reduced ejection fraction)   Headache(784.0)   Noncompliance   Cholelithiases    Time spent: *35 minutes    [facility] Pager [phone]. If 7PM-7AM, please contact night-coverage at [email or web address], password [facility] 02/04/2013, 12:57 PM  LOS: 2 days

## 2013-02-04 NOTE — Consult Note (Signed)
Renal Service Consult Note [facility]  Joshua [name] 02/04/2013 [place] Requesting Physician:  Dr Darrick Meigs  Reason for Consult:  CKD patient with uncontrolled BP HPI: The patient is a 32 y.o. year-old with hx of DM, HTN, afib and advanced CKD with baseline creat 3.5-4.0 presented with N/V for two days.  Hx cardiomyopathy as well. Unable to keep BP meds down.  Says he has money to buy meds but is going to apply for disabillity and medicaid as soon as he can see Dr Moshe Cipro in the office.  Pt is on nipride drip still, net negative 3L since admit on po lasix 80 bid. No sob or CP. Denies drug use, or etoh or nsaids    ROS  no HA,   no confusion  no skin rash   no abd pain  no diarrhea  no sob  Past Medical History  Past Medical History  Diagnosis Date  . Arthritis   . CKD (chronic kidney disease) stage 4, GFR 15-29 ml/min   . Hypertension     Hypertensive urgency/emergency - no RAS, normal aldo/PRA ratio,high urine metanephrines and normetanephrines   . Atrial fibrillation   . Type II diabetes mellitus   . History of bronchitis   . Sleep apnea     No CPAP  . Headache(784.0)     Regular  . Anemia of chronic disease    Past Surgical History  Past Surgical History  Procedure Laterality Date  . No past surgeries     Family History  Family History  Problem Relation Age of Onset  . Diabetes Father   . Hypertension Father   . Hypertension Maternal Grandmother   . Hypertension Maternal Grandfather   . Kidney disease Maternal Grandfather   . Diabetes Maternal Grandfather    Social History  reports that he quit smoking about 13 years ago. His smoking use included Cigarettes. He smoked 0.00 packs per day for .1 years. He has never used smokeless tobacco. He reports that he does not drink alcohol or use illicit drugs. Allergies No Known Allergies Home medications Prior to Admission medications   Medication Sig Start Date End Date Taking? Authorizing  Provider  amLODipine (NORVASC) 10 MG tablet Take 1 tablet (10 mg total) by mouth daily. 01/26/13  Yes Hosie Poisson, MD  aspirin 325 MG tablet Take 325 mg by mouth daily.   Yes Historical Provider, MD  cloNIDine (CATAPRES) 0.3 MG tablet Take 1 tablet (0.3 mg total) by mouth 3 (three) times daily. 09/28/12  Yes Coral Spikes, DO  ferrous sulfate 325 (65 FE) MG tablet Take 325 mg by mouth 2 (two) times daily.   Yes Historical Provider, MD  furosemide (LASIX) 80 MG tablet Take 1 tablet (80 mg total) by mouth 2 (two) times daily. 01/26/13  Yes Hosie Poisson, MD  gabapentin (NEURONTIN) 100 MG capsule Take 3 capsules (300 mg total) by mouth 3 (three) times daily. 09/02/12  Yes Posey Boyer, MD  labetalol (NORMODYNE) 300 MG tablet Take 1 tablet (300 mg total) by mouth 3 (three) times daily. 01/26/13  Yes Hosie Poisson, MD   Liver Function Tests  Recent Labs Lab 02/02/13 0037 02/02/13 0525  AST 9 7  ALT 12 9  ALKPHOS 154* 132*  BILITOT 1.0 0.8  PROT 7.7 6.5  ALBUMIN 3.5 2.9*    Recent Labs Lab 02/02/13 0037  LIPASE 24   CBC  Recent Labs Lab 02/02/13 0037 02/02/13 0525 02/04/13 0745  WBC 16.7* 14.5* 15.4*  HGB 10.1* 9.4* 8.9*  HCT 31.9* 29.7* 27.6*  MCV 70.1* 70.7* 69.9*  PLT 196 189 [id number]   Basic Metabolic Panel  Recent Labs Lab 02/02/13 0037 02/02/13 0525  NA 136 137  K 3.6 3.7  CL 99 103  CO2 24 25  GLUCOSE 123* 149*  BUN 36* 35*  CREATININE 3.50* 3.40*  CALCIUM 9.6 8.8    Physical Exam  Blood pressure 150/97, pulse 78, temperature 98.4 F (36.9 C), temperature source Oral, resp. rate 26, height 5\' 11"  (1.803 m), weight 122.3 kg (269 lb 10 oz), SpO2 96.00%. Gen: obese, alert young AAM in no distress Skin: no rash, cyanosis HEENT:  EOMI, sclera anicteric, throat clear Neck: + JVD, no LAN Chest: clear bilat, no rales or wheezing CV: regular, no rub or M, pedal pulses intact Abdomen: soft, obese, protuberant, no ascites, liver down 4 cm Ext: 2+ pedal edema bilat, no joint  effusion, no gangrene/ulcers Neuro: alert, Ox3, nonfocal   Assessment: 1. HTN urgency- change lasix to IV, diurese, adjusted BP meds also 2. CKD IV- eGFR around 37ml /min 3. Nausea/vomiting- doubt he is uremic with a eGFR of 25, maybe due to uncontrolled BP, ?gastroparesis 4. DM2 5. Cardiomyopathy EF 30%, prob due to HTN 6. Anemia of CKD, Hb 8-9, check Fe, give darbe x 1 100 ug  Plan- Diurese w IV lasix, adjusted BP meds, check Fe, epo, ?access if still here next week, wean cardene to keep DBP under 105  Kelly Splinter  MD Pager [phone]    Cell  [phone] 02/04/2013, 2:00 PM

## 2013-02-05 DIAGNOSIS — I5043 Acute on chronic combined systolic (congestive) and diastolic (congestive) heart failure: Secondary | ICD-10-CM

## 2013-02-05 DIAGNOSIS — I509 Heart failure, unspecified: Secondary | ICD-10-CM

## 2013-02-05 DIAGNOSIS — I10 Essential (primary) hypertension: Secondary | ICD-10-CM

## 2013-02-05 LAB — IRON AND TIBC
Iron: 25 ug/dL — ABNORMAL LOW (ref 42–135)
TIBC: 310 ug/dL (ref 215–435)
UIBC: 285 ug/dL (ref 125–400)

## 2013-02-05 LAB — BASIC METABOLIC PANEL
BUN: 52 mg/dL — ABNORMAL HIGH (ref 6–23)
Chloride: 99 mEq/L (ref 96–112)
Creatinine, Ser: 4.48 mg/dL — ABNORMAL HIGH (ref 0.50–1.35)
Glucose, Bld: 167 mg/dL — ABNORMAL HIGH (ref 70–99)
Potassium: 3.7 mEq/L (ref 3.5–5.1)

## 2013-02-05 LAB — GLUCOSE, CAPILLARY
Glucose-Capillary: 144 mg/dL — ABNORMAL HIGH (ref 70–99)
Glucose-Capillary: 158 mg/dL — ABNORMAL HIGH (ref 70–99)

## 2013-02-05 MED ORDER — LABETALOL HCL 200 MG PO TABS
400.0000 mg | ORAL_TABLET | Freq: Two times a day (BID) | ORAL | Status: DC
  Administered 2013-02-05 – 2013-02-06 (×2): 400 mg via ORAL
  Filled 2013-02-05 (×3): qty 2

## 2013-02-05 MED ORDER — NIFEDIPINE ER OSMOTIC RELEASE 90 MG PO TB24
90.0000 mg | ORAL_TABLET | Freq: Every day | ORAL | Status: DC
  Administered 2013-02-05: 90 mg via ORAL
  Filled 2013-02-05: qty 1

## 2013-02-05 MED ORDER — NIFEDIPINE ER OSMOTIC RELEASE 90 MG PO TB24
90.0000 mg | ORAL_TABLET | Freq: Every day | ORAL | Status: DC
  Administered 2013-02-06 – 2013-02-07 (×2): 90 mg via ORAL
  Filled 2013-02-05 (×2): qty 1

## 2013-02-05 MED ORDER — LISINOPRIL 5 MG PO TABS
5.0000 mg | ORAL_TABLET | Freq: Two times a day (BID) | ORAL | Status: DC
  Administered 2013-02-05 – 2013-02-07 (×5): 5 mg via ORAL
  Filled 2013-02-05 (×6): qty 1

## 2013-02-05 MED ORDER — FUROSEMIDE 10 MG/ML IJ SOLN
120.0000 mg | Freq: Two times a day (BID) | INTRAVENOUS | Status: DC
  Administered 2013-02-05 – 2013-02-06 (×3): 120 mg via INTRAVENOUS
  Filled 2013-02-05 (×3): qty 12

## 2013-02-05 NOTE — Progress Notes (Signed)
[facility] PROGRESS NOTE  Joshua [name] [id number] DOB: [birth year] DOA: 02/02/2013 PCP: Provider Not In System  Interval history 32 yo male h/o esrd (not on dialysis yet), htn, dm, osa (cpap noncompliant), cardiomyopathy, comes into ED for n/v x 2 days. He has had several bouts of n/v that last for days with associated abd pain. Pt is poor historian. Has not been able to keep meds down for 2 days. Started having headache and his spb was very high at home, so came to ED. Denies any fevers. No diarrhea. Pt was suppose to see nephrology last month to set up access for dialysis initiation in the future but he missed that appt and has not established with a nephrologist yet. Denies abd pain now. No n/v since in ED. On nipride gtt with some improvement of his initial bp of 240/133. Denies cp or sob. Denies le edema or swelling. The patient was started on nitroprusside drip, which was weaned off after his home medications were started. Last night patient blood pressure was again elevated requiring nicardipine drip, today he was also started on Procardia XL 60 mg by mouth daily. Dose of hydralazine was increased to 50 mg 4 times a day.   Assessment/Plan:  Hypertensive urgency  Patient is on multiple antihypertensive medications And his blood pressure  seems to be under control  Continue to wean off nicardipine drip  Hypertension  Continue  Catapres, labetalol as scheduled  Also started on nifedipine XL , hydralazine, Imdur Dose of Nifedipine XL increased to 90 mg   Diabetes mellitus  Continue sliding scale insulin   Nausea vomiting  Resolved at this time   Headache  Secondary to hypertensive urgency  Improved after he received morphine   Abdominal pain  Resolved at this time  Epistaxis Resolved Will start Afrin nasal drops for 4 doses  Acute on chronic kidney disease Creatinine is down to 3.40 which is around his baseline Patient did not follow up with nephrology as  scheduled Nephrology following Started on IV lasix 120 mg q 12 hr for aggressive diuresis as he is still volume overloaded.  Acute on chronic diastolic heart failure Continue Lasix  Code Status: Full code Family Communication: *Discussed with patient in detail Disposition Plan: Home when stable   Consultants:  None  Procedures:  None  Antibiotics:  None  HPI/Subjective: Patient seen and examined denies chest pain, says headache is better. Currently on nicardipine drip, pressure remains labile despite being on multiple antihypertensive medications.   Objective: Filed Vitals:   02/05/13 1040  BP: 151/99  Pulse: 76  Temp:   Resp: 27    Intake/Output Summary (Last 24 hours) at 02/05/13 1136 Last data filed at 02/05/13 0600  Gross per 24 hour  Intake  904.5 ml  Output   4750 ml  Net -3845.5 ml   Filed Weights   02/03/13 0400 02/04/13 1942 02/05/13 0400  Weight: 122.3 kg (269 lb 10 oz) 120.6 kg (265 lb 14 oz) 118.3 kg (260 lb 12.9 oz)    Exam:   General:  Appears in no acute distress  Cardiovascular: S1-S2 is regular  Respiratory: *Clear bilaterally  Abdomen: *Soft nontender no organomegaly  Musculoskeletal: *No edema of the lower extremities  Data Reviewed: Basic Metabolic Panel:  Recent Labs Lab 02/02/13 0037 02/02/13 0525 02/05/13 0858  NA 136 137 135  K 3.6 3.7 3.7  CL 99 103 99  CO2 24 25 23   GLUCOSE 123* 149* 167*  BUN 36* 35* 52*  CREATININE  3.50* 3.40* 4.48*  CALCIUM 9.6 8.8 8.9   Liver Function Tests:  Recent Labs Lab 02/02/13 0037 02/02/13 0525  AST 9 7  ALT 12 9  ALKPHOS 154* 132*  BILITOT 1.0 0.8  PROT 7.7 6.5  ALBUMIN 3.5 2.9*    Recent Labs Lab 02/02/13 0037  LIPASE 24   No results found for this basename: AMMONIA,  in the last 168 hours CBC:  Recent Labs Lab 02/02/13 0037 02/02/13 0525 02/04/13 0745  WBC 16.7* 14.5* 15.4*  HGB 10.1* 9.4* 8.9*  HCT 31.9* 29.7* 27.6*  MCV 70.1* 70.7* 69.9*  PLT 196 189  250   Cardiac Enzymes:  Recent Labs Lab 02/02/13 0037  TROPONINI <0.30   BNP (last 3 results)  Recent Labs  01/06/13 1847 01/24/13 0720  PROBNP 61864.0* >70000.0*   CBG:  Recent Labs Lab 02/03/13 2107 02/04/13 0803 02/04/13 1249 02/04/13 1731 02/04/13 2137  GLUCAP 163* 122* 135* 128* 141*    Recent Results (from the past 240 hour(s))  MRSA PCR SCREENING     Status: None   Collection Time    02/02/13  4:58 AM      Result Value Range Status   MRSA by PCR NEGATIVE  NEGATIVE Final   Comment:            The GeneXpert MRSA Assay (FDA     approved for NASAL specimens     only), is one component of a     comprehensive MRSA colonization     surveillance program. It is not     intended to diagnose MRSA     infection nor to guide or     monitor treatment for     MRSA infections.     Studies: No results found.  Scheduled Meds: . aspirin  325 mg Oral Daily  . cloNIDine  0.3 mg Oral BID  . enoxaparin (LOVENOX) injection  40 mg Subcutaneous Q24H  . ferrous sulfate  325 mg Oral BID  . furosemide  120 mg Intravenous BID  . gabapentin  300 mg Oral TID  . hydrALAZINE  50 mg Oral QID  . insulin aspart  0-9 Units Subcutaneous TID WC  . isosorbide mononitrate  30 mg Oral Daily  . labetalol  400 mg Oral BID  . lisinopril  5 mg Oral BID  . NIFEdipine  90 mg Oral Daily  . sodium chloride  3 mL Intravenous Q12H   Continuous Infusions: . niCARDipine 5 mg/hr (02/05/13 0844)    Principal Problem:   Hypertensive emergency Active Problems:   Chronic kidney disease (CKD), stage IV (severe)   Nausea and vomiting   Anemia in chronic kidney disease   Secondary cardiomyopathy   HFrEF (heart failure with reduced ejection fraction)   Headache(784.0)   Noncompliance   Cholelithiases    Time spent: *35 minutes    [facility] Pager [phone]. If 7PM-7AM, please contact night-coverage at [email or web address], password [facility] 02/05/2013, 11:36 AM  LOS: 3 days

## 2013-02-05 NOTE — Progress Notes (Signed)
Renal Service Daily Progress Note [facility]  Subjective: Feels a lot better, HA and nausea gone, eating solid food.  Had 5.4L UOP yest on IV lasix, BP still high on cardene at 5mg /hr  Physical Exam:  Blood pressure 182/106, pulse 80, temperature 98.2 F (36.8 C), temperature source Oral, resp. rate 24, height 5\' 11"  (1.803 m), weight 118.3 kg (260 lb 12.9 oz), SpO2 100.00%. Gen: obese, alert young AAM in no distress  Skin: no rash, cyanosis  HEENT: EOMI, sclera anicteric, throat clear  Neck: + JVD, no LAN  Chest: clear bilat, no rales or wheezing  CV: regular, no rub or M, pedal pulses intact  Abdomen: soft, obese, protuberant, no ascites, liver down 4 cm  Ext: 2+ pedal edema bilat, no joint effusion, no gangrene/ulcers  Neuro: alert, Ox3, nonfocal   Assessment:  1. HTN urgency / vol overload / CKD IV- still vol xs, cont IV lasix, add ACEI bid, wean cardene as tol, ^procardia; has had w/u for secondary HTN which was neg 2. CKD IV- eGFR around 70ml /min 3. Nausea/vomiting- doubt he is uremic with a eGFR of 25, this is better today 4. DM2 5. Cardiomyopathy EF 30%, prob due to HTN 6. Anemia of CKD, Hb 8-9, check Fe, give darbe x 1 100 ug  P- as above  Kelly Splinter  MD Pager [phone]    Cell  [phone] 02/05/2013, 7:50 AM    Recent Labs Lab 02/02/13 0037 02/02/13 0525  NA 136 137  K 3.6 3.7  CL 99 103  CO2 24 25  GLUCOSE 123* 149*  BUN 36* 35*  CREATININE 3.50* 3.40*  CALCIUM 9.6 8.8    Recent Labs Lab 02/02/13 0037 02/02/13 0525  AST 9 7  ALT 12 9  ALKPHOS 154* 132*  BILITOT 1.0 0.8  PROT 7.7 6.5  ALBUMIN 3.5 2.9*    Recent Labs Lab 02/02/13 0037 02/02/13 0525 02/04/13 0745  WBC 16.7* 14.5* 15.4*  HGB 10.1* 9.4* 8.9*  HCT 31.9* 29.7* 27.6*  MCV 70.1* 70.7* 69.9*  PLT 196 189 250   . aspirin  325 mg Oral Daily  . cloNIDine  0.3 mg Oral BID  . enoxaparin (LOVENOX) injection  40 mg Subcutaneous Q24H  . ferrous sulfate  325 mg  Oral BID  . furosemide  120 mg Intravenous Q8H  . gabapentin  300 mg Oral TID  . hydrALAZINE  50 mg Oral QID  . insulin aspart  0-9 Units Subcutaneous TID WC  . isosorbide mononitrate  30 mg Oral Daily  . labetalol  400 mg Oral BID  . NIFEdipine  60 mg Oral Daily  . sodium chloride  3 mL Intravenous Q12H   . niCARDipine 5 mg/hr (02/05/13 0429)   sodium chloride, HYDROcodone-acetaminophen, ondansetron (ZOFRAN) IV, ondansetron, sodium chloride

## 2013-02-05 NOTE — Progress Notes (Signed)
Pt had episode of epistaxis.

## 2013-02-06 LAB — GLUCOSE, CAPILLARY: Glucose-Capillary: 106 mg/dL — ABNORMAL HIGH (ref 70–99)

## 2013-02-06 MED ORDER — HEPARIN SODIUM (PORCINE) 5000 UNIT/ML IJ SOLN
5000.0000 [IU] | Freq: Three times a day (TID) | INTRAMUSCULAR | Status: DC
  Administered 2013-02-06 – 2013-02-07 (×3): 5000 [IU] via SUBCUTANEOUS
  Filled 2013-02-06 (×6): qty 1

## 2013-02-06 MED ORDER — LABETALOL HCL 300 MG PO TABS
300.0000 mg | ORAL_TABLET | Freq: Two times a day (BID) | ORAL | Status: DC
  Administered 2013-02-06 – 2013-02-07 (×2): 300 mg via ORAL
  Filled 2013-02-06 (×3): qty 1

## 2013-02-06 MED ORDER — HEPARIN SODIUM (PORCINE) 5000 UNIT/ML IJ SOLN: 5000.0000 [IU] | Freq: Three times a day (TID) | INTRAMUSCULAR | Status: DC

## 2013-02-06 MED ORDER — HYDRALAZINE HCL 50 MG PO TABS
50.0000 mg | ORAL_TABLET | Freq: Three times a day (TID) | ORAL | Status: DC
  Administered 2013-02-06 – 2013-02-07 (×2): 50 mg via ORAL
  Filled 2013-02-06 (×4): qty 1

## 2013-02-06 NOTE — Progress Notes (Signed)
Pt. Had small nose bleed, stopped quickly with tissue applied to right nare and ice pack. Continue to assess and monitor.

## 2013-02-06 NOTE — Progress Notes (Signed)
Renal Service Daily Progress Note [facility]  Subjective: Feeling better, out of ICU. BP 153/88 today  Physical Exam:  Blood pressure 153/88, pulse 71, temperature 97.5 F (36.4 C), temperature source Oral, resp. rate 18, height 5\' 11"  (1.803 m), weight 113.036 kg (249 lb 3.2 oz), SpO2 100.00%. Gen: alert, no distress Skin: no rash, cyanosis  HEENT: EOMI, sclera anicteric, throat clear  Neck: no JVD, no LAN  Chest: clear bilat, no rales or wheezing  CV: regular, no rub or M, pedal pulses intact  Abdomen: soft, obese, protuberant, no ascites, liver down 4 cm  Ext: trace ankle edema bilat Neuro: alert, Ox3, nonfocal   Assessment:  1. HTN urgency / vol overload / CKD IV- has had w/u for secondary HTN which was neg. BP better after diuresis and with adding ACEI. Creat up w new ACEI and IV lasix, will stop lasix and recheck creat am. Change hydralazine to tid. Have rescheduled appt with Dr Moshe Cipro at [facility], see D/C instructions. Will follow up tomorrow 2. CKD IV- eGFR around 20-67ml /min 3. Nausea/vomiting- resolved 4. DM2 5. Cardiomyopathy EF 30%, prob due to HTN 6. Anemia of CKD, Hb 8-9, check Fe, give darbe x 1 100 ug  Kelly Splinter  MD Pager [phone]    Cell  [phone] 02/06/2013, 3:50 PM    Recent Labs Lab 02/02/13 0037 02/02/13 0525 02/05/13 0858  NA 136 137 135  K 3.6 3.7 3.7  CL 99 103 99  CO2 24 25 23   GLUCOSE 123* 149* 167*  BUN 36* 35* 52*  CREATININE 3.50* 3.40* 4.48*  CALCIUM 9.6 8.8 8.9    Recent Labs Lab 02/02/13 0037 02/02/13 0525  AST 9 7  ALT 12 9  ALKPHOS 154* 132*  BILITOT 1.0 0.8  PROT 7.7 6.5  ALBUMIN 3.5 2.9*    Recent Labs Lab 02/02/13 0037 02/02/13 0525 02/04/13 0745  WBC 16.7* 14.5* 15.4*  HGB 10.1* 9.4* 8.9*  HCT 31.9* 29.7* 27.6*  MCV 70.1* 70.7* 69.9*  PLT 196 189 250   . aspirin  325 mg Oral Daily  . cloNIDine  0.3 mg Oral BID  . ferrous sulfate  325 mg Oral BID  . gabapentin  300 mg Oral TID  .  heparin subcutaneous  5,000 Units Subcutaneous Q8H  . hydrALAZINE  50 mg Oral QID  . insulin aspart  0-9 Units Subcutaneous TID WC  . isosorbide mononitrate  30 mg Oral Daily  . labetalol  400 mg Oral BID  . lisinopril  5 mg Oral BID  . NIFEdipine  90 mg Oral Daily  . sodium chloride  3 mL Intravenous Q12H     sodium chloride, HYDROcodone-acetaminophen, ondansetron (ZOFRAN) IV, ondansetron, sodium chloride

## 2013-02-06 NOTE — Progress Notes (Signed)
[facility] PROGRESS NOTE  Joshua [name] [id number] DOB: Dec 06, 1980 DOA: 02/02/2013 PCP: Provider Not In System  Interval history 32 yo male h/o esrd (not on dialysis yet), htn, dm, osa (cpap noncompliant), cardiomyopathy, comes into ED for n/v x 2 days. He has had several bouts of n/v that last for days with associated abd pain. Pt is poor historian. Has not been able to keep meds down for 2 days. Started having headache and his spb was very high at home, so came to ED. Denies any fevers. No diarrhea. Pt was suppose to see nephrology last month to set up access for dialysis initiation in the future but he missed that appt and has not established with a nephrologist yet. Denies abd pain now. No n/v since in ED. On nipride gtt with some improvement of his initial bp of 240/133. Denies cp or sob. Denies le edema or swelling. The patient was started on nitroprusside drip, which was weaned off after his home medications were started. Last night patient blood pressure was again elevated requiring nicardipine drip, today he was also started on Procardia XL 60 mg by mouth daily. Dose of hydralazine was increased to 50 mg 4 times a day.   Assessment/Plan:  Hypertensive urgency  Patient is on multiple antihypertensive medications And his blood pressure  seems to be under control    Hypertension  Continue  Catapres, labetalol as scheduled  Also started on nifedipine XL , hydralazine, Imdur Dose of Nifedipine XL increased to 90 mg   Diabetes mellitus  Continue sliding scale insulin   Nausea/vomiting  Resolved at this time   Headache  Secondary to hypertensive urgency  Improved after he received morphine   Abdominal pain  Resolved at this time  Epistaxis Resolved Will start Afrin nasal drops for 4 doses  Acute on chronic kidney disease Creatinine is down to 3.40 which is around his baseline Patient did not follow up with nephrology as scheduled Nephrology following Started on IV  lasix 120 mg q 12 hr for aggressive diuresis   Acute on chronic diastolic heart failure Continue Lasix  Code Status: Full code Family Communication: patient Disposition Plan: Home when stable- transfer to floor   Consultants:  None  Procedures:  None  Antibiotics:  None  HPI/Subjective: Patient seen and examined denies chest pain, says headache is better. Currently on nicardipine drip, pressure remains labile despite being on multiple antihypertensive medications.   Objective: Filed Vitals:   02/06/13 0754  BP: 171/110  Pulse: 72  Temp:   Resp: 20    Intake/Output Summary (Last 24 hours) at 02/06/13 0758 Last data filed at 02/06/13 0700  Gross per 24 hour  Intake 1485.5 ml  Output   6475 ml  Net -4989.5 ml   Filed Weights   02/03/13 0400 02/04/13 1942 02/05/13 0400  Weight: 122.3 kg (269 lb 10 oz) 120.6 kg (265 lb 14 oz) 118.3 kg (260 lb 12.9 oz)    Exam:   General:  Appears in no acute distress  Cardiovascular: S1-S2 is regular  Respiratory: Clear bilaterally  Abdomen: Soft nontender no organomegaly  Musculoskeletal: No edema of the lower extremities  Data Reviewed: Basic Metabolic Panel:  Recent Labs Lab 02/02/13 0037 02/02/13 0525 02/05/13 0858  NA 136 137 135  K 3.6 3.7 3.7  CL 99 103 99  CO2 24 25 23   GLUCOSE 123* 149* 167*  BUN 36* 35* 52*  CREATININE 3.50* 3.40* 4.48*  CALCIUM 9.6 8.8 8.9   Liver Function Tests:  Recent Labs Lab 02/02/13 0037 02/02/13 0525  AST 9 7  ALT 12 9  ALKPHOS 154* 132*  BILITOT 1.0 0.8  PROT 7.7 6.5  ALBUMIN 3.5 2.9*    Recent Labs Lab 02/02/13 0037  LIPASE 24   No results found for this basename: AMMONIA,  in the last 168 hours CBC:  Recent Labs Lab 02/02/13 0037 02/02/13 0525 02/04/13 0745  WBC 16.7* 14.5* 15.4*  HGB 10.1* 9.4* 8.9*  HCT 31.9* 29.7* 27.6*  MCV 70.1* 70.7* 69.9*  PLT 196 189 250   Cardiac Enzymes:  Recent Labs Lab 02/02/13 0037  TROPONINI <0.30   BNP  (last 3 results)  Recent Labs  01/06/13 1847 01/24/13 0720  PROBNP 61864.0* >70000.0*   CBG:  Recent Labs Lab 02/04/13 2137 02/05/13 0752 02/05/13 1239 02/05/13 1713 02/05/13 2118  GLUCAP 141* 136* 146* 144* 158*    Recent Results (from the past 240 hour(s))  MRSA PCR SCREENING     Status: None   Collection Time    02/02/13  4:58 AM      Result Value Range Status   MRSA by PCR NEGATIVE  NEGATIVE Final   Comment:            The GeneXpert MRSA Assay (FDA     approved for NASAL specimens     only), is one component of a     comprehensive MRSA colonization     surveillance program. It is not     intended to diagnose MRSA     infection nor to guide or     monitor treatment for     MRSA infections.     Studies: No results found.  Scheduled Meds: . aspirin  325 mg Oral Daily  . cloNIDine  0.3 mg Oral BID  . enoxaparin (LOVENOX) injection  40 mg Subcutaneous Q24H  . ferrous sulfate  325 mg Oral BID  . furosemide  120 mg Intravenous BID  . gabapentin  300 mg Oral TID  . hydrALAZINE  50 mg Oral QID  . insulin aspart  0-9 Units Subcutaneous TID WC  . isosorbide mononitrate  30 mg Oral Daily  . labetalol  400 mg Oral BID  . lisinopril  5 mg Oral BID  . NIFEdipine  90 mg Oral Daily  . sodium chloride  3 mL Intravenous Q12H   Continuous Infusions: . niCARDipine Stopped (02/05/13 1437)    Principal Problem:   Hypertensive emergency Active Problems:   Chronic kidney disease (CKD), stage IV (severe)   Nausea and vomiting   Anemia in chronic kidney disease   Secondary cardiomyopathy   HFrEF (heart failure with reduced ejection fraction)   Headache(784.0)   Noncompliance   Cholelithiases    Time spent: 35 minutes    Joshua [name]  [facility] Pager [phone]  If 7PM-7AM, please contact night-coverage at [email or web address], password [facility] 02/06/2013, 7:58 AM  LOS: 4 days

## 2013-02-07 LAB — CBC
HCT: 33.3 % — ABNORMAL LOW (ref 39.0–52.0)
Hemoglobin: 10.9 g/dL — ABNORMAL LOW (ref 13.0–17.0)
MCV: 68.8 fL — ABNORMAL LOW (ref 78.0–100.0)
RBC: 4.84 MIL/uL (ref 4.22–5.81)
WBC: 12.5 10*3/uL — ABNORMAL HIGH (ref 4.0–10.5)

## 2013-02-07 LAB — BASIC METABOLIC PANEL
BUN: 66 mg/dL — ABNORMAL HIGH (ref 6–23)
CO2: 23 mEq/L (ref 19–32)
Chloride: 95 mEq/L — ABNORMAL LOW (ref 96–112)
Creatinine, Ser: 4.42 mg/dL — ABNORMAL HIGH (ref 0.50–1.35)
GFR calc Af Amer: 19 mL/min — ABNORMAL LOW (ref >90)
Glucose, Bld: 114 mg/dL — ABNORMAL HIGH (ref 70–99)
Potassium: 3.7 mEq/L (ref 3.5–5.1)

## 2013-02-07 LAB — GLUCOSE, CAPILLARY
Glucose-Capillary: 114 mg/dL — ABNORMAL HIGH (ref 70–99)
Glucose-Capillary: 134 mg/dL — ABNORMAL HIGH (ref 70–99)

## 2013-02-07 MED ORDER — FUROSEMIDE 80 MG PO TABS: 80.0000 mg | ORAL_TABLET | Freq: Two times a day (BID) | ORAL | Status: DC

## 2013-02-07 MED ORDER — CLONIDINE HCL 0.3 MG PO TABS: 0.3000 mg | ORAL_TABLET | Freq: Two times a day (BID) | ORAL | Status: DC

## 2013-02-07 MED ORDER — HYDRALAZINE HCL 50 MG PO TABS: 50.0000 mg | ORAL_TABLET | Freq: Three times a day (TID) | ORAL | Status: DC

## 2013-02-07 MED ORDER — ISOSORBIDE MONONITRATE ER 30 MG PO TB24: 30.0000 mg | ORAL_TABLET | Freq: Every day | ORAL | Status: DC

## 2013-02-07 MED ORDER — NIFEDIPINE ER OSMOTIC RELEASE 90 MG PO TB24: 90.0000 mg | ORAL_TABLET | Freq: Every day | ORAL | Status: DC

## 2013-02-07 MED ORDER — LISINOPRIL 5 MG PO TABS: 5.0000 mg | ORAL_TABLET | Freq: Two times a day (BID) | ORAL | Status: DC

## 2013-02-07 MED ORDER — LABETALOL HCL 300 MG PO TABS: 300.0000 mg | ORAL_TABLET | Freq: Two times a day (BID) | ORAL | Status: DC

## 2013-02-07 NOTE — Discharge Summary (Signed)
Physician Discharge Summary  Joshua [name] [id number] DOB: 01-12-1981 DOA: 02/02/2013  PCP: Provider Not In System  Admit date: 02/02/2013 Discharge date: 02/07/2013  Time spent: 79* minutes  Recommendations for Outpatient Follow-up:  *Follow up [place] [facility] in 2 weeks  Discharge Diagnoses:  Principal Problem:   Hypertensive emergency Active Problems:   Chronic kidney disease (CKD), stage IV (severe)   Nausea and vomiting   Anemia in chronic kidney disease   Secondary cardiomyopathy   HFrEF (heart failure with reduced ejection fraction)   Headache(784.0)   Noncompliance   Cholelithiases   Discharge Condition: *Stable   Diet recommendation: *low salt diet  Filed Weights   02/04/13 1942 02/05/13 0400 02/06/13 1015  Weight: 120.6 kg (265 lb 14 oz) 118.3 kg (260 lb 12.9 oz) 113.036 kg (249 lb 3.2 oz)    History of present illness:  32 yo male h/o esrd (not on dialysis yet), htn, dm, osa (cpap noncompliant), cardiomyopathy, comes into ED for n/v x 2 days. He has had several bouts of n/v that last for days with associated abd pain. Pt is poor historian. Has not been able to keep meds down for 2 days. Started having headache and his spb was very high at home, so came to ED. Denies any fevers. No diarrhea. Pt was suppose to see nephrology last month to set up access for dialysis initiation in the future but he missed that appt and has not established with a nephrologist yet. Denies abd pain now. No n/v since in ED. On nipride gtt with some improvement of his initial bp of 240/133. Denies cp or sob. Denies le edema or swelling.   Hospital Course:  *Hypertensive urgency  Patient is on multiple antihypertensive medications  And his blood pressure seems to be under control   Hypertension  Continue Catapres, labetalol as scheduled  Also started on nifedipine XL , hydralazine, Imdur  Dose of Nifedipine XL increased to 90 mg daily Will continue on this  regimen  Diabetes mellitus  Was given  sliding scale insulin  Patient does not want glipizide, says he has controlled with diet Closely monitor the blood glucose at home  Nausea vomiting  Resolved at this time   Headache  Secondary to hypertensive urgency  Improved after he received morphine   Abdominal pain  Resolved at this time  Epistaxis  Resolved  Was given four doses of Afrin  Acute on chronic kidney disease  Creatinine is down to 3.40 which is around his baseline  Patient did not follow up with nephrology as scheduled as outpatient  Nephrology followed in the hospital Started on IV lasix 120 mg q 12 hr for aggressive diuresis as he was still volume overloaded.  Will discharge on lasix 80 mg po BID Follow up Dr Moshe Cipro on 02/22/13  Acute on chronic diastolic heart failure  Continue Lasix   Procedures: *None  Consultations:  nephrology  Discharge Exam: Filed Vitals:   02/07/13 1005  BP: 170/108  Pulse: 68  Temp: 97.8 F (36.6 C)  Resp:     General: *Appear in no acute distress Cardiovascular: S1s2 RRR Respiratory: *Clear bilaterally  Discharge Instructions  Discharge Orders   Future Appointments Provider Department Dept Phone   02/09/2013 11:45 AM [facility] Covering Provider [place] [phone]   Future Orders Complete By Expires   Diet - low sodium heart healthy  As directed    Increase activity slowly  As directed  Medication List    STOP taking these medications       amLODipine 10 MG tablet  Commonly known as:  NORVASC      TAKE these medications       aspirin 325 MG tablet  Take 325 mg by mouth daily.     cloNIDine 0.3 MG tablet  Commonly known as:  CATAPRES  Take 1 tablet (0.3 mg total) by mouth 2 (two) times daily.     ferrous sulfate 325 (65 FE) MG tablet  Take 325 mg by mouth 2 (two) times daily.     furosemide 80 MG tablet  Commonly known as:  LASIX  Take 1 tablet (80 mg  total) by mouth 2 (two) times daily.     gabapentin 100 MG capsule  Commonly known as:  NEURONTIN  Take 3 capsules (300 mg total) by mouth 3 (three) times daily.     hydrALAZINE 50 MG tablet  Commonly known as:  APRESOLINE  Take 1 tablet (50 mg total) by mouth every 8 (eight) hours.     isosorbide mononitrate 30 MG 24 hr tablet  Commonly known as:  IMDUR  Take 1 tablet (30 mg total) by mouth daily.     labetalol 300 MG tablet  Commonly known as:  NORMODYNE  Take 1 tablet (300 mg total) by mouth 2 (two) times daily.     lisinopril 5 MG tablet  Commonly known as:  PRINIVIL,ZESTRIL  Take 1 tablet (5 mg total) by mouth 2 (two) times daily.     NIFEdipine 90 MG 24 hr tablet  Commonly known as:  PROCARDIA XL/ADALAT-CC  Take 1 tablet (90 mg total) by mouth daily.       No Known Allergies     Follow-up Information   Follow up with Louis Meckel, MD On 02/22/2013. (Sept 30th at 4:15 pm wtih Dr Moshe Cipro at [facility])    Specialty:  Nephrology   Contact information:   [place] [id number] [phone]       Follow up with [facility]     On 02/09/2013. (02/09/13 at 11 45 am)    Contact information:   [place] [phone]        The results of significant diagnostics from this hospitalization (including imaging, microbiology, ancillary and laboratory) are listed below for reference.    Significant Diagnostic Studies: Ct Abdomen Pelvis Wo Contrast  01/24/2013   *RADIOLOGY REPORT*  Clinical Data: Right lower quadrant abdominal pain  CT ABDOMEN AND PELVIS WITHOUT CONTRAST  Technique:  Multidetector CT imaging of the abdomen and pelvis was performed following the standard protocol without intravenous contrast. Small amount of oral contrast was administered.  Comparison: None.  Findings: Patchy nodular opacities are present within the partially visualized lung bases, incompletely evaluated.   Cardiomegaly is present.  There is a small pericardial effusion.  Limited noncontrast evaluation the liver is unremarkable.  The gallbladder is normal.  The spleen, adrenal glands are normal.  The pancreas is somewhat ill-defined with prominent peripancreatic fat stranding, suggestive of acute pancreatitis. No loculated fluid collection to suggest pseudocyst formation.  No definite evidence of traumatic necrosis, although evaluation this is markedly limited on this noncontrast examination.  Pancreatic duct is not visualized.  There is no evidence of bowel obstruction. The appendix is not definitely visualized, however, no definite focal inflammatory changes are seen within the right lower quadrant to suggest acute appendicitis.  No abnormal wall  thickening is seen about the bowels.  Bladder is unremarkable.  Prostate is within normal limits.  No free air is identified.  There is a small amount of free fluid within the pelvis  No pathologically enlarged intra-abdominal pelvic lymph nodes are identified.  No acute osseous abnormality identified.  Diffuse anasarca is present.  IMPRESSION:  1.  Ill definition of the pancreas with inflammatory fat stranding of the peripancreatic fat, suggestive of acute pancreatitis. Correlation with serum amylase/lipase is recommended.  No loculated collection to suggest pseudocyst formation identified. 2.  Nonvisualization of the appendix, however, no focal inflammatory changes seen within the right lower quadrant to suggest acute appendicitis. 3. Patchy nodular opacities within the partially visualized lung bases.  Finding is of uncertain significance, but may represent a possible infectious process. 4. Cardiomegaly with trace pericardial effusion. 5.  Small amount of free pelvic fluid. 6.  Anasarca.   Original Report Authenticated By: Jeannine Boga, M.D.   Dg Chest 1 View  01/24/2013   *RADIOLOGY REPORT*  Clinical Data: Hypertension.  Abdominal pain.  CHEST - 1 VIEW   Comparison: 01/06/2013.  Findings: Chronic cardiopericardial enlargement.  Prominent, cephalized pulmonary venous markings.  No focal infiltrate, effusion, pneumothorax.  No pneumoperitoneum.  IMPRESSION:  Cardiomegaly and pulmonary venous hypertension.   Original Report Authenticated By: Jorje Guild   Ct Head Wo Contrast  02/02/2013   *RADIOLOGY REPORT*  Clinical Data: Hypertension.  Headache.  Nausea and vomiting.  CT HEAD WITHOUT CONTRAST  Technique:  Contiguous axial images were obtained from the base of the skull through the vertex without contrast.  Comparison: 09/26/2012  Findings: Vague appear low attenuation change in the deep white matter of the posterior parietal regions bilaterally, similar to previous study.  Ventricles and sulci are symmetrical.  No mass effect or midline shift.  No abnormal extra-axial fluid collections.  Gray-white matter junctions are distinct.  Basal cisterns are not effaced.  No evidence of acute intracranial hemorrhage.  No depressed skull fractures.  Visualized paranasal sinuses and mastoid air cells are not opacified.  IMPRESSION: Vague posterior parietal white matter changes are stable since previous study.  No acute intracranial abnormalities demonstrated.   Original Report Authenticated By: Lucienne Capers, M.D.   US Abdomen Complete  02/02/2013   *RADIOLOGY REPORT*  Clinical Data:  Hypertension.  Abdominal pain.  Nausea and vomiting.  COMPLETE ABDOMINAL ULTRASOUND  Comparison:  01/24/2013  Findings:  Gallbladder:  Small stones layering in the dependent portion of the gallbladder.  Mild gallbladder wall thickening and 5.7 mm.  Unable to assess for Murphy's sign due to patient on morphine.  Stable appearance since previous study.  Common bile duct:  Not dilated.  Diameter measures 2.4 mm.  Liver:  Diffuse increased parenchymal echotexture suggesting fatty infiltration.  No focal lesions identified.  IVC:  Appears normal.  Pancreas:  Pancreas is not visualized due to  overlying bowel gas.  Spleen:  Spleen length measures 13 cm.  Volume is calculated at 278 ml.  Splenic enlargement.  Right Kidney:  Right kidney measures 12 cm length.  No hydronephrosis.  Left Kidney:  Left kidney measures 10 cm length.  No hydronephrosis.  Abdominal aorta:  Visualized portions are nonaneurysmal.  IMPRESSION: Cholelithiasis with mild gallbladder wall thickening.  Findings are stable since previous study.  Mild diffuse fatty infiltration in the liver.  Splenic enlargement.   Original Report Authenticated By: Lucienne Capers, M.D.   US Abdomen Complete  01/24/2013   *RADIOLOGY REPORT*  Clinical Data:  Right-sided abdominal  pain.  COMPLETE ABDOMINAL ULTRASOUND  Comparison:  01/24/2013 CT.  Findings:  Gallbladder:  Gallbladder sludge with shadowing stone.  There is gallbladder wall thickening up to 5 mm, not seen on previous MRI. No sonographic Murphy's sign or marked marked distension.  Common bile duct:  Measures 4 mm.  Liver:  No focal lesion identified.  Within normal limits in parenchymal echogenicity.  IVC:  Appears normal.  Pancreas:  Reference CT examination showing peripancreatic edema. No sonographic abnormality.  Spleen:  Borderline enlarged, 12 cm.  Right Kidney:  Measures 11 cm.  The cortex is diffusely echogenic. No hydronephrosis or focal abnormality.  Left Kidney:  Measures 10 cm.  The cortex is diffusely echogenic. No hydronephrosis or focal abnormality.  Abdominal aorta:  No aneurysm where seen.  Other:  Trace ascites around the liver and gallbladder.  IMPRESSION:  1.  Cholelithiasis and nonspecific gallbladder wall thickening. Lack of sonographic Murphy's sign or marked gallbladder distension argue against acute cholecystitis. 2.  Findings consistent with medical renal disease. 3.  Trace ascites.   Original Report Authenticated By: Jorje Guild   Dg Chest Portable 1 View  02/02/2013   *RADIOLOGY REPORT*  Clinical Data: Hypertension.  PORTABLE CHEST - 1 VIEW  Comparison:  01/24/2013  Findings: Cardiac enlargement.  Increasing pulmonary vascular congestion since previous study.  No edema or focal consolidation is appreciated.  No blunting of costophrenic angles.  No pneumothorax.  IMPRESSION: Stable cardiac enlargement with interval development of mild pulmonary vascular congestion.   Original Report Authenticated By: Lucienne Capers, M.D.    Microbiology: Recent Results (from the past 240 hour(s))  MRSA PCR SCREENING     Status: None   Collection Time    02/02/13  4:58 AM      Result Value Range Status   MRSA by PCR NEGATIVE  NEGATIVE Final   Comment:            The GeneXpert MRSA Assay (FDA     approved for NASAL specimens     only), is one component of a     comprehensive MRSA colonization     surveillance program. It is not     intended to diagnose MRSA     infection nor to guide or     monitor treatment for     MRSA infections.     Labs: Basic Metabolic Panel:  Recent Labs Lab 02/02/13 0037 02/02/13 0525 02/05/13 0858 02/07/13 0455  NA 136 137 135 134*  K 3.6 3.7 3.7 3.7  CL 99 103 99 95*  CO2 24 25 23 23   GLUCOSE 123* 149* 167* 114*  BUN 36* 35* 52* 66*  CREATININE 3.50* 3.40* 4.48* 4.42*  CALCIUM 9.6 8.8 8.9 9.6   Liver Function Tests:  Recent Labs Lab 02/02/13 0037 02/02/13 0525  AST 9 7  ALT 12 9  ALKPHOS 154* 132*  BILITOT 1.0 0.8  PROT 7.7 6.5  ALBUMIN 3.5 2.9*    Recent Labs Lab 02/02/13 0037  LIPASE 24   No results found for this basename: AMMONIA,  in the last 168 hours CBC:  Recent Labs Lab 02/02/13 0037 02/02/13 0525 02/04/13 0745 02/07/13 0455  WBC 16.7* 14.5* 15.4* 12.5*  HGB 10.1* 9.4* 8.9* 10.9*  HCT 31.9* 29.7* 27.6* 33.3*  MCV 70.1* 70.7* 69.9* 68.8*  PLT 196 189 250 420*   Cardiac Enzymes:  Recent Labs Lab 02/02/13 0037  TROPONINI <0.30   BNP: BNP (last 3 results)  Recent Labs  01/06/13 1847 01/24/13 0720  PROBNP 61864.0* >70000.0*   CBG:  Recent Labs Lab 02/06/13 0748  02/06/13 1142 02/06/13 1611 02/06/13 2222 02/07/13 0724  GLUCAP 124* 142* 106* 119* 114*       Signed:  Gabrial Poppell S  [facility] 02/07/2013, 11:10 AM

## 2013-02-07 NOTE — Progress Notes (Signed)
Patient verbalized he already is established with "My Chart"

## 2013-02-07 NOTE — Progress Notes (Signed)
Was asked by MD to assist pt with 2 prescriptions Procardia and Labetolol. Pt was assisted with prescriptions in 5/14 hence he is not eligible for assistance at this time. He has an appt with the [facility] on 9/17 @ 11:45. They stated that they will assist him with his prescriptions once he establishes care. I spoke with [facility] and got the price of the meds for 2 days before his appt. Pt stated he could afford to pay for these medications until he is seen on 9/17. I reinforced compliance to him to avoid readmission.  Allene Dillon RN BSN   [phone]

## 2013-02-09 ENCOUNTER — Inpatient Hospital Stay: Payer: Self-pay

## 2013-02-28 ENCOUNTER — Other Ambulatory Visit: Payer: Self-pay | Admitting: *Deleted

## 2013-02-28 DIAGNOSIS — Z01810 Encounter for preprocedural cardiovascular examination: Secondary | ICD-10-CM

## 2013-02-28 DIAGNOSIS — N184 Chronic kidney disease, stage 4 (severe): Secondary | ICD-10-CM

## 2013-03-09 ENCOUNTER — Encounter: Payer: Self-pay | Admitting: Vascular Surgery

## 2013-03-10 ENCOUNTER — Other Ambulatory Visit (HOSPITAL_COMMUNITY): Payer: Self-pay

## 2013-03-10 ENCOUNTER — Encounter (HOSPITAL_COMMUNITY): Payer: Self-pay

## 2013-03-10 ENCOUNTER — Ambulatory Visit: Payer: Self-pay | Admitting: Vascular Surgery

## 2013-03-31 ENCOUNTER — Other Ambulatory Visit: Payer: Self-pay

## 2013-04-11 ENCOUNTER — Emergency Department (HOSPITAL_COMMUNITY): Payer: Medicaid Other

## 2013-04-11 ENCOUNTER — Encounter (HOSPITAL_COMMUNITY): Payer: Self-pay | Admitting: Emergency Medicine

## 2013-04-11 ENCOUNTER — Inpatient Hospital Stay (HOSPITAL_COMMUNITY)
Admission: EM | Admit: 2013-04-11 | Discharge: 2013-04-19 | DRG: 674 | Disposition: A | Discharge status: Home / Self Care | Payer: Medicaid Other | Attending: Internal Medicine | Admitting: Internal Medicine

## 2013-04-11 DIAGNOSIS — F909 Attention-deficit hyperactivity disorder, unspecified type: Secondary | ICD-10-CM | POA: Diagnosis present

## 2013-04-11 DIAGNOSIS — R51 Headache: Secondary | ICD-10-CM | POA: Diagnosis present

## 2013-04-11 DIAGNOSIS — M129 Arthropathy, unspecified: Secondary | ICD-10-CM | POA: Diagnosis present

## 2013-04-11 DIAGNOSIS — H538 Other visual disturbances: Secondary | ICD-10-CM | POA: Diagnosis present

## 2013-04-11 DIAGNOSIS — Z9119 Patient's noncompliance with other medical treatment and regimen: Secondary | ICD-10-CM

## 2013-04-11 DIAGNOSIS — N19 Unspecified kidney failure: Secondary | ICD-10-CM

## 2013-04-11 DIAGNOSIS — I10 Essential (primary) hypertension: Secondary | ICD-10-CM

## 2013-04-11 DIAGNOSIS — E872 Acidosis, unspecified: Secondary | ICD-10-CM | POA: Diagnosis present

## 2013-04-11 DIAGNOSIS — E669 Obesity, unspecified: Secondary | ICD-10-CM | POA: Diagnosis present

## 2013-04-11 DIAGNOSIS — I129 Hypertensive chronic kidney disease with stage 1 through stage 4 chronic kidney disease, or unspecified chronic kidney disease: Principal | ICD-10-CM | POA: Diagnosis present

## 2013-04-11 DIAGNOSIS — D631 Anemia in chronic kidney disease: Secondary | ICD-10-CM | POA: Diagnosis present

## 2013-04-11 DIAGNOSIS — I5022 Chronic systolic (congestive) heart failure: Secondary | ICD-10-CM

## 2013-04-11 DIAGNOSIS — I169 Hypertensive crisis, unspecified: Secondary | ICD-10-CM

## 2013-04-11 DIAGNOSIS — N186 End stage renal disease: Secondary | ICD-10-CM | POA: Diagnosis present

## 2013-04-11 DIAGNOSIS — R112 Nausea with vomiting, unspecified: Secondary | ICD-10-CM | POA: Diagnosis present

## 2013-04-11 DIAGNOSIS — Z91199 Patient's noncompliance with other medical treatment and regimen due to unspecified reason: Secondary | ICD-10-CM

## 2013-04-11 DIAGNOSIS — N184 Chronic kidney disease, stage 4 (severe): Secondary | ICD-10-CM

## 2013-04-11 DIAGNOSIS — N179 Acute kidney failure, unspecified: Secondary | ICD-10-CM | POA: Diagnosis present

## 2013-04-11 DIAGNOSIS — I161 Hypertensive emergency: Secondary | ICD-10-CM | POA: Diagnosis present

## 2013-04-11 DIAGNOSIS — E119 Type 2 diabetes mellitus without complications: Secondary | ICD-10-CM | POA: Diagnosis present

## 2013-04-11 DIAGNOSIS — Z87891 Personal history of nicotine dependence: Secondary | ICD-10-CM

## 2013-04-11 DIAGNOSIS — N189 Chronic kidney disease, unspecified: Secondary | ICD-10-CM | POA: Diagnosis present

## 2013-04-11 DIAGNOSIS — Z8249 Family history of ischemic heart disease and other diseases of the circulatory system: Secondary | ICD-10-CM

## 2013-04-11 DIAGNOSIS — D72829 Elevated white blood cell count, unspecified: Secondary | ICD-10-CM | POA: Diagnosis present

## 2013-04-11 DIAGNOSIS — K8020 Calculus of gallbladder without cholecystitis without obstruction: Secondary | ICD-10-CM

## 2013-04-11 DIAGNOSIS — I4891 Unspecified atrial fibrillation: Secondary | ICD-10-CM | POA: Diagnosis present

## 2013-04-11 DIAGNOSIS — I428 Other cardiomyopathies: Secondary | ICD-10-CM | POA: Diagnosis present

## 2013-04-11 DIAGNOSIS — Z833 Family history of diabetes mellitus: Secondary | ICD-10-CM

## 2013-04-11 DIAGNOSIS — G4730 Sleep apnea, unspecified: Secondary | ICD-10-CM | POA: Diagnosis present

## 2013-04-11 LAB — CBC WITH DIFFERENTIAL/PLATELET
Eosinophils Absolute: 0 10*3/uL (ref 0.0–0.7)
Eosinophils Relative: 0 % (ref 0–5)
Lymphocytes Relative: 10 % — ABNORMAL LOW (ref 12–46)
MCH: 22.6 pg — ABNORMAL LOW (ref 26.0–34.0)
Monocytes Absolute: 0.6 10*3/uL (ref 0.1–1.0)
Neutrophils Relative %: 86 % — ABNORMAL HIGH (ref 43–77)
Platelets: 271 10*3/uL (ref 150–400)
RBC: 3.89 MIL/uL — ABNORMAL LOW (ref 4.22–5.81)
WBC: 15 10*3/uL — ABNORMAL HIGH (ref 4.0–10.5)

## 2013-04-11 LAB — BLOOD GAS, ARTERIAL
Bicarbonate: 18.8 mEq/L — ABNORMAL LOW (ref 20.0–24.0)
O2 Saturation: 95.4 %
Patient temperature: 98.6
TCO2: 17.6 mmol/L (ref 0–100)

## 2013-04-11 LAB — COMPREHENSIVE METABOLIC PANEL
Alkaline Phosphatase: 229 U/L — ABNORMAL HIGH (ref 39–117)
BUN: 74 mg/dL — ABNORMAL HIGH (ref 6–23)
CO2: 17 mEq/L — ABNORMAL LOW (ref 19–32)
Calcium: 9.4 mg/dL (ref 8.4–10.5)
Creatinine, Ser: 5.63 mg/dL — ABNORMAL HIGH (ref 0.50–1.35)
GFR calc Af Amer: 14 mL/min — ABNORMAL LOW (ref >90)
Glucose, Bld: 167 mg/dL — ABNORMAL HIGH (ref 70–99)
Potassium: 4 mEq/L (ref 3.5–5.1)
Total Bilirubin: 2 mg/dL — ABNORMAL HIGH (ref 0.3–1.2)
Total Protein: 8.1 g/dL (ref 6.0–8.3)

## 2013-04-11 LAB — POCT I-STAT, CHEM 8
Creatinine, Ser: 6.2 mg/dL — ABNORMAL HIGH (ref 0.50–1.35)
Glucose, Bld: 162 mg/dL — ABNORMAL HIGH (ref 70–99)
Hemoglobin: 9.9 g/dL — ABNORMAL LOW (ref 13.0–17.0)
Sodium: 138 mEq/L (ref 135–145)
TCO2: 18 mmol/L (ref 0–100)

## 2013-04-11 LAB — LIPASE, BLOOD: Lipase: 21 U/L (ref 11–59)

## 2013-04-11 LAB — GLUCOSE, CAPILLARY: Glucose-Capillary: 176 mg/dL — ABNORMAL HIGH (ref 70–99)

## 2013-04-11 MED ORDER — ONDANSETRON HCL 4 MG/2ML IJ SOLN
4.0000 mg | Freq: Once | INTRAMUSCULAR | Status: AC
  Administered 2013-04-11: 4 mg via INTRAVENOUS
  Filled 2013-04-11: qty 2

## 2013-04-11 MED ORDER — FUROSEMIDE 10 MG/ML IJ SOLN
80.0000 mg | Freq: Two times a day (BID) | INTRAMUSCULAR | Status: DC
  Administered 2013-04-11 – 2013-04-13 (×5): 80 mg via INTRAVENOUS
  Filled 2013-04-11 (×8): qty 8

## 2013-04-11 MED ORDER — CLONIDINE HCL 0.1 MG PO TABS
0.2000 mg | ORAL_TABLET | Freq: Once | ORAL | Status: AC
  Administered 2013-04-11: 0.2 mg via ORAL
  Filled 2013-04-11: qty 2

## 2013-04-11 MED ORDER — LABETALOL HCL 5 MG/ML IV SOLN
10.0000 mg | Freq: Once | INTRAVENOUS | Status: AC
  Administered 2013-04-11: 10 mg via INTRAVENOUS
  Filled 2013-04-11: qty 4

## 2013-04-11 MED ORDER — SODIUM CHLORIDE 0.9 % IV SOLN
INTRAVENOUS | Status: DC
  Administered 2013-04-11: 22:00:00 via INTRAVENOUS

## 2013-04-11 MED ORDER — NICARDIPINE HCL IN NACL 20-0.86 MG/200ML-% IV SOLN
5.0000 mg/h | Freq: Once | INTRAVENOUS | Status: AC
  Administered 2013-04-11: 5 mg/h via INTRAVENOUS
  Filled 2013-04-11: qty 200

## 2013-04-11 NOTE — ED Notes (Signed)
Bed: [id number] Expected date:  Expected time:  Means of arrival:  Comments: EMS/N/V/dizziness since this am-BP 240/166

## 2013-04-11 NOTE — ED Provider Notes (Signed)
CSN: [id number]     Arrival date & time 04/11/13  2117 History   First MD Initiated Contact with Patient 04/11/13 2150     Chief Complaint  Patient presents with  . Hypertension  . Emesis   (Consider location/radiation/quality/duration/timing/severity/associated sxs/prior Treatment) Patient is a 32 y.o. male presenting with hypertension and vomiting. The history is provided by the patient.  Hypertension  Emesis  patient here with nausea and vomiting since this morning. Noted that his blood pressure at home as been elevated with a systolic in the [id number] and diastolic in the 123456. Denies any headache or visual changes. Has had some neck pain without photophobia. Denies any This. No chest pain or chest pressure. Denies any dyspnea. States that when he is unable to keep down his blood pressure medications that he gets severe hypertension. Denies any decreased urination or dark urine. Symptoms progressively worse and no treatment used prior to arrival and nothing makes them better worse. EMS was called and blood pressure was noted to be 230/160.  Past Medical History  Diagnosis Date  . Arthritis   . CKD (chronic kidney disease) stage 4, GFR 15-29 ml/min   . Hypertension     Hypertensive urgency/emergency - no RAS, normal aldo/PRA ratio,high urine metanephrines and normetanephrines   . Atrial fibrillation   . Type II diabetes mellitus   . History of bronchitis   . Sleep apnea     No CPAP  . Headache(784.0)     Regular  . Anemia of chronic disease    Past Surgical History  Procedure Laterality Date  . No past surgeries     Family History  Problem Relation Age of Onset  . Diabetes Father   . Hypertension Father   . Hypertension Maternal Grandmother   . Hypertension Maternal Grandfather   . Kidney disease Maternal Grandfather   . Diabetes Maternal Grandfather    History  Substance Use Topics  . Smoking status: Former Smoker -- .1 years    Types: Cigarettes    Quit date:  09/24/1999  . Smokeless tobacco: Never Used  . Alcohol Use: No    Review of Systems  Gastrointestinal: Positive for vomiting.  All other systems reviewed and are negative.    Allergies  Review of patient's allergies indicates no known allergies.  Home Medications   Current Outpatient Rx  Name  Route  Sig  Dispense  Refill  . acetaminophen (TYLENOL) 500 MG tablet   Oral   Take 1,000 mg by mouth every 6 (six) hours as needed. For pain         . aspirin 325 MG tablet   Oral   Take 325 mg by mouth daily.         . ferrous sulfate 325 (65 FE) MG tablet   Oral   Take 325 mg by mouth 2 (two) times daily.         . furosemide (LASIX) 80 MG tablet   Oral   Take 1 tablet (80 mg total) by mouth 2 (two) times daily.   60 tablet   0   . hydrALAZINE (APRESOLINE) 100 MG tablet   Oral   Take 100 mg by mouth every 6 (six) hours.         . isosorbide mononitrate (IMDUR) 30 MG 24 hr tablet   Oral   Take 1 tablet (30 mg total) by mouth daily.   30 tablet   2   . lisinopril (PRINIVIL,ZESTRIL) 5 MG tablet   Oral  Take 1 tablet (5 mg total) by mouth 2 (two) times daily.   60 tablet   2   . metoprolol (LOPRESSOR) 100 MG tablet   Oral   Take 50 mg by mouth daily.         . ondansetron (ZOFRAN) 4 MG tablet   Oral   Take 4 mg by mouth every 8 (eight) hours as needed for nausea or vomiting.         . traMADol (ULTRAM) 50 MG tablet   Oral   Take 50 mg by mouth every 6 (six) hours as needed. For pain          BP 252/174  Temp(Src) 97.5 F (36.4 C) (Oral)  Resp 29  Ht 5\' 11"  (1.803 m)  Wt 250 lb (113.399 kg)  BMI 34.88 kg/m2  SpO2 100% Physical Exam  Nursing note and vitals reviewed. Constitutional: He is oriented to person, place, and time. He appears well-developed and well-nourished.  Non-toxic appearance. No distress.  HENT:  Head: Normocephalic and atraumatic.  Eyes: Conjunctivae, EOM and lids are normal. Pupils are equal, round, and reactive to  light.  Neck: Normal range of motion. Neck supple. No rigidity. No tracheal deviation and normal range of motion present. No mass present.  Cardiovascular: Normal rate, regular rhythm and normal heart sounds.  Exam reveals no gallop.   No murmur heard. Pulmonary/Chest: Effort normal and breath sounds normal. No stridor. No respiratory distress. He has no decreased breath sounds. He has no wheezes. He has no rhonchi. He has no rales.  Abdominal: Soft. Normal appearance and bowel sounds are normal. He exhibits no distension. There is no tenderness. There is no rebound and no CVA tenderness.  Musculoskeletal: Normal range of motion. He exhibits no edema and no tenderness.  Neurological: He is alert and oriented to person, place, and time. He has normal strength. No cranial nerve deficit or sensory deficit. GCS eye subscore is 4. GCS verbal subscore is 5. GCS motor subscore is 6.  Skin: Skin is warm and dry. No abrasion and no rash noted.  Psychiatric: He has a normal mood and affect. His speech is normal and behavior is normal.    ED Course  Procedures (including critical care time) Labs Review Labs Reviewed  GLUCOSE, CAPILLARY - Abnormal; Notable for the following:    Glucose-Capillary 176 (*)    All other components within normal limits  CBC WITH DIFFERENTIAL  COMPREHENSIVE METABOLIC PANEL  URINALYSIS, ROUTINE W REFLEX MICROSCOPIC  TROPONIN I  LIPASE, BLOOD   Imaging Review No results found.  EKG Interpretation    Date/Time:  Monday April 11 2013 21:21:35 EST Ventricular Rate:  106 PR Interval:  160 QRS Duration: 101 QT Interval:  389 QTC Calculation: 517 R Axis:   31 Text Interpretation:  Sinus tachycardia Probable left atrial enlargement Prolonged QT interval No significant change since last tracing            MDM  No diagnosis found. Patient given Cardene IV piggyback as a drip. BP blood pressure remained very elevated and Cardene drip was titrated. His EKG shows  no signs of ACS. His old records were reviewed. Current labs show worsening of his chronic renal insufficiency to the point now believe that he is in renal failure is marked by the acidosis. Head CT was negative for blood. Do not believe he had a subarachnoid hemorrhage. Patient was re\re examined multiple times and remained stable. Spoke with [facility] and patient will be admitted  to step down. He is currently on her Cardene drip at 10 mg per hour.  CRITICAL CARE Performed by: Leota Jacobsen Total critical care time: 60 Critical care time was exclusive of separately billable procedures and treating other patients. Critical care was necessary to treat or prevent imminent or life-threatening deterioration. Critical care was time spent personally by me on the following activities: development of treatment plan with patient and/or surrogate as well as nursing, discussions with consultants, evaluation of patient's response to treatment, examination of patient, obtaining history from patient or surrogate, ordering and performing treatments and interventions, ordering and review of laboratory studies, ordering and review of radiographic studies, pulse oximetry and re-evaluation of patient's condition.     Leota Jacobsen, MD 04/11/13 [phone]

## 2013-04-11 NOTE — ED Notes (Signed)
Spoke with Dr. Zenia Resides about pt's BP 255/158 on Cardene, stated to increase to 10 mg/hr at this time.

## 2013-04-11 NOTE — ED Notes (Signed)
Pt reports hx of hypertension, currently 238/160, reports having generalized body aches and severe neck pain x 3 days, reports some blurred vision, reports vomited today x2 episodes.

## 2013-04-11 NOTE — ED Notes (Signed)
Verbal order given to slowly titrate Cardene down after giving Clonidine, Labetalol and Lasix

## 2013-04-11 NOTE — ED Notes (Signed)
RT at bedside at this time for ABG

## 2013-04-12 ENCOUNTER — Encounter (HOSPITAL_COMMUNITY): Payer: Self-pay | Admitting: Radiology

## 2013-04-12 ENCOUNTER — Inpatient Hospital Stay (HOSPITAL_COMMUNITY): Payer: Medicaid Other

## 2013-04-12 DIAGNOSIS — D631 Anemia in chronic kidney disease: Secondary | ICD-10-CM

## 2013-04-12 DIAGNOSIS — E119 Type 2 diabetes mellitus without complications: Secondary | ICD-10-CM

## 2013-04-12 DIAGNOSIS — N039 Chronic nephritic syndrome with unspecified morphologic changes: Secondary | ICD-10-CM

## 2013-04-12 DIAGNOSIS — I10 Essential (primary) hypertension: Secondary | ICD-10-CM

## 2013-04-12 DIAGNOSIS — N179 Acute kidney failure, unspecified: Secondary | ICD-10-CM

## 2013-04-12 DIAGNOSIS — N189 Chronic kidney disease, unspecified: Secondary | ICD-10-CM

## 2013-04-12 LAB — URINALYSIS, ROUTINE W REFLEX MICROSCOPIC
Ketones, ur: NEGATIVE mg/dL
Leukocytes, UA: NEGATIVE
Nitrite: NEGATIVE
Protein, ur: >300 mg/dL — AB
pH: 5.5 (ref 5.0–8.0)

## 2013-04-12 LAB — COMPREHENSIVE METABOLIC PANEL
ALT: 51 U/L (ref 0–53)
AST: 27 U/L (ref 0–37)
Alkaline Phosphatase: 216 U/L — ABNORMAL HIGH (ref 39–117)
BUN: 73 mg/dL — ABNORMAL HIGH (ref 6–23)
CO2: 19 mEq/L (ref 19–32)
Calcium: 9 mg/dL (ref 8.4–10.5)
Potassium: 3.8 mEq/L (ref 3.5–5.1)
Sodium: 137 mEq/L (ref 135–145)
Total Protein: 7.5 g/dL (ref 6.0–8.3)

## 2013-04-12 LAB — GLUCOSE, CAPILLARY
Glucose-Capillary: 119 mg/dL — ABNORMAL HIGH (ref 70–99)
Glucose-Capillary: 145 mg/dL — ABNORMAL HIGH (ref 70–99)
Glucose-Capillary: 148 mg/dL — ABNORMAL HIGH (ref 70–99)
Glucose-Capillary: 175 mg/dL — ABNORMAL HIGH (ref 70–99)

## 2013-04-12 LAB — CBC
HCT: 25.7 % — ABNORMAL LOW (ref 39.0–52.0)
Hemoglobin: 8.5 g/dL — ABNORMAL LOW (ref 13.0–17.0)
MCH: 22.7 pg — ABNORMAL LOW (ref 26.0–34.0)
MCHC: 33.1 g/dL (ref 30.0–36.0)
RBC: 3.74 MIL/uL — ABNORMAL LOW (ref 4.22–5.81)
RDW: 22.7 % — ABNORMAL HIGH (ref 11.5–15.5)

## 2013-04-12 LAB — MRSA PCR SCREENING: MRSA by PCR: NEGATIVE

## 2013-04-12 LAB — URINE MICROSCOPIC-ADD ON

## 2013-04-12 MED ORDER — INFLUENZA VAC SPLIT QUAD 0.5 ML IM SUSP
0.5000 mL | INTRAMUSCULAR | Status: AC
  Administered 2013-04-13: 0.5 mL via INTRAMUSCULAR
  Filled 2013-04-12 (×2): qty 0.5

## 2013-04-12 MED ORDER — NICARDIPINE HCL IN NACL 40-0.83 MG/200ML-% IV SOLN
7.5000 mg/h | INTRAVENOUS | Status: DC
  Administered 2013-04-12: 10 mg/h via INTRAVENOUS
  Filled 2013-04-12 (×2): qty 200

## 2013-04-12 MED ORDER — SODIUM CHLORIDE 0.9 % IJ SOLN
3.0000 mL | Freq: Two times a day (BID) | INTRAMUSCULAR | Status: DC
  Administered 2013-04-12 – 2013-04-18 (×10): 3 mL via INTRAVENOUS

## 2013-04-12 MED ORDER — FERROUS SULFATE 325 (65 FE) MG PO TABS
325.0000 mg | ORAL_TABLET | Freq: Two times a day (BID) | ORAL | Status: DC
  Administered 2013-04-12 – 2013-04-18 (×13): 325 mg via ORAL
  Filled 2013-04-12 (×16): qty 1

## 2013-04-12 MED ORDER — NICARDIPINE HCL IN NACL 20-0.86 MG/200ML-% IV SOLN
INTRAVENOUS | Status: AC
  Filled 2013-04-12: qty 200

## 2013-04-12 MED ORDER — SODIUM BICARBONATE 650 MG PO TABS
650.0000 mg | ORAL_TABLET | Freq: Two times a day (BID) | ORAL | Status: DC
  Administered 2013-04-12 – 2013-04-19 (×14): 650 mg via ORAL
  Filled 2013-04-12 (×16): qty 1

## 2013-04-12 MED ORDER — ONDANSETRON HCL 4 MG PO TABS: 4.0000 mg | ORAL_TABLET | Freq: Three times a day (TID) | ORAL | Status: DC | PRN

## 2013-04-12 MED ORDER — ONDANSETRON HCL 4 MG/2ML IJ SOLN: 4.0000 mg | Freq: Four times a day (QID) | INTRAMUSCULAR | Status: DC | PRN

## 2013-04-12 MED ORDER — HYDRALAZINE HCL 50 MG PO TABS
100.0000 mg | ORAL_TABLET | Freq: Four times a day (QID) | ORAL | Status: DC
  Administered 2013-04-12 – 2013-04-13 (×6): 100 mg via ORAL
  Filled 2013-04-12 (×10): qty 2

## 2013-04-12 MED ORDER — ISOSORBIDE MONONITRATE ER 30 MG PO TB24
30.0000 mg | ORAL_TABLET | Freq: Every day | ORAL | Status: DC
  Administered 2013-04-12 – 2013-04-13 (×2): 30 mg via ORAL
  Filled 2013-04-12 (×2): qty 1

## 2013-04-12 MED ORDER — NICARDIPINE HCL IN NACL 20-0.86 MG/200ML-% IV SOLN
5.0000 mg/h | Freq: Once | INTRAVENOUS | Status: AC
  Administered 2013-04-12: 5 mg/h via INTRAVENOUS

## 2013-04-12 MED ORDER — LABETALOL HCL 5 MG/ML IV SOLN
10.0000 mg | INTRAVENOUS | Status: DC | PRN
  Administered 2013-04-12 – 2013-04-16 (×9): 10 mg via INTRAVENOUS
  Filled 2013-04-12 (×9): qty 4

## 2013-04-12 MED ORDER — CLONIDINE HCL 0.1 MG PO TABS
0.1000 mg | ORAL_TABLET | Freq: Two times a day (BID) | ORAL | Status: DC
  Administered 2013-04-12 (×2): 0.1 mg via ORAL
  Filled 2013-04-12 (×4): qty 1

## 2013-04-12 MED ORDER — ACETAMINOPHEN 325 MG PO TABS
650.0000 mg | ORAL_TABLET | Freq: Four times a day (QID) | ORAL | Status: DC | PRN
  Administered 2013-04-13 (×2): 650 mg via ORAL
  Filled 2013-04-12 (×2): qty 2

## 2013-04-12 MED ORDER — ACETAMINOPHEN 650 MG RE SUPP: 650.0000 mg | Freq: Four times a day (QID) | RECTAL | Status: DC | PRN

## 2013-04-12 MED ORDER — ONDANSETRON HCL 4 MG PO TABS
4.0000 mg | ORAL_TABLET | Freq: Four times a day (QID) | ORAL | Status: DC | PRN
  Administered 2013-04-12 – 2013-04-13 (×2): 4 mg via ORAL
  Filled 2013-04-12 (×2): qty 1

## 2013-04-12 MED ORDER — NICARDIPINE HCL IN NACL 20-0.86 MG/200ML-% IV SOLN
7.5000 mg/h | INTRAVENOUS | Status: DC
  Administered 2013-04-12: 7.5 mg/h via INTRAVENOUS
  Administered 2013-04-12: 5 mg/h via INTRAVENOUS
  Administered 2013-04-12: 7.5 mg/h via INTRAVENOUS
  Administered 2013-04-12: 10 mg/h via INTRAVENOUS
  Filled 2013-04-12 (×3): qty 200

## 2013-04-12 MED ORDER — HEPARIN SODIUM (PORCINE) 5000 UNIT/ML IJ SOLN
5000.0000 [IU] | Freq: Three times a day (TID) | INTRAMUSCULAR | Status: DC
  Administered 2013-04-12 – 2013-04-19 (×22): 5000 [IU] via SUBCUTANEOUS
  Filled 2013-04-12 (×25): qty 1

## 2013-04-12 MED ORDER — SODIUM CHLORIDE 0.9 % IJ SOLN
3.0000 mL | Freq: Two times a day (BID) | INTRAMUSCULAR | Status: DC
  Administered 2013-04-12 – 2013-04-17 (×6): 3 mL via INTRAVENOUS

## 2013-04-12 MED ORDER — INSULIN ASPART 100 UNIT/ML ~~LOC~~ SOLN
0.0000 [IU] | Freq: Three times a day (TID) | SUBCUTANEOUS | Status: DC
  Administered 2013-04-12 – 2013-04-17 (×6): 1 [IU] via SUBCUTANEOUS
  Administered 2013-04-18: 2 [IU] via SUBCUTANEOUS

## 2013-04-12 MED ORDER — LABETALOL HCL 100 MG PO TABS
100.0000 mg | ORAL_TABLET | Freq: Two times a day (BID) | ORAL | Status: DC
  Administered 2013-04-12 (×3): 100 mg via ORAL
  Filled 2013-04-12 (×6): qty 1

## 2013-04-12 MED ORDER — TRAMADOL HCL 50 MG PO TABS
50.0000 mg | ORAL_TABLET | Freq: Four times a day (QID) | ORAL | Status: DC | PRN
  Administered 2013-04-12 – 2013-04-17 (×4): 50 mg via ORAL
  Filled 2013-04-12 (×4): qty 1

## 2013-04-12 NOTE — ED Notes (Signed)
Called Stepdown about BP after titrating Cardene drip off, stated BP too unstable to transport.  Spoke with Dr. Hal Hope, stated to re-start Cardene at 5 mg/hr at this time

## 2013-04-12 NOTE — Progress Notes (Signed)
[facility] PROGRESS NOTE  Joshua [name] [id number] DOB: December 01, 1980 DOA: 04/11/2013 PCP: Provider Not In System Brief HPI:  Joshua [name] is a 32 y.o. male with history of chronic kidney disease stage IV, hypertension, cardiomyopathy last EF was 30%, diabetes on diet, chronic anemia secondary to chronic kidney disease presented to the ER because of blurred vision headache and nausea vomiting. Patient's symptoms started yesterday morning and became more persistent and at that point patient decided to come to the ER. In the ER CT head was negative for anything acute. Chest x-ray showed improving congestion from previous. Patient pressure was found to be very high and was started on Cardene infusion and has been admitted for hypertensive emergency.  Assessment/Plan: 1. Hypertensive emergency -  Probably secondary to non compliance to medications. He reported that he has been out of town for almost 30months, and hasn't followed up with his appointments  his BP currently is not well controlled and he was restarted back on his cardene. But he is currently asymptomatic, no nausea, vomiting, headache, chest pain or sob.   Continue with other antihypertensives. . Closely follow intake output and metabolic panel.   2. Nausea vomiting with leukocytosis - patient is afebrile. UA is pending. I don't think patient is uremic at this time but we will closely follow metabolic panel. If nausea vomiting continues and if creatinine worsens then at that point may need to be [facility]. Since patient has history of cholelithiasis have ordered sonogram of the abdomen.   3.Chronic kidney disease stage IV - with mild metabolic acidosis and worsening creatinine - patient states he still makes urine. Hold lisinopril due to worsening creatinine. See #1. As per the advice of Dr. Jimmy Footman and also place patient on sodium bicarbonate tablets. Closely follow metabolic panel and intake output. Patient is presently on  IV Lasix.  4.  History of cardiomyopathy with last EF was 30% - closely follow intake output and metabolic panel patient is on Lasix but will hold lisinopril due to worsening creatinine.  5. Chronic anemia probably from kidney disease - closely follow CBG.  6.  Diabetes mellitus type 2 on diet - closely follow CBGs with SSI. CBG (last 3)   Recent Labs  04/11/13 2127 04/12/13 0731  GLUCAP 176* 119*   7 DVT Prophylaxis   Code Status: Full code.  Family Communication: None at bedside Disposition Plan: remain in SDU    Consultants:  Dr deterding.   Procedures:  none  Antibiotics:  none  HPI/Subjective: No headache, no nausea or vomiting abd pain.   Objective: Filed Vitals:   04/12/13 0900  BP: 183/113  Pulse: 82  Temp:   Resp: 14    Intake/Output Summary (Last 24 hours) at 04/12/13 1034 Last data filed at 04/12/13 0900  Gross per 24 hour  Intake 395.42 ml  Output   1700 ml  Net -1304.58 ml   Filed Weights   04/11/13 2123 04/12/13 0215  Weight: 113.399 kg (250 lb) 122.9 kg (270 lb 15.1 oz)    Exam:   General:  Alert afebriel comfortable  Cardiovascular: s1s2  Respiratory: ctab  Abdomen: soft NT ND BS+  Musculoskeletal: MILD EDEMA.  Data Reviewed: Basic Metabolic Panel:  Recent Labs Lab 04/11/13 2200 04/11/13 2210 04/12/13 0333  NA 133* 138 137  K 4.0 3.9 3.8  CL 97 104 100  CO2 17*  --  19  GLUCOSE 167* 162* 147*  BUN 74* 75* 73*  CREATININE 5.63* 6.20* 5.80*  CALCIUM  9.4  --  9.0   Liver Function Tests:  Recent Labs Lab 04/11/13 2200 04/12/13 0333  AST 34 27  ALT 54* 51  ALKPHOS 229* 216*  BILITOT 2.0* 1.7*  PROT 8.1 7.5  ALBUMIN 3.6 3.4*    Recent Labs Lab 04/11/13 2200  LIPASE 21   No results found for this basename: AMMONIA,  in the last 168 hours CBC:  Recent Labs Lab 04/11/13 2200 04/11/13 2210 04/12/13 0333  WBC 15.0*  --  13.3*  NEUTROABS 12.9*  --   --   HGB 8.8* 9.9* 8.5*  HCT 26.6* 29.0*  25.7*  MCV 68.4*  --  68.7*  PLT 271  --  252   Cardiac Enzymes:  Recent Labs Lab 04/11/13 2200  TROPONINI <0.30   BNP (last 3 results)  Recent Labs  01/06/13 1847 01/24/13 0720  PROBNP 61864.0* >70000.0*   CBG:  Recent Labs Lab 04/11/13 2127 04/12/13 0731  GLUCAP 176* 119*    Recent Results (from the past 240 hour(s))  MRSA PCR SCREENING     Status: None   Collection Time    04/12/13  2:26 AM      Result Value Range Status   MRSA by PCR NEGATIVE  NEGATIVE Final   Comment:            The GeneXpert MRSA Assay (FDA     approved for NASAL specimens     only), is one component of a     comprehensive MRSA colonization     surveillance program. It is not     intended to diagnose MRSA     infection nor to guide or     monitor treatment for     MRSA infections.     Studies: Dg Chest 2 View  04/11/2013   CLINICAL DATA:  Chest pain.  EXAM: CHEST  2 VIEW  COMPARISON:  Chest radiograph February 02, 2013  FINDINGS: Cardiac silhouette remains moderately enlarged, mediastinal silhouette is nonsuspicious. Trace central pulmonary vasculature congestion, no pleural effusions or focal consolidations. No pneumothorax.  Multiple EKG lines overlie the patient and may obscure subtle underlying pathology. Larger body habitus. Osseous structures are nonsuspicious.  IMPRESSION: Improved appearance the chest in a stable cardiomegaly with decreased pulmonary edema, trace residual central pulmonary vasculature congestion. Spinal or   Electronically Signed   By: Elon Alas   On: 04/11/2013 22:38   Ct Head Wo Contrast  04/11/2013   CLINICAL DATA:  Posterior headache for 3 days. Chronic kidney disease. Hypertension.  EXAM: CT HEAD WITHOUT CONTRAST  TECHNIQUE: Contiguous axial images were obtained from the base of the skull through the vertex without intravenous contrast.  COMPARISON:  02/02/2013  FINDINGS: The brainstem, cerebellum, cerebral peduncles, thalamus, basal ganglia, basilar  cisterns, and ventricular system appear within normal limits. The subtly accentuated white matter hypodensity in the parietal lobes and potentially in the cerebellum noted, similar to prior exams. No intracranial hemorrhage, mass lesion, or acute CVA.  IMPRESSION: 1. Similar appearance of equivocal white matter hypodensity posteriorly, but given the similar findings for the past 6 months this is likely incidental rather than a manifestation of posterior reversible encephalopathy syndrome. Followup MRI it may provide useful supplementary information, if clinically warranted.   Electronically Signed   By: Sherryl Barters M.D.   On: 04/11/2013 22:41    Scheduled Meds: . cloNIDine  0.1 mg Oral BID  . ferrous sulfate  325 mg Oral BID  . furosemide  80 mg Intravenous Q12H  .  heparin  5,000 Units Subcutaneous Q8H  . hydrALAZINE  100 mg Oral Q6H  . [START ON 04/13/2013] influenza vac split quadrivalent PF  0.5 mL Intramuscular Tomorrow-1000  . insulin aspart  0-9 Units Subcutaneous TID WC  . isosorbide mononitrate  30 mg Oral Daily  . labetalol  100 mg Oral BID  . sodium bicarbonate  650 mg Oral BID  . sodium chloride  3 mL Intravenous Q12H  . sodium chloride  3 mL Intravenous Q12H   Continuous Infusions: . niCARDipine 7.5 mg/hr (04/12/13 0849)    Principal Problem:   Hypertensive emergency Active Problems:   Diabetes   Chronic kidney disease (CKD), stage IV (severe)   Anemia in chronic kidney disease    Time spent: 30 min    Joshua [name]  [facility] Pager [phone] If 7PM-7AM, please contact night-coverage at [email or web address], password [facility] 04/12/2013, 10:34 AM  LOS: 1 day

## 2013-04-12 NOTE — ED Notes (Signed)
Called Stepdown again, stated that they can accept pt as long as they have orders to BP.  Orders have been placed for PO medication.

## 2013-04-12 NOTE — H&P (Addendum)
[facility] History and Physical  Joshua [name] [id number] DOB: [birth year] DOA: 04/11/2013  Referring physician: ER physician. PCP: Provider Not In System   Chief Complaint: Nausea vomiting headache.  HPI: Joshua [name] is a 32 y.o. male with history of chronic kidney disease stage IV, hypertension, cardiomyopathy last EF was 30%, diabetes on diet, chronic anemia secondary to chronic kidney disease presented to the ER because of blurred vision headache and nausea vomiting. Patient's symptoms started yesterday morning and became more persistent and at that point patient decided to come to the ER. In the ER CT head was negative for anything acute. Chest x-ray showed improving congestion from previous. Patient pressure was found to be very high and was started on Cardene infusion and has been admitted for hypertensive emergency. Patient denies having missed his medications and states he was compliant with his medications. Patient otherwise denies any chest pain productive cough fever chills. He has had mild abdominal discomfort pending and nausea and vomiting. Presently denies abdominal pain. He has thrown up twice before admission.   Review of Systems: As presented in the history of presenting illness, rest negative.  Past Medical History  Diagnosis Date  . Arthritis   . CKD (chronic kidney disease) stage 4, GFR 15-29 ml/min   . Hypertension     Hypertensive urgency/emergency - no RAS, normal aldo/PRA ratio,high urine metanephrines and normetanephrines   . Atrial fibrillation   . Type II diabetes mellitus   . History of bronchitis   . Sleep apnea     No CPAP  . Headache(784.0)     Regular  . Anemia of chronic disease    Past Surgical History  Procedure Laterality Date  . No past surgeries     Social History:  reports that he quit smoking about 13 years ago. His smoking use included Cigarettes. He smoked 0.00 packs per day for .1 years. He has never used smokeless tobacco. He  reports that he does not drink alcohol or use illicit drugs. Where does patient live home. Can patient participate in ADLs? Yes.  No Known Allergies  Family History:  Family History  Problem Relation Age of Onset  . Diabetes Father   . Hypertension Father   . Hypertension Maternal Grandmother   . Hypertension Maternal Grandfather   . Kidney disease Maternal Grandfather   . Diabetes Maternal Grandfather       Prior to Admission medications   Medication Sig Start Date End Date Taking? Authorizing Provider  acetaminophen (TYLENOL) 500 MG tablet Take 1,000 mg by mouth every 6 (six) hours as needed. For pain   Yes Historical Provider, MD  aspirin 325 MG tablet Take 325 mg by mouth daily.   Yes Historical Provider, MD  ferrous sulfate 325 (65 FE) MG tablet Take 325 mg by mouth 2 (two) times daily.   Yes Historical Provider, MD  furosemide (LASIX) 80 MG tablet Take 1 tablet (80 mg total) by mouth 2 (two) times daily. 02/07/13  Yes Oswald Hillock, MD  hydrALAZINE (APRESOLINE) 100 MG tablet Take 100 mg by mouth every 6 (six) hours.   Yes Historical Provider, MD  isosorbide mononitrate (IMDUR) 30 MG 24 hr tablet Take 1 tablet (30 mg total) by mouth daily. 02/07/13  Yes Oswald Hillock, MD  lisinopril (PRINIVIL,ZESTRIL) 5 MG tablet Take 1 tablet (5 mg total) by mouth 2 (two) times daily. 02/07/13  Yes Oswald Hillock, MD  metoprolol (LOPRESSOR) 100 MG tablet Take 50 mg by mouth daily.  Yes Historical Provider, MD  ondansetron (ZOFRAN) 4 MG tablet Take 4 mg by mouth every 8 (eight) hours as needed for nausea or vomiting.   Yes Historical Provider, MD  traMADol (ULTRAM) 50 MG tablet Take 50 mg by mouth every 6 (six) hours as needed. For pain   Yes Historical Provider, MD    Physical Exam: Filed Vitals:   04/11/13 2330 04/11/13 2356 04/12/13 0000 04/12/13 0010  BP: 200/123 191/113 185/110 185/116  Pulse: 106   93  Temp:      TempSrc:      Resp: 21  17 18   Height:      Weight:      SpO2: 100%   100%      General:  Well-developed and nourished.  Eyes: Anicteric no pallor.  ENT: No discharge from the ears eyes nose mouth.  Neck: No mass felt.  Cardiovascular: S1-S2 heard.  Respiratory: No rhonchi or crepitations.  Abdomen: Soft nontender bowel sounds present.  Skin: No rash.  Musculoskeletal: Mild edema.  Psychiatric: Appears normal.  Neurologic: Alert awake oriented to time place and person. Moves all extremities 5 x 5.  Labs on Admission:  Basic Metabolic Panel:  Recent Labs Lab 04/11/13 2200 04/11/13 2210  NA 133* 138  K 4.0 3.9  CL 97 104  CO2 17*  --   GLUCOSE 167* 162*  BUN 74* 75*  CREATININE 5.63* 6.20*  CALCIUM 9.4  --    Liver Function Tests:  Recent Labs Lab 04/11/13 2200  AST 34  ALT 54*  ALKPHOS 229*  BILITOT 2.0*  PROT 8.1  ALBUMIN 3.6    Recent Labs Lab 04/11/13 2200  LIPASE 21   No results found for this basename: AMMONIA,  in the last 168 hours CBC:  Recent Labs Lab 04/11/13 2200 04/11/13 2210  WBC 15.0*  --   NEUTROABS 12.9*  --   HGB 8.8* 9.9*  HCT 26.6* 29.0*  MCV 68.4*  --   PLT 271  --    Cardiac Enzymes:  Recent Labs Lab 04/11/13 2200  TROPONINI <0.30    BNP (last 3 results)  Recent Labs  01/06/13 1847 01/24/13 0720  PROBNP 61864.0* >70000.0*   CBG:  Recent Labs Lab 04/11/13 2127  GLUCAP 176*    Radiological Exams on Admission: Dg Chest 2 View  04/11/2013   CLINICAL DATA:  Chest pain.  EXAM: CHEST  2 VIEW  COMPARISON:  Chest radiograph February 02, 2013  FINDINGS: Cardiac silhouette remains moderately enlarged, mediastinal silhouette is nonsuspicious. Trace central pulmonary vasculature congestion, no pleural effusions or focal consolidations. No pneumothorax.  Multiple EKG lines overlie the patient and may obscure subtle underlying pathology. Larger body habitus. Osseous structures are nonsuspicious.  IMPRESSION: Improved appearance the chest in a stable cardiomegaly with decreased  pulmonary edema, trace residual central pulmonary vasculature congestion. Spinal or   Electronically Signed   By: Elon Alas   On: 04/11/2013 22:38   Ct Head Wo Contrast  04/11/2013   CLINICAL DATA:  Posterior headache for 3 days. Chronic kidney disease. Hypertension.  EXAM: CT HEAD WITHOUT CONTRAST  TECHNIQUE: Contiguous axial images were obtained from the base of the skull through the vertex without intravenous contrast.  COMPARISON:  02/02/2013  FINDINGS: The brainstem, cerebellum, cerebral peduncles, thalamus, basal ganglia, basilar cisterns, and ventricular system appear within normal limits. The subtly accentuated white matter hypodensity in the parietal lobes and potentially in the cerebellum noted, similar to prior exams. No intracranial hemorrhage, mass lesion, or  acute CVA.  IMPRESSION: 1. Similar appearance of equivocal white matter hypodensity posteriorly, but given the similar findings for the past 6 months this is likely incidental rather than a manifestation of posterior reversible encephalopathy syndrome. Followup MRI it may provide useful supplementary information, if clinically warranted.   Electronically Signed   By: Sherryl Barters M.D.   On: 04/11/2013 22:41    EKG: Independently reviewed. Sinus tachycardia with QTC of 517.  Assessment/Plan Principal Problem:   Hypertensive emergency Active Problems:   Diabetes   Chronic kidney disease (CKD), stage IV (severe)   Anemia in chronic kidney disease   1. Hypertensive emergency - at this time I have discussed with on-call critical care physician/nephrologist Dr. Jimmy Footman. Dr. Jimmy Footman has advised to wean off Cardene and give clonidine along with labetalol IV when necessary along with IV Lasix. I have ordered one dose of clonidine 0.2 mg along with labetalol IV when necessary every half an hour for systolic blood pressure more than [id number] and diastolic more than [id number] and I have also place patient on labetalol 100 mg by mouth  twice a day and holding off Toprol. Continue with other antihypertensives. If blood pressure does not improve then may have to restart Cardene. Closely follow intake output and metabolic panel. 2. Nausea vomiting with leukocytosis - patient is afebrile. UA is pending. I don't think patient is uremic at this time but we will closely follow metabolic panel. If nausea vomiting continues and if creatinine worsens then at that point may need to be transferred [facility]. Since patient has history of cholelithiasis have ordered sonogram of the abdomen. 3. Chronic kidney disease stage IV - with mild metabolic acidosis and worsening creatinine -  patient states he still makes urine. Hold lisinopril due to worsening creatinine. See #1. As per the advice of Dr. Jimmy Footman and also place patient on sodium bicarbonate tablets. Closely follow metabolic panel and intake output. Patient is presently on IV Lasix. 4. Diabetes mellitus type 2 on diet - closely follow CBGs with sliding-scale coverage. 5. History of cardiomyopathy with last EF was 30% - closely follow intake output and metabolic panel patient is on Lasix but will hold lisinopril due to worsening creatinine. 6. Chronic anemia probably from kidney disease - closely follow CBG.    Code Status: Full code.  Family Communication: None.  Disposition Plan: Admit to inpatient.    Jovian Lembcke N. [facility] Pager [phone].  If 7PM-7AM, please contact night-coverage [email or web address] Password [email or web address] 04/12/2013, 12:14 AM

## 2013-04-12 NOTE — Progress Notes (Signed)
CARE MANAGEMENT NOTE 04/12/2013  Patient:  Joshua [name], Joshua [name]   Account Number:  [id number]  Date Initiated:  04/12/2013  Documentation initiated by:  DAVIS,RHONDA  Subjective/Objective Assessment:   hypertesnive crisis in patient with multiple heath problems     Action/Plan:   home when stable   Anticipated DC Date:  04/15/2013   Anticipated DC Plan:  HOME/SELF CARE  In-house referral  [place]  NA      [facility] Choice  NA   Choice offered to / List presented to:  NA   DME arranged  NA      DME agency  NA     [place] arranged  NA      [place] agency  NA   Status of service:  In process, will continue to follow Medicare Important Message given?  NA - LOS <3 / Initial given by admissions (If response is "NO", the following Medicare IM given date fields will be blank) Date Medicare IM given:   Date Additional Medicare IM given:    Discharge Disposition:    Per UR Regulation:  Reviewed for med. necessity/level of care/duration of stay  If discussed at [place] of Stay Meetings, dates discussed:    Comments:  [id number]/Rhonda Eldridge Dace, BSN, [place] [phone] Chart Reviewed for discharge and hospital needs. Discharge needs at time of review:  None present will follow for needs. Review of patient progress due on [id number].

## 2013-04-13 DIAGNOSIS — N184 Chronic kidney disease, stage 4 (severe): Secondary | ICD-10-CM

## 2013-04-13 DIAGNOSIS — N189 Chronic kidney disease, unspecified: Secondary | ICD-10-CM

## 2013-04-13 DIAGNOSIS — N19 Unspecified kidney failure: Secondary | ICD-10-CM

## 2013-04-13 DIAGNOSIS — D631 Anemia in chronic kidney disease: Secondary | ICD-10-CM

## 2013-04-13 DIAGNOSIS — I5022 Chronic systolic (congestive) heart failure: Secondary | ICD-10-CM

## 2013-04-13 LAB — PHOSPHORUS: Phosphorus: 5.1 mg/dL — ABNORMAL HIGH (ref 2.3–4.6)

## 2013-04-13 LAB — GLUCOSE, CAPILLARY: Glucose-Capillary: 117 mg/dL — ABNORMAL HIGH (ref 70–99)

## 2013-04-13 MED ORDER — LABETALOL HCL 200 MG PO TABS
200.0000 mg | ORAL_TABLET | Freq: Two times a day (BID) | ORAL | Status: DC
  Administered 2013-04-13: 200 mg via ORAL
  Filled 2013-04-13 (×2): qty 1

## 2013-04-13 MED ORDER — CLONIDINE HCL 0.1 MG PO TABS
0.1000 mg | ORAL_TABLET | Freq: Three times a day (TID) | ORAL | Status: DC
  Administered 2013-04-13: 0.1 mg via ORAL
  Filled 2013-04-13 (×3): qty 1

## 2013-04-13 MED ORDER — HYDRALAZINE HCL 50 MG PO TABS
100.0000 mg | ORAL_TABLET | Freq: Three times a day (TID) | ORAL | Status: DC
  Administered 2013-04-13 – 2013-04-19 (×18): 100 mg via ORAL
  Filled 2013-04-13 (×21): qty 2

## 2013-04-13 MED ORDER — METOPROLOL TARTRATE 50 MG PO TABS
50.0000 mg | ORAL_TABLET | Freq: Two times a day (BID) | ORAL | Status: DC
  Administered 2013-04-13 (×2): 50 mg via ORAL
  Filled 2013-04-13 (×4): qty 1

## 2013-04-13 MED ORDER — LISINOPRIL 10 MG PO TABS
10.0000 mg | ORAL_TABLET | Freq: Every day | ORAL | Status: DC
  Administered 2013-04-13: 10 mg via ORAL
  Filled 2013-04-13 (×2): qty 1

## 2013-04-13 MED ORDER — AMLODIPINE BESYLATE 10 MG PO TABS
10.0000 mg | ORAL_TABLET | Freq: Every day | ORAL | Status: DC
  Administered 2013-04-13 – 2013-04-19 (×6): 10 mg via ORAL
  Filled 2013-04-13 (×7): qty 1

## 2013-04-13 NOTE — Progress Notes (Signed)
VASCULAR LAB PRELIMINARY  PRELIMINARY  PRELIMINARY  PRELIMINARY  Right  Upper Extremity Vein Map    Cephalic  Segment Diameter Depth Comment  1. Axilla 1.8 mm mm   2. Mid upper arm 1.7 mm mm   3. Above AC mm mm Too small to measure  4. In AC 2.1 mm mm   5. Below AC mm mm Too small to measure  6. Mid forearm mm mm Too small to measure  7. Wrist mm mm Too small to measure   mm mm    mm mm    mm mm    Basilic  Segment Diameter Depth Comment  1. Mid upper arm  mm mm   2. Above AC 4.1 mm 17.1 mm   3. In AC 1.7 mm 7.8 mm   4. Below AC 1.9 mm 3.9 mm Noncompressible   mm mm    mm mm    mm mm    mm mm    mm mm    mm mm    Left Upper Extremity Vein Map    Cephalic  Segment Diameter Depth Comment  1. Axilla mm mm Too small to measure  2. Mid upper arm 1.9 mm mm   3. Above AC 2.7 mm mm tortuous  4. In [facility] 3 mm mm branch  5. Below AC 1.5 mm mm   6. Mid forearm mm mm Too small to measure  7. Wrist mm mm Too small to measure   mm mm    mm mm    mm mm    Basilic  Segment Diameter Depth Comment  1. Mid upper arm 5.2 mm 22.6 mm Noncompressible  2. Above AC 2.1 mm 17.3 mm   3. In AC 2.1 mm 5.9 mm   4. Below AC 2.1 mm 3.4 mm    mm mm    mm mm    mm mm    mm mm    mm mm    mm mm     Percilla Tweten, RVT 04/13/2013, 11:10 AM

## 2013-04-13 NOTE — Consult Note (Signed)
Renal Service Consult Note [facility]  Amaurion Feith 04/13/2013 [place] D Requesting Physician:  Dr [place] Pink, Nicki [place]  Reason for Consult:  CKD IV patient with n/v, headache and uncontrolled htn HPI: The patient is a 32 y.o. year-old with hx of HTN, chf w reduced EF 30%, and CKD stage IV presented 11/17 with n/v x 2 days, headaches and blurred vision. BP was 240/166 on presentation.  Pt has had 5 admissions this year for HTN'sive crisis and has had PRES at least 1, probably more, of these presentations. He has all his medications and reports compliance, except when vomiting.    Has not been seen by vascular surgery yet, R handed.   Pt was diuresed 7L last 24 hours with iv lasix.  No sob or cough. Cxr showed vasc congestion only.   Home meds :   lisinopril 5 bid  metoprolol 50 daily  hydralazine 100 qid (only taking tid)  lasix 80 bid  isosorbide mononitrate 30 daily     ROS  no abd pain  no jt pain  no skin rash  no focal weakness  applying for disability   no drugs or etoh abuse  Past Medical History  Past Medical History  Diagnosis Date  . Arthritis   . CKD (chronic kidney disease) stage 4, GFR 15-29 ml/min   . Hypertension     Hypertensive urgency/emergency - no RAS, normal aldo/PRA ratio,high urine metanephrines and normetanephrines   . Atrial fibrillation   . Type II diabetes mellitus   . History of bronchitis   . Sleep apnea     No CPAP  . Headache(784.0)     Regular  . Anemia of chronic disease    Past Surgical History  Past Surgical History  Procedure Laterality Date  . No past surgeries     Family History  Family History  Problem Relation Age of Onset  . Diabetes Father   . Hypertension Father   . Hypertension Maternal Grandmother   . Hypertension Maternal Grandfather   . Kidney disease Maternal Grandfather   . Diabetes Maternal Grandfather    Social History  reports that he quit smoking about 13 years ago. His smoking use  included Cigarettes. He smoked 0.00 packs per day for .1 years. He has never used smokeless tobacco. He reports that he does not drink alcohol or use illicit drugs. Allergies No Known Allergies Home medications Prior to Admission medications   Medication Sig Start Date End Date Taking? Authorizing Provider  acetaminophen (TYLENOL) 500 MG tablet Take 1,000 mg by mouth every 6 (six) hours as needed. For pain   Yes Historical Provider, MD  aspirin 325 MG tablet Take 325 mg by mouth daily.   Yes Historical Provider, MD  ferrous sulfate 325 (65 FE) MG tablet Take 325 mg by mouth 2 (two) times daily.   Yes Historical Provider, MD  furosemide (LASIX) 80 MG tablet Take 1 tablet (80 mg total) by mouth 2 (two) times daily. 02/07/13  Yes Oswald Hillock, MD  isosorbide mononitrate (IMDUR) 30 MG 24 hr tablet Take 1 tablet (30 mg total) by mouth daily. 02/07/13  Yes Oswald Hillock, MD  lisinopril (PRINIVIL,ZESTRIL) 5 MG tablet Take 1 tablet (5 mg total) by mouth 2 (two) times daily. 02/07/13  Yes Oswald Hillock, MD  metoprolol (LOPRESSOR) 100 MG tablet Take 50 mg by mouth daily.   Yes Historical Provider, MD  ondansetron (ZOFRAN) 4 MG tablet Take 4 mg by mouth every 8 (eight) hours  as needed for nausea or vomiting.   Yes Historical Provider, MD  traMADol (ULTRAM) 50 MG tablet Take 50 mg by mouth every 6 (six) hours as needed. For pain   Yes Historical Provider, MD  hydrALAZINE (APRESOLINE) 100 MG tablet Take 100 mg by mouth 3 (three) times daily.    Historical Provider, MD   Liver Function Tests  Recent Labs Lab 04/11/13 2200 04/12/13 0333  AST 34 27  ALT 54* 51  ALKPHOS 229* 216*  BILITOT 2.0* 1.7*  PROT 8.1 7.5  ALBUMIN 3.6 3.4*    Recent Labs Lab 04/11/13 2200  LIPASE 21   CBC  Recent Labs Lab 04/11/13 2200 04/11/13 2210 04/12/13 0333  WBC 15.0*  --  13.3*  NEUTROABS 12.9*  --   --   HGB 8.8* 9.9* 8.5*  HCT 26.6* 29.0* 25.7*  MCV 68.4*  --  68.7*  PLT 271  --  [id number]   Basic Metabolic  Panel  Recent Labs Lab 04/11/13 2200 04/11/13 2210 04/12/13 0333  NA 133* 138 137  K 4.0 3.9 3.8  CL 97 104 100  CO2 17*  --  19  GLUCOSE 167* 162* 147*  BUN 74* 75* 73*  CREATININE 5.63* 6.20* 5.80*  CALCIUM 9.4  --  9.0     Exam  Blood pressure 189/102, pulse 88, temperature 97.4 F (36.3 C), temperature source Oral, resp. rate 19, height 5\' 11"  (1.803 m), weight 122.9 kg (270 lb 15.1 oz), SpO2 100.00%.  gen: alert, no distress, sitting up  skin: no rash, cyanosis  heent: eomi, sclera anicteric, throat clear  neck: no jvd, no LAN  chest: clear bilat  cor: regular, no rub , M or gallop, pedal pulses intact  abd: soft, obese, no hsm, no ascites  ext: no LE edema or UE edema, no joint effusion, no gangrene/ulcers  neuro: alert, ox3, nonfocal  Assessment/Plan: 1. CKD stage 4/5- will consult surgeon to see about getting perm access in place while in hospital, pt has transportation problems at home. He can go [facility] to [facility] for access [facility] as needed and return here for medical care 2. HTN'sive crisis, recurrent- compliant with meds, suspect vol control is main problem w advancing CKD; have made some changes to BP regimen, added back norvasc (cheaper than procardia), change hydralazine to tid, stop clonidine (rebound issues), stop imdur (not good bp agent), change labetalol back to metoprolol and increase to 50 bid and change lisinopril to 10 qhs.  Wean off nicardipine drip today to prepare for access procedure 3. Heart failure with reduced EF 4. Anemia of CKD- HG 8-9, low fe sats earlier this year, will repeat and give IV fe as needed, will not start ESA as pt having difficulty still with making op appts, transfuse prn for now 5. Metabolic acidosis- NaHCO3 bid 6. MBD- check phos, Ca 9, pth 170 in July this year   Kelly Splinter MD  pager [phone]    cell [phone]  04/13/2013, 9:49 AM

## 2013-04-13 NOTE — Consult Note (Signed)
[facility] [place]  Referred by:  Roney Jaffe, MD  Reason for referral: New access  History of Present Illness  Joshua [name] is a 32 y.o. ([birth year]) male who presents for evaluation for permanent access.  The patient is right hand dominant.  The patient has not had previous access procedures.  Previous central venous cannulation procedures include: nonw.  The patient has never had a PPM placed. CKD Stage IV-V felt due to DM and poorly controlled HTN.  Past Medical History  Diagnosis Date  . Arthritis   . CKD (chronic kidney disease) stage 4, GFR 15-29 ml/min   . Hypertension     Hypertensive urgency/emergency - no RAS, normal aldo/PRA ratio,high urine metanephrines and normetanephrines   . Atrial fibrillation   . Type II diabetes mellitus   . History of bronchitis   . Sleep apnea     No CPAP  . Headache(784.0)     Regular  . Anemia of chronic disease     Past Surgical History  Procedure Laterality Date  . No past surgeries      History   Social History  . Marital Status: Single    Spouse Name: N/A    Number of Children: N/A  . Years of Education: N/A   Occupational History  . Not on file.   Social History Main Topics  . Smoking status: Former Smoker -- .1 years    Types: Cigarettes    Quit date: 09/24/1999  . Smokeless tobacco: Never Used  . Alcohol Use: No  . Drug Use: No  . Sexual Activity: Yes   Other Topics Concern  . Not on file   Social History Narrative  . No narrative on file    Family History  Problem Relation Age of Onset  . Diabetes Father   . Hypertension Father   . Hypertension Maternal Grandmother   . Hypertension Maternal Grandfather   . Kidney disease Maternal Grandfather   . Diabetes Maternal Grandfather     No current facility-administered medications on file prior to encounter.   Current Outpatient Prescriptions on File Prior to Encounter  Medication Sig Dispense Refill  . aspirin 325 MG tablet Take  325 mg by mouth daily.      . ferrous sulfate 325 (65 FE) MG tablet Take 325 mg by mouth 2 (two) times daily.      . furosemide (LASIX) 80 MG tablet Take 1 tablet (80 mg total) by mouth 2 (two) times daily.  60 tablet  0  . isosorbide mononitrate (IMDUR) 30 MG 24 hr tablet Take 1 tablet (30 mg total) by mouth daily.  30 tablet  2  . lisinopril (PRINIVIL,ZESTRIL) 5 MG tablet Take 1 tablet (5 mg total) by mouth 2 (two) times daily.  60 tablet  2    No Known Allergies  REVIEW OF SYSTEMS:  (Positives checked otherwise negative)  CARDIOVASCULAR:  []  chest pain, []  chest pressure, []  palpitations, [x]  shortness of breath when laying flat, [x]  shortness of breath with exertion,  []  pain in feet when walking, []  pain in feet when laying flat, []  history of blood clot in veins (DVT), []  history of phlebitis, [x]  swelling in legs, []  varicose veins  PULMONARY:  []  productive cough, []  asthma, []  wheezing  NEUROLOGIC:  []  weakness in arms or legs, []  numbness in arms or legs, []  difficulty speaking or slurred speech, []  temporary loss of vision in one eye, []  dizziness  HEMATOLOGIC:  []  bleeding problems, []   problems with blood clotting too easily  MUSCULOSKEL:  []  joint pain, []  joint swelling  GASTROINTEST:  []  vomiting blood, []  blood in stool     GENITOURINARY:  []  burning with urination, []  blood in urine  PSYCHIATRIC:  []  history of major depression  INTEGUMENTARY:  []  rashes, []  ulcers  CONSTITUTIONAL:  []  fever, []  chills  Physical Examination  Filed Vitals:   04/13/13 1300 04/13/13 1400 04/13/13 1500 04/13/13 1600  BP: 166/105 195/126 168/110 158/101  Pulse: 76 82 75 75  Temp:    97.8 F (36.6 C)  TempSrc:    Oral  Resp: 10 16 14 11   Height:      Weight:      SpO2: 100% 100% 100% 100%   Body mass index is 37.81 kg/(m^2).  General: A&O x 3, WD, obese  Head: [place]/AT  Ear/Nose/Throat: Hearing grossly intact, nares w/o erythema or drainage, oropharynx w/o Erythema/Exudate,  Mallampati score: 3  Eyes: PERRLA, EOMI  Neck: Supple, no nuchal rigidity, no palpable LAD  Pulmonary: Sym exp, good air movt, CTAB, no rales, rhonchi, & wheezing  Cardiac: RRR, Nl S1, S2, no Murmurs, rubs or gallops  Vascular: Vessel Right Left  Radial Palpable Palpable  Ulnar Faintly Palpable Faintly Palpable  Brachial Palpable Palpable  Carotid Palpable, without bruit Palpable, without bruit  Aorta Not palpable N/A  Femoral Palpable Palpable  Popliteal Not palpable Not palpable  PT Palpable Palpable  DP Palpable Palpable   Gastrointestinal: soft, NTND, -G/R, - HSM, - masses, - CVAT B  Musculoskeletal: M/S 5/5 throughout , Extremities without ischemic changes   Neurologic: CN 2-12 intact , Pain and light touch intact in extremities , Motor exam as listed above  Psychiatric: Judgment intact, Mood & affect appropriate for pt's clinical situation  Dermatologic: See M/S exam for extremity exam, no rashes otherwise noted  Lymph : No Cervical, Axillary, or Inguinal lymphadenopathy   Laboratory: CBC:    Component Value Date/Time   WBC 13.3* 04/12/2013 0333   RBC 3.74* 04/12/2013 0333   RBC 4.40 09/26/2012 1250   HGB 8.5* 04/12/2013 0333   HCT 25.7* 04/12/2013 0333   PLT 252 04/12/2013 0333   MCV 68.7* 04/12/2013 0333   MCH 22.7* 04/12/2013 0333   MCHC 33.1 04/12/2013 0333   RDW 22.7* 04/12/2013 0333   LYMPHSABS 1.5 04/11/2013 2200   MONOABS 0.6 04/11/2013 2200   EOSABS 0.0 04/11/2013 2200   BASOSABS 0.0 04/11/2013 2200    BMP:    Component Value Date/Time   NA 137 04/12/2013 0333   NA 135* 07/29/2012   K 3.8 04/12/2013 0333   CL 100 04/12/2013 0333   CO2 19 04/12/2013 0333   GLUCOSE 147* 04/12/2013 0333   BUN 73* 04/12/2013 0333   BUN 3* 07/29/2012   CREATININE 5.80* 04/12/2013 0333   CREATININE 3.41* 09/02/2012 1734   CREATININE 4.0* 07/29/2012   CALCIUM 9.0 04/12/2013 0333   GFRNONAA 12* 04/12/2013 0333   GFRAA 14* 04/12/2013 0333    Coagulation: Lab  Results  Component Value Date   INR 1.27 01/25/2013   No results found for this basename: PTT     Non-Invasive Vascular Imaging  Vein Mapping  (Date: 04/13/2013):   R arm: acceptable vein conduits include none  L arm: acceptable vein conduits include none  Report finalized by myself.  Medical Decision Making  Joshua [name] is a 32 y.o. male who presents with CKD Stage IV, Hypertensive emergency, DM, CHF   Based on vein mapping and  examination, this patient's permanent access options include: L arm AVG vs brachial vein transposition.  Procedure dependent on intraoperative findings.  Pt may need tunneled dialysis catheter placement as bridge to permanent access if his renal function deteriorates  The patient has NOT decided on whether or not to proceed with hemodialysis, so he is not ready to decided on permanent access placement.  Let us known when and if the patient is willing to proceed with access placement and we will arrange for the procedure.  Further titration of HTN recommended as any procedure likely to be canceled by anesthesia if BP poorly controlled.  Adele Barthel, MD [facility] [place] Office: [phone] Pager: [phone]  04/13/2013, 5:19 PM

## 2013-04-13 NOTE — Progress Notes (Signed)
[facility] PROGRESS NOTE  Garbriel Heber [id number] DOB: [birth year] DOA: 04/11/2013  PCP: No PCP per patient  Brief HPI: Delfred Vise is a 32 y.o. male with history of chronic kidney disease stage IV, hypertension, cardiomyopathy last EF was 30%, diabetes on diet, chronic anemia secondary to chronic kidney disease presented to the ER because of blurred vision headache and nausea vomiting. In the ER CT head was negative for acute findings. Chest x-ray showed improving congestion from previous. Patient's blood pressure was found to be very high and was started on Cardene infusion and has been admitted for hypertensive emergency.  Past medical history:  Past Medical History  Diagnosis Date  . Arthritis   . CKD (chronic kidney disease) stage 4, GFR 15-29 ml/min   . Hypertension     Hypertensive urgency/emergency - no RAS, normal aldo/PRA ratio,high urine metanephrines and normetanephrines   . Atrial fibrillation   . Type II diabetes mellitus   . History of bronchitis   . Sleep apnea     No CPAP  . Headache(784.0)     Regular  . Anemia of chronic disease     Consultants: Nephrology  Procedures: None  Antibiotics: None  Subjective: Patient feels better. Headache has resolved. Denies any chest pain or shortness of breath. Denies non compliance.  Objective: Vital Signs  Filed Vitals:   04/13/13 0630 04/13/13 0700 04/13/13 0715 04/13/13 0730  BP: 183/101 183/98 190/99 189/102  Pulse: 90 88 89 88  Temp:      TempSrc:      Resp: 21 23 19 19   Height:      Weight:      SpO2: 100% 100% 100% 100%    Filed Weights   04/11/13 2123 04/12/13 0215  Weight: 113.399 kg (250 lb) 122.9 kg (270 lb 15.1 oz)   Intake/Output from previous day: 11/18 0701 - 11/19 0700 In: 2408.8 [P.O.:1440; I.V.:968.8] Out: 7900 [Urine:7900]  General appearance: alert, cooperative, appears stated age and no distress Head: Normocephalic, without obvious abnormality, atraumatic Resp: clear to  auscultation bilaterally Cardio: regular rate and rhythm, S1, S2 normal, no murmur, click, rub or gallop GI: soft, non-tender; bowel sounds normal; no masses,  no organomegaly Extremities: extremities normal, atraumatic, no cyanosis or edema Neurologic: Alert and oriented x 3. No focal deficits.  Lab Results:  Basic Metabolic Panel:  Recent Labs Lab 04/11/13 2200 04/11/13 2210 04/12/13 0333  NA 133* 138 137  K 4.0 3.9 3.8  CL 97 104 100  CO2 17*  --  19  GLUCOSE 167* 162* 147*  BUN 74* 75* 73*  CREATININE 5.63* 6.20* 5.80*  CALCIUM 9.4  --  9.0   Liver Function Tests:  Recent Labs Lab 04/11/13 2200 04/12/13 0333  AST 34 27  ALT 54* 51  ALKPHOS 229* 216*  BILITOT 2.0* 1.7*  PROT 8.1 7.5  ALBUMIN 3.6 3.4*    Recent Labs Lab 04/11/13 2200  LIPASE 21   CBC:  Recent Labs Lab 04/11/13 2200 04/11/13 2210 04/12/13 0333  WBC 15.0*  --  13.3*  NEUTROABS 12.9*  --   --   HGB 8.8* 9.9* 8.5*  HCT 26.6* 29.0* 25.7*  MCV 68.4*  --  68.7*  PLT 271  --  252   Cardiac Enzymes:  Recent Labs Lab 04/11/13 2200  TROPONINI <0.30   BNP (last 3 results)  Recent Labs  01/06/13 1847 01/24/13 0720  PROBNP 61864.0* >70000.0*   CBG:  Recent Labs Lab 04/11/13 2127 04/12/13 0731 04/12/13 1223  04/12/13 1714 04/12/13 2206  GLUCAP 176* 119* 145* 148* 175*    Recent Results (from the past 240 hour(s))  MRSA PCR SCREENING     Status: None   Collection Time    04/12/13  2:26 AM      Result Value Range Status   MRSA by PCR NEGATIVE  NEGATIVE Final   Comment:            The GeneXpert MRSA Assay (FDA     approved for NASAL specimens     only), is one component of a     comprehensive MRSA colonization     surveillance program. It is not     intended to diagnose MRSA     infection nor to guide or     monitor treatment for     MRSA infections.      Studies/Results: Dg Chest 2 View  04/11/2013   CLINICAL DATA:  Chest pain.  EXAM: CHEST  2 VIEW  COMPARISON:   Chest radiograph February 02, 2013  FINDINGS: Cardiac silhouette remains moderately enlarged, mediastinal silhouette is nonsuspicious. Trace central pulmonary vasculature congestion, no pleural effusions or focal consolidations. No pneumothorax.  Multiple EKG lines overlie the patient and may obscure subtle underlying pathology. Larger body habitus. Osseous structures are nonsuspicious.  IMPRESSION: Improved appearance the chest in a stable cardiomegaly with decreased pulmonary edema, trace residual central pulmonary vasculature congestion. Spinal or   Electronically Signed   By: Elon Alas   On: 04/11/2013 22:38   Ct Head Wo Contrast  04/11/2013   CLINICAL DATA:  Posterior headache for 3 days. Chronic kidney disease. Hypertension.  EXAM: CT HEAD WITHOUT CONTRAST  TECHNIQUE: Contiguous axial images were obtained from the base of the skull through the vertex without intravenous contrast.  COMPARISON:  02/02/2013  FINDINGS: The brainstem, cerebellum, cerebral peduncles, thalamus, basal ganglia, basilar cisterns, and ventricular system appear within normal limits. The subtly accentuated white matter hypodensity in the parietal lobes and potentially in the cerebellum noted, similar to prior exams. No intracranial hemorrhage, mass lesion, or acute CVA.  IMPRESSION: 1. Similar appearance of equivocal white matter hypodensity posteriorly, but given the similar findings for the past 6 months this is likely incidental rather than a manifestation of posterior reversible encephalopathy syndrome. Followup MRI it may provide useful supplementary information, if clinically warranted.   Electronically Signed   By: Sherryl Barters M.D.   On: 04/11/2013 22:41   US Abdomen Complete  04/12/2013   CLINICAL DATA:  Nausea, vomiting.  EXAM: ULTRASOUND ABDOMEN COMPLETE  COMPARISON:  02/02/2013.  FINDINGS: Gallbladder  There are multiple cholelithiasis. The gallbladder wall is top normal in thickness measuring 3.4 mm, but  the gallbladder is contracted which limits evaluation. There is no pericholecystic fluid. Negative sonographic Murphy sign.  Common bile duct  Diameter: 4.9 mm.  Liver  No focal lesion. The liver is increased in echogenicity as can be seen with hepatic steatosis. The liver is mildly enlarged measuring 19 cm.  IVC  No abnormality visualized.  Pancreas  Visualized portion unremarkable.  Spleen  No focal abnormality.  The spleen is enlarged measuring 13.7 cm.  Right Kidney  Length: 11.4 cm. Echogenicity within normal limits. No mass or hydronephrosis visualized.  Left Kidney  Length: 11 cm. Echogenicity within normal limits. No mass or hydronephrosis visualized.  Abdominal aorta  No aneurysm visualized.  IMPRESSION: 1. Cholelithiasis within a relatively contracted gallbladder. The gallbladder wall is top-normal in thickness likely secondary to a contracted state. There  are no other sonographic findings to suggest cholecystitis.  2.  Hepatic steatosis.  3.  Mild hepatomegaly.  4.  Mild splenomegaly.   Electronically Signed   By: Kathreen Devoid   On: 04/12/2013 14:09    Medications:  Scheduled: . cloNIDine  0.1 mg Oral TID  . ferrous sulfate  325 mg Oral BID  . furosemide  80 mg Intravenous Q12H  . heparin  5,000 Units Subcutaneous Q8H  . hydrALAZINE  100 mg Oral Q6H  . influenza vac split quadrivalent PF  0.5 mL Intramuscular Tomorrow-1000  . insulin aspart  0-9 Units Subcutaneous TID WC  . isosorbide mononitrate  30 mg Oral Daily  . labetalol  200 mg Oral BID  . sodium bicarbonate  650 mg Oral BID  . sodium chloride  3 mL Intravenous Q12H  . sodium chloride  3 mL Intravenous Q12H   Continuous: . niCARDipine 5 mg/hr (04/13/13 0545)   [id number], acetaminophen, labetalol, ondansetron (ZOFRAN) IV, ondansetron, traMADol  Assessment/Plan:  Principal Problem:   Hypertensive emergency Active Problems:   Diabetes   Chronic kidney disease (CKD), stage IV (severe)   Anemia in chronic kidney  disease    Hypertensive emergency Probably secondary to non compliance to medications. BP better but remains high and he continues to be on Cardene infusion. Will increase dose of Labetalol and Clonidine. HR should tolerate this increase. Headache has resolved. Hopefully we will be able to wean him off of Cardene today. Continue Hydralazine and Isosorbide. Can increase dose of Isosorbide as well if needed.  Nausea and vomiting with leukocytosis Patient is afebrile. UA shows no infection. US abdomen shows cholelithiasis but no evidence for cholecystitis. Now better. Was likely secondary to elevated BP.   Acute on Chronic kidney disease stage IV with mild metabolic acidosis Creatinine much higher than his baseline which is around 4-5. Will have nephrology see him. On IV lasix with good diuresis. Will let Nephrology address diuresis. Good UO noted. On Bicarb tablets.   History of cardiomyopathy with last EF was 30% Appears to be well compensated. Not much pedal edema. Closely follow intake output and metabolic panel. Patient is on Lasix but holding lisinopril due to worsening creatinine.   Chronic anemia probably from kidney disease Stable. Closely follow Hgb  Diabetes mellitus type 2 on diet control Stable  DVT Prophylaxis: Heparin Code Status: Full code.  Family Communication: Discussed with patient Disposition Plan: Remain in SDU while on Cardene infusion     LOS: 2 days   [facility] Pager [phone] 04/13/2013, 8:18 AM  If 8PM-8AM, please contact night-coverage at [email or web address], password [facility]

## 2013-04-14 ENCOUNTER — Encounter (HOSPITAL_COMMUNITY): Payer: Self-pay | Admitting: *Deleted

## 2013-04-14 DIAGNOSIS — K8020 Calculus of gallbladder without cholecystitis without obstruction: Secondary | ICD-10-CM

## 2013-04-14 DIAGNOSIS — I10 Essential (primary) hypertension: Secondary | ICD-10-CM

## 2013-04-14 LAB — CBC
Hemoglobin: 8.5 g/dL — ABNORMAL LOW (ref 13.0–17.0)
MCH: 23 pg — ABNORMAL LOW (ref 26.0–34.0)
MCV: 68.9 fL — ABNORMAL LOW (ref 78.0–100.0)
Platelets: 238 10*3/uL (ref 150–400)
RBC: 3.7 MIL/uL — ABNORMAL LOW (ref 4.22–5.81)
WBC: 13.4 10*3/uL — ABNORMAL HIGH (ref 4.0–10.5)

## 2013-04-14 LAB — IRON AND TIBC
Iron: 46 ug/dL (ref 42–135)
Saturation Ratios: 16 % — ABNORMAL LOW (ref 20–55)
TIBC: 281 ug/dL (ref 215–435)
UIBC: 235 ug/dL (ref 125–400)

## 2013-04-14 LAB — GLUCOSE, CAPILLARY
Glucose-Capillary: 114 mg/dL — ABNORMAL HIGH (ref 70–99)
Glucose-Capillary: 112 mg/dL — ABNORMAL HIGH (ref 70–99)

## 2013-04-14 LAB — BASIC METABOLIC PANEL
CO2: 21 mEq/L (ref 19–32)
Chloride: 96 mEq/L (ref 96–112)
Creatinine, Ser: 5.93 mg/dL — ABNORMAL HIGH (ref 0.50–1.35)
GFR calc Af Amer: 13 mL/min — ABNORMAL LOW (ref >90)
Glucose, Bld: 117 mg/dL — ABNORMAL HIGH (ref 70–99)

## 2013-04-14 LAB — FERRITIN: Ferritin: 52 ng/mL (ref 22–322)

## 2013-04-14 MED ORDER — DEXTROSE 5 % IV SOLN
1.5000 g | INTRAVENOUS | Status: DC
  Filled 2013-04-14: qty 1.5

## 2013-04-14 MED ORDER — FUROSEMIDE 80 MG PO TABS
160.0000 mg | ORAL_TABLET | Freq: Every morning | ORAL | Status: DC
  Filled 2013-04-14 (×2): qty 2

## 2013-04-14 MED ORDER — FUROSEMIDE 80 MG PO TABS
80.0000 mg | ORAL_TABLET | Freq: Every evening | ORAL | Status: DC
  Filled 2013-04-14: qty 1

## 2013-04-14 MED ORDER — DEXTROSE 5 % IV SOLN
1.5000 g | INTRAVENOUS | Status: AC
  Administered 2013-04-18: 1.5 g via INTRAVENOUS
  Filled 2013-04-14: qty 1.5

## 2013-04-14 MED ORDER — METOPROLOL TARTRATE 50 MG PO TABS
75.0000 mg | ORAL_TABLET | Freq: Two times a day (BID) | ORAL | Status: DC
  Administered 2013-04-14 (×2): 75 mg via ORAL
  Filled 2013-04-14 (×4): qty 1

## 2013-04-14 MED ORDER — LISINOPRIL 20 MG PO TABS
20.0000 mg | ORAL_TABLET | Freq: Every day | ORAL | Status: DC
Start: 2013-04-14 — End: 2013-04-15
  Administered 2013-04-14: 20 mg via ORAL
  Filled 2013-04-14 (×2): qty 1

## 2013-04-14 MED ORDER — FUROSEMIDE 80 MG PO TABS
160.0000 mg | ORAL_TABLET | Freq: Two times a day (BID) | ORAL | Status: DC
  Administered 2013-04-14 – 2013-04-19 (×10): 160 mg via ORAL
  Filled 2013-04-14 (×13): qty 2

## 2013-04-14 NOTE — Progress Notes (Signed)
Report called to [place], Therapist, sports.  Patient transferring to room 1405 via wheelchair.  Patient is alert and oriented.

## 2013-04-14 NOTE — Plan of Care (Signed)
Problem: Phase I Progression Outcomes Goal: Hemodynamically stable Outcome: Progressing Continued, chronic hypertension.

## 2013-04-14 NOTE — Progress Notes (Signed)
Paged Dr. Bridgett Larsson at pager number [phone] after getting number from Vascular surgery to clarify transfer orders.  No call received.  Paged [phone] again, no call received to verify transfer orders.  Notified Dr. Curly Rim whom had requested that Dr. Bridgett Larsson be contacted that Dr. Bridgett Larsson has been unavailable by pager.  Called the vascular surgery center and spoke to nurse Arbie Cookey. She is personally paging Dr. Bridgett Larsson.  Await response.

## 2013-04-14 NOTE — Progress Notes (Signed)
[facility] PROGRESS NOTE  Joshua [name] [id number] DOB: 1981/03/29 DOA: 04/11/2013  PCP: No PCP per patient  Brief HPI: Joshua [name] is a 32 y.o. male with history of chronic kidney disease stage IV, hypertension, cardiomyopathy last EF was 30%, diabetes on diet, chronic anemia secondary to chronic kidney disease presented to the ER because of blurred vision headache and nausea vomiting. In the ER CT head was negative for acute findings. Chest x-ray showed improving congestion from previous. Patient's blood pressure was found to be very high and was started on Cardene infusion and has been admitted for hypertensive emergency.  Past medical history:  Past Medical History  Diagnosis Date  . Arthritis   . CKD (chronic kidney disease) stage 4, GFR 15-29 ml/min   . Hypertension     Hypertensive urgency/emergency - no RAS, normal aldo/PRA ratio,high urine metanephrines and normetanephrines   . Atrial fibrillation   . Type II diabetes mellitus   . History of bronchitis   . Sleep apnea     No CPAP  . Headache(784.0)     Regular  . Anemia of chronic disease     Consultants: Nephrology  Procedures: None  Antibiotics: None  Subjective: Patient continues to feel fine. Occasional nausea but no vomiting. No headaches. Denies any chest pain or shortness of breath.  Objective: Vital Signs  Filed Vitals:   04/14/13 0445 04/14/13 0500 04/14/13 0604 04/14/13 0700  BP:  164/90 183/102 171/116  Pulse: 74 73  79  Temp:      TempSrc:      Resp: 15 10  22   Height:      Weight:      SpO2: 100% 100%  100%    Filed Weights   04/11/13 2123 04/12/13 0215  Weight: 113.399 kg (250 lb) 122.9 kg (270 lb 15.1 oz)   Intake/Output from previous day: 11/19 0701 - 11/20 0700 In: 1492.5 [P.O.:1260; I.V.:232.5] Out: 3205 [id number]  General appearance: alert, cooperative, appears stated age and no distress Head: Normocephalic, without obvious abnormality, atraumatic Resp: clear to  auscultation bilaterally Cardio: regular rate and rhythm, S1, S2 normal, no murmur, click, rub or gallop GI: soft, non-tender; bowel sounds normal; no masses,  no organomegaly Extremities: extremities normal, atraumatic, no cyanosis or edema Neurologic: Alert and oriented x 3. No focal deficits.  Lab Results:  Basic Metabolic Panel:  Recent Labs Lab 04/11/13 2200 04/11/13 2210 04/12/13 0333 04/13/13 1016 04/14/13 0326  NA 133* 138 137  --  134*  K 4.0 3.9 3.8  --  3.5  CL 97 104 100  --  96  CO2 17*  --  19  --  21  GLUCOSE 167* 162* 147*  --  117*  BUN 74* 75* 73*  --  72*  CREATININE 5.63* 6.20* 5.80*  --  5.93*  CALCIUM 9.4  --  9.0  --  7.9*  PHOS  --   --   --  5.1*  --    Liver Function Tests:  Recent Labs Lab 04/11/13 2200 04/12/13 0333  AST 34 27  ALT 54* 51  ALKPHOS 229* 216*  BILITOT 2.0* 1.7*  PROT 8.1 7.5  ALBUMIN 3.6 3.4*    Recent Labs Lab 04/11/13 2200  LIPASE 21   CBC:  Recent Labs Lab 04/11/13 2200 04/11/13 2210 04/12/13 0333 04/14/13 0326  WBC 15.0*  --  13.3* 13.4*  NEUTROABS 12.9*  --   --   --   HGB 8.8* 9.9* 8.5* 8.5*  HCT  26.6* 29.0* 25.7* 25.5*  MCV 68.4*  --  68.7* 68.9*  PLT 271  --  252 238   Cardiac Enzymes:  Recent Labs Lab 04/11/13 2200  TROPONINI <0.30   BNP (last 3 results)  Recent Labs  01/06/13 1847 01/24/13 0720  PROBNP 61864.0* >70000.0*   CBG:  Recent Labs Lab 04/12/13 2206 04/13/13 0743 04/13/13 1155 04/13/13 1637 04/13/13 2225  GLUCAP 175* 123* 150* 117* 120*    Recent Results (from the past 240 hour(s))  MRSA PCR SCREENING     Status: None   Collection Time    04/12/13  2:26 AM      Result Value Range Status   MRSA by PCR NEGATIVE  NEGATIVE Final   Comment:            The GeneXpert MRSA Assay (FDA     approved for NASAL specimens     only), is one component of a     comprehensive MRSA colonization     surveillance program. It is not     intended to diagnose MRSA     infection  nor to guide or     monitor treatment for     MRSA infections.      Studies/Results: US Abdomen Complete  04/12/2013   CLINICAL DATA:  Nausea, vomiting.  EXAM: ULTRASOUND ABDOMEN COMPLETE  COMPARISON:  02/02/2013.  FINDINGS: Gallbladder  There are multiple cholelithiasis. The gallbladder wall is top normal in thickness measuring 3.4 mm, but the gallbladder is contracted which limits evaluation. There is no pericholecystic fluid. Negative sonographic Murphy sign.  Common bile duct  Diameter: 4.9 mm.  Liver  No focal lesion. The liver is increased in echogenicity as can be seen with hepatic steatosis. The liver is mildly enlarged measuring 19 cm.  IVC  No abnormality visualized.  Pancreas  Visualized portion unremarkable.  Spleen  No focal abnormality.  The spleen is enlarged measuring 13.7 cm.  Right Kidney  Length: 11.4 cm. Echogenicity within normal limits. No mass or hydronephrosis visualized.  Left Kidney  Length: 11 cm. Echogenicity within normal limits. No mass or hydronephrosis visualized.  Abdominal aorta  No aneurysm visualized.  IMPRESSION: 1. Cholelithiasis within a relatively contracted gallbladder. The gallbladder wall is top-normal in thickness likely secondary to a contracted state. There are no other sonographic findings to suggest cholecystitis.  2.  Hepatic steatosis.  3.  Mild hepatomegaly.  4.  Mild splenomegaly.   Electronically Signed   By: Kathreen Devoid   On: 04/12/2013 14:09    Medications:  Scheduled: . amLODipine  10 mg Oral Daily  . ferrous sulfate  325 mg Oral BID  . furosemide  160 mg Oral q morning - 10a  . furosemide  80 mg Oral QPM  . heparin  5,000 Units Subcutaneous Q8H  . hydrALAZINE  100 mg Oral Q8H  . insulin aspart  0-9 Units Subcutaneous TID WC  . lisinopril  20 mg Oral QHS  . metoprolol tartrate  75 mg Oral BID  . sodium bicarbonate  650 mg Oral BID  . sodium chloride  3 mL Intravenous Q12H  . sodium chloride  3 mL Intravenous Q12H   Continuous:    [id number], acetaminophen, labetalol, ondansetron (ZOFRAN) IV, ondansetron, traMADol  Assessment/Plan:  Principal Problem:   Hypertensive emergency Active Problems:   Diabetes   Chronic kidney disease (CKD), stage IV (severe)   Anemia in chronic kidney disease    Hypertensive emergency/Accelerated HTN HTN emergency has resolved. BP however remains  high. Probably secondary to non compliance to medications. He is now off of Cardene since yesterday AM. Nephrologist changed his anti-hypertensives. Will increase dose of Metoprolol. Continue Hydralazine and Amlodipine and ACEI. BP will likely take weeks to stabilize. He doesn't have symptoms suggestive of acute end organ involvement.  Nausea and vomiting with leukocytosis Patient is afebrile. UA shows no infection. US abdomen shows cholelithiasis but no evidence for cholecystitis. Now better. Nausea was likely secondary to elevated BP.   Acute on Chronic kidney disease stage IV with mild metabolic acidosis Creatinine much higher than his baseline which is around 4-5. Appreciate Nephrology input. Plan is to initiate dialysis in near future. Will change to oral Lasix. On Bicarb tablets.   History of cardiomyopathy with last EF was 30% Appears to be well compensated. Not much pedal edema. Closely follow intake output and metabolic panel. Patient is on Lasix. ACEI resumed yesterday.   Chronic anemia probably from kidney disease Stable. Closely follow Hgb  Diabetes mellitus type 2 on diet control Stable  DVT Prophylaxis: Heparin Code Status: Full code.  Family Communication: Discussed with patient Disposition Plan: Transfer to [facility]. High BP should not preclude this transfer. His BP will take a while to stabilize. Anticipate discharge in AM.     LOS: 3 days   [place] [facility] Pager [phone] 04/14/2013, 7:29 AM  If 8PM-8AM, please contact night-coverage at [email or web address], password [facility]

## 2013-04-14 NOTE — Progress Notes (Signed)
CARE MANAGEMENT NOTE 04/14/2013  Patient:  Joshua [name], Joshua [name]   Account Number:  [id number]  Date Initiated:  04/12/2013  Documentation initiated by:  DAVIS,RHONDA  Subjective/Objective Assessment:   hypertesnive crisis in patient with multiple heath problems     Action/Plan:   home when stable   Anticipated DC Date:  04/15/2013   Anticipated DC Plan:  HOME/SELF CARE  In-house referral  [place]  CM consult  Follow-up appt scheduled  MATCH Program      [facility] Choice  NA   Choice offered to / List presented to:  NA   DME arranged  NA      DME agency  NA     [place] arranged  NA      [place] agency  NA   Status of service:  In process, will continue to follow Medicare Important Message given?  NA - LOS <3 / Initial given by admissions (If response is "NO", the following Medicare IM given date fields will be blank) Date Medicare IM given:   Date Additional Medicare IM given:    Discharge Disposition:    Per UR Regulation:  Reviewed for med. necessity/level of care/duration of stay  If discussed at [place] of Stay Meetings, dates discussed:    Comments:  [id number]/Rhonda Davis,RN,BSN,CCM: email sent to the [facility] for appt to be schedule for mid Dec,2014 once plan of care is established.  Patient will need match program at time of discharge.  [id number] Rosana Hoes, RN, BSN, [place] [phone] Chart Reviewed for discharge and hospital needs. Discharge needs at time of review:  None present will follow for needs. Review of patient progress due on [id number].

## 2013-04-14 NOTE — Progress Notes (Signed)
Discussed with pt's primary nephrologist, pt never makes op appts and needs perm access prior to discharge, I have d/w patient and he is agreeable to proceed.  [facility] updated and will try to get him on schedule as soon as possible. Have d/w primary also.   Kelly Splinter MD  pager [phone]    cell [phone]  04/14/2013, 10:57 AM

## 2013-04-14 NOTE — Progress Notes (Signed)
   Pt now willing to proceed with L arm AVG placement.  Unfortunately, Friday has already been booked so the earliest it could be done is Monday at the [facility].  Dr. Oneida Alar will perform the L arm AVG, as I will be off next week.  Preop orders entered.  Adele Barthel, MD [facility] [place] Office: [phone] Pager: [phone]  04/14/2013, 11:01 AM

## 2013-04-14 NOTE — Progress Notes (Signed)
  [facility] Progress Note    Subjective: BP too high for access placement right now, pt w/o complaints   Exam  Blood pressure 171/116, pulse 79, temperature 97.8 F (36.6 C), temperature source Oral, resp. rate 22, height 5\' 11"  (1.803 m), weight 122.9 kg (270 lb 15.1 oz), SpO2 100.00%. gen: alert, no distress, sitting up  neck: no jvd, no LAN  chest: clear bilat  cor: regular, no rub , M or gallop, pedal pulses intact  abd: soft, obese, no hsm, no ascites  ext: no LE edema or UE edema neuro: alert, ox3, nonfocal   Assessment/Plan:  1. CKD stage 4/5- will schedule f/u at [facility] and with [facility] op 2. HTN'sive crisis, recurrent- good diureses last 48hrs, change lasix to po and increase to 160 bid, have d/w primary, ^'d metoprolol and lisinopril 3. Heart failure with reduced EF 30% 4. Anemia of CKD- HG 8-9, low fe sats earlier this year, repeat pending 5. Metabolic acidosis- NaHCO3 bid 6. MBD- phos 5.1, Ca 9, pth 170 in July this year 7. Dispo- when bp under better control   Kelly Splinter MD  pager [phone]    cell [phone]  04/14/2013, 7:25 AM   Recent Labs Lab 04/11/13 2200 04/11/13 2210 04/12/13 0333 04/13/13 1016 04/14/13 0326  NA 133* 138 137  --  134*  K 4.0 3.9 3.8  --  3.5  CL 97 104 100  --  96  CO2 17*  --  19  --  21  GLUCOSE 167* 162* 147*  --  117*  BUN 74* 75* 73*  --  72*  CREATININE 5.63* 6.20* 5.80*  --  5.93*  CALCIUM 9.4  --  9.0  --  7.9*  PHOS  --   --   --  5.1*  --     Recent Labs Lab 04/11/13 2200 04/12/13 0333  AST 34 27  ALT 54* 51  ALKPHOS 229* 216*  BILITOT 2.0* 1.7*  PROT 8.1 7.5  ALBUMIN 3.6 3.4*    Recent Labs Lab 04/11/13 2200 04/11/13 2210 04/12/13 0333 04/14/13 0326  WBC 15.0*  --  13.3* 13.4*  NEUTROABS 12.9*  --   --   --   HGB 8.8* 9.9* 8.5* 8.5*  HCT 26.6* 29.0* 25.7* 25.5*  MCV 68.4*  --  68.7* 68.9*  PLT 271  --  252 238   . amLODipine  10 mg Oral Daily  . ferrous sulfate  325 mg Oral BID  .  furosemide  160 mg Oral q morning - 10a  . furosemide  80 mg Oral QPM  . heparin  5,000 Units Subcutaneous Q8H  . hydrALAZINE  100 mg Oral Q8H  . insulin aspart  0-9 Units Subcutaneous TID WC  . lisinopril  10 mg Oral QHS  . metoprolol tartrate  75 mg Oral BID  . sodium bicarbonate  650 mg Oral BID  . sodium chloride  3 mL Intravenous Q12H  . sodium chloride  3 mL Intravenous Q12H     acetaminophen, acetaminophen, labetalol, ondansetron (ZOFRAN) IV, ondansetron, traMADol

## 2013-04-15 ENCOUNTER — Telehealth: Payer: Self-pay | Admitting: *Deleted

## 2013-04-15 DIAGNOSIS — N179 Acute kidney failure, unspecified: Secondary | ICD-10-CM | POA: Diagnosis present

## 2013-04-15 DIAGNOSIS — N189 Chronic kidney disease, unspecified: Secondary | ICD-10-CM | POA: Diagnosis present

## 2013-04-15 LAB — GLUCOSE, CAPILLARY
Glucose-Capillary: 113 mg/dL — ABNORMAL HIGH (ref 70–99)
Glucose-Capillary: 118 mg/dL — ABNORMAL HIGH (ref 70–99)
Glucose-Capillary: 114 mg/dL — ABNORMAL HIGH (ref 70–99)

## 2013-04-15 LAB — BASIC METABOLIC PANEL
BUN: 74 mg/dL — ABNORMAL HIGH (ref 6–23)
CO2: 22 mEq/L (ref 19–32)
Chloride: 96 mEq/L (ref 96–112)
Creatinine, Ser: 5.95 mg/dL — ABNORMAL HIGH (ref 0.50–1.35)
GFR calc Af Amer: 13 mL/min — ABNORMAL LOW (ref >90)
Potassium: 3.6 mEq/L (ref 3.5–5.1)

## 2013-04-15 MED ORDER — LISINOPRIL 40 MG PO TABS
40.0000 mg | ORAL_TABLET | Freq: Every day | ORAL | Status: DC
  Administered 2013-04-15 – 2013-04-18 (×4): 40 mg via ORAL
  Filled 2013-04-15 (×5): qty 1

## 2013-04-15 MED ORDER — METOPROLOL TARTRATE 100 MG PO TABS
100.0000 mg | ORAL_TABLET | Freq: Two times a day (BID) | ORAL | Status: DC
  Administered 2013-04-15 – 2013-04-16 (×4): 100 mg via ORAL
  Filled 2013-04-15 (×6): qty 1

## 2013-04-15 NOTE — Progress Notes (Signed)
   Pt is scheduled for Monday.  He does not need to be transferred to [facility].  The day of the procedure, transportation to [facility] will be arranged and post-operatively he will return to [facility].  Adele Barthel, MD [facility] [place] Office: [phone] Pager: [phone]  04/15/2013, 7:36 AM

## 2013-04-15 NOTE — Care Management Note (Addendum)
    Page 1 of 2   04/19/2013     11:04:42 AM   CARE MANAGEMENT NOTE 04/19/2013  Patient:  Joshua [name], Joshua [name]   Account Number:  [id number]  Date Initiated:  04/12/2013  Documentation initiated by:  DAVIS,RHONDA  Subjective/Objective Assessment:   hypertesnive crisis in patient with multiple heath problems     Action/Plan:   home when stable   Anticipated DC Date:  04/19/2013   Anticipated DC Plan:  HOME/SELF CARE  In-house referral  [place]  CM consult      [facility] Choice  NA   Choice offered to / List presented to:  NA   DME arranged  NA      DME agency  NA     [place] arranged  NA      [place] agency  NA   Status of service:  Completed, signed off Medicare Important Message given?  NA - LOS <3 / Initial given by admissions (If response is "NO", the following Medicare IM given date fields will be blank) Date Medicare IM given:   Date Additional Medicare IM given:    Discharge Disposition:  HOME/SELF CARE  Per UR Regulation:  Reviewed for med. necessity/level of care/duration of stay  If discussed at [place] of Stay Meetings, dates discussed:   04/19/2013    Comments:  04/19/13 Agastya Meister RN,BSN NCM [address] PCP APPT W/COMM[facility]ITY CL[facility]IC.HE GETS HIS MEDS FROM [facility],& HAS NO PROBLEMS W/PAYMENT.NO D/C NEEDS OR ORDERS.  04/18/13 Neftaly Swiss RN,BSN NCM [id number] S/P AV FISTULA.D/C HOME IF STABLE IN AM.PROVIDED W/PCP LISTING, $4 [facility] LIST.  04/15/13 Domanic Matusek RN,BSN NCM [phone] TRANSFER FROM SDU.PATIENT STATES HE CAN MAKE HIS OWN PCP APPT W/COMMUNITY CLINIC.HE GETS HIS MEDS FROM HIS NEPHROLOGIST OFFICE.NO FURTHER NEEDS OR ORDERS.  [id number] Davis,RN,BSN,CCM: email sent to [facility] for appt to be schedule for mid Dec,2014 once plan of care is established.  Patient will need match program at time of discharge.  [id number] Rosana Hoes, RN, BSN,  [place] [phone] Chart Reviewed for discharge and hospital needs. Discharge needs at time of review:  None present will follow for needs. Review of patient progress due on [id number].

## 2013-04-15 NOTE — Progress Notes (Signed)
  [facility] Progress Note    Subjective: BP's a little better, on for OR on Monday for hd access   Exam  Blood pressure 171/98, pulse 76, temperature 98.1 F (36.7 C), temperature source Oral, resp. rate 20, height 5\' 11"  (1.803 m), weight 115.9 kg (255 lb 8.2 oz), SpO2 100.00%. gen: alert, no distress, sitting up  neck: no jvd, no LAN  chest: clear bilat  cor: regular, no rub , M or gallop, pedal pulses intact  abd: soft, obese, no hsm, no ascites  ext: no LE edema or UE edema neuro: alert, ox3, nonfocal   Assessment/Plan:  1. CKD stage 4/5- for access surgery on monday 2. HTN'sive crisis, recurrent- continues to diurese on po lasix at high dose, should improve BP control; on max bp meds x 4 at this time, no change today 3. Heart failure with reduced EF 30% 4. Anemia of CKD- HG 8-9, low fe sats earlier this year, repeat pending 5. Metabolic acidosis- NaHCO3 bid 6. MBD- phos 5.1, Ca 9, pth 170 in July this year 7. Dispo- after access procedure   Kelly Splinter MD  pager [phone]    cell [phone]  04/15/2013, 4:19 PM   Recent Labs Lab 04/12/13 0333 04/13/13 1016 04/14/13 0326 04/15/13 0355  NA 137  --  134* 133*  K 3.8  --  3.5 3.6  CL 100  --  96 96  CO2 19  --  21 22  GLUCOSE 147*  --  117* 104*  BUN 73*  --  72* 74*  CREATININE 5.80*  --  5.93* 5.95*  CALCIUM 9.0  --  7.9* 8.7  PHOS  --  5.1*  --   --     Recent Labs Lab 04/11/13 2200 04/12/13 0333  AST 34 27  ALT 54* 51  ALKPHOS 229* 216*  BILITOT 2.0* 1.7*  PROT 8.1 7.5  ALBUMIN 3.6 3.4*    Recent Labs Lab 04/11/13 2200 04/11/13 2210 04/12/13 0333 04/14/13 0326  WBC 15.0*  --  13.3* 13.4*  NEUTROABS 12.9*  --   --   --   HGB 8.8* 9.9* 8.5* 8.5*  HCT 26.6* 29.0* 25.7* 25.5*  MCV 68.4*  --  68.7* 68.9*  PLT 271  --  252 238   . amLODipine  10 mg Oral Daily  . [START ON 04/18/2013] cefUROXime (ZINACEF)  IV  1.5 g Intravenous 60 min Pre-Op  . ferrous sulfate  325 mg Oral BID   . furosemide  160 mg Oral BID  . heparin  5,000 Units Subcutaneous Q8H  . hydrALAZINE  100 mg Oral Q8H  . insulin aspart  0-9 Units Subcutaneous TID WC  . lisinopril  40 mg Oral QHS  . metoprolol tartrate  100 mg Oral BID  . sodium bicarbonate  650 mg Oral BID  . sodium chloride  3 mL Intravenous Q12H  . sodium chloride  3 mL Intravenous Q12H     acetaminophen, acetaminophen, labetalol, ondansetron (ZOFRAN) IV, ondansetron, traMADol

## 2013-04-15 NOTE — Progress Notes (Signed)
[facility] PROGRESS NOTE  Joshua [name] [id number] DOB: Aug 13, 1980 DOA: 04/11/2013  PCP: No PCP per patient  Brief HPI: Joshua [name] is a 32 y.o. male with history of chronic kidney disease stage IV, hypertension, cardiomyopathy last EF was 30%, diabetes on diet, chronic anemia secondary to chronic kidney disease presented to the ER because of blurred vision headache and nausea vomiting. In the ER CT head was negative for acute findings. Chest x-ray showed improving congestion from previous. Patient's blood pressure was found to be very high and was started on Cardene infusion and has been admitted for hypertensive emergency.  Past medical history:  Past Medical History  Diagnosis Date  . Arthritis   . CKD (chronic kidney disease) stage 4, GFR 15-29 ml/min   . Hypertension     Hypertensive urgency/emergency - no RAS, normal aldo/PRA ratio,high urine metanephrines and normetanephrines   . Atrial fibrillation   . Type II diabetes mellitus   . History of bronchitis   . Sleep apnea     No CPAP  . Headache(784.0)     Regular  . Anemia of chronic disease     Consultants: Nephrology, Vascular  Procedures: None  Antibiotics: None  Subjective: Patient continues to feel fine. No complaints offered.   Objective: Vital Signs  Filed Vitals:   04/14/13 1636 04/14/13 2006 04/15/13 0157 04/15/13 0645  BP: 176/116 169/120 165/109 190/111  Pulse: 85 84  74  Temp: 98.2 F (36.8 C) 97.4 F (36.3 C)  97.5 F (36.4 C)  TempSrc: Oral Oral  Oral  Resp: 16 18  20   Height: 5\' 11"  (1.803 m)     Weight: 116.121 kg (256 lb)   115.9 kg (255 lb 8.2 oz)  SpO2: 100% 100%  100%    Filed Weights   04/14/13 0800 04/14/13 1636 04/15/13 0645  Weight: 155.5 kg (342 lb 13 oz) 116.121 kg (256 lb) 115.9 kg (255 lb 8.2 oz)   Intake/Output from previous day: 11/20 0701 - 11/21 0700 In: 1404 [P.O.:1404] Out: 2950 [Urine:2950]  General appearance: alert, cooperative, appears stated age and  no distress Resp: clear to auscultation bilaterally Cardio: regular rate and rhythm, S1, S2 normal, no murmur, click, rub or gallop GI: soft, non-tender; bowel sounds normal; no masses,  no organomegaly Extremities: extremities normal, atraumatic, no cyanosis or edema Neurologic: Alert and oriented x 3. No focal deficits.  Lab Results:  Basic Metabolic Panel:  Recent Labs Lab 04/11/13 2200 04/11/13 2210 04/12/13 0333 04/13/13 1016 04/14/13 0326 04/15/13 0355  NA 133* 138 137  --  134* 133*  K 4.0 3.9 3.8  --  3.5 3.6  CL 97 104 100  --  96 96  CO2 17*  --  19  --  21 22  GLUCOSE 167* 162* 147*  --  117* 104*  BUN 74* 75* 73*  --  72* 74*  CREATININE 5.63* 6.20* 5.80*  --  5.93* 5.95*  CALCIUM 9.4  --  9.0  --  7.9* 8.7  PHOS  --   --   --  5.1*  --   --    Liver Function Tests:  Recent Labs Lab 04/11/13 2200 04/12/13 0333  AST 34 27  ALT 54* 51  ALKPHOS 229* 216*  BILITOT 2.0* 1.7*  PROT 8.1 7.5  ALBUMIN 3.6 3.4*    Recent Labs Lab 04/11/13 2200  LIPASE 21   CBC:  Recent Labs Lab 04/11/13 2200 04/11/13 2210 04/12/13 0333 04/14/13 0326  WBC 15.0*  --  13.3* 13.4*  NEUTROABS 12.9*  --   --   --   HGB 8.8* 9.9* 8.5* 8.5*  HCT 26.6* 29.0* 25.7* 25.5*  MCV 68.4*  --  68.7* 68.9*  PLT 271  --  252 238   Cardiac Enzymes:  Recent Labs Lab 04/11/13 2200  TROPONINI <0.30   BNP (last 3 results)  Recent Labs  01/06/13 1847 01/24/13 0720  PROBNP 61864.0* >70000.0*   CBG:  Recent Labs Lab 04/14/13 0748 04/14/13 1201 04/14/13 1659 04/14/13 2114 04/15/13 0726  GLUCAP 114* 123* 113* 112* 118*    Recent Results (from the past 240 hour(s))  MRSA PCR SCREENING     Status: None   Collection Time    04/12/13  2:26 AM      Result Value Range Status   MRSA by PCR NEGATIVE  NEGATIVE Final   Comment:            The GeneXpert MRSA Assay (FDA     approved for NASAL specimens     only), is one component of a     comprehensive MRSA colonization      surveillance program. It is not     intended to diagnose MRSA     infection nor to guide or     monitor treatment for     MRSA infections.      Studies/Results: No results found.  Medications:  Scheduled: . amLODipine  10 mg Oral Daily  . [START ON 04/18/2013] cefUROXime (ZINACEF)  IV  1.5 g Intravenous 60 min Pre-Op  . ferrous sulfate  325 mg Oral BID  . furosemide  160 mg Oral BID  . heparin  5,000 Units Subcutaneous Q8H  . hydrALAZINE  100 mg Oral Q8H  . insulin aspart  0-9 Units Subcutaneous TID WC  . lisinopril  40 mg Oral QHS  . metoprolol tartrate  100 mg Oral BID  . sodium bicarbonate  650 mg Oral BID  . sodium chloride  3 mL Intravenous Q12H  . sodium chloride  3 mL Intravenous Q12H   Continuous:   [id number], acetaminophen, labetalol, ondansetron (ZOFRAN) IV, ondansetron, traMADol  Assessment/Plan:  Principal Problem:   Hypertensive emergency Active Problems:   Diabetes   Chronic kidney disease (CKD), stage IV (severe)   Anemia in chronic kidney disease    Hypertensive emergency/Accelerated HTN HTN emergency has resolved. BP however remains high. Probably secondary to non compliance to medications. He is now off of Cardene since 11/19. Will increase Metoprolol and Lisinopril. Continue Hydralazine and Amlodipine. BP will likely take many days to stabilize. He doesn't have symptoms suggestive of acute end organ involvement.  Nausea and vomiting with leukocytosis Patient is afebrile. UA shows no infection. US abdomen shows cholelithiasis but no evidence for cholecystitis. Now better. Nausea was likely secondary to elevated BP.   Acute on Chronic kidney disease stage IV with mild metabolic acidosis Creatinine much higher than his baseline which is around 4-5. Appreciate Nephrology input. Plan is to initiate dialysis in near future. Continue oral Lasix. On Bicarb tablets. For vascular access placement on 11/24.  History of cardiomyopathy with last EF  was 30% Appears to be well compensated. Not much pedal edema. Closely follow intake output and metabolic panel. Patient is on Lasix and ACEI.   Chronic anemia probably from kidney disease Stable. Closely follow Hgb  Diabetes mellitus type 2 on diet control Stable  Incidental Cholelithiasis Can be monitored as OP.  DVT Prophylaxis: Heparin Code Status: Full code.  Family  Communication: Discussed with patient Disposition Plan: Await Better BP control. Vascular procedure on 11/24. Cannot be discharged prior to that per Nephrologist.     LOS: 4 days   [place] [facility] Pager [phone] 04/15/2013, 8:15 AM  If 8PM-8AM, please contact night-coverage at [email or web address], password [facility]

## 2013-04-15 NOTE — Telephone Encounter (Signed)
Left message to call back to schedule appt for HFU.

## 2013-04-16 LAB — GLUCOSE, CAPILLARY
Glucose-Capillary: 110 mg/dL — ABNORMAL HIGH (ref 70–99)
Glucose-Capillary: 120 mg/dL — ABNORMAL HIGH (ref 70–99)
Glucose-Capillary: 152 mg/dL — ABNORMAL HIGH (ref 70–99)

## 2013-04-16 MED ORDER — CLONIDINE HCL 0.1 MG PO TABS
0.1000 mg | ORAL_TABLET | Freq: Once | ORAL | Status: AC
  Administered 2013-04-16: 0.1 mg via ORAL
  Filled 2013-04-16: qty 1

## 2013-04-16 MED ORDER — CLONIDINE HCL 0.2 MG PO TABS
0.2000 mg | ORAL_TABLET | Freq: Three times a day (TID) | ORAL | Status: DC
  Administered 2013-04-16 – 2013-04-19 (×8): 0.2 mg via ORAL
  Filled 2013-04-16 (×11): qty 1

## 2013-04-16 MED ORDER — SODIUM CHLORIDE 0.9 % IV SOLN
250.0000 mg | INTRAVENOUS | Status: AC
  Administered 2013-04-16 – 2013-04-18 (×2): 250 mg via INTRAVENOUS
  Filled 2013-04-16 (×2): qty 20

## 2013-04-16 NOTE — Progress Notes (Signed)
[facility] PROGRESS NOTE  Joshua [name] [id number] DOB: [birth year] DOA: 04/11/2013  PCP: No PCP per patient  Brief HPI: Joshua [name] is a 32 y.o. male with history of chronic kidney disease stage IV, hypertension, cardiomyopathy last EF was 30%, diabetes on diet, chronic anemia secondary to chronic kidney disease presented to the ER because of blurred vision headache and nausea vomiting. In the ER CT head was negative for acute findings. Chest x-ray showed improving congestion from previous. Patient's blood pressure was found to be very high and was started on Cardene infusion and has been admitted for hypertensive emergency.  Past medical history:  Past Medical History  Diagnosis Date  . Arthritis   . CKD (chronic kidney disease) stage 4, GFR 15-29 ml/min   . Hypertension     Hypertensive urgency/emergency - no RAS, normal aldo/PRA ratio,high urine metanephrines and normetanephrines   . Atrial fibrillation   . Type II diabetes mellitus   . History of bronchitis   . Sleep apnea     No CPAP  . Headache(784.0)     Regular  . Anemia of chronic disease     Consultants: Nephrology, Vascular  Procedures: None  Antibiotics: None  Subjective: Patient feels well. No complaints offered.   Objective: Vital Signs  Filed Vitals:   04/16/13 0206 04/16/13 0636 04/16/13 0655 04/16/13 0742  BP: 182/117 197/131  176/112  Pulse: 72 78  74  Temp:  98.7 F (37.1 C)    TempSrc:  Oral    Resp:  20    Height:      Weight:   113.9 kg (251 lb 1.7 oz)   SpO2:  100%      Filed Weights   04/14/13 1636 04/15/13 0645 04/16/13 0655  Weight: 116.121 kg (256 lb) 115.9 kg (255 lb 8.2 oz) 113.9 kg (251 lb 1.7 oz)   Intake/Output from previous day: 11/21 0701 - 11/22 0700 In: 720 [P.O.:720] Out: 4495 [Urine:4495]  General appearance: alert, cooperative, appears stated age and no distress Resp: clear to auscultation bilaterally Cardio: regular rate and rhythm, S1, S2 normal, no  murmur, click, rub or gallop GI: soft, non-tender; bowel sounds normal; no masses,  no organomegaly Extremities: extremities normal, atraumatic, no cyanosis or edema Neurologic: Alert and oriented x 3. No focal deficits.  Lab Results:  Basic Metabolic Panel:  Recent Labs Lab 04/11/13 2200 04/11/13 2210 04/12/13 0333 04/13/13 1016 04/14/13 0326 04/15/13 0355  NA 133* 138 137  --  134* 133*  K 4.0 3.9 3.8  --  3.5 3.6  CL 97 104 100  --  96 96  CO2 17*  --  19  --  21 22  GLUCOSE 167* 162* 147*  --  117* 104*  BUN 74* 75* 73*  --  72* 74*  CREATININE 5.63* 6.20* 5.80*  --  5.93* 5.95*  CALCIUM 9.4  --  9.0  --  7.9* 8.7  PHOS  --   --   --  5.1*  --   --    Liver Function Tests:  Recent Labs Lab 04/11/13 2200 04/12/13 0333  AST 34 27  ALT 54* 51  ALKPHOS 229* 216*  BILITOT 2.0* 1.7*  PROT 8.1 7.5  ALBUMIN 3.6 3.4*    Recent Labs Lab 04/11/13 2200  LIPASE 21   CBC:  Recent Labs Lab 04/11/13 2200 04/11/13 2210 04/12/13 0333 04/14/13 0326  WBC 15.0*  --  13.3* 13.4*  NEUTROABS 12.9*  --   --   --  HGB 8.8* 9.9* 8.5* 8.5*  HCT 26.6* 29.0* 25.7* 25.5*  MCV 68.4*  --  68.7* 68.9*  PLT 271  --  252 238   Cardiac Enzymes:  Recent Labs Lab 04/11/13 2200  TROPONINI <0.30   BNP (last 3 results)  Recent Labs  01/06/13 1847 01/24/13 0720  PROBNP 61864.0* >70000.0*   CBG:  Recent Labs Lab 04/14/13 2114 04/15/13 0726 04/15/13 1224 04/15/13 1641 04/15/13 2243  GLUCAP 112* 118* 113* 118* 114*    Recent Results (from the past 240 hour(s))  MRSA PCR SCREENING     Status: None   Collection Time    04/12/13  2:26 AM      Result Value Range Status   MRSA by PCR NEGATIVE  NEGATIVE Final   Comment:            The GeneXpert MRSA Assay (FDA     approved for NASAL specimens     only), is one component of a     comprehensive MRSA colonization     surveillance program. It is not     intended to diagnose MRSA     infection nor to guide or      monitor treatment for     MRSA infections.      Studies/Results: No results found.  Medications:  Scheduled: . amLODipine  10 mg Oral Daily  . [START ON 04/18/2013] cefUROXime (ZINACEF)  IV  1.5 g Intravenous 60 min Pre-Op  . ferrous sulfate  325 mg Oral BID  . furosemide  160 mg Oral BID  . heparin  5,000 Units Subcutaneous Q8H  . hydrALAZINE  100 mg Oral Q8H  . insulin aspart  0-9 Units Subcutaneous TID WC  . lisinopril  40 mg Oral QHS  . metoprolol tartrate  100 mg Oral BID  . sodium bicarbonate  650 mg Oral BID  . sodium chloride  3 mL Intravenous Q12H  . sodium chloride  3 mL Intravenous Q12H   Continuous:   [id number], acetaminophen, labetalol, ondansetron (ZOFRAN) IV, ondansetron, traMADol  Assessment/Plan:  Principal Problem:   Hypertensive emergency Active Problems:   Diabetes   Chronic kidney disease (CKD), stage IV (severe)   Anemia in chronic kidney disease   Acute on chronic renal failure    Hypertensive emergency/Accelerated HTN HTN emergency has resolved. BP however remains high. Probably secondary to non compliance to medications. He is now off of Cardene since 11/19. Doses of Metoprolol and Lisinopril were increased 11/21. Continue Hydralazine and Amlodipine. BP will likely take many days to stabilize. He doesn't have symptoms suggestive of acute end organ involvement. Will not make any further medication adjustments today.  Nausea and vomiting with leukocytosis Patient is afebrile. UA shows no infection. US abdomen shows cholelithiasis but no evidence for cholecystitis. No further nausea. Nausea was likely secondary to elevated BP.   Acute on Chronic kidney disease stage IV with mild metabolic acidosis Creatinine much higher than his baseline which is around 4-5. Appreciate Nephrology input. Plan is to initiate dialysis in near future. Continue oral Lasix. On Bicarb tablets. For vascular access placement on 11/24.  History of cardiomyopathy  with last EF was 30% Appears to be well compensated. Not much pedal edema. Closely follow intake output and metabolic panel. Patient is on Lasix and ACEI.   Chronic anemia probably from kidney disease Stable. Closely follow Hgb  Diabetes mellitus type 2 on diet control Stable  Incidental Cholelithiasis Can be monitored as OP.  DVT Prophylaxis: Heparin Code Status:  Full code.  Family Communication: Discussed with patient Disposition Plan: Vascular procedure on 11/24. Cannot be discharged prior to that per Nephrologist due to compliance issues.    LOS: 5 days   [facility] Pager [phone] 04/16/2013, 8:02 AM  If 8PM-8AM, please contact night-coverage at [email or web address], password [facility]

## 2013-04-16 NOTE — Progress Notes (Signed)
Notified NP on call of high BP this am.  Order received to give clonidine po.  Will continue to monitor patient's blood pressure.

## 2013-04-16 NOTE — Progress Notes (Signed)
  [facility] Progress Note    Subjective: BP still high, no complaints,  OR on Monday for hd access , 4.4L UOP yesterday    Exam  Blood pressure 157/89, pulse 76, temperature 98.7 F (37.1 C), temperature source Oral, resp. rate 20, height 5\' 11"  (1.803 m), weight 113.9 kg (251 lb 1.7 oz), SpO2 99.00%. standing BP R arm, regular cuff = 156/108 gen: alert, no distress, sitting up  neck: no jvd, no LAN  chest: clear bilat  cor: regular, no rub , M or gallop, pedal pulses intact  abd: soft, obese, no hsm, no ascites  ext: no LE edema or UE edema neuro: alert, ox3, nonfocal   Assessment/Plan:  1. CKD stage 4/5- for access surgery on monday 2. HTN, refractory: still diuresing with high dose po lasix, bp still high on max doses of 4 bp meds. Will add clonidine which he has taken before  3. Heart failure with reduced EF 30% 4. Anemia of CKD- HG 8-9, low fe sat 16%, plan IV iron bolus with ferric gluconate 250 mg iv x 2 5. Metabolic acidosis- NaHCO3 bid 6. MBD- phos 5.1, Ca 9, pth 170 in July this year 7. Dispo- home after access procedure   Kelly Splinter MD  pager [phone]    cell [phone]  04/16/2013, 3:08 PM   Recent Labs Lab 04/12/13 0333 04/13/13 1016 04/14/13 0326 04/15/13 0355  NA 137  --  134* 133*  K 3.8  --  3.5 3.6  CL 100  --  96 96  CO2 19  --  21 22  GLUCOSE 147*  --  117* 104*  BUN 73*  --  72* 74*  CREATININE 5.80*  --  5.93* 5.95*  CALCIUM 9.0  --  7.9* 8.7  PHOS  --  5.1*  --   --     Recent Labs Lab 04/11/13 2200 04/12/13 0333  AST 34 27  ALT 54* 51  ALKPHOS 229* 216*  BILITOT 2.0* 1.7*  PROT 8.1 7.5  ALBUMIN 3.6 3.4*    Recent Labs Lab 04/11/13 2200 04/11/13 2210 04/12/13 0333 04/14/13 0326  WBC 15.0*  --  13.3* 13.4*  NEUTROABS 12.9*  --   --   --   HGB 8.8* 9.9* 8.5* 8.5*  HCT 26.6* 29.0* 25.7* 25.5*  MCV 68.4*  --  68.7* 68.9*  PLT 271  --  252 238   . amLODipine  10 mg Oral Daily  . [START ON 04/18/2013]  cefUROXime (ZINACEF)  IV  1.5 g Intravenous 60 min Pre-Op  . ferrous sulfate  325 mg Oral BID  . furosemide  160 mg Oral BID  . heparin  5,000 Units Subcutaneous Q8H  . hydrALAZINE  100 mg Oral Q8H  . insulin aspart  0-9 Units Subcutaneous TID WC  . lisinopril  40 mg Oral QHS  . metoprolol tartrate  100 mg Oral BID  . sodium bicarbonate  650 mg Oral BID  . sodium chloride  3 mL Intravenous Q12H  . sodium chloride  3 mL Intravenous Q12H     acetaminophen, acetaminophen, labetalol, ondansetron (ZOFRAN) IV, ondansetron, traMADol

## 2013-04-17 LAB — GLUCOSE, CAPILLARY
Glucose-Capillary: 116 mg/dL — ABNORMAL HIGH (ref 70–99)
Glucose-Capillary: 127 mg/dL — ABNORMAL HIGH (ref 70–99)
Glucose-Capillary: 128 mg/dL — ABNORMAL HIGH (ref 70–99)

## 2013-04-17 MED ORDER — METOPROLOL TARTRATE 100 MG PO TABS
100.0000 mg | ORAL_TABLET | Freq: Two times a day (BID) | ORAL | Status: DC
  Administered 2013-04-17 – 2013-04-19 (×4): 100 mg via ORAL
  Filled 2013-04-17 (×6): qty 1

## 2013-04-17 MED ORDER — MINOXIDIL 2.5 MG PO TABS
5.0000 mg | ORAL_TABLET | Freq: Two times a day (BID) | ORAL | Status: DC
  Administered 2013-04-17 – 2013-04-19 (×4): 5 mg via ORAL
  Filled 2013-04-17 (×7): qty 2

## 2013-04-17 NOTE — Progress Notes (Signed)
  [name] Progress Note    Subjective: No complaints, BP's 140-160/100-110   Exam  Blood pressure 160/106, pulse 64, temperature 97.5 F (36.4 C), temperature source Oral, resp. rate 16, height 5\' 11"  (1.803 m), weight 113.2 kg (249 lb 9 oz), SpO2 100.00%. standing BP R arm, regular cuff = 156/108 gen: alert, no distress, sitting up  neck: no jvd, no LAN  chest: clear bilat  cor: regular, no rub , M or gallop, pedal pulses intact  abd: soft, obese, no hsm, no ascites  ext: no LE edema or UE edema neuro: alert, ox3, nonfocal   Assessment/Plan:  1. CKD stage 4/5- for access surgery on monday 2. HTN, refractory: on 5 BP medications and high dose lasix, not grossly vol overloaded, will add minoxidil 3. Heart failure with reduced EF 30% 4. Anemia of CKD- HG 8-9, low fe sat 16%, plan IV iron bolus with ferric gluconate 250 mg iv x 2 5. Metabolic acidosis- NaHCO3 bid 6. MBD- phos 5.1, Ca 9, pth 170 in July this year 7. Dispo- per primary   Kelly Splinter MD  pager [phone]    cell [phone]  04/17/2013, 8:34 AM   Recent Labs Lab 04/12/13 0333 04/13/13 1016 04/14/13 0326 04/15/13 0355  NA 137  --  134* 133*  K 3.8  --  3.5 3.6  CL 100  --  96 96  CO2 19  --  21 22  GLUCOSE 147*  --  117* 104*  BUN 73*  --  72* 74*  CREATININE 5.80*  --  5.93* 5.95*  CALCIUM 9.0  --  7.9* 8.7  PHOS  --  5.1*  --   --     Recent Labs Lab 04/11/13 2200 04/12/13 0333  AST 34 27  ALT 54* 51  ALKPHOS 229* 216*  BILITOT 2.0* 1.7*  PROT 8.1 7.5  ALBUMIN 3.6 3.4*    Recent Labs Lab 04/11/13 2200 04/11/13 2210 04/12/13 0333 04/14/13 0326  WBC 15.0*  --  13.3* 13.4*  NEUTROABS 12.9*  --   --   --   HGB 8.8* 9.9* 8.5* 8.5*  HCT 26.6* 29.0* 25.7* 25.5*  MCV 68.4*  --  68.7* 68.9*  PLT 271  --  252 238   . amLODipine  10 mg Oral Daily  . [START ON 04/18/2013] cefUROXime (ZINACEF)  IV  1.5 g Intravenous 60 min Pre-Op  . cloNIDine  0.2 mg Oral TID  . ferric  gluconate (FERRLECIT/NULECIT) IV  250 mg Intravenous Q48H  . ferrous sulfate  325 mg Oral BID  . furosemide  160 mg Oral BID  . heparin  5,000 Units Subcutaneous Q8H  . hydrALAZINE  100 mg Oral Q8H  . insulin aspart  0-9 Units Subcutaneous TID WC  . lisinopril  40 mg Oral QHS  . metoprolol tartrate  100 mg Oral BID  . minoxidil  5 mg Oral BID  . sodium bicarbonate  650 mg Oral BID  . sodium chloride  3 mL Intravenous Q12H  . sodium chloride  3 mL Intravenous Q12H     acetaminophen, acetaminophen, labetalol, ondansetron (ZOFRAN) IV, ondansetron, traMADol

## 2013-04-17 NOTE — Progress Notes (Signed)
[facility] PROGRESS NOTE  Joshua [name] [id number] DOB: 09-24-1980 DOA: 04/11/2013  PCP: No PCP per patient  Brief HPI: Joshua [name] is a 32 y.o. male with history of chronic kidney disease stage IV, hypertension, cardiomyopathy last EF was 30%, diabetes on diet, chronic anemia secondary to chronic kidney disease presented to the ER because of blurred vision headache and nausea vomiting. In the ER CT head was negative for acute findings. Chest x-ray showed improving congestion from previous. Patient's blood pressure was found to be very high and was started on Cardene infusion and has been admitted for hypertensive emergency.  Past medical history:  Past Medical History  Diagnosis Date  . Arthritis   . CKD (chronic kidney disease) stage 4, GFR 15-29 ml/min   . Hypertension     Hypertensive urgency/emergency - no RAS, normal aldo/PRA ratio,high urine metanephrines and normetanephrines   . Atrial fibrillation   . Type II diabetes mellitus   . History of bronchitis   . Sleep apnea     No CPAP  . Headache(784.0)     Regular  . Anemia of chronic disease     Consultants: Nephrology, Vascular  Procedures: None  Antibiotics: None  Subjective: Patient feels fine. No complaints offered.   Objective: Vital Signs  Filed Vitals:   04/16/13 2137 04/17/13 0300 04/17/13 0440 04/17/13 0649  BP: 146/101 158/104 150/104 160/106  Pulse: 72 70 68 64  Temp: 97.5 F (36.4 C)   97.5 F (36.4 C)  TempSrc: Oral   Oral  Resp: 16 16 16 16   Height:      Weight:    113.2 kg (249 lb 9 oz)  SpO2: 100%   100%    Filed Weights   04/15/13 0645 04/16/13 0655 04/17/13 0649  Weight: 115.9 kg (255 lb 8.2 oz) 113.9 kg (251 lb 1.7 oz) 113.2 kg (249 lb 9 oz)   Intake/Output from previous day: 11/22 0701 - 11/23 0700 In: 1680 [P.O.:1560; IV Piggyback:120] Out: 2725 [Urine:2725]  General appearance: alert, cooperative, appears stated age and no distress Resp: clear to auscultation  bilaterally Cardio: regular rate and rhythm, S1, S2 normal, no murmur, click, rub or gallop GI: soft, non-tender; bowel sounds normal; no masses,  no organomegaly Extremities: extremities normal, atraumatic, no cyanosis or edema Neurologic: Alert and oriented x 3. No focal deficits.  Lab Results:  Basic Metabolic Panel:  Recent Labs Lab 04/11/13 2200 04/11/13 2210 04/12/13 0333 04/13/13 1016 04/14/13 0326 04/15/13 0355  NA 133* 138 137  --  134* 133*  K 4.0 3.9 3.8  --  3.5 3.6  CL 97 104 100  --  96 96  CO2 17*  --  19  --  21 22  GLUCOSE 167* 162* 147*  --  117* 104*  BUN 74* 75* 73*  --  72* 74*  CREATININE 5.63* 6.20* 5.80*  --  5.93* 5.95*  CALCIUM 9.4  --  9.0  --  7.9* 8.7  PHOS  --   --   --  5.1*  --   --    Liver Function Tests:  Recent Labs Lab 04/11/13 2200 04/12/13 0333  AST 34 27  ALT 54* 51  ALKPHOS 229* 216*  BILITOT 2.0* 1.7*  PROT 8.1 7.5  ALBUMIN 3.6 3.4*    Recent Labs Lab 04/11/13 2200  LIPASE 21   CBC:  Recent Labs Lab 04/11/13 2200 04/11/13 2210 04/12/13 0333 04/14/13 0326  WBC 15.0*  --  13.3* 13.4*  NEUTROABS 12.9*  --   --   --  HGB 8.8* 9.9* 8.5* 8.5*  HCT 26.6* 29.0* 25.7* 25.5*  MCV 68.4*  --  68.7* 68.9*  PLT 271  --  252 238   Cardiac Enzymes:  Recent Labs Lab 04/11/13 2200  TROPONINI <0.30   BNP (last 3 results)  Recent Labs  01/06/13 1847 01/24/13 0720  PROBNP 61864.0* >70000.0*   CBG:  Recent Labs Lab 04/16/13 0748 04/16/13 1209 04/16/13 1710 04/16/13 2136 04/17/13 0732  GLUCAP 110* 120* 105* 152* 116*    Recent Results (from the past 240 hour(s))  MRSA PCR SCREENING     Status: None   Collection Time    04/12/13  2:26 AM      Result Value Range Status   MRSA by PCR NEGATIVE  NEGATIVE Final   Comment:            The GeneXpert MRSA Assay (FDA     approved for NASAL specimens     only), is one component of a     comprehensive MRSA colonization     surveillance program. It is not      intended to diagnose MRSA     infection nor to guide or     monitor treatment for     MRSA infections.      Studies/Results: No results found.  Medications:  Scheduled: . amLODipine  10 mg Oral Daily  . [START ON 04/18/2013] cefUROXime (ZINACEF)  IV  1.5 g Intravenous 60 min Pre-Op  . cloNIDine  0.2 mg Oral TID  . ferric gluconate (FERRLECIT/NULECIT) IV  250 mg Intravenous Q48H  . ferrous sulfate  325 mg Oral BID  . furosemide  160 mg Oral BID  . heparin  5,000 Units Subcutaneous Q8H  . hydrALAZINE  100 mg Oral Q8H  . insulin aspart  0-9 Units Subcutaneous TID WC  . lisinopril  40 mg Oral QHS  . metoprolol tartrate  100 mg Oral BID  . minoxidil  5 mg Oral BID  . sodium bicarbonate  650 mg Oral BID  . sodium chloride  3 mL Intravenous Q12H  . sodium chloride  3 mL Intravenous Q12H   Continuous:   [id number], acetaminophen, labetalol, ondansetron (ZOFRAN) IV, ondansetron, traMADol  Assessment/Plan:  Principal Problem:   Hypertensive emergency Active Problems:   Diabetes   Chronic kidney disease (CKD), stage IV (severe)   Anemia in chronic kidney disease   Acute on chronic renal failure    Hypertensive emergency/Accelerated HTN HTN emergency has resolved. BP however remains high. Probably secondary to non compliance to medications. He is now off of Cardene since 11/19. Doses of Metoprolol and Lisinopril were increased 11/21. Continue Hydralazine and Amlodipine. Clonidine was added 11/22. Minoxidil added 11/23. BP will likely take many days to stabilize. He doesn't have symptoms suggestive of acute end organ involvement.  Nausea and vomiting with leukocytosis Patient is afebrile. UA shows no infection. US abdomen shows cholelithiasis but no evidence for cholecystitis. No further nausea. Nausea was likely secondary to elevated BP.   Acute on Chronic kidney disease stage IV with mild metabolic acidosis Creatinine much higher than his baseline which is around 4-5.  Appreciate Nephrology input. Plan is to initiate dialysis in near future. Continue oral Lasix. On Bicarb tablets. For vascular access (fistula) placement on 11/24.  History of cardiomyopathy with last EF was 30% Appears to be well compensated. Not much pedal edema. Closely follow intake output and metabolic panel. Patient is on Lasix and ACEI.   Chronic anemia probably from kidney disease  Stable. Closely follow Hgb  Diabetes mellitus type 2 on diet control Stable  Incidental Cholelithiasis Can be monitored as OP.  DVT Prophylaxis: Heparin Code Status: Full code.  Family Communication: Discussed with patient Disposition Plan: Vascular procedure on 11/24. Cannot be discharged prior to that per Nephrologist due to compliance issues.    LOS: 6 days   [facility] Pager [phone] 04/17/2013, 10:57 AM  If 8PM-8AM, please contact night-coverage at [email or web address], password [facility]

## 2013-04-18 ENCOUNTER — Encounter (HOSPITAL_COMMUNITY): Payer: Medicaid Other | Admitting: Critical Care Medicine

## 2013-04-18 ENCOUNTER — Encounter (HOSPITAL_COMMUNITY): Payer: Self-pay | Admitting: Critical Care Medicine

## 2013-04-18 ENCOUNTER — Inpatient Hospital Stay (HOSPITAL_COMMUNITY): Payer: Medicaid Other | Admitting: Critical Care Medicine

## 2013-04-18 ENCOUNTER — Encounter (HOSPITAL_COMMUNITY)
Admission: EM | Disposition: A | Discharge status: Home / Self Care | Payer: Self-pay | Source: Home / Self Care | Attending: Internal Medicine

## 2013-04-18 ENCOUNTER — Other Ambulatory Visit: Payer: Self-pay | Admitting: *Deleted

## 2013-04-18 ENCOUNTER — Telehealth: Payer: Self-pay | Admitting: Vascular Surgery

## 2013-04-18 DIAGNOSIS — N186 End stage renal disease: Secondary | ICD-10-CM

## 2013-04-18 HISTORY — PX: AV FISTULA PLACEMENT: SHX1204

## 2013-04-18 LAB — BASIC METABOLIC PANEL
BUN: 87 mg/dL — ABNORMAL HIGH (ref 6–23)
Chloride: 93 mEq/L — ABNORMAL LOW (ref 96–112)
GFR calc Af Amer: 13 mL/min — ABNORMAL LOW (ref >90)
GFR calc non Af Amer: 11 mL/min — ABNORMAL LOW (ref >90)
Potassium: 3.7 mEq/L (ref 3.5–5.1)
Sodium: 134 mEq/L — ABNORMAL LOW (ref 135–145)

## 2013-04-18 LAB — GLUCOSE, CAPILLARY
Glucose-Capillary: 113 mg/dL — ABNORMAL HIGH (ref 70–99)
Glucose-Capillary: 162 mg/dL — ABNORMAL HIGH (ref 70–99)
Glucose-Capillary: 196 mg/dL — ABNORMAL HIGH (ref 70–99)
Glucose-Capillary: 113 mg/dL — ABNORMAL HIGH (ref 70–99)

## 2013-04-18 SURGERY — ARTERIOVENOUS (AV) FISTULA CREATION
Anesthesia: General | Site: Arm Upper | Laterality: Left | Wound class: Clean

## 2013-04-18 MED ORDER — PROTAMINE SULFATE 10 MG/ML IV SOLN
INTRAVENOUS | Status: DC | PRN
  Administered 2013-04-18 (×2): 25 mg via INTRAVENOUS

## 2013-04-18 MED ORDER — LIDOCAINE HCL (PF) 1 % IJ SOLN
INTRAMUSCULAR | Status: DC | PRN
  Administered 2013-04-18: 30 mL

## 2013-04-18 MED ORDER — FENTANYL CITRATE 0.05 MG/ML IJ SOLN: INTRAMUSCULAR | Status: DC | PRN

## 2013-04-18 MED ORDER — ONDANSETRON HCL 4 MG/2ML IJ SOLN
INTRAMUSCULAR | Status: DC | PRN
  Administered 2013-04-18: 4 mg via INTRAVENOUS

## 2013-04-18 MED ORDER — PHENYLEPHRINE HCL 10 MG/ML IJ SOLN
INTRAMUSCULAR | Status: DC | PRN
  Administered 2013-04-18: 40 ug via INTRAVENOUS
  Administered 2013-04-18: 120 ug via INTRAVENOUS
  Administered 2013-04-18 (×2): 40 ug via INTRAVENOUS
  Administered 2013-04-18 (×2): 80 ug via INTRAVENOUS

## 2013-04-18 MED ORDER — EPHEDRINE SULFATE 50 MG/ML IJ SOLN
INTRAMUSCULAR | Status: DC | PRN
  Administered 2013-04-18: 10 mg via INTRAVENOUS
  Administered 2013-04-18 (×3): 5 mg via INTRAVENOUS

## 2013-04-18 MED ORDER — LIDOCAINE HCL (CARDIAC) 20 MG/ML IV SOLN
INTRAVENOUS | Status: DC | PRN
  Administered 2013-04-18: 30 mg via INTRAVENOUS

## 2013-04-18 MED ORDER — MIDAZOLAM HCL 5 MG/5ML IJ SOLN
INTRAMUSCULAR | Status: DC | PRN
  Administered 2013-04-18: 2 mg via INTRAVENOUS

## 2013-04-18 MED ORDER — THROMBIN 20000 UNITS EX SOLR
CUTANEOUS | Status: AC
  Filled 2013-04-18: qty 20000

## 2013-04-18 MED ORDER — SODIUM CHLORIDE 0.9 % IR SOLN
Status: DC | PRN
  Administered 2013-04-18: 11:00:00

## 2013-04-18 MED ORDER — PROPOFOL 10 MG/ML IV BOLUS
INTRAVENOUS | Status: DC | PRN
  Administered 2013-04-18: 170 mg via INTRAVENOUS
  Administered 2013-04-18: 30 mg via INTRAVENOUS

## 2013-04-18 MED ORDER — FENTANYL CITRATE 0.05 MG/ML IJ SOLN
INTRAMUSCULAR | Status: DC | PRN
  Administered 2013-04-18: 50 ug via INTRAVENOUS
  Administered 2013-04-18 (×3): 25 ug via INTRAVENOUS
  Administered 2013-04-18: 50 ug via INTRAVENOUS
  Administered 2013-04-18: 25 ug via INTRAVENOUS
  Administered 2013-04-18: 50 ug via INTRAVENOUS

## 2013-04-18 MED ORDER — PHENYLEPHRINE HCL 10 MG/ML IJ SOLN
10.0000 mg | INTRAVENOUS | Status: DC | PRN
  Administered 2013-04-18: 75 ug/min via INTRAVENOUS

## 2013-04-18 MED ORDER — MORPHINE SULFATE 2 MG/ML IJ SOLN
2.0000 mg | INTRAMUSCULAR | Status: DC | PRN
  Administered 2013-04-18 – 2013-04-19 (×2): 2 mg via INTRAVENOUS
  Filled 2013-04-18 (×2): qty 1

## 2013-04-18 MED ORDER — OXYCODONE HCL 5 MG PO TABS: 5.0000 mg | ORAL_TABLET | ORAL | Status: DC | PRN

## 2013-04-18 MED ORDER — LIDOCAINE HCL (PF) 1 % IJ SOLN
INTRAMUSCULAR | Status: AC
  Filled 2013-04-18: qty 30

## 2013-04-18 MED ORDER — SODIUM CHLORIDE 0.9 % IV SOLN
Freq: Once | INTRAVENOUS | Status: AC
  Administered 2013-04-18: 08:00:00 via INTRAVENOUS

## 2013-04-18 MED ORDER — HEPARIN SODIUM (PORCINE) 1000 UNIT/ML IJ SOLN
INTRAMUSCULAR | Status: DC | PRN
  Administered 2013-04-18: 5000 [IU] via INTRAVENOUS

## 2013-04-18 MED ORDER — HYDROMORPHONE HCL PF 1 MG/ML IJ SOLN: 0.2500 mg | INTRAMUSCULAR | Status: DC | PRN

## 2013-04-18 MED ORDER — SODIUM CHLORIDE 0.9 % IV SOLN
INTRAVENOUS | Status: DC | PRN
  Administered 2013-04-18 (×2): via INTRAVENOUS

## 2013-04-18 MED ORDER — 0.9 % SODIUM CHLORIDE (POUR BTL) OPTIME
TOPICAL | Status: DC | PRN
  Administered 2013-04-18: 1000 mL

## 2013-04-18 MED ORDER — ONDANSETRON HCL 4 MG/2ML IJ SOLN: 4.0000 mg | Freq: Once | INTRAMUSCULAR | Status: DC | PRN

## 2013-04-18 SURGICAL SUPPLY — 39 items
ARMBAND PINK RESTRICT EXTREMIT (MISCELLANEOUS) ×3 IMPLANT
CANISTER SUCTION 2500CC (MISCELLANEOUS) ×3 IMPLANT
CLIP TI MEDIUM 6 (CLIP) ×3 IMPLANT
CLIP TI WIDE RED SMALL 6 (CLIP) ×3 IMPLANT
COVER SURGICAL LIGHT HANDLE (MISCELLANEOUS) ×3 IMPLANT
DECANTER SPIKE VIAL GLASS SM (MISCELLANEOUS) ×3 IMPLANT
DERMABOND ADVANCED (GAUZE/BANDAGES/DRESSINGS) ×1
DERMABOND ADVANCED .7 DNX12 (GAUZE/BANDAGES/DRESSINGS) ×2 IMPLANT
ELECT REM PT RETURN 9FT ADLT (ELECTROSURGICAL) ×3
ELECTRODE REM PT RTRN 9FT ADLT (ELECTROSURGICAL) ×2 IMPLANT
GEL ULTRASOUND 20GR AQUASONIC (MISCELLANEOUS) IMPLANT
GLOVE BIO SURGEON STRL SZ 6.5 (GLOVE) ×6 IMPLANT
GLOVE BIO SURGEON STRL SZ7.5 (GLOVE) ×3 IMPLANT
GLOVE BIOGEL PI IND STRL 6.5 (GLOVE) ×4 IMPLANT
GLOVE BIOGEL PI IND STRL 7.0 (GLOVE) ×2 IMPLANT
GLOVE BIOGEL PI INDICATOR 6.5 (GLOVE) ×2
GLOVE BIOGEL PI INDICATOR 7.0 (GLOVE) ×1
GLOVE SS BIOGEL STRL SZ 6.5 (GLOVE) ×2 IMPLANT
GLOVE SUPERSENSE BIOGEL SZ 6.5 (GLOVE) ×1
GLOVE SURG SS PI 6.5 STRL IVOR (GLOVE) ×3 IMPLANT
GOWN PREVENTION PLUS XLARGE (GOWN DISPOSABLE) ×3 IMPLANT
GOWN STRL NON-REIN LRG LVL3 (GOWN DISPOSABLE) ×9 IMPLANT
KIT BASIN OR (CUSTOM PROCEDURE TRAY) ×3 IMPLANT
KIT ROOM TURNOVER OR (KITS) ×3 IMPLANT
LOOP VESSEL MINI RED (MISCELLANEOUS) IMPLANT
NEEDLE HYPO 25GX1X1/2 BEV (NEEDLE) ×3 IMPLANT
NS IRRIG 1000ML POUR BTL (IV SOLUTION) ×3 IMPLANT
PACK CV ACCESS (CUSTOM PROCEDURE TRAY) ×3 IMPLANT
PAD ARMBOARD 7.5X6 YLW CONV (MISCELLANEOUS) ×6 IMPLANT
SPONGE SURGIFOAM ABS GEL 100 (HEMOSTASIS) IMPLANT
SUT PROLENE 6 0 CC (SUTURE) ×6 IMPLANT
SUT PROLENE 7 0 BV 1 (SUTURE) ×3 IMPLANT
SUT VIC AB 3-0 SH 27 (SUTURE) ×2
SUT VIC AB 3-0 SH 27X BRD (SUTURE) ×4 IMPLANT
SUT VICRYL 4-0 PS2 18IN ABS (SUTURE) ×6 IMPLANT
TOWEL OR 17X24 6PK STRL BLUE (TOWEL DISPOSABLE) ×3 IMPLANT
TOWEL OR 17X26 10 PK STRL BLUE (TOWEL DISPOSABLE) ×3 IMPLANT
UNDERPAD 30X30 INCONTINENT (UNDERPADS AND DIAPERS) ×3 IMPLANT
WATER STERILE IRR 1000ML POUR (IV SOLUTION) ×3 IMPLANT

## 2013-04-18 NOTE — Interval H&P Note (Signed)
History and Physical Interval Note:  04/18/2013 9:55 AM  Joshua [name]  has presented today for surgery, with the diagnosis of ESRD  The various methods of treatment have been discussed with the patient and family. After consideration of risks, benefits and other options for treatment, the patient has consented to  Procedure(s): INSERTION OF ARTERIOVENOUS (AV) GORE-TEX GRAFT ARM (Left) as a surgical intervention .  The patient's history has been reviewed, patient examined, no change in status, stable for surgery.  I have reviewed the patient's chart and labs.  Questions were answered to the patient's satisfaction.     [name]

## 2013-04-18 NOTE — Progress Notes (Signed)
Patient back from cone, post AV-fistula placement, patient alert and oriented C/O pain on the left arm, PRN pain medication given, no distress noted, surgical incision clean and intact. Positive CMS, vital WNL.  Will continue to assess patient.

## 2013-04-18 NOTE — Op Note (Signed)
Procedure: Left Brachial Cephalic AV fistula  Preop: ESRD  Postop: ESRD  Anesthesia: General  Assistant:Regina Roczniak  Findings: 3.5 mm cephalic vein  Procedure: After obtaining informed consent, the patient was taken to the operating room.  After induction of general anesthesia, the left upper extremity was prepped and draped in usual sterile fashion.  A transverse incision was then made near the antecubital crease the left arm. The incision was carried into the subcutaneous tissues down to level of the cephalic vein. The cephalic vein was approximately 3-3.5 mm in diameter. It was of good quality. This was dissected free circumferentially and small side branches ligated and divided between silk ties or clips. Next the brachial artery was dissected free in the medial portion of the incision. The artery was  3-4 mm in diameter. The vessel loops were placed proximal and distal to the planned site of arteriotomy. The patient was given 5000 units of intravenous heparin. After appropriate circulation time, the vessel loops were used to control the artery. A longitudinal opening was made in the brachial artery.  The vein was ligated distally with a 2-0 silk tie. The vein was controlled proximally with a fine bulldog clamp. The vein was then swung over to the artery and sewn end of vein to side of artery using a running 7-0 Prolene suture. Just prior to completion of the anastomosis, everything was fore bled back bled and thoroughly flushed. The anastomosis was secured, vessel loops released, and there was a palpable thrill in the fistula immediately. After hemostasis was obtained, the subcutaneous tissues were reapproximated using a running 3-0 Vicryl suture. The skin was then closed with a 4 Vicryl subcuticular stitch. Dermabond was applied to the skin incision.  The patient had a palpable radial pulse at the end of the case.  Ruta Hinds, MD [facility] Office:  [phone] Pager: [phone]

## 2013-04-18 NOTE — Anesthesia Preprocedure Evaluation (Addendum)
Anesthesia Evaluation  Patient identified by MRN, date of birth, ID band Patient awake    Reviewed: Allergy & Precautions, H&P , NPO status , Patient's Chart, lab work & pertinent test results, reviewed documented beta blocker date and time   Airway Mallampati: II TM Distance: >3 FB Neck ROM: Full    Dental  (+) Dental Advisory Given and Teeth Intact   Pulmonary sleep apnea , former smoker,  breath sounds clear to auscultation        Cardiovascular hypertension, Pt. on medications and Pt. on home beta blockers + CAD and +CHF + dysrhythmias Atrial Fibrillation Rhythm:Regular Rate:Normal     Neuro/Psych  Headaches,    GI/Hepatic   Endo/Other  diabetes  Renal/GU ESRFRenal disease     Musculoskeletal   Abdominal   Peds  Hematology  (+) anemia ,   Anesthesia Other Findings   Reproductive/Obstetrics                          Anesthesia Physical Anesthesia Plan  ASA: III  Anesthesia Plan: General   Post-op Pain Management:    Induction: Intravenous  Airway Management Planned: LMA  Additional Equipment:   Intra-op Plan:   Post-operative Plan:   Informed Consent: I have reviewed the patients History and Physical, chart, labs and discussed the procedure including the risks, benefits and alternatives for the proposed anesthesia with the patient or authorized representative who has indicated his/her understanding and acceptance.   Dental advisory given  Plan Discussed with: CRNA and Anesthesiologist  Anesthesia Plan Comments: (ESRD has not started HD K-3.7 Type 2 115 Hypertension admitted with hypertensive crisis 04/11/2013 Sleep apnea not on CPAP  Plan GA with LMA  Roberts Gaudy, MD)        Anesthesia Quick Evaluation

## 2013-04-18 NOTE — H&P (View-Only) (Signed)
[facility] [place]  Referred by:  Roney Jaffe, MD  Reason for referral: New access  History of Present Illness  Joshua [name] is a 32 y.o. (1980/12/31) male who presents for evaluation for permanent access.  The patient is right hand dominant.  The patient has not had previous access procedures.  Previous central venous cannulation procedures include: nonw.  The patient has never had a PPM placed. CKD Stage IV-V felt due to DM and poorly controlled HTN.  Past Medical History  Diagnosis Date  . Arthritis   . CKD (chronic kidney disease) stage 4, GFR 15-29 ml/min   . Hypertension     Hypertensive urgency/emergency - no RAS, normal aldo/PRA ratio,high urine metanephrines and normetanephrines   . Atrial fibrillation   . Type II diabetes mellitus   . History of bronchitis   . Sleep apnea     No CPAP  . Headache(784.0)     Regular  . Anemia of chronic disease     Past Surgical History  Procedure Laterality Date  . No past surgeries      History   Social History  . Marital Status: Single    Spouse Name: N/A    Number of Children: N/A  . Years of Education: N/A   Occupational History  . Not on file.   Social History Main Topics  . Smoking status: Former Smoker -- .1 years    Types: Cigarettes    Quit date: 09/24/1999  . Smokeless tobacco: Never Used  . Alcohol Use: No  . Drug Use: No  . Sexual Activity: Yes   Other Topics Concern  . Not on file   Social History Narrative  . No narrative on file    Family History  Problem Relation Age of Onset  . Diabetes Father   . Hypertension Father   . Hypertension Maternal Grandmother   . Hypertension Maternal Grandfather   . Kidney disease Maternal Grandfather   . Diabetes Maternal Grandfather     No current facility-administered medications on file prior to encounter.   Current Outpatient Prescriptions on File Prior to Encounter  Medication Sig Dispense Refill  . aspirin 325 MG tablet Take  325 mg by mouth daily.      . ferrous sulfate 325 (65 FE) MG tablet Take 325 mg by mouth 2 (two) times daily.      . furosemide (LASIX) 80 MG tablet Take 1 tablet (80 mg total) by mouth 2 (two) times daily.  60 tablet  0  . isosorbide mononitrate (IMDUR) 30 MG 24 hr tablet Take 1 tablet (30 mg total) by mouth daily.  30 tablet  2  . lisinopril (PRINIVIL,ZESTRIL) 5 MG tablet Take 1 tablet (5 mg total) by mouth 2 (two) times daily.  60 tablet  2    No Known Allergies  REVIEW OF SYSTEMS:  (Positives checked otherwise negative)  CARDIOVASCULAR:  []  chest pain, []  chest pressure, []  palpitations, [x]  shortness of breath when laying flat, [x]  shortness of breath with exertion,  []  pain in feet when walking, []  pain in feet when laying flat, []  history of blood clot in veins (DVT), []  history of phlebitis, [x]  swelling in legs, []  varicose veins  PULMONARY:  []  productive cough, []  asthma, []  wheezing  NEUROLOGIC:  []  weakness in arms or legs, []  numbness in arms or legs, []  difficulty speaking or slurred speech, []  temporary loss of vision in one eye, []  dizziness  HEMATOLOGIC:  []  bleeding problems, []   problems with blood clotting too easily  MUSCULOSKEL:  []  joint pain, []  joint swelling  GASTROINTEST:  []  vomiting blood, []  blood in stool     GENITOURINARY:  []  burning with urination, []  blood in urine  PSYCHIATRIC:  []  history of major depression  INTEGUMENTARY:  []  rashes, []  ulcers  CONSTITUTIONAL:  []  fever, []  chills  Physical Examination  Filed Vitals:   04/13/13 1300 04/13/13 1400 04/13/13 1500 04/13/13 1600  BP: 166/105 195/126 168/110 158/101  Pulse: 76 82 75 75  Temp:    97.8 F (36.6 C)  TempSrc:    Oral  Resp: 10 16 14 11   Height:      Weight:      SpO2: 100% 100% 100% 100%   Body mass index is 37.81 kg/(m^2).  General: A&O x 3, WD, obese  Head: [place]/AT  Ear/Nose/Throat: Hearing grossly intact, nares w/o erythema or drainage, oropharynx w/o Erythema/Exudate,  Mallampati score: 3  Eyes: PERRLA, EOMI  Neck: Supple, no nuchal rigidity, no palpable LAD  Pulmonary: Sym exp, good air movt, CTAB, no rales, rhonchi, & wheezing  Cardiac: RRR, Nl S1, S2, no Murmurs, rubs or gallops  Vascular: Vessel Right Left  Radial Palpable Palpable  Ulnar Faintly Palpable Faintly Palpable  Brachial Palpable Palpable  Carotid Palpable, without bruit Palpable, without bruit  Aorta Not palpable N/A  Femoral Palpable Palpable  Popliteal Not palpable Not palpable  PT Palpable Palpable  DP Palpable Palpable   Gastrointestinal: soft, NTND, -G/R, - HSM, - masses, - CVAT B  Musculoskeletal: M/S 5/5 throughout , Extremities without ischemic changes   Neurologic: CN 2-12 intact , Pain and light touch intact in extremities , Motor exam as listed above  Psychiatric: Judgment intact, Mood & affect appropriate for pt's clinical situation  Dermatologic: See M/S exam for extremity exam, no rashes otherwise noted  Lymph : No Cervical, Axillary, or Inguinal lymphadenopathy   Laboratory: CBC:    Component Value Date/Time   WBC 13.3* 04/12/2013 0333   RBC 3.74* 04/12/2013 0333   RBC 4.40 09/26/2012 1250   HGB 8.5* 04/12/2013 0333   HCT 25.7* 04/12/2013 0333   PLT 252 04/12/2013 0333   MCV 68.7* 04/12/2013 0333   MCH 22.7* 04/12/2013 0333   MCHC 33.1 04/12/2013 0333   RDW 22.7* 04/12/2013 0333   LYMPHSABS 1.5 04/11/2013 2200   MONOABS 0.6 04/11/2013 2200   EOSABS 0.0 04/11/2013 2200   BASOSABS 0.0 04/11/2013 2200    BMP:    Component Value Date/Time   NA 137 04/12/2013 0333   NA 135* 07/29/2012   K 3.8 04/12/2013 0333   CL 100 04/12/2013 0333   CO2 19 04/12/2013 0333   GLUCOSE 147* 04/12/2013 0333   BUN 73* 04/12/2013 0333   BUN 3* 07/29/2012   CREATININE 5.80* 04/12/2013 0333   CREATININE 3.41* 09/02/2012 1734   CREATININE 4.0* 07/29/2012   CALCIUM 9.0 04/12/2013 0333   GFRNONAA 12* 04/12/2013 0333   GFRAA 14* 04/12/2013 0333    Coagulation: Lab  Results  Component Value Date   INR 1.27 01/25/2013   No results found for this basename: PTT     Non-Invasive Vascular Imaging  Vein Mapping  (Date: 04/13/2013):   R arm: acceptable vein conduits include none  L arm: acceptable vein conduits include none  Report finalized by myself.  Medical Decision Making  Joshua [name] is a 32 y.o. male who presents with CKD Stage IV, Hypertensive emergency, DM, CHF   Based on vein mapping and  examination, this patient's permanent access options include: L arm AVG vs brachial vein transposition.  Procedure dependent on intraoperative findings.  Pt may need tunneled dialysis catheter placement as bridge to permanent access if his renal function deteriorates  The patient has NOT decided on whether or not to proceed with hemodialysis, so he is not ready to decided on permanent access placement.  Let us known when and if the patient is willing to proceed with access placement and we will arrange for the procedure.  Further titration of HTN recommended as any procedure likely to be canceled by anesthesia if BP poorly controlled.  Adele Barthel, MD [facility] [place] Office: [phone] Pager: [phone]  04/13/2013, 5:19 PM

## 2013-04-18 NOTE — Progress Notes (Signed)
Report given to Essey rn at [facility] pt came from [id number]

## 2013-04-18 NOTE — Telephone Encounter (Addendum)
Message copied by Gena Fray on Mon Apr 18, 2013  2:17 PM ------      Message from: [place]: Mon Apr 18, 2013 11:57 AM       Left brachial cephalic AVF      Regina asst            Needs follow up 4-6 weeks with ultrasound at that  Time            Juanda Crumble             ------  04/18/13: lm for pt and mailed letter, dpm

## 2013-04-18 NOTE — Transfer of Care (Signed)
Immediate Anesthesia Transfer of Care Note  Patient: Joshua [name]  Procedure(s) Performed: Procedure(s): ARTERIOVENOUS (AV) FISTULA CREATION (Left)  Patient Location: PACU  Anesthesia Type:General  Level of Consciousness: awake, alert  and oriented  Airway & Oxygen Therapy: Patient Spontanous Breathing and Patient connected to nasal cannula oxygen  Post-op Assessment: Report given to PACU RN, Post -op Vital signs reviewed and stable and Patient moving all extremities X 4  Post vital signs: Reviewed and stable  Complications: No apparent anesthesia complications

## 2013-04-18 NOTE — Preoperative (Signed)
Beta Blockers   Reason not to administer Beta Blockers:Not Applicable 

## 2013-04-18 NOTE — Progress Notes (Signed)
[facility] PROGRESS NOTE  Joshua [name] [id number] DOB: Oct 06, 1980 DOA: 04/11/2013  PCP: No PCP per patient  Brief HPI: Joshua [name] is a 32 y.o. male with history of chronic kidney disease stage IV, hypertension, cardiomyopathy last EF was 30%, diabetes on diet, chronic anemia secondary to chronic kidney disease presented to the ER because of blurred vision headache and nausea vomiting. In the ER CT head was negative for acute findings. Chest x-ray showed improving congestion from previous. Patient's blood pressure was found to be very high and was started on Cardene infusion and has been admitted for hypertensive emergency.  Past medical history:  Past Medical History  Diagnosis Date  . Arthritis   . CKD (chronic kidney disease) stage 4, GFR 15-29 ml/min   . Hypertension     Hypertensive urgency/emergency - no RAS, normal aldo/PRA ratio,high urine metanephrines and normetanephrines   . Atrial fibrillation   . Type II diabetes mellitus   . History of bronchitis   . Sleep apnea     No CPAP  . Headache(784.0)     Regular  . Anemia of chronic disease     Consultants: Nephrology, Vascular  Procedures: None  Antibiotics: None  Subjective: Patient feels fine. No complaints offered. Waiting to go to [facility] for fistula placement.  Objective: Vital Signs  Filed Vitals:   04/17/13 2045 04/17/13 2135 04/18/13 0156 04/18/13 0518  BP: 142/89 138/83 124/49 126/74  Pulse: 72 70 68 67  Temp:  98.2 F (36.8 C)  98.2 F (36.8 C)  TempSrc:  Oral  Oral  Resp:  18 18 20   Height:      Weight:    113.671 kg (250 lb 9.6 oz)  SpO2:  100%  96%    Filed Weights   04/16/13 0655 04/17/13 0649 04/18/13 0518  Weight: 113.9 kg (251 lb 1.7 oz) 113.2 kg (249 lb 9 oz) 113.671 kg (250 lb 9.6 oz)   Intake/Output from previous day: 11/23 0701 - 11/24 0700 In: 1563 [P.O.:1560; I.V.:3] Out: 2750 [Urine:2750]  General appearance: alert, cooperative, appears stated age and no  distress Resp: clear to auscultation bilaterally Cardio: regular rate and rhythm, S1, S2 normal, no murmur, click, rub or gallop GI: soft, non-tender; bowel sounds normal; no masses,  no organomegaly Extremities: extremities normal, atraumatic, no cyanosis or edema Neurologic: Alert and oriented x 3. No focal deficits.  Lab Results:  Basic Metabolic Panel:  Recent Labs Lab 04/11/13 2200 04/11/13 2210 04/12/13 0333 04/13/13 1016 04/14/13 0326 04/15/13 0355 04/18/13 0423  NA 133* 138 137  --  134* 133* 134*  K 4.0 3.9 3.8  --  3.5 3.6 3.7  CL 97 104 100  --  96 96 93*  CO2 17*  --  19  --  21 22 26   GLUCOSE 167* 162* 147*  --  117* 104* 115*  BUN 74* 75* 73*  --  72* 74* 87*  CREATININE 5.63* 6.20* 5.80*  --  5.93* 5.95* 6.09*  CALCIUM 9.4  --  9.0  --  7.9* 8.7 8.7  PHOS  --   --   --  5.1*  --   --   --    Liver Function Tests:  Recent Labs Lab 04/11/13 2200 04/12/13 0333  AST 34 27  ALT 54* 51  ALKPHOS 229* 216*  BILITOT 2.0* 1.7*  PROT 8.1 7.5  ALBUMIN 3.6 3.4*    Recent Labs Lab 04/11/13 2200  LIPASE 21   CBC:  Recent Labs  Lab 04/11/13 2200 04/11/13 2210 04/12/13 0333 04/14/13 0326  WBC 15.0*  --  13.3* 13.4*  NEUTROABS 12.9*  --   --   --   HGB 8.8* 9.9* 8.5* 8.5*  HCT 26.6* 29.0* 25.7* 25.5*  MCV 68.4*  --  68.7* 68.9*  PLT 271  --  252 238   Cardiac Enzymes:  Recent Labs Lab 04/11/13 2200  TROPONINI <0.30   BNP (last 3 results)  Recent Labs  01/06/13 1847 01/24/13 0720  PROBNP 61864.0* >70000.0*   CBG:  Recent Labs Lab 04/16/13 2136 04/17/13 0732 04/17/13 1239 04/17/13 1627 04/17/13 2133  GLUCAP 152* 116* 108* 127* 128*    Recent Results (from the past 240 hour(s))  MRSA PCR SCREENING     Status: None   Collection Time    04/12/13  2:26 AM      Result Value Range Status   MRSA by PCR NEGATIVE  NEGATIVE Final   Comment:            The GeneXpert MRSA Assay (FDA     approved for NASAL specimens     only), is one  component of a     comprehensive MRSA colonization     surveillance program. It is not     intended to diagnose MRSA     infection nor to guide or     monitor treatment for     MRSA infections.      Studies/Results: No results found.  Medications:  Scheduled: . amLODipine  10 mg Oral Daily  . cefUROXime (ZINACEF)  IV  1.5 g Intravenous 60 min Pre-Op  . cloNIDine  0.2 mg Oral TID  . ferric gluconate (FERRLECIT/NULECIT) IV  250 mg Intravenous Q48H  . ferrous sulfate  325 mg Oral BID  . furosemide  160 mg Oral BID  . heparin  5,000 Units Subcutaneous Q8H  . hydrALAZINE  100 mg Oral Q8H  . insulin aspart  0-9 Units Subcutaneous TID WC  . lisinopril  40 mg Oral QHS  . metoprolol tartrate  100 mg Oral BID  . minoxidil  5 mg Oral BID  . sodium bicarbonate  650 mg Oral BID  . sodium chloride  3 mL Intravenous Q12H  . sodium chloride  3 mL Intravenous Q12H   Continuous:   [id number], acetaminophen, labetalol, ondansetron (ZOFRAN) IV, ondansetron, traMADol  Assessment/Plan:  Principal Problem:   Hypertensive emergency Active Problems:   Diabetes   Chronic kidney disease (CKD), stage IV (severe)   Anemia in chronic kidney disease   Acute on chronic renal failure    Hypertensive emergency/Accelerated HTN HTN emergency has resolved. BP is better controlled. Probably secondary to non compliance to medications. He has been off of Cardene since 11/19. Doses of Metoprolol and Lisinopril were increased 11/21. Continue Hydralazine and Amlodipine. Clonidine was added 11/22. Minoxidil added 11/23.   Acute on Chronic kidney disease stage IV with mild metabolic acidosis Creatinine much higher than his baseline which is around 4-5. Appreciate Nephrology input. Plan is to initiate dialysis in near future. Continue oral Lasix. On Bicarb tablets. For vascular access (fistula) placement on 11/24.  Nausea and vomiting with leukocytosis Patient is afebrile. UA shows no infection. US  abdomen shows cholelithiasis but no evidence for cholecystitis. No further nausea. Nausea was likely secondary to elevated BP.   History of cardiomyopathy with last EF of 30% Appears to be well compensated. Not much pedal edema. Closely follow intake output and metabolic panel. Patient is on Lasix  and ACEI.   Chronic anemia probably from kidney disease Stable. Closely follow Hgb  Diabetes mellitus type 2 on diet control Stable  Incidental Cholelithiasis Can be monitored as OP.  DVT Prophylaxis: Heparin Code Status: Full code.  Family Communication: Discussed with patient Disposition Plan: Vascular procedure on 11/24. Plan discharge 11/25 if he remains stable.    LOS: 7 days   [facility] Pager [phone] 04/18/2013, 7:32 AM  If 8PM-8AM, please contact night-coverage at [email or web address], password [facility]

## 2013-04-19 DIAGNOSIS — I129 Hypertensive chronic kidney disease with stage 1 through stage 4 chronic kidney disease, or unspecified chronic kidney disease: Secondary | ICD-10-CM | POA: Diagnosis present

## 2013-04-19 LAB — CBC
HCT: 31.4 % — ABNORMAL LOW (ref 39.0–52.0)
MCHC: 32.5 g/dL (ref 30.0–36.0)
Platelets: 198 10*3/uL (ref 150–400)
RDW: 22.4 % — ABNORMAL HIGH (ref 11.5–15.5)
WBC: 12.8 10*3/uL — ABNORMAL HIGH (ref 4.0–10.5)

## 2013-04-19 LAB — BASIC METABOLIC PANEL
BUN: 89 mg/dL — ABNORMAL HIGH (ref 6–23)
GFR calc Af Amer: 12 mL/min — ABNORMAL LOW (ref >90)
GFR calc non Af Amer: 10 mL/min — ABNORMAL LOW (ref >90)
Glucose, Bld: 109 mg/dL — ABNORMAL HIGH (ref 70–99)
Potassium: 4.2 mEq/L (ref 3.5–5.1)
Sodium: 134 mEq/L — ABNORMAL LOW (ref 135–145)

## 2013-04-19 LAB — GLUCOSE, CAPILLARY: Glucose-Capillary: 117 mg/dL — ABNORMAL HIGH (ref 70–99)

## 2013-04-19 MED ORDER — FUROSEMIDE 80 MG PO TABS: 160.0000 mg | ORAL_TABLET | Freq: Two times a day (BID) | ORAL | Status: DC

## 2013-04-19 MED ORDER — LISINOPRIL 20 MG PO TABS: 40.0000 mg | ORAL_TABLET | Freq: Two times a day (BID) | ORAL | Status: DC

## 2013-04-19 MED ORDER — MINOXIDIL 2.5 MG PO TABS: 5.0000 mg | ORAL_TABLET | Freq: Two times a day (BID) | ORAL | Status: DC

## 2013-04-19 MED ORDER — METOPROLOL TARTRATE 100 MG PO TABS: 100.0000 mg | ORAL_TABLET | Freq: Every day | ORAL | Status: DC

## 2013-04-19 MED ORDER — AMLODIPINE BESYLATE 10 MG PO TABS: 10.0000 mg | ORAL_TABLET | Freq: Every day | ORAL | Status: DC

## 2013-04-19 MED ORDER — HYDRALAZINE HCL 100 MG PO TABS: 100.0000 mg | ORAL_TABLET | Freq: Three times a day (TID) | ORAL | Status: DC

## 2013-04-19 MED ORDER — SODIUM BICARBONATE 650 MG PO TABS: 650.0000 mg | ORAL_TABLET | Freq: Two times a day (BID) | ORAL | Status: DC

## 2013-04-19 MED ORDER — CLONIDINE HCL 0.2 MG PO TABS: 0.2000 mg | ORAL_TABLET | Freq: Three times a day (TID) | ORAL | Status: DC

## 2013-04-19 MED ORDER — TRAMADOL HCL 50 MG PO TABS: 50.0000 mg | ORAL_TABLET | Freq: Four times a day (QID) | ORAL | Status: DC | PRN

## 2013-04-19 NOTE — Discharge Summary (Signed)
[facility]  Physician Discharge Summary   Patient ID: Joshua [name] MRN: [id number] DOB/AGE: [birth year] 32 y.o.  Admit date: 04/11/2013 Discharge date: 04/19/2013  PCP: None. Followed by Nephrologist (Dr. Moshe Cipro).  DISCHARGE DIAGNOSES:  Active Problems:   Diabetes   Chronic kidney disease (CKD), stage IV (severe)   Anemia in chronic kidney disease   Acute on chronic renal failure   Hypertension, renal disease   RECOMMENDATIONS FOR OUTPATIENT FOLLOW UP: 1. Patient to have close follow up with Nephrology to consider initiating dialysis.  2. Patient to follow up with Vascular  DISCHARGE CONDITION: fair  Diet recommendation: Renal  Filed Weights   04/17/13 0649 04/18/13 0518 04/19/13 0530  Weight: 113.2 kg (249 lb 9 oz) 113.671 kg (250 lb 9.6 oz) 114.4 kg (252 lb 3.3 oz)    INITIAL HISTORY: Joshua [name] is a 32 y.o. male with history of chronic kidney disease stage IV, hypertension, cardiomyopathy last EF was 30%, diabetes on diet, chronic anemia secondary to chronic kidney disease presented to the ER because of blurred vision headache and nausea vomiting. In the ER CT head was negative for acute findings. Chest x-ray showed improving congestion from previous. Patient's blood pressure was found to be very high and was started on Cardene infusion and has been admitted for hypertensive emergency.  Consultations:  Nephrology  Procedures:  Left Brachial Cephalic AV Fistula [id number]  HOSPITAL COURSE:   Hypertensive emergency/Accelerated HTN  Patient was admitted and started on cardene infusion. He was initiated on his oral agents. He was weaned off of cardene. His medications were adjusted by Nephrology. He will be discharged on multiple anti hypertensive medications. Compliance was emphasized to patient. His symptoms have resolved.   Acute on Chronic kidney disease stage IV with mild metabolic acidosis  Creatinine much higher than his baseline which is around  4-5. He was seen by Nephrology. Plan is to initiate dialysis in near future. Continue oral Lasix and Bicarb tablets. He had a AV fistula placed 11/24. He will follow up with his Nephrologist to initiate dialysis. He is making adequate urine.   Nausea and vomiting with leukocytosis  These symptoms were likely due to elevated BP. They resolved once his BP was treated. UA shows no infection. US abdomen shows cholelithiasis but no evidence for cholecystitis. Reason for leukocytosis is not clear.  History of cardiomyopathy with last EF of 30%  Appears to be well compensated. No pedal edema noted. He has been in negative fluid balance. Continue Lasix and ACEI as per Nephrology.   Chronic anemia probably from kidney disease  Stable. Closely follow Hgb as OP.  Diabetes mellitus type 2 on diet control  Stable   Incidental Cholelithiasis  Can be monitored as OP.  Overall patient is stable. He is keen on going home. He can be discharged. He will follow up with nephrology.   PERTINENT LABS:  The results of significant diagnostics from this hospitalization (including imaging, microbiology, ancillary and laboratory) are listed below for reference.    Microbiology: Recent Results (from the past 240 hour(s))  MRSA PCR SCREENING     Status: None   Collection Time    04/12/13  2:26 AM      Result Value Range Status   MRSA by PCR NEGATIVE  NEGATIVE Final   Comment:            The GeneXpert MRSA Assay (FDA     approved for NASAL specimens     only), is one component of a  comprehensive MRSA colonization     surveillance program. It is not     intended to diagnose MRSA     infection nor to guide or     monitor treatment for     MRSA infections.     Labs: Basic Metabolic Panel:  Recent Labs Lab 04/13/13 1016 04/14/13 0326 04/15/13 0355 04/18/13 0423 04/19/13 0353  NA  --  134* 133* 134* 134*  K  --  3.5 3.6 3.7 4.2  CL  --  96 96 93* 92*  CO2  --  21 22 26 26   GLUCOSE  --  117*  104* 115* 109*  BUN  --  72* 74* 87* 89*  CREATININE  --  5.93* 5.95* 6.09* 6.64*  CALCIUM  --  7.9* 8.7 8.7 8.8  PHOS 5.1*  --   --   --   --    CBC:  Recent Labs Lab 04/14/13 0326 04/19/13 0353  WBC 13.4* 12.8*  HGB 8.5* 10.2*  HCT 25.5* 31.4*  MCV 68.9* 68.9*  PLT 238 198   BNP: BNP (last 3 results)  Recent Labs  01/06/13 1847 01/24/13 0720  PROBNP 61864.0* >70000.0*   CBG:  Recent Labs Lab 04/18/13 0732 04/18/13 1210 04/18/13 1641 04/18/13 2200 04/19/13 0738  GLUCAP 113* 162* 196* 113* 117*     IMAGING STUDIES Dg Chest 2 View  04/11/2013   CLINICAL DATA:  Chest pain.  EXAM: CHEST  2 VIEW  COMPARISON:  Chest radiograph February 02, 2013  FINDINGS: Cardiac silhouette remains moderately enlarged, mediastinal silhouette is nonsuspicious. Trace central pulmonary vasculature congestion, no pleural effusions or focal consolidations. No pneumothorax.  Multiple EKG lines overlie the patient and may obscure subtle underlying pathology. Larger body habitus. Osseous structures are nonsuspicious.  IMPRESSION: Improved appearance the chest in a stable cardiomegaly with decreased pulmonary edema, trace residual central pulmonary vasculature congestion. Spinal or   Electronically Signed   By: Elon Alas   On: 04/11/2013 22:38   Ct Head Wo Contrast  04/11/2013   CLINICAL DATA:  Posterior headache for 3 days. Chronic kidney disease. Hypertension.  EXAM: CT HEAD WITHOUT CONTRAST  TECHNIQUE: Contiguous axial images were obtained from the base of the skull through the vertex without intravenous contrast.  COMPARISON:  02/02/2013  FINDINGS: The brainstem, cerebellum, cerebral peduncles, thalamus, basal ganglia, basilar cisterns, and ventricular system appear within normal limits. The subtly accentuated white matter hypodensity in the parietal lobes and potentially in the cerebellum noted, similar to prior exams. No intracranial hemorrhage, mass lesion, or acute CVA.  IMPRESSION:  1. Similar appearance of equivocal white matter hypodensity posteriorly, but given the similar findings for the past 6 months this is likely incidental rather than a manifestation of posterior reversible encephalopathy syndrome. Followup MRI it may provide useful supplementary information, if clinically warranted.   Electronically Signed   By: Sherryl Barters M.D.   On: 04/11/2013 22:41   US Abdomen Complete  04/12/2013   CLINICAL DATA:  Nausea, vomiting.  EXAM: ULTRASOUND ABDOMEN COMPLETE  COMPARISON:  02/02/2013.  FINDINGS: Gallbladder  There are multiple cholelithiasis. The gallbladder wall is top normal in thickness measuring 3.4 mm, but the gallbladder is contracted which limits evaluation. There is no pericholecystic fluid. Negative sonographic Murphy sign.  Common bile duct  Diameter: 4.9 mm.  Liver  No focal lesion. The liver is increased in echogenicity as can be seen with hepatic steatosis. The liver is mildly enlarged measuring 19 cm.  IVC  No abnormality visualized.  Pancreas  Visualized portion unremarkable.  Spleen  No focal abnormality.  The spleen is enlarged measuring 13.7 cm.  Right Kidney  Length: 11.4 cm. Echogenicity within normal limits. No mass or hydronephrosis visualized.  Left Kidney  Length: 11 cm. Echogenicity within normal limits. No mass or hydronephrosis visualized.  Abdominal aorta  No aneurysm visualized.  IMPRESSION: 1. Cholelithiasis within a relatively contracted gallbladder. The gallbladder wall is top-normal in thickness likely secondary to a contracted state. There are no other sonographic findings to suggest cholecystitis.  2.  Hepatic steatosis.  3.  Mild hepatomegaly.  4.  Mild splenomegaly.   Electronically Signed   By: Kathreen Devoid   On: 04/12/2013 14:09    DISCHARGE EXAMINATION: Filed Vitals:   04/18/13 1843 04/18/13 2203 04/19/13 0125 04/19/13 0530  BP: 150/76 142/92 133/78 122/75  Pulse: 72 68 66 70  Temp: 97.4 F (36.3 C) 97.5 F (36.4 C)  98.3 F (36.8  C)  TempSrc: Oral Oral  Oral  Resp: 20 20  20   Height:      Weight:    114.4 kg (252 lb 3.3 oz)  SpO2: 100% 100%  100%   General appearance: alert, cooperative, appears stated age and no distress Resp: clear to auscultation bilaterally Cardio: regular rate and rhythm, S1, S2 normal, no murmur, click, rub or gallop Extremities: extremities normal, atraumatic, no cyanosis or edema  DISPOSITION: Home  Discharge Orders   Future Appointments Provider Department Dept Phone   06/02/2013 2:00 PM [facility] [place] [phone]   06/02/2013 3:15 PM Elam Dutch, MD [facility] [phone]   Future Orders Complete By Expires   Diet - low sodium heart healthy  As directed    Discharge instructions  As directed    Comments:     Please be sure to follow up with your kidney doctor. Take all the medications as prescribed. Seek attention if you develop shortness of breath, dizziness, weakness or if you feel you are not making adequate urine.   Increase activity slowly  As directed       ALLERGIES:  Allergies  Allergen Reactions  . Isosorbide Dinitrate     Severe headaches with po nitrates (imdur, isordil), do not prescribe  . Nitrates, Organic     Severe headaches with po nitrates (imdur, isordil, etc), do not prescribe    Current Discharge Medication List    START taking these medications   Details  amLODipine (NORVASC) 10 MG tablet Take 1 tablet (10 mg total) by mouth daily. Qty: 30 tablet, Refills: 2    cloNIDine (CATAPRES) 0.2 MG tablet Take 1 tablet (0.2 mg total) by mouth 3 (three) times daily. Qty: 90 tablet, Refills: 0    minoxidil (LONITEN) 2.5 MG tablet Take 2 tablets (5 mg total) by mouth 2 (two) times daily. Qty: 60 tablet, Refills: 0    sodium bicarbonate 650 MG tablet Take 1 tablet (650 mg total) by mouth 2 (two) times daily. Qty: 60 tablet, Refills: 0      CONTINUE these medications which have  CHANGED   Details  furosemide (LASIX) 80 MG tablet Take 2 tablets (160 mg total) by mouth 2 (two) times daily. Qty: 60 tablet, Refills: 0    hydrALAZINE (APRESOLINE) 100 MG tablet Take 1 tablet (100 mg total) by mouth 3 (three) times daily. Qty: 90 tablet, Refills: 1    lisinopril (PRINIVIL,ZESTRIL) 20 MG tablet Take 2 tablets (40 mg total) by mouth 2 (  two) times daily. Qty: 60 tablet, Refills: 0    metoprolol (LOPRESSOR) 100 MG tablet Take 1 tablet (100 mg total) by mouth daily. Qty: 60 tablet, Refills: 0    traMADol (ULTRAM) 50 MG tablet Take 1 tablet (50 mg total) by mouth every 6 (six) hours as needed for moderate pain. For pain Qty: 30 tablet, Refills: 0      CONTINUE these medications which have NOT CHANGED   Details  acetaminophen (TYLENOL) 500 MG tablet Take 1,000 mg by mouth every 6 (six) hours as needed. For pain    aspirin 325 MG tablet Take 325 mg by mouth daily.    ferrous sulfate 325 (65 FE) MG tablet Take 325 mg by mouth 2 (two) times daily.    ondansetron (ZOFRAN) 4 MG tablet Take 4 mg by mouth every 8 (eight) hours as needed for nausea or vomiting.      STOP taking these medications     isosorbide mononitrate (IMDUR) 30 MG 24 hr tablet        Follow-up Information   Follow up with Elam Dutch, MD In 4 weeks. (office will arrange -sent)    Specialty:  Vascular Surgery   Contact information:   [address] [place] [address] [phone]       Follow up with Corliss Parish A, MD. (Please call to find out data/time of your appointment)    Specialty:  Nephrology   Contact information:   [place] [address] [phone]       TOTAL DISCHARGE TIME: 67 mins  [facility] Pager [phone]  04/19/2013, 8:51 AM

## 2013-04-20 ENCOUNTER — Encounter (HOSPITAL_COMMUNITY): Payer: Self-pay | Admitting: Vascular Surgery

## 2013-04-22 NOTE — Anesthesia Postprocedure Evaluation (Signed)
  Anesthesia Post-op Note  Patient: Joshua [name]  Procedure(s) Performed: Procedure(s): ARTERIOVENOUS (AV) FISTULA CREATION (Left)  Patient Location: PACU  Anesthesia Type:General  Level of Consciousness: awake, alert  and oriented  Airway and Oxygen Therapy: Patient Spontanous Breathing and Patient connected to nasal cannula oxygen  Post-op Pain: mild  Post-op Assessment: Post-op Vital signs reviewed, Patient's Cardiovascular Status Stable, Respiratory Function Stable and Pain level controlled  Post-op Vital Signs: stable  Complications: No apparent anesthesia complications

## 2013-05-09 ENCOUNTER — Emergency Department (HOSPITAL_COMMUNITY): Payer: Medicaid Other

## 2013-05-09 ENCOUNTER — Inpatient Hospital Stay (HOSPITAL_COMMUNITY)
Admission: EM | Admit: 2013-05-09 | Discharge: 2013-05-17 | DRG: 286 | Disposition: A | Discharge status: Home / Self Care | Payer: Medicaid Other | Attending: Internal Medicine | Admitting: Internal Medicine

## 2013-05-09 ENCOUNTER — Encounter (HOSPITAL_COMMUNITY): Payer: Self-pay | Admitting: Emergency Medicine

## 2013-05-09 DIAGNOSIS — R339 Retention of urine, unspecified: Secondary | ICD-10-CM

## 2013-05-09 DIAGNOSIS — I5043 Acute on chronic combined systolic (congestive) and diastolic (congestive) heart failure: Secondary | ICD-10-CM

## 2013-05-09 DIAGNOSIS — R7401 Elevation of levels of liver transaminase levels: Secondary | ICD-10-CM

## 2013-05-09 DIAGNOSIS — E875 Hyperkalemia: Secondary | ICD-10-CM | POA: Diagnosis present

## 2013-05-09 DIAGNOSIS — I43 Cardiomyopathy in diseases classified elsewhere: Secondary | ICD-10-CM | POA: Diagnosis present

## 2013-05-09 DIAGNOSIS — Z91199 Patient's noncompliance with other medical treatment and regimen due to unspecified reason: Secondary | ICD-10-CM

## 2013-05-09 DIAGNOSIS — N179 Acute kidney failure, unspecified: Secondary | ICD-10-CM

## 2013-05-09 DIAGNOSIS — N189 Chronic kidney disease, unspecified: Secondary | ICD-10-CM

## 2013-05-09 DIAGNOSIS — I169 Hypertensive crisis, unspecified: Secondary | ICD-10-CM

## 2013-05-09 DIAGNOSIS — Z6833 Body mass index (BMI) 33.0-33.9, adult: Secondary | ICD-10-CM

## 2013-05-09 DIAGNOSIS — Z7982 Long term (current) use of aspirin: Secondary | ICD-10-CM

## 2013-05-09 DIAGNOSIS — E872 Acidosis, unspecified: Secondary | ICD-10-CM | POA: Diagnosis present

## 2013-05-09 DIAGNOSIS — E860 Dehydration: Secondary | ICD-10-CM

## 2013-05-09 DIAGNOSIS — Z87891 Personal history of nicotine dependence: Secondary | ICD-10-CM

## 2013-05-09 DIAGNOSIS — M129 Arthropathy, unspecified: Secondary | ICD-10-CM | POA: Diagnosis present

## 2013-05-09 DIAGNOSIS — I10 Essential (primary) hypertension: Secondary | ICD-10-CM | POA: Diagnosis present

## 2013-05-09 DIAGNOSIS — D696 Thrombocytopenia, unspecified: Secondary | ICD-10-CM | POA: Diagnosis present

## 2013-05-09 DIAGNOSIS — Z8249 Family history of ischemic heart disease and other diseases of the circulatory system: Secondary | ICD-10-CM

## 2013-05-09 DIAGNOSIS — Z833 Family history of diabetes mellitus: Secondary | ICD-10-CM

## 2013-05-09 DIAGNOSIS — I119 Hypertensive heart disease without heart failure: Secondary | ICD-10-CM

## 2013-05-09 DIAGNOSIS — Z9119 Patient's noncompliance with other medical treatment and regimen: Secondary | ICD-10-CM

## 2013-05-09 DIAGNOSIS — I4891 Unspecified atrial fibrillation: Secondary | ICD-10-CM | POA: Diagnosis present

## 2013-05-09 DIAGNOSIS — D509 Iron deficiency anemia, unspecified: Secondary | ICD-10-CM | POA: Diagnosis present

## 2013-05-09 DIAGNOSIS — N19 Unspecified kidney failure: Secondary | ICD-10-CM

## 2013-05-09 DIAGNOSIS — E119 Type 2 diabetes mellitus without complications: Secondary | ICD-10-CM

## 2013-05-09 DIAGNOSIS — I161 Hypertensive emergency: Secondary | ICD-10-CM

## 2013-05-09 DIAGNOSIS — N186 End stage renal disease: Secondary | ICD-10-CM | POA: Diagnosis present

## 2013-05-09 DIAGNOSIS — I2789 Other specified pulmonary heart diseases: Secondary | ICD-10-CM | POA: Diagnosis present

## 2013-05-09 DIAGNOSIS — N184 Chronic kidney disease, stage 4 (severe): Secondary | ICD-10-CM

## 2013-05-09 DIAGNOSIS — R079 Chest pain, unspecified: Secondary | ICD-10-CM

## 2013-05-09 DIAGNOSIS — E871 Hypo-osmolality and hyponatremia: Secondary | ICD-10-CM | POA: Diagnosis present

## 2013-05-09 DIAGNOSIS — K8020 Calculus of gallbladder without cholecystitis without obstruction: Secondary | ICD-10-CM

## 2013-05-09 DIAGNOSIS — R7989 Other specified abnormal findings of blood chemistry: Secondary | ICD-10-CM

## 2013-05-09 DIAGNOSIS — G4733 Obstructive sleep apnea (adult) (pediatric): Secondary | ICD-10-CM | POA: Diagnosis present

## 2013-05-09 DIAGNOSIS — D631 Anemia in chronic kidney disease: Secondary | ICD-10-CM

## 2013-05-09 DIAGNOSIS — IMO0001 Reserved for inherently not codable concepts without codable children: Principal | ICD-10-CM | POA: Diagnosis present

## 2013-05-09 DIAGNOSIS — N185 Chronic kidney disease, stage 5: Secondary | ICD-10-CM

## 2013-05-09 DIAGNOSIS — Z992 Dependence on renal dialysis: Secondary | ICD-10-CM

## 2013-05-09 DIAGNOSIS — I509 Heart failure, unspecified: Secondary | ICD-10-CM | POA: Diagnosis present

## 2013-05-09 LAB — HEPATIC FUNCTION PANEL
ALT: 500 U/L — ABNORMAL HIGH (ref 0–53)
AST: 294 U/L — ABNORMAL HIGH (ref 0–37)
Alkaline Phosphatase: 201 U/L — ABNORMAL HIGH (ref 39–117)
Indirect Bilirubin: 1.5 mg/dL — ABNORMAL HIGH (ref 0.3–0.9)
Total Protein: 7.7 g/dL (ref 6.0–8.3)

## 2013-05-09 LAB — CBC WITH DIFFERENTIAL/PLATELET
Basophils Relative: 1 % (ref 0–1)
Eosinophils Absolute: 0 10*3/uL (ref 0.0–0.7)
Eosinophils Relative: 0 % (ref 0–5)
Hemoglobin: 11 g/dL — ABNORMAL LOW (ref 13.0–17.0)
Lymphocytes Relative: 9 % — ABNORMAL LOW (ref 12–46)
MCH: 23.8 pg — ABNORMAL LOW (ref 26.0–34.0)
Monocytes Absolute: 1.5 10*3/uL — ABNORMAL HIGH (ref 0.1–1.0)
Neutro Abs: 13.4 10*3/uL — ABNORMAL HIGH (ref 1.7–7.7)
Neutrophils Relative %: 81 % — ABNORMAL HIGH (ref 43–77)
Platelets: 307 10*3/uL (ref 150–400)
RBC: 4.63 MIL/uL (ref 4.22–5.81)

## 2013-05-09 LAB — COMPREHENSIVE METABOLIC PANEL
ALT: 470 U/L — ABNORMAL HIGH (ref 0–53)
AST: 259 U/L — ABNORMAL HIGH (ref 0–37)
Alkaline Phosphatase: 205 U/L — ABNORMAL HIGH (ref 39–117)
CO2: 12 mEq/L — ABNORMAL LOW (ref 19–32)
Calcium: 9.3 mg/dL (ref 8.4–10.5)
Chloride: 94 mEq/L — ABNORMAL LOW (ref 96–112)
GFR calc Af Amer: 11 mL/min — ABNORMAL LOW (ref >90)
Glucose, Bld: 128 mg/dL — ABNORMAL HIGH (ref 70–99)
Potassium: 5.6 mEq/L — ABNORMAL HIGH (ref 3.5–5.1)
Sodium: 131 mEq/L — ABNORMAL LOW (ref 135–145)
Total Bilirubin: 2.6 mg/dL — ABNORMAL HIGH (ref 0.3–1.2)
Total Protein: 8.2 g/dL (ref 6.0–8.3)

## 2013-05-09 LAB — GLUCOSE, CAPILLARY: Glucose-Capillary: 139 mg/dL — ABNORMAL HIGH (ref 70–99)

## 2013-05-09 LAB — ACETAMINOPHEN LEVEL: Acetaminophen (Tylenol), Serum: <15 ug/mL (ref 10–30)

## 2013-05-09 LAB — MRSA PCR SCREENING: MRSA by PCR: NEGATIVE

## 2013-05-09 LAB — TROPONIN I: Troponin I: <0.3 ng/mL (ref <0.30)

## 2013-05-09 MED ORDER — CLONIDINE HCL 0.2 MG PO TABS: 0.2000 mg | ORAL_TABLET | Freq: Three times a day (TID) | ORAL | Status: DC

## 2013-05-09 MED ORDER — INSULIN ASPART 100 UNIT/ML ~~LOC~~ SOLN
0.0000 [IU] | Freq: Three times a day (TID) | SUBCUTANEOUS | Status: DC
  Administered 2013-05-09: 3 [IU] via SUBCUTANEOUS
  Administered 2013-05-11 (×2): 2 [IU] via SUBCUTANEOUS
  Administered 2013-05-12: 3 [IU] via SUBCUTANEOUS
  Administered 2013-05-12: 18:00:00 2 [IU] via SUBCUTANEOUS

## 2013-05-09 MED ORDER — NICARDIPINE HCL IN NACL 20-0.86 MG/200ML-% IV SOLN
10.0000 mg/h | Freq: Once | INTRAVENOUS | Status: AC
  Administered 2013-05-09: 10 mg/h via INTRAVENOUS
  Filled 2013-05-09: qty 200

## 2013-05-09 MED ORDER — MINOXIDIL 2.5 MG PO TABS: 5.0000 mg | ORAL_TABLET | Freq: Two times a day (BID) | ORAL | Status: DC

## 2013-05-09 MED ORDER — INSULIN ASPART 100 UNIT/ML ~~LOC~~ SOLN: 0.0000 [IU] | Freq: Every day | SUBCUTANEOUS | Status: DC

## 2013-05-09 MED ORDER — LABETALOL HCL 5 MG/ML IV SOLN
20.0000 mg | Freq: Once | INTRAVENOUS | Status: AC
  Administered 2013-05-09: 20 mg via INTRAVENOUS
  Filled 2013-05-09: qty 4

## 2013-05-09 MED ORDER — BIOTENE DRY MOUTH MT LIQD
15.0000 mL | Freq: Two times a day (BID) | OROMUCOSAL | Status: DC
  Administered 2013-05-09 – 2013-05-17 (×11): 15 mL via OROMUCOSAL

## 2013-05-09 MED ORDER — PNEUMOCOCCAL VAC POLYVALENT 25 MCG/0.5ML IJ INJ
0.5000 mL | INJECTION | INTRAMUSCULAR | Status: AC
  Administered 2013-05-10: 0.5 mL via INTRAMUSCULAR
  Filled 2013-05-09: qty 0.5

## 2013-05-09 MED ORDER — HEPARIN SODIUM (PORCINE) 5000 UNIT/ML IJ SOLN
5000.0000 [IU] | Freq: Three times a day (TID) | INTRAMUSCULAR | Status: DC
  Administered 2013-05-09 – 2013-05-12 (×10): 5000 [IU] via SUBCUTANEOUS
  Filled 2013-05-09 (×12): qty 1

## 2013-05-09 MED ORDER — ASPIRIN 325 MG PO TABS: 325.0000 mg | ORAL_TABLET | Freq: Every day | ORAL | Status: DC

## 2013-05-09 MED ORDER — HYDRALAZINE HCL 100 MG PO TABS: 100.0000 mg | ORAL_TABLET | Freq: Three times a day (TID) | ORAL | Status: DC

## 2013-05-09 MED ORDER — METOPROLOL TARTRATE 100 MG PO TABS: 100.0000 mg | ORAL_TABLET | Freq: Every day | ORAL | Status: DC

## 2013-05-09 MED ORDER — ASPIRIN 325 MG PO TABS
325.0000 mg | ORAL_TABLET | Freq: Every day | ORAL | Status: DC
  Administered 2013-05-09 – 2013-05-17 (×8): 325 mg via ORAL
  Filled 2013-05-09 (×9): qty 1

## 2013-05-09 MED ORDER — NICARDIPINE HCL IN NACL 20-0.86 MG/200ML-% IV SOLN
5.0000 mg/h | INTRAVENOUS | Status: DC
  Administered 2013-05-09 (×2): 5 mg/h via INTRAVENOUS
  Administered 2013-05-10: 15 mg/h via INTRAVENOUS
  Administered 2013-05-10: 5 mg/h via INTRAVENOUS
  Administered 2013-05-10: 15 mg/h via INTRAVENOUS
  Administered 2013-05-10: 5 mg/h via INTRAVENOUS
  Administered 2013-05-10: 15 mg/h via INTRAVENOUS
  Filled 2013-05-09 (×7): qty 200

## 2013-05-09 MED ORDER — SODIUM CHLORIDE 0.9 % IV SOLN
INTRAVENOUS | Status: DC
  Administered 2013-05-09: 12:00:00 via INTRAVENOUS

## 2013-05-09 MED ORDER — LISINOPRIL 40 MG PO TABS: 40.0000 mg | ORAL_TABLET | Freq: Two times a day (BID) | ORAL | Status: DC

## 2013-05-09 NOTE — Consult Note (Signed)
Reason for Consult: CKD 5- progression to ESRD Referring Physician: Doree Fudge, MD   HPI: 32 year old African American man with past medical history significant for hypertension that has been difficult to control in the recent past with recent admission to the hospital about 3 weeks ago for hypertensive emergency/accelerated hypertension when he had placement of a left upper arm (brachiocephalic) arteriovenous fistula in anticipation for dialysis in the near future. Since then, the patient has been seen once by Dr. Moshe Cipro for renal followup. He also has a history of congestive heart failure with an ejection fraction of 30% (August, 2014).  He presented again to the emergency room earlier today with complaints of headache and "feeling poorly" for the past 2 days. He denied any associated chest pain or shortness of breath. He denied any nausea, vomiting, abdominal pain or dysgeusia. Denies any changes with his mood or memory. Reports that headache is primarily frontal and not associated with photophobia or neck stiffness.  On admission found to have significantly elevated blood pressures yet again-systolic as high as 123456, diastolic as high as 123XX123. The patient insists that he has been compliant with his medications but does not have recall of what he takes. He was transferred to [facility] cone in anticipation for needing dialysis in the near future. Currently on nicardipine drip.   Past Medical History  Diagnosis Date  . Arthritis   . CKD (chronic kidney disease) stage 4, GFR 15-29 ml/min   . Hypertension     Hypertensive urgency/emergency - no RAS, normal aldo/PRA ratio,high urine metanephrines and normetanephrines   . Atrial fibrillation   . Type II diabetes mellitus   . History of bronchitis   . Sleep apnea     No CPAP  . Headache(784.0)     Regular  . Anemia of chronic disease     Past Surgical History  Procedure Laterality Date  . No past surgeries    . Av fistula  placement Left 04/18/2013    Procedure: ARTERIOVENOUS (AV) FISTULA CREATION;  Surgeon: Elam Dutch, MD;  Location: [facility] OR;  Service: Vascular;  Laterality: Left;    Family History  Problem Relation Age of Onset  . Diabetes Father   . Hypertension Father   . Hypertension Maternal Grandmother   . Hypertension Maternal Grandfather   . Kidney disease Maternal Grandfather   . Diabetes Maternal Grandfather     Social History:  reports that he quit smoking about 13 years ago. His smoking use included Cigarettes. He smoked 0.00 packs per day for .1 years. He has never used smokeless tobacco. He reports that he does not drink alcohol or use illicit drugs.  Allergies:  Allergies  Allergen Reactions  . Isosorbide Dinitrate     Severe headaches with po nitrates (imdur, isordil), do not prescribe  . Nitrates, Organic     Severe headaches with po nitrates (imdur, isordil, etc), do not prescribe    Medications:  Scheduled: . aspirin  325 mg Oral Daily  . heparin subcutaneous  5,000 Units Subcutaneous Q8H  . insulin aspart  0-15 Units Subcutaneous TID WC  . insulin aspart  0-5 Units Subcutaneous QHS    Results for orders placed during the hospital encounter of 05/09/13 (from the past 48 hour(s))  GLUCOSE, CAPILLARY     Status: Abnormal   Collection Time    05/09/13 11:00 AM      Result Value Range   Glucose-Capillary 132 (*) 70 - 99 mg/dL  CBC WITH DIFFERENTIAL  Status: Abnormal   Collection Time    05/09/13 11:22 AM      Result Value Range   WBC 16.6 (*) 4.0 - 10.5 K/uL   RBC 4.63  4.22 - 5.81 MIL/uL   Hemoglobin 11.0 (*) 13.0 - 17.0 g/dL   HCT 32.7 (*) 39.0 - 52.0 %   MCV 70.6 (*) 78.0 - 100.0 fL   MCH 23.8 (*) 26.0 - 34.0 pg   MCHC 33.6  30.0 - 36.0 g/dL   RDW 23.2 (*) 11.5 - 15.5 %   Platelets 307  150 - 400 K/uL   Comment: REPEATED TO VERIFY   Neutrophils Relative % 81 (*) 43 - 77 %   Lymphocytes Relative 9 (*) 12 - 46 %   Monocytes Relative 9  3 - 12 %    Eosinophils Relative 0  0 - 5 %   Basophils Relative 1  0 - 1 %   Neutro Abs 13.4 (*) 1.7 - 7.7 K/uL   Lymphs Abs 1.5  0.7 - 4.0 K/uL   Monocytes Absolute 1.5 (*) 0.1 - 1.0 K/uL   Eosinophils Absolute 0.0  0.0 - 0.7 K/uL   Basophils Absolute 0.2 (*) 0.0 - 0.1 K/uL   RBC Morphology POLYCHROMASIA PRESENT     Comment: RARE NRBCs  COMPREHENSIVE METABOLIC PANEL     Status: Abnormal   Collection Time    05/09/13 11:22 AM      Result Value Range   Sodium 131 (*) 135 - 145 mEq/L   Comment: REPEATED TO VERIFY   Potassium 5.6 (*) 3.5 - 5.1 mEq/L   Comment: SLIGHT HEMOLYSIS     HEMOLYSIS AT THIS LEVEL MAY AFFECT RESULT     ICTERIC SPECIMEN   Chloride 94 (*) 96 - 112 mEq/L   Comment: REPEATED TO VERIFY   CO2 12 (*) 19 - 32 mEq/L   Comment: REPEATED TO VERIFY   Glucose, Bld 128 (*) 70 - 99 mg/dL   BUN 85 (*) 6 - 23 mg/dL   Creatinine, Ser 7.03 (*) 0.50 - 1.35 mg/dL   Calcium 9.3  8.4 - 10.5 mg/dL   Total Protein 8.2  6.0 - 8.3 g/dL   Albumin 3.9  3.5 - 5.2 g/dL   AST 259 (*) 0 - 37 U/L   Comment: SLIGHT HEMOLYSIS     HEMOLYSIS AT THIS LEVEL MAY AFFECT RESULT     ICTERIC SPECIMEN   ALT 470 (*) 0 - 53 U/L   Comment: SLIGHT HEMOLYSIS     HEMOLYSIS AT THIS LEVEL MAY AFFECT RESULT     ICTERIC SPECIMEN   Alkaline Phosphatase 205 (*) 39 - 117 U/L   Total Bilirubin 2.6 (*) 0.3 - 1.2 mg/dL   GFR calc non Af Amer 9 (*) >90 mL/min   GFR calc Af Amer 11 (*) >90 mL/min   Comment: (NOTE)     The eGFR has been calculated using the CKD EPI equation.     This calculation has not been validated in all clinical situations.     eGFR's persistently <90 mL/min signify possible Chronic Kidney     Disease.  TROPONIN I     Status: None   Collection Time    05/09/13 11:22 AM      Result Value Range   Troponin I <0.30  <0.30 ng/mL   Comment:            Due to the release kinetics of cTnI,     a negative result within the  first hours     of the onset of symptoms does not rule out     myocardial  infarction with certainty.     If myocardial infarction is still suspected,     repeat the test at appropriate intervals.  PRO B NATRIURETIC PEPTIDE     Status: Abnormal   Collection Time    05/09/13 11:22 AM      Result Value Range   Pro B Natriuretic peptide (BNP) >70000.0 (*) 0 - 125 pg/mL    Dg Chest Port 1 View  05/09/2013   CLINICAL DATA:  Left upper chest pain. Shortness of breath. Cough and nausea.  EXAM: PORTABLE CHEST - 1 VIEW  COMPARISON:  Two-view chest 04/11/2013.  FINDINGS: The heart is enlarged. Mild pulmonary vascular congestion is evident. No focal airspace consolidation is present. The visualized soft tissues and bony thorax are unremarkable.  IMPRESSION: 1. Stable cardiomegaly with slight progression of pulmonary vascular congestion suggesting early congestive heart failure. 2. No focal airspace consolidation.   Electronically Signed   By: Lawrence Santiago M.D.   On: 05/09/2013 11:51    Review of Systems  Constitutional: Positive for malaise/fatigue. Negative for fever, chills, weight loss and diaphoresis.  HENT: Negative for congestion, ear discharge, ear pain, hearing loss, nosebleeds, sore throat and tinnitus.   Eyes: Negative.   Respiratory: Negative.  Negative for stridor.   Cardiovascular: Negative.   Gastrointestinal: Negative.   Genitourinary: Negative.   Musculoskeletal: Negative.   Neurological: Positive for weakness and headaches. Negative for dizziness, tingling, tremors, sensory change, speech change and focal weakness.  Endo/Heme/Allergies: Negative.   Psychiatric/Behavioral: Negative for depression and substance abuse.  All other systems reviewed and are negative.   Blood pressure 187/94, pulse 92, temperature 97.6 F (36.4 C), temperature source Oral, resp. rate 13, height 6' (1.829 m), weight 121.6 kg (268 lb 1.3 oz), SpO2 100.00%. Physical Exam  Nursing note and vitals reviewed. Constitutional: He is oriented to person, place, and time. He appears  well-developed. No distress.  Obese young [place] American man who appears uncomfortable  HENT:  Head: Normocephalic and atraumatic.  Nose: Nose normal.  Mouth/Throat: No oropharyngeal exudate.  Dry oral mucosa  Eyes: EOM are normal. Pupils are equal, round, and reactive to light. No scleral icterus.  Neck: Normal range of motion. Neck supple. No JVD present. No tracheal deviation present. No thyromegaly present.  Cardiovascular: Normal rate and regular rhythm.  Exam reveals no friction rub.   Murmur heard. ESM over apex  Respiratory: Effort normal and breath sounds normal. No respiratory distress. He has no wheezes. He has no rales.  GI: Soft. Bowel sounds are normal. He exhibits no distension. There is no tenderness. There is no rebound.  Musculoskeletal: Normal range of motion. He exhibits edema.  2+ pitting edema bipedally  Lymphadenopathy:    He has no cervical adenopathy.  Neurological: He is alert and oriented to person, place, and time. No cranial nerve deficit.  Skin: Skin is warm and dry. No rash noted. No erythema.  Psychiatric: He has a normal mood and affect. His behavior is normal.    Assessment/Plan: 1. Chronic kidney disease stage V with progression to ESRD: The patient has mild hyperkalemia (5.6) with anion gap metabolic acidosis. Will attempt medical management of these overnight as we await placement of a tunneled hemodialysis catheter tomorrow by vascular surgery, he has a left brachiocephalic fistula that is still immature for cannulation/dialysis use. On his clinical exam-his hypervolemic and without any uremic signs.  We'll make him n.p.o. after midnight for his procedure tomorrow. Hepatitis B serologies to be checked in anticipation for dialysis. 2. Hypertensive urgency: Currently on nicardipine drip-titration parameters per critical care, resume oral medications likely postoperatively and after blood pressure better controlled. 3. Anemia of chronic kidney disease:  Hemoglobin greater than 10, no current indication for ESA therapy. Plan to check iron stores with next labs. (Last iron saturation on 04/14/2013 was 16% with a ferritin of 52) 4. Metabolic bone disease: We'll add on a phosphorus level to previously drawn labs. Decision on binders based on this. 5. Elevated transaminases: Possibly congestive hepatopathy with a history of CHF currently exacerbated by hypertensive urgency.  Jon Kasparek K. 05/09/2013, 5:51 PM

## 2013-05-09 NOTE — H&P (Signed)
PULMONARY  / CRITICAL CARE MEDICINE  Name: Joshua [name] MRN: [id number] DOB: 16-Apr-1981    ADMISSION DATE:  05/09/2013 CONSULTATION DATE:  05/09/2013  REFERRING MD :  EDP PRIMARY SERVICE:  PCCM  CHIEF COMPLAINT:  Weakness  BRIEF PATIENT DESCRIPTION: 32 yo with past medical history of ESRD (scheduled to start HD), HTN, CHF (Ef 30%) brought to [facility] ED with weakness and headache. He also reported dyspnea, orthopnea and non productive cough. Noted to be hypertensive.  Transferred to [facility] in anticipation of needing HD.  SIGNIFICANT EVENTS / STUDIES:  12/15  Admitted with hypertensive emergency  LINES / TUBES:  CULTURES:  ANTIBIOTICS:  The patient is encephalopathic and unable to provide history, which was obtained for available medical records.  HISTORY OF PRESENT ILLNESS:  32 yo with past medical history of ESRD (scheduled to start HD), HTN, CHF (Ef 30%) brought to [facility] ED with weakness and headache. He also reported dyspnea, orthopnea and non productive cough. Noted to be hypertensive.  Transferred to [facility] in anticipation of needing HD.  PAST MEDICAL HISTORY :  Past Medical History  Diagnosis Date  . Arthritis   . CKD (chronic kidney disease) stage 4, GFR 15-29 ml/min   . Hypertension     Hypertensive urgency/emergency - no RAS, normal aldo/PRA ratio,high urine metanephrines and normetanephrines   . Atrial fibrillation   . Type II diabetes mellitus   . History of bronchitis   . Sleep apnea     No CPAP  . Headache(784.0)     Regular  . Anemia of chronic disease    Past Surgical History  Procedure Laterality Date  . No past surgeries    . Av fistula placement Left 04/18/2013    Procedure: ARTERIOVENOUS (AV) FISTULA CREATION;  Surgeon: Elam Dutch, MD;  Location: [facility] OR;  Service: Vascular;  Laterality: Left;   Prior to Admission medications   Medication Sig Start Date End Date Taking? Authorizing Provider  acetaminophen (TYLENOL) 500 MG tablet Take 1,000 mg by mouth every 6  (six) hours as needed. For pain   Yes Historical Provider, MD  aspirin 325 MG tablet Take 325 mg by mouth daily.   Yes Historical Provider, MD  cloNIDine (CATAPRES) 0.2 MG tablet Take 1 tablet (0.2 mg total) by mouth 3 (three) times daily. 04/19/13  Yes Bonnielee Haff, MD  furosemide (LASIX) 80 MG tablet Take 2 tablets (160 mg total) by mouth 2 (two) times daily. 04/19/13  Yes Bonnielee Haff, MD  hydrALAZINE (APRESOLINE) 100 MG tablet Take 1 tablet (100 mg total) by mouth 3 (three) times daily. 04/19/13  Yes Bonnielee Haff, MD  lisinopril (PRINIVIL,ZESTRIL) 20 MG tablet Take 2 tablets (40 mg total) by mouth 2 (two) times daily. 04/19/13  Yes Bonnielee Haff, MD  metoprolol (LOPRESSOR) 100 MG tablet Take 1 tablet (100 mg total) by mouth daily. 04/19/13  Yes Bonnielee Haff, MD  minoxidil (LONITEN) 2.5 MG tablet Take 2 tablets (5 mg total) by mouth 2 (two) times daily. 04/19/13  Yes Bonnielee Haff, MD  Multiple Vitamin (MULTIVITAMIN WITH MINERALS) TABS tablet Take 1 tablet by mouth daily.   Yes Historical Provider, MD  ondansetron (ZOFRAN) 4 MG tablet Take 4 mg by mouth every 8 (eight) hours as needed for nausea or vomiting.   Yes Historical Provider, MD  sodium bicarbonate 650 MG tablet Take 1 tablet (650 mg total) by mouth 2 (two) times daily. 04/19/13  Yes Bonnielee Haff, MD   Allergies  Allergen Reactions  . Isosorbide Dinitrate  Severe headaches with po nitrates (imdur, isordil), do not prescribe  . Nitrates, Organic     Severe headaches with po nitrates (imdur, isordil, etc), do not prescribe   FAMILY HISTORY:  Family History  Problem Relation Age of Onset  . Diabetes Father   . Hypertension Father   . Hypertension Maternal Grandmother   . Hypertension Maternal Grandfather   . Kidney disease Maternal Grandfather   . Diabetes Maternal Grandfather    SOCIAL HISTORY:  reports that he quit smoking about 13 years ago. His smoking use included Cigarettes. He smoked 0.00 packs per day for  .1 years. He has never used smokeless tobacco. He reports that he does not drink alcohol or use illicit drugs.  REVIEW OF SYSTEMS:  Unable to provide.  INTERVAL HISTORY:  VITAL SIGNS: Temp:  [99 F (37.2 C)] 99 F (37.2 C) (12/15 1106) Pulse Rate:  [103-112] 103 (12/15 1443) Resp:  [13-33] 33 (12/15 1443) BP: (194-264)/(112-177) 227/146 mmHg (12/15 1443) SpO2:  [96 %-100 %] 96 % (12/15 1443)  HEMODYNAMICS:   VENTILATOR SETTINGS:   INTAKE / OUTPUT: Intake/Output   None    PHYSICAL EXAMINATION: General:  Morbidly obese, in no respiratory distress Neuro: Awake, alert, cooperative HEENT:  PERRL, moist membranes Cardiovascular:  RRR, no m/r/g, LUE AV fistula Lungs:  Bilateral diminished air entry, no added sounds Abdomen:  Obese, soft, bowel sounds present Musculoskeletal:  Moves all extremities, bilateral symmetrical pitting edema LE. Skin:  Intact  LABS: CBC  Recent Labs Lab 05/09/13 1122  WBC 16.6*  HGB 11.0*  HCT 32.7*  PLT 307   Coag's No results found for this basename: APTT, INR,  in the last 168 hours BMET  Recent Labs Lab 05/09/13 1122  NA 131*  K 5.6*  CL 94*  CO2 12*  BUN 85*  CREATININE 7.03*  GLUCOSE 128*   Electrolytes  Recent Labs Lab 05/09/13 1122  CALCIUM 9.3   Sepsis Markers No results found for this basename: LATICACIDVEN, PROCALCITON, O2SATVEN,  in the last 168 hours ABG No results found for this basename: PHART, PCO2ART, PO2ART,  in the last 168 hours Liver Enzymes  Recent Labs Lab 05/09/13 1122  AST 259*  ALT 470*  ALKPHOS 205*  BILITOT 2.6*  ALBUMIN 3.9   Cardiac Enzymes  Recent Labs Lab 05/09/13 1122  TROPONINI <0.30  PROBNP >70000.0*   Glucose  Recent Labs Lab 05/09/13 1100  GLUCAP 132*   CXR:  12/15 >>> Pulmonary vascular congestion, no overt airspace disease  ASSESSMENT / PLAN:  PULMONARY A:  Acute pulmonary edema in setting of CHF and ESRD.  Suspect OSA / OHS. P:   Goal SpO2>92 Supplemental  oxygen PRN May need BiPAP  CARDIOVASCULAR A: Acute on chronic systolic / diastolic congestive heart failure.  Hypertensive emergency. P:  Goal BP<180/90 Cardene gtt ASA When stable, restart Clonidine, Hydralazine, Lisinopril, Metoprolol, Minoxidil  RENAL A:  ESRD. Hyponatremia.  Hyperkalemia. Metabolic acidosis. P:   Trend BMP Renal consulted by EDP.  Likely HD today  GASTROINTESTINAL A:  Elevated transaminases. Congestive hepatopathy? H/o cholelithiasis. P:   Renal diet GI Px is not indicated Tylenol level Acute hepatitis panel Trend LFT RUQ [place]  HEMATOLOGIC A:  Mild anemia, suspect chronic of renal disease. P:  Trend CBC Heparin for DVT Px  INFECTIOUS A:  No overt infection.  Leucocytosis? P:   Monitor off abx  ENDOCRINE  A:  DM by history, but not on treatment.  P:   SSI  NEUROLOGIC A:  No active issues. P:   No intervention required  I have personally obtained history, examined patient, evaluated and interpreted laboratory and imaging results, reviewed medical records, formulated assessment / plan and placed orders.  CRITICAL CARE:  The patient is critically ill with multiple organ systems failure and requires high complexity decision making for assessment and support, frequent evaluation and titration of therapies, application of advanced monitoring technologies and extensive interpretation of multiple databases. Critical Care Time devoted to patient care services described in this note is 45 minutes.   Doree Fudge, MD Pulmonary and [place] Pager: [phone]  05/09/2013, 4:14 PM

## 2013-05-09 NOTE — Progress Notes (Signed)
[facility] provided pt with a [employer] application and a list of primary care resources.

## 2013-05-09 NOTE — ED Notes (Signed)
Bed: WA21 Expected date: 05/09/13 Expected time: 10:42 AM Means of arrival:  Comments: EMS  [place]

## 2013-05-09 NOTE — ED Provider Notes (Addendum)
CSN: [id number]     Arrival date & time 05/09/13  1051 History   First MD Initiated Contact with Patient 05/09/13 1059     No chief complaint on file.  (Consider location/radiation/quality/duration/timing/severity/associated sxs/prior Treatment) Patient is a 32 y.o. male presenting with hypertension. The history is provided by the patient.  Hypertension   patient complains of whole-body weakness with mild headache x3 days. Patient has a history of chronic renal failure and is scheduled to start dialysis within the next one to 2 months. Notes increasing coughing congestion worse with exertion and lying flat. Denies any anginal type chest pain. No syncope or near-syncope. Has had nausea without vomiting. Headache classified as bitemporal and worse with standing. Denies any photophobia or neck pain. Treated by myself a month ago for hypertensive crisis and this is similar course the patient. He notes compliance with his high blood pressure meds  Past Medical History  Diagnosis Date  . Arthritis   . CKD (chronic kidney disease) stage 4, GFR 15-29 ml/min   . Hypertension     Hypertensive urgency/emergency - no RAS, normal aldo/PRA ratio,high urine metanephrines and normetanephrines   . Atrial fibrillation   . Type II diabetes mellitus   . History of bronchitis   . Sleep apnea     No CPAP  . Headache(784.0)     Regular  . Anemia of chronic disease    Past Surgical History  Procedure Laterality Date  . No past surgeries    . Av fistula placement Left 04/18/2013    Procedure: ARTERIOVENOUS (AV) FISTULA CREATION;  Surgeon: Elam Dutch, MD;  Location: [facility] OR;  Service: Vascular;  Laterality: Left;   Family History  Problem Relation Age of Onset  . Diabetes Father   . Hypertension Father   . Hypertension Maternal Grandmother   . Hypertension Maternal Grandfather   . Kidney disease Maternal Grandfather   . Diabetes Maternal Grandfather    History  Substance Use Topics  . Smoking  status: Former Smoker -- .1 years    Types: Cigarettes    Quit date: 09/24/1999  . Smokeless tobacco: Never Used  . Alcohol Use: No    Review of Systems  All other systems reviewed and are negative.    Allergies  Isosorbide dinitrate and Nitrates, organic  Home Medications   Current Outpatient Rx  Name  Route  Sig  Dispense  Refill  . acetaminophen (TYLENOL) 500 MG tablet   Oral   Take 1,000 mg by mouth every 6 (six) hours as needed. For pain         . amLODipine (NORVASC) 10 MG tablet   Oral   Take 1 tablet (10 mg total) by mouth daily.   30 tablet   2   . aspirin 325 MG tablet   Oral   Take 325 mg by mouth daily.         . cloNIDine (CATAPRES) 0.2 MG tablet   Oral   Take 1 tablet (0.2 mg total) by mouth 3 (three) times daily.   90 tablet   0   . ferrous sulfate 325 (65 FE) MG tablet   Oral   Take 325 mg by mouth 2 (two) times daily.         . furosemide (LASIX) 80 MG tablet   Oral   Take 2 tablets (160 mg total) by mouth 2 (two) times daily.   60 tablet   0   . hydrALAZINE (APRESOLINE) 100 MG tablet  Oral   Take 1 tablet (100 mg total) by mouth 3 (three) times daily.   90 tablet   1   . lisinopril (PRINIVIL,ZESTRIL) 20 MG tablet   Oral   Take 2 tablets (40 mg total) by mouth 2 (two) times daily.   60 tablet   0   . metoprolol (LOPRESSOR) 100 MG tablet   Oral   Take 1 tablet (100 mg total) by mouth daily.   60 tablet   0   . minoxidil (LONITEN) 2.5 MG tablet   Oral   Take 2 tablets (5 mg total) by mouth 2 (two) times daily.   60 tablet   0   . ondansetron (ZOFRAN) 4 MG tablet   Oral   Take 4 mg by mouth every 8 (eight) hours as needed for nausea or vomiting.         . sodium bicarbonate 650 MG tablet   Oral   Take 1 tablet (650 mg total) by mouth 2 (two) times daily.   60 tablet   0   . traMADol (ULTRAM) 50 MG tablet   Oral   Take 1 tablet (50 mg total) by mouth every 6 (six) hours as needed for moderate pain. For pain    30 tablet   0    BP 262/177  Pulse 112  Temp(Src) 99 F (37.2 C) (Oral)  Resp 18  SpO2 100% Physical Exam  Nursing note and vitals reviewed. Constitutional: He is oriented to person, place, and time. He appears well-developed and well-nourished.  Non-toxic appearance. No distress.  HENT:  Head: Normocephalic and atraumatic.  Eyes: Conjunctivae, EOM and lids are normal. Pupils are equal, round, and reactive to light.  Neck: Normal range of motion. Neck supple. No tracheal deviation present. No mass present.  Cardiovascular: Normal rate, regular rhythm and normal heart sounds.  Exam reveals no gallop.   No murmur heard. Pulmonary/Chest: Effort normal. No stridor. No respiratory distress. He has decreased breath sounds. He has no wheezes. He has rhonchi. He has no rales.  Abdominal: Soft. Normal appearance and bowel sounds are normal. He exhibits no distension. There is no tenderness. There is no rebound and no CVA tenderness.  Musculoskeletal: Normal range of motion. He exhibits no edema and no tenderness.  2+ bilateral lower extremity pitting  Neurological: He is alert and oriented to person, place, and time. He has normal strength. No cranial nerve deficit or sensory deficit. GCS eye subscore is 4. GCS verbal subscore is 5. GCS motor subscore is 6.  Skin: Skin is warm and dry. No abrasion and no rash noted.  Multiple areas of severe dry skin located on his body  Psychiatric: He has a normal mood and affect. His speech is normal and behavior is normal.    ED Course  Procedures (including critical care time) Labs Review Labs Reviewed  GLUCOSE, CAPILLARY - Abnormal; Notable for the following:    Glucose-Capillary 132 (*)    All other components within normal limits  CBC WITH DIFFERENTIAL  COMPREHENSIVE METABOLIC PANEL  TROPONIN I  PRO B NATRIURETIC PEPTIDE   Imaging Review No results found.  EKG Interpretation    Date/Time:  Monday May 09 2013 11:30:15 EST Ventricular  Rate:  107 PR Interval:  163 QRS Duration: 101 QT Interval:  388 QTC Calculation: 518 R Axis:   33 Text Interpretation:  Sinus tachycardia Prolonged QT interval Baseline wander in lead(s) V1 V2 No significant change since last tracing Confirmed by Zenia Resides  MD,  Dayvon Dax ([id number]) on 05/09/2013 1:28:30 PM            MDM  No diagnosis found.  Patient started on Cardene drip for his hypertensive crisis. Patient be checked and adjustments made. Chest x-ray consistent with heart failure. I've consulted the triad hospitalist for admission and patient will be sent to [facility] for admission. I will also speak with the nephrologist on call to arrange for emergent dialysis. Patient has been informed of the plan of care.  2:17 PM Spoke with critical care ( Dr. Earnest Conroy) and they're requesting that the patient's Cardene drip be stopped and that he be given iv labetalol prn and admitted to the medicine service. Patient rechecked and blood pressure is 190/112.  2:26 PM I have re contacted the hospitalist, Dr. Vista Deck and she has requested that the patient be placed on the critical care service due 2 the elevated blood pressure and the need for IV titrated antihypertensive management   CRITICAL CARE Performed by: Leota Jacobsen Total critical care time: 65 Critical care time was exclusive of separately billable procedures and treating other patients. Critical care was necessary to treat or prevent imminent or life-threatening deterioration. Critical care was time spent personally by me on the following activities: development of treatment plan with patient and/or surrogate as well as nursing, discussions with consultants, evaluation of patient's response to treatment, examination of patient, obtaining history from patient or surrogate, ordering and performing treatments and interventions, ordering and review of laboratory studies, ordering and review of radiographic studies, pulse oximetry and  re-evaluation of patient's condition.   Leota Jacobsen, MD 05/09/13 [place] Genevra Orne, MD 05/09/13 1418  Leota Jacobsen, MD 05/18/13 [phone]

## 2013-05-09 NOTE — ED Notes (Signed)
Per EMS-pt c/o of headache, nausea. Denies vomiting. Fistula placed last month, hasn't started dialysis yet. Denies pain.

## 2013-05-10 ENCOUNTER — Encounter (HOSPITAL_COMMUNITY): Payer: Medicaid Other | Admitting: Anesthesiology

## 2013-05-10 ENCOUNTER — Inpatient Hospital Stay (HOSPITAL_COMMUNITY): Payer: Medicaid Other

## 2013-05-10 ENCOUNTER — Encounter (HOSPITAL_COMMUNITY)
Admission: EM | Disposition: A | Discharge status: Home / Self Care | Payer: Self-pay | Source: Home / Self Care | Attending: Internal Medicine

## 2013-05-10 ENCOUNTER — Inpatient Hospital Stay (HOSPITAL_COMMUNITY): Payer: Medicaid Other | Admitting: Anesthesiology

## 2013-05-10 DIAGNOSIS — N184 Chronic kidney disease, stage 4 (severe): Secondary | ICD-10-CM

## 2013-05-10 DIAGNOSIS — N179 Acute kidney failure, unspecified: Secondary | ICD-10-CM

## 2013-05-10 DIAGNOSIS — R079 Chest pain, unspecified: Secondary | ICD-10-CM

## 2013-05-10 HISTORY — PX: INSERTION OF DIALYSIS CATHETER: SHX1324

## 2013-05-10 LAB — CBC
HCT: 30.4 % — ABNORMAL LOW (ref 39.0–52.0)
Hemoglobin: 10.3 g/dL — ABNORMAL LOW (ref 13.0–17.0)
MCH: 24.1 pg — ABNORMAL LOW (ref 26.0–34.0)
MCV: 71 fL — ABNORMAL LOW (ref 78.0–100.0)
RBC: 4.28 MIL/uL (ref 4.22–5.81)
WBC: 14.8 10*3/uL — ABNORMAL HIGH (ref 4.0–10.5)

## 2013-05-10 LAB — TYPE AND SCREEN
ABO/RH(D): B POS
Antibody Screen: NEGATIVE

## 2013-05-10 LAB — IRON AND TIBC
Iron: 24 ug/dL — ABNORMAL LOW (ref 42–135)
Saturation Ratios: 9 % — ABNORMAL LOW (ref 20–55)
TIBC: 279 ug/dL (ref 215–435)
UIBC: 255 ug/dL (ref 125–400)

## 2013-05-10 LAB — BASIC METABOLIC PANEL
Chloride: 100 mEq/L (ref 96–112)
GFR calc Af Amer: 12 mL/min — ABNORMAL LOW (ref >90)
GFR calc non Af Amer: 10 mL/min — ABNORMAL LOW (ref >90)
Potassium: 4.5 mEq/L (ref 3.5–5.1)
Sodium: 135 mEq/L (ref 135–145)

## 2013-05-10 LAB — GLUCOSE, CAPILLARY
Glucose-Capillary: 135 mg/dL — ABNORMAL HIGH (ref 70–99)
Glucose-Capillary: 143 mg/dL — ABNORMAL HIGH (ref 70–99)
Glucose-Capillary: 137 mg/dL — ABNORMAL HIGH (ref 70–99)
Glucose-Capillary: 119 mg/dL — ABNORMAL HIGH (ref 70–99)

## 2013-05-10 LAB — PHOSPHORUS: Phosphorus: 5 mg/dL — ABNORMAL HIGH (ref 2.3–4.6)

## 2013-05-10 LAB — ABO/RH: ABO/RH(D): B POS

## 2013-05-10 LAB — HEPATITIS PANEL, ACUTE
Hep A IgM: NONREACTIVE
Hepatitis B Surface Ag: NEGATIVE

## 2013-05-10 LAB — FERRITIN: Ferritin: 154 ng/mL (ref 22–322)

## 2013-05-10 SURGERY — INSERTION OF DIALYSIS CATHETER
Anesthesia: Monitor Anesthesia Care | Site: Neck

## 2013-05-10 MED ORDER — 0.9 % SODIUM CHLORIDE (POUR BTL) OPTIME
TOPICAL | Status: DC | PRN
  Administered 2013-05-10: 1000 mL

## 2013-05-10 MED ORDER — HEPARIN SODIUM (PORCINE) 1000 UNIT/ML IJ SOLN
INTRAMUSCULAR | Status: DC | PRN
  Administered 2013-05-10: 5 mL via INTRAVENOUS

## 2013-05-10 MED ORDER — FENTANYL CITRATE 0.05 MG/ML IJ SOLN
INTRAMUSCULAR | Status: AC
  Filled 2013-05-10: qty 2

## 2013-05-10 MED ORDER — SODIUM CHLORIDE 0.9 % IR SOLN
Status: DC | PRN
  Administered 2013-05-10: 15:00:00

## 2013-05-10 MED ORDER — OXYCODONE HCL 5 MG/5ML PO SOLN: 5.0000 mg | Freq: Once | ORAL | Status: DC | PRN

## 2013-05-10 MED ORDER — PROPOFOL 10 MG/ML IV BOLUS
INTRAVENOUS | Status: DC | PRN
  Administered 2013-05-10: 200 mg via INTRAVENOUS

## 2013-05-10 MED ORDER — FENTANYL CITRATE 0.05 MG/ML IJ SOLN
25.0000 ug | INTRAMUSCULAR | Status: DC | PRN
  Administered 2013-05-10: 25 ug via INTRAVENOUS

## 2013-05-10 MED ORDER — SUCCINYLCHOLINE CHLORIDE 20 MG/ML IJ SOLN
INTRAMUSCULAR | Status: DC | PRN
  Administered 2013-05-10: 100 mg via INTRAVENOUS

## 2013-05-10 MED ORDER — HYDROMORPHONE HCL PF 1 MG/ML IJ SOLN
INTRAMUSCULAR | Status: AC
  Filled 2013-05-10: qty 1

## 2013-05-10 MED ORDER — CLONIDINE HCL 0.2 MG PO TABS
0.2000 mg | ORAL_TABLET | Freq: Three times a day (TID) | ORAL | Status: DC
  Administered 2013-05-10 – 2013-05-17 (×20): 0.2 mg via ORAL
  Filled 2013-05-10 (×25): qty 1

## 2013-05-10 MED ORDER — OXYCODONE HCL 5 MG PO TABS: 5.0000 mg | ORAL_TABLET | Freq: Once | ORAL | Status: DC | PRN

## 2013-05-10 MED ORDER — PROMETHAZINE HCL 25 MG/ML IJ SOLN: 6.2500 mg | INTRAMUSCULAR | Status: DC | PRN

## 2013-05-10 MED ORDER — HEPARIN SODIUM (PORCINE) 1000 UNIT/ML IJ SOLN
INTRAMUSCULAR | Status: AC
  Filled 2013-05-10: qty 1

## 2013-05-10 MED ORDER — SODIUM CHLORIDE 0.9 % IV SOLN
INTRAVENOUS | Status: DC | PRN
  Administered 2013-05-10: 15:00:00 via INTRAVENOUS

## 2013-05-10 MED ORDER — MEPERIDINE HCL 25 MG/ML IJ SOLN: 6.2500 mg | INTRAMUSCULAR | Status: DC | PRN

## 2013-05-10 MED ORDER — DEXTROSE 5 % IV SOLN
0.5000 mg/min | INTRAVENOUS | Status: DC
  Administered 2013-05-10: 0.5 mg/min via INTRAVENOUS
  Filled 2013-05-10 (×4): qty 100

## 2013-05-10 MED ORDER — LIDOCAINE HCL (CARDIAC) 20 MG/ML IV SOLN
INTRAVENOUS | Status: DC | PRN
  Administered 2013-05-10: 100 mg via INTRAVENOUS

## 2013-05-10 MED ORDER — HYDROMORPHONE HCL PF 1 MG/ML IJ SOLN: 0.2500 mg | INTRAMUSCULAR | Status: DC | PRN

## 2013-05-10 MED ORDER — LIDOCAINE HCL (PF) 1 % IJ SOLN
INTRAMUSCULAR | Status: AC
  Filled 2013-05-10: qty 30

## 2013-05-10 SURGICAL SUPPLY — 42 items
BAG DECANTER FOR FLEXI CONT (MISCELLANEOUS) ×2 IMPLANT
CATH CANNON HEMO 15F 50CM (CATHETERS) IMPLANT
CATH CANNON HEMO 15FR 19 (HEMODIALYSIS SUPPLIES) IMPLANT
CATH CANNON HEMO 15FR 23CM (HEMODIALYSIS SUPPLIES) IMPLANT
CATH CANNON HEMO 15FR 31CM (HEMODIALYSIS SUPPLIES) IMPLANT
CATH CANNON HEMO 15FR 32CM (HEMODIALYSIS SUPPLIES) ×2 IMPLANT
CATH STRAIGHT 5FR 65CM (CATHETERS) IMPLANT
CHLORAPREP W/TINT 26ML (MISCELLANEOUS) IMPLANT
COVER PROBE W GEL 5X96 (DRAPES) IMPLANT
COVER SURGICAL LIGHT HANDLE (MISCELLANEOUS) ×2 IMPLANT
DECANTER SPIKE VIAL GLASS SM (MISCELLANEOUS) ×2 IMPLANT
DERMABOND ADVANCED (GAUZE/BANDAGES/DRESSINGS) ×1
DERMABOND ADVANCED .7 DNX12 (GAUZE/BANDAGES/DRESSINGS) ×1 IMPLANT
DRAPE C-ARM 42X72 X-RAY (DRAPES) ×2 IMPLANT
DRAPE CHEST BREAST 15X10 FENES (DRAPES) ×2 IMPLANT
DRSG OPSITE POSTOP 3X4 (GAUZE/BANDAGES/DRESSINGS) ×2 IMPLANT
GAUZE SPONGE 2X2 8PLY STRL LF (GAUZE/BANDAGES/DRESSINGS) ×1 IMPLANT
GAUZE SPONGE 4X4 16PLY XRAY LF (GAUZE/BANDAGES/DRESSINGS) ×2 IMPLANT
GLOVE BIO SURGEON STRL SZ7.5 (GLOVE) ×2 IMPLANT
GOWN PREVENTION PLUS XLARGE (GOWN DISPOSABLE) ×2 IMPLANT
GOWN STRL NON-REIN LRG LVL3 (GOWN DISPOSABLE) ×6 IMPLANT
KIT BASIN OR (CUSTOM PROCEDURE TRAY) ×2 IMPLANT
KIT ROOM TURNOVER OR (KITS) ×2 IMPLANT
NEEDLE 18GX1X1/2 (RX/OR ONLY) (NEEDLE) ×2 IMPLANT
NEEDLE 22X1 1/2 (OR ONLY) (NEEDLE) ×2 IMPLANT
NEEDLE HYPO 25GX1X1/2 BEV (NEEDLE) ×2 IMPLANT
NS IRRIG 1000ML POUR BTL (IV SOLUTION) ×2 IMPLANT
PACK SURGICAL SETUP 50X90 (CUSTOM PROCEDURE TRAY) ×2 IMPLANT
PAD ARMBOARD 7.5X6 YLW CONV (MISCELLANEOUS) ×4 IMPLANT
SET MICROPUNCTURE 5F STIFF (MISCELLANEOUS) IMPLANT
SPONGE GAUZE 2X2 STER 10/PKG (GAUZE/BANDAGES/DRESSINGS) ×1
SUT ETHILON 3 0 PS 1 (SUTURE) ×2 IMPLANT
SUT VICRYL 4-0 PS2 18IN ABS (SUTURE) ×2 IMPLANT
SYR 20CC LL (SYRINGE) ×4 IMPLANT
SYR 30ML LL (SYRINGE) IMPLANT
SYR 5ML LL (SYRINGE) ×4 IMPLANT
SYR CONTROL 10ML LL (SYRINGE) ×2 IMPLANT
SYRINGE 10CC LL (SYRINGE) ×2 IMPLANT
TOWEL OR 17X24 6PK STRL BLUE (TOWEL DISPOSABLE) ×2 IMPLANT
TOWEL OR 17X26 10 PK STRL BLUE (TOWEL DISPOSABLE) ×2 IMPLANT
WATER STERILE IRR 1000ML POUR (IV SOLUTION) ×2 IMPLANT
WIRE AMPLATZ SS-J .035X180CM (WIRE) IMPLANT

## 2013-05-10 NOTE — Preoperative (Signed)
Beta Blockers   Reason not to administer Beta Blockers:  Pt came to Holding Area on Labetelol Infusion at 1 mg/min

## 2013-05-10 NOTE — Progress Notes (Signed)
PULMONARY  / CRITICAL CARE MEDICINE  Name: Joshua [name] MRN: [id number] DOB: [birth year]    ADMISSION DATE:  05/09/2013 CONSULTATION DATE:  05/09/2013  REFERRING MD :  EDP PRIMARY SERVICE:  PCCM  CHIEF COMPLAINT:  Weakness  BRIEF PATIENT DESCRIPTION: 32 yo with past medical history of ESRD (scheduled to start HD), HTN, CHF (Ef 30%) brought to [facility] ED with weakness and headache. He also reported dyspnea, orthopnea and non productive cough. Noted to be hypertensive.  Transferred to [facility] in anticipation of needing HD.  SIGNIFICANT EVENTS / STUDIES:  12/15  Admitted with hypertensive emergency  LINES / TUBES:  CULTURES:  ANTIBIOTICS:  INTERVAL HISTORY: HTN remains  VITAL SIGNS: Temp:  [97.6 F (36.4 C)-98.2 F (36.8 C)] 98 F (36.7 C) (12/16 0750) Pulse Rate:  [85-109] 107 (12/16 1100) Resp:  [0-36] 35 (12/16 1100) BP: (171-264)/(81-167) 183/81 mmHg (12/16 1100) SpO2:  [96 %-100 %] 99 % (12/16 1100) Weight:  [121.6 kg (268 lb 1.3 oz)-122.8 kg (270 lb 11.6 oz)] 122.8 kg (270 lb 11.6 oz) (12/16 0500)  HEMODYNAMICS:   VENTILATOR SETTINGS:   INTAKE / OUTPUT: Intake/Output     12/15 0701 - 12/16 0700 12/16 0701 - 12/17 0700   I.V. (mL/kg) 820 (6.7) 501.7 (4.1)   Total Intake(mL/kg) 820 (6.7) 501.7 (4.1)   Urine (mL/kg/hr) 1100 350 (0.7)   Total Output 1100 350   Net -280 +151.7         PHYSICAL EXAMINATION: General:  Morbidly obese, in no respiratory distress Neuro: Awake, alert, cooperative HEENT:  PERRL  Cardiovascular:  RRR, no m/r/g, LUE AV fistula wnl Lungs:  Bilateral reduced Abdomen:  Obese, soft, bowel sounds present Musculoskeletal:  Moves all extremities, bilateral symmetrical pitting edema LE. Skin:  Intact  LABS: CBC  Recent Labs Lab 05/09/13 1122  WBC 16.6*  HGB 11.0*  HCT 32.7*  PLT 307   Coag's No results found for this basename: APTT, INR,  in the last 168 hours BMET  Recent Labs Lab 05/09/13 1122 05/10/13 0845  NA 131* 135  K 5.6* 4.5   CL 94* 100  CO2 12* 15*  BUN 85* 86*  CREATININE 7.03* 6.64*  GLUCOSE 128* 125*   Electrolytes  Recent Labs Lab 05/09/13 1122 05/09/13 2135 05/10/13 0845  CALCIUM 9.3  --  8.2*  PHOS  --  5.3*  --    Sepsis Markers No results found for this basename: LATICACIDVEN, PROCALCITON, O2SATVEN,  in the last 168 hours ABG No results found for this basename: PHART, PCO2ART, PO2ART,  in the last 168 hours Liver Enzymes  Recent Labs Lab 05/09/13 1122 05/09/13 1725  AST 259* 294*  ALT 470* 500*  ALKPHOS 205* 201*  BILITOT 2.6* 2.6*  ALBUMIN 3.9 3.7   Cardiac Enzymes  Recent Labs Lab 05/09/13 1122  TROPONINI <0.30  PROBNP >70000.0*   Glucose  Recent Labs Lab 05/09/13 1100 05/09/13 1617 05/09/13 2028 05/09/13 2347 05/10/13 0415 05/10/13 0749  GLUCAP 132* 139* 152* 167* 128* 135*   CXR:  12/15 >>> Pulmonary vascular congestion, no overt airspace disease  ASSESSMENT / PLAN:  PULMONARY A:  Acute pulmonary edema in setting of CHF and ESRD.  Suspect OSA / OHS. P:   Goal SpO2>92 Supplemental oxygen PRN NO BIPAP required, no distress For hd  CARDIOVASCULAR A: Acute on chronic systolic / diastolic congestive heart failure.  Hypertensive emergency. P:  Goal BP<180/90 Cardene gtt titrated up, failed, wean to off as we add labetolol May need addition clonidine (will  rebound) , need HD , may make most difference ASA When stable, Hydralazine, Lisinopril, Metoprolol, Minoxidil MAP goal 110-120 , 25% reduction  RENAL A:  ESRD. Hyponatremia.  Hyperkalemia. Metabolic acidosis. P:   Trend BMP Renal consulted by EDP.  Likely HD today  GASTROINTESTINAL A:  Elevated transaminases. Congestive hepatopathy? H/o cholelithiasis. P:   Renal diet GI Px is not indicated Tylenol level negative Acute hepatitis panel Trend LFT in am further RUQ [place] awaited, have reviewed past [place] in nov  HEMATOLOGIC A:  Mild anemia, suspect chronic of renal disease. P:  Trend  CBC Heparin for DVT Px  INFECTIOUS A:  No overt infection.  Leucocytosis mild in setting likely hemoconcentration affect  ( all cell line sincrease) P:   Monitor off abx Add HCAP coverage if temp up  ENDOCRINE  A:  DM by history, but not on treatment.  P:   SSI  NEUROLOGIC A: resolved enceph P:   HTN control  I have personally obtained history, examined patient, evaluated and interpreted laboratory and imaging results, reviewed medical records, formulated assessment / plan and placed orders.  CRITICAL CARE:  The patient is critically ill with multiple organ systems failure and requires high complexity decision making for assessment and support, frequent evaluation and titration of therapies, application of advanced monitoring technologies and extensive interpretation of multiple databases. Critical Care Time devoted to patient care services described in this note is 35 minutes.   Raylene Miyamoto., MD Pulmonary and [place] Pager: [phone]  05/10/2013, 11:15 AM

## 2013-05-10 NOTE — Op Note (Signed)
Procedure: Ultrasound-guided insertion of Diatek catheter  Preoperative diagnosis: End-stage renal disease  Postoperative diagnosis: Same  Anesthesia: Local with IV sedation  Operative findings: 27 cm Diatek catheter right internal jugular vein  Operative details: After obtaining informed consent, the patient was taken to the operating room. The patient was placed in supine position on the operating room table. After adequate sedation the patient's entire neck and chest were prepped and draped in usual sterile fashion. The patient was placed in Trendelenburg position. Ultrasound was used to identify the patient's right internal jugular vein. This had normal compressibility and respiratory variation. Local anesthesia was infiltrated over the right jugular vein.  Using ultrasound guidance, the right internal jugular vein was successfully cannulated.  A 0.035 J-tipped guidewire was threaded into the right internal jugular vein and into the superior vena cava followed by the inferior vena cava under fluoroscopic guidance.   Next sequential 12 and 14 dilators were placed over the guidewire into the right atrium.  A 16 French dilator with a peel-away sheath was then placed over the guidewire into the right atrium.   The guidewire and dilator were removed. A 27 cm Diatek catheter was then placed through the peel away sheath into the right atrium.  The catheter was then tunneled subcutaneously, cut to length, and the hub attached. The catheter was noted to flush and draw easily. The catheter was inspected under fluoroscopy and found with its tip to be in the right atrium without any kinks throughout its course. The catheter was sutured to the skin with nylon sutures. The neck insertion site was closed with Vicryl stitch. The catheter was then loaded with concentrated Heparin solution. A dry sterile dressing was applied.  The patient tolerated procedure well and there were no complications. Instrument sponge and  needle counts correct in the case. The patient was taken to the recovery room in stable condition. Chest x-ray will be obtained in the recovery room.  Annamarie Major, MD [facility] [place] Office: [phone] Pager: [phone]

## 2013-05-10 NOTE — H&P (Signed)
Patient name: Joshua [name] MRN: [id number] DOB: 05-Jan-1981 Sex: male     Chief Complaint  Patient presents with  . Nausea  . Hypertension    HISTORY OF PRESENT ILLNESS: The patient has end-stage renal disease and is now in need of dialysis.  He previously has undergone AV fistula creation on 04/18/2013, however this is not ready for use.  Therefore a dialysis catheter has been recommended.  Past Medical History  Diagnosis Date  . Arthritis   . CKD (chronic kidney disease) stage 4, GFR 15-29 ml/min   . Hypertension     Hypertensive urgency/emergency - no RAS, normal aldo/PRA ratio,high urine metanephrines and normetanephrines   . Atrial fibrillation   . Type II diabetes mellitus   . History of bronchitis   . Sleep apnea     No CPAP  . Headache(784.0)     Regular  . Anemia of chronic disease     Past Surgical History  Procedure Laterality Date  . No past surgeries    . Av fistula placement Left 04/18/2013    Procedure: ARTERIOVENOUS (AV) FISTULA CREATION;  Surgeon: Elam Dutch, MD;  Location: [place];  Service: Vascular;  Laterality: Left;    History   Social History  . Marital Status: Single    Spouse Name: N/A    Number of Children: N/A  . Years of Education: N/A   Occupational History  . Not on file.   Social History Main Topics  . Smoking status: Former Smoker -- .1 years    Types: Cigarettes    Quit date: 09/24/1999  . Smokeless tobacco: Never Used  . Alcohol Use: No  . Drug Use: No  . Sexual Activity: Yes   Other Topics Concern  . Not on file   Social History Narrative  . No narrative on file    Family History  Problem Relation Age of Onset  . Diabetes Father   . Hypertension Father   . Hypertension Maternal Grandmother   . Hypertension Maternal Grandfather   . Kidney disease Maternal Grandfather   . Diabetes Maternal Grandfather     Allergies as of 05/09/2013 - Review Complete 05/09/2013  Allergen Reaction Noted  . Isosorbide  dinitrate  04/14/2013  . Nitrates, organic  04/14/2013    No current facility-administered medications on file prior to encounter.   Current Outpatient Prescriptions on File Prior to Encounter  Medication Sig Dispense Refill  . acetaminophen (TYLENOL) 500 MG tablet Take 1,000 mg by mouth every 6 (six) hours as needed. For pain      . aspirin 325 MG tablet Take 325 mg by mouth daily.      . cloNIDine (CATAPRES) 0.2 MG tablet Take 1 tablet (0.2 mg total) by mouth 3 (three) times daily.  90 tablet  0  . furosemide (LASIX) 80 MG tablet Take 2 tablets (160 mg total) by mouth 2 (two) times daily.  60 tablet  0  . hydrALAZINE (APRESOLINE) 100 MG tablet Take 1 tablet (100 mg total) by mouth 3 (three) times daily.  90 tablet  1  . lisinopril (PRINIVIL,ZESTRIL) 20 MG tablet Take 2 tablets (40 mg total) by mouth 2 (two) times daily.  60 tablet  0  . metoprolol (LOPRESSOR) 100 MG tablet Take 1 tablet (100 mg total) by mouth daily.  60 tablet  0  . minoxidil (LONITEN) 2.5 MG tablet Take 2 tablets (5 mg total) by mouth 2 (two) times daily.  60 tablet  0  .  ondansetron (ZOFRAN) 4 MG tablet Take 4 mg by mouth every 8 (eight) hours as needed for nausea or vomiting.      . sodium bicarbonate 650 MG tablet Take 1 tablet (650 mg total) by mouth 2 (two) times daily.  60 tablet  0     REVIEW OF SYSTEMS: Please see admission history and physical  PHYSICAL EXAMINATION:   Vital signs are BP 159/87  Pulse 80  Temp(Src) 97.9 F (36.6 C) (Oral)  Resp 27  Ht 6' (1.829 m)  Wt 270 lb 11.6 oz (122.8 kg)  BMI 36.71 kg/m2  SpO2 99% General: The patient appears their stated age. HEENT:  No gross abnormalities Pulmonary:  Non labored breathing Musculoskeletal: There are no major deformities. Neurologic: No focal weakness or paresthesias are detected, Skin: There are no ulcer or rashes noted. Psychiatric: The patient has normal affect. Cardiovascular: There is a regular rate and rhythm without significant murmur  appreciated.    Assessment: End-stage renal disease Plan: I will place tunneled dialysis catheter.  Risks and benefits were discussed with the patient  V. Leia Alf, M.D. [facility] Office: [phone] Pager:  [phone]

## 2013-05-10 NOTE — Anesthesia Procedure Notes (Signed)
Procedure Name: Intubation Date/Time: 05/10/2013 3:45 PM Performed by: Terrill Mohr Pre-anesthesia Checklist: Patient identified, Emergency Drugs available, Suction available and Patient being monitored Patient Re-evaluated:Patient Re-evaluated prior to inductionOxygen Delivery Method: Circle system utilized Preoxygenation: Pre-oxygenation with 100% oxygen Intubation Type: IV induction Laryngoscope Size: Mac and 3 (of Glidescope...) Grade View: Grade II Number of attempts: 1 Airway Equipment and Method: Video-laryngoscopy and Rigid stylet Placement Confirmation: ETT inserted through vocal cords under direct vision,  breath sounds checked- equal and bilateral and positive ETCO2 Secured at: 22 (cm at teeth) cm Tube secured with: Tape Dental Injury: Teeth and Oropharynx as per pre-operative assessment

## 2013-05-10 NOTE — Progress Notes (Signed)
Patient ID: Joshua [name], male   DOB: Oct 07, 1980, 32 y.o.   MRN: [id number]  [facility] Progress Note    Subjective:   C/o severe HA and Abd pain   Objective:   BP 196/113  Pulse 98  Temp(Src) 98 F (36.7 C) (Oral)  Resp 23  Ht 6' (1.829 m)  Wt 122.8 kg (270 lb 11.6 oz)  BMI 36.71 kg/m2  SpO2 100%  Intake/Output: I/O last 3 completed shifts: In: 770 [I.V.:770] Out: 1100 [Urine:1100]   Intake/Output this shift:    Weight change:   Physical Exam: Gen:WD WN AAM in mild distress [id number], no rub Resp:scattered rhonchi Abd:+BS, soft, NT Ext:LUE AVF +T/B, no edema  Labs: BMET  Recent Labs Lab 05/09/13 1122 05/09/13 1725 05/09/13 2135  NA 131*  --   --   K 5.6*  --   --   CL 94*  --   --   CO2 12*  --   --   GLUCOSE 128*  --   --   BUN 85*  --   --   CREATININE 7.03*  --   --   ALBUMIN 3.9 3.7  --   CALCIUM 9.3  --   --   PHOS  --   --  5.3*   CBC  Recent Labs Lab 05/09/13 1122  WBC 16.6*  NEUTROABS 13.4*  HGB 11.0*  HCT 32.7*  MCV 70.6*  PLT 307    @IMGRELPRIORS @ Medications:    . antiseptic oral rinse  15 mL Mouth Rinse BID  . aspirin  325 mg Oral Daily  . heparin subcutaneous  5,000 Units Subcutaneous Q8H  . insulin aspart  0-15 Units Subcutaneous TID WC  . insulin aspart  0-5 Units Subcutaneous QHS  . pneumococcal 23 valent vaccine  0.5 mL Intramuscular Tomorrow-1000     Assessment/ Plan:   1. Malignant HTN- titrate Cardene gtt for SBP<180 2. New ESRD: secondary to poorly controlled HTN.  Plan for PC placement today by [facility] and proceed with HD today and again tomorrow. 3. Anemia:stable 4. CKD-MBD: follow Ca/Phos/iPTH and initiate vit D therapy dose pending iPTH level 5. Nutrition:follow 6. Hypertension:as above 7. Vascular access: LUE AVF placed last month by Dr. Bridgett Larsson not yet mature for use, will need PC placed today by [facility].   8. HA/Abd pain- treat BP  Joshua [name] A 05/10/2013, 8:40 AM

## 2013-05-10 NOTE — Transfer of Care (Signed)
Immediate Anesthesia Transfer of Care Note  Patient: Joshua [name]  Procedure(s) Performed: Procedure(s): INSERTION OF DIALYSIS CATHETER (N/A)  Patient Location: PACU  Anesthesia Type:General  Level of Consciousness: unresponsive  Airway & Oxygen Therapy: Patient Spontanous Breathing and Patient connected to face mask oxygen  Post-op Assessment: Report given to PACU RN and Post -op Vital signs reviewed and stable  Post vital signs: Reviewed and stable  Complications: No apparent anesthesia complications

## 2013-05-10 NOTE — Progress Notes (Signed)
UR Completed.  [name] [id number] 05/10/2013

## 2013-05-10 NOTE — Anesthesia Preprocedure Evaluation (Addendum)
Anesthesia Evaluation  Patient identified by MRN, date of birth, ID band Patient awake  General Assessment Comment:Pt "feels bad" and is SOB.  Labetelol infusion going at 1 mg/minute  Reviewed: Allergy & Precautions, H&P , NPO status , Patient's Chart, lab work & pertinent test results, reviewed documented beta blocker date and time   Airway Mallampati: II TM Distance: >3 FB Neck ROM: Full    Dental  (+) Dental Advisory Given and Teeth Intact   Pulmonary sleep apnea (does not use CPAP) , former smoker,  Pt denies any breathing problems except short of breath... breath sounds clear to auscultation        Cardiovascular hypertension, Pt. on medications and Pt. on home beta blockers +CHF + dysrhythmias Atrial Fibrillation Rhythm:Regular Rate:Normal  8/15 ECHO: EF 30% with diffuse LV and RV hypokinesis, valves OK   Neuro/Psych  Headaches, negative psych ROS   GI/Hepatic negative GI ROS, Neg liver ROS,   Endo/Other  diabetes (glu 143), Poorly Controlled, Type 2Morbid obesity  Renal/GU ESRFRenal disease (K+ 4.3)     Musculoskeletal   Abdominal   Peds  Hematology  (+) Blood dyscrasia (Hb 10.3), anemia ,   Anesthesia Other Findings Full beard and huge face, thick neck.  ROM good.  Reproductive/Obstetrics                       Anesthesia Physical Anesthesia Plan  ASA: IV  Anesthesia Plan: General and MAC   Post-op Pain Management:    Induction: Intravenous  Airway Management Planned: LMA  Additional Equipment:   Intra-op Plan:   Post-operative Plan: Extubation in OR  Informed Consent: I have reviewed the patients History and Physical, chart, labs and discussed the procedure including the risks, benefits and alternatives for the proposed anesthesia with the patient or authorized representative who has indicated his/her understanding and acceptance.   Dental advisory given  Plan Discussed with:  CRNA  Anesthesia Plan Comments:        Anesthesia Quick Evaluation

## 2013-05-10 NOTE — Anesthesia Postprocedure Evaluation (Signed)
Anesthesia Post Note  Patient: Joshua [name]  Procedure(s) Performed: Procedure(s) (LRB): INSERTION OF DIALYSIS CATHETER (N/A)  Anesthesia type: General  Patient location: PACU  Post pain: Pain level controlled  Post assessment: Post-op Vital signs reviewed  Last Vitals: BP 159/87  Pulse 80  Temp(Src) 36.6 C (Oral)  Resp 27  Ht 6' (1.829 m)  Wt 270 lb 11.6 oz (122.8 kg)  BMI 36.71 kg/m2  SpO2 99%  Post vital signs: Reviewed  Level of consciousness: sedated  Complications: No apparent anesthesia complications. Plan to transfer quickly to MICU for urgent dialysis.

## 2013-05-11 LAB — RENAL FUNCTION PANEL
Albumin: 3 g/dL — ABNORMAL LOW (ref 3.5–5.2)
BUN: 82 mg/dL — ABNORMAL HIGH (ref 6–23)
CO2: 17 mEq/L — ABNORMAL LOW (ref 19–32)
Calcium: 7.9 mg/dL — ABNORMAL LOW (ref 8.4–10.5)
Creatinine, Ser: 6.26 mg/dL — ABNORMAL HIGH (ref 0.50–1.35)
GFR calc Af Amer: 12 mL/min — ABNORMAL LOW (ref >90)
Glucose, Bld: 95 mg/dL (ref 70–99)
Phosphorus: 5.8 mg/dL — ABNORMAL HIGH (ref 2.3–4.6)
Sodium: 138 mEq/L (ref 135–145)

## 2013-05-11 LAB — GLUCOSE, CAPILLARY
Glucose-Capillary: 97 mg/dL (ref 70–99)
Glucose-Capillary: 121 mg/dL — ABNORMAL HIGH (ref 70–99)
Glucose-Capillary: 102 mg/dL — ABNORMAL HIGH (ref 70–99)

## 2013-05-11 LAB — CBC
HCT: 27.4 % — ABNORMAL LOW (ref 39.0–52.0)
Hemoglobin: 9.2 g/dL — ABNORMAL LOW (ref 13.0–17.0)
WBC: 11.5 10*3/uL — ABNORMAL HIGH (ref 4.0–10.5)

## 2013-05-11 MED ORDER — HEPARIN SODIUM (PORCINE) 1000 UNIT/ML DIALYSIS: 3000.0000 [IU] | INTRAMUSCULAR | Status: DC | PRN

## 2013-05-11 MED ORDER — HYDRALAZINE HCL 50 MG PO TABS
100.0000 mg | ORAL_TABLET | Freq: Three times a day (TID) | ORAL | Status: DC
  Administered 2013-05-11 – 2013-05-12 (×4): 100 mg via ORAL
  Filled 2013-05-11 (×6): qty 2

## 2013-05-11 MED ORDER — SODIUM CHLORIDE 0.9 % IV SOLN: 100.0000 mL | INTRAVENOUS | Status: DC | PRN

## 2013-05-11 MED ORDER — NEPRO/CARBSTEADY PO LIQD
237.0000 mL | ORAL | Status: DC | PRN
  Filled 2013-05-11: qty 237

## 2013-05-11 MED ORDER — SODIUM CHLORIDE 0.9 % IV SOLN
1020.0000 mg | Freq: Once | INTRAVENOUS | Status: AC
  Administered 2013-05-11: 1020 mg via INTRAVENOUS
  Filled 2013-05-11: qty 34

## 2013-05-11 MED ORDER — HEPARIN SODIUM (PORCINE) 1000 UNIT/ML DIALYSIS: 20.0000 [IU]/kg | INTRAMUSCULAR | Status: DC | PRN

## 2013-05-11 MED ORDER — LIDOCAINE HCL (PF) 1 % IJ SOLN: 5.0000 mL | INTRAMUSCULAR | Status: DC | PRN

## 2013-05-11 MED ORDER — HEPARIN SODIUM (PORCINE) 1000 UNIT/ML DIALYSIS: 1000.0000 [IU] | INTRAMUSCULAR | Status: DC | PRN

## 2013-05-11 MED ORDER — PENTAFLUOROPROP-TETRAFLUOROETH EX AERO
1.0000 | INHALATION_SPRAY | CUTANEOUS | Status: DC | PRN
Start: 2013-05-11 — End: 2013-05-12

## 2013-05-11 MED ORDER — LIDOCAINE-PRILOCAINE 2.5-2.5 % EX CREA: 1.0000 "application " | TOPICAL_CREAM | CUTANEOUS | Status: DC | PRN

## 2013-05-11 MED ORDER — ALTEPLASE 2 MG IJ SOLR: 2.0000 mg | Freq: Once | INTRAMUSCULAR | Status: AC | PRN

## 2013-05-11 MED ORDER — MINOXIDIL 2.5 MG PO TABS
5.0000 mg | ORAL_TABLET | Freq: Two times a day (BID) | ORAL | Status: DC
  Administered 2013-05-11 – 2013-05-17 (×13): 5 mg via ORAL
  Filled 2013-05-11 (×15): qty 2

## 2013-05-11 NOTE — Progress Notes (Signed)
Report called to receiving nurse [phone]. Past medical history and present hospitalization reported. All questions answered. Patient alert and oriented. All belongings taken to 4E05 with chart and meds. Patient to receive HD in hemodialysis department then transport to new room. Patient aware of new room assignment.

## 2013-05-11 NOTE — Progress Notes (Signed)
PULMONARY  / CRITICAL CARE MEDICINE  Name: Joshua [name] MRN: [id number] DOB: Dec 28, 1980    ADMISSION DATE:  05/09/2013 CONSULTATION DATE:  05/09/2013  REFERRING MD :  EDP PRIMARY SERVICE:  PCCM  CHIEF COMPLAINT:  Weakness  BRIEF PATIENT DESCRIPTION: 32 yo with past medical history of ESRD (scheduled to start HD), HTN, CHF (Ef 30%) brought to [facility] ED with weakness and headache. He also reported dyspnea, orthopnea and non productive cough. Noted to be hypertensive.  Transferred to [facility] in anticipation of needing HD.  SIGNIFICANT EVENTS / STUDIES:  12/15  Admitted with hypertensive emergency 12/16- HD 12/16 abdo us>>Gallstones. No evidence of cholecystitis.  2. Fatty infiltration liver.  3. Increased echogenicity noted both kidneys, this suggests chronic  medical renal disease.   LINES / TUBES:  CULTURES:  ANTIBIOTICS:  INTERVAL HISTORY: HTN remains on labetolol  VITAL SIGNS: Temp:  [97.2 F (36.2 C)-97.9 F (36.6 C)] 97.7 F (36.5 C) (12/17 0830) Pulse Rate:  [71-108] 78 (12/17 0800) Resp:  [12-35] 22 (12/17 0800) BP: (128-190)/(79-112) 190/109 mmHg (12/17 0800) SpO2:  [92 %-100 %] 99 % (12/17 0800) Weight:  [119.9 kg (264 lb 5.3 oz)] 119.9 kg (264 lb 5.3 oz) (12/17 0500)  HEMODYNAMICS:   VENTILATOR SETTINGS:   INTAKE / OUTPUT: Intake/Output     12/16 0701 - 12/17 0700 12/17 0701 - 12/18 0700   P.O. 100    I.V. (mL/kg) 1324.3 (11) 7.5 (0.1)   Total Intake(mL/kg) 1424.3 (11.9) 7.5 (0.1)   Urine (mL/kg/hr) 2050 (0.7) 450 (1)   Other 2056 (0.7)    Blood 100 (0)    Total Output 4206 450   Net -2781.7 -442.5         PHYSICAL EXAMINATION: General:  Morbidly obese, in no respiratory distress Neuro: Awake, alert, cooperative HEENT:  PERRL  Cardiovascular:  RRR, no m/r/g, LUE AV fistula wnl Lungs:  Slight coarse Abdomen:  Obese, soft, bowel sounds present Musculoskeletal:  Moves all extremities, bilateral symmetrical pitting edema LE. Skin:   Intact  LABS: CBC  Recent Labs Lab 05/09/13 1122 05/10/13 1020 05/11/13 0421  WBC 16.6* 14.8* 11.5*  HGB 11.0* 10.3* 9.2*  HCT 32.7* 30.4* 27.4*  PLT 307 214 193   Coag's No results found for this basename: APTT, INR,  in the last 168 hours BMET  Recent Labs Lab 05/09/13 1122 05/10/13 0845 05/11/13 0440  NA 131* 135 138  K 5.6* 4.5 4.2  CL 94* 100 105  CO2 12* 15* 17*  BUN 85* 86* 82*  CREATININE 7.03* 6.64* 6.26*  GLUCOSE 128* 125* 95   Electrolytes  Recent Labs Lab 05/09/13 1122 05/09/13 2135 05/10/13 0845 05/10/13 1020 05/11/13 0440  CALCIUM 9.3  --  8.2*  --  7.9*  PHOS  --  5.3*  --  5.0* 5.8*   Sepsis Markers No results found for this basename: LATICACIDVEN, PROCALCITON, O2SATVEN,  in the last 168 hours ABG No results found for this basename: PHART, PCO2ART, PO2ART,  in the last 168 hours Liver Enzymes  Recent Labs Lab 05/09/13 1122 05/09/13 1725 05/11/13 0440  AST 259* 294*  --   ALT 470* 500*  --   ALKPHOS 205* 201*  --   BILITOT 2.6* 2.6*  --   ALBUMIN 3.9 3.7 3.0*   Cardiac Enzymes  Recent Labs Lab 05/09/13 1122  TROPONINI <0.30  PROBNP >70000.0*   Glucose  Recent Labs Lab 05/10/13 0415 05/10/13 0749 05/10/13 1155 05/10/13 1644 05/10/13 1947 05/11/13 0828  GLUCAP 128* 135*  143* 137* 119* 97   CXR:  12/15 >>> Pulmonary vascular congestion, no overt airspace disease  ASSESSMENT / PLAN:  PULMONARY A:  Acute pulmonary edema in setting of CHF and ESRD.  Suspect OSA / OHS. P:   Goal SpO2>92 Supplemental oxygen PRN For hd again today Afterload control  CARDIOVASCULAR A: Acute on chronic systolic / diastolic congestive heart failure.  Hypertensive emergency. P:  Goal BP<180/90 labetalol drip , after oral meds added, home can dc and extra hd Clonidine , HD repeat  ASA Re add Hydralazine Minoxidil In am likely to add home metoprolol, acei MAP goal 110-120 , 25% reduction  RENAL A:  ESRD. Hyponatremia.   Hyperkalemia. Metabolic acidosis. P:   Trend BMP Likely HD today  GASTROINTESTINAL A:  Elevated transaminases. Congestive hepatopathy? H/o cholelithiasis. P:   Renal diet GI Px is not indicated Tylenol level negative Acute hepatitis panel pending Trend LFT in am hope they are reducing after volume off RUQ [place] - Gallstones. No evidence of cholecystitis.  2. Fatty infiltration liver.  3. Increased echogenicity noted both kidneys, this suggests chronic  medical renal disease.   HEMATOLOGIC A:  Mild anemia, suspect chronic of renal disease. P:  Trend CBC with further volume off Heparin for DVT Px  INFECTIOUS A:  No overt infection.  Leucocytosis resolved P:   Add HCAP coverage if temp up  ENDOCRINE  A:  DM by history, but not on treatment.  P:   SSI  NEUROLOGIC A: resolved enceph P:   HTN control  I have personally obtained history, examined patient, evaluated and interpreted laboratory and imaging results, reviewed medical records, formulated assessment / plan and placed orders.  CRITICAL CARE:  The patient is critically ill with multiple organ systems failure and requires high complexity decision making for assessment and support, frequent evaluation and titration of therapies, application of advanced monitoring technologies and extensive interpretation of multiple databases. Critical Care Time devoted to patient care services described in this note is 30 minutes.   Raylene Miyamoto., MD Pulmonary and [place] Pager: [phone]  05/11/2013, 10:41 AM

## 2013-05-11 NOTE — Progress Notes (Signed)
Patient is transfer patient that came from [facility] but went to dialysis first prior to coming to [facility]. Patient has arrived from diaylysis via hospital bed and transferred into room 4E05. Oriented patient to room and call bell system. Currently patient complains of no pain or discomfort. Will continue to monitor to end of shift.

## 2013-05-11 NOTE — Progress Notes (Signed)
Patient ID: Joshua [name], male   DOB: 06-27-1980, 32 y.o.   MRN: [id number] S:feels better today.  Had HD late last night. "I slept through it" O:BP 190/109  Pulse 78  Temp(Src) 97.9 F (36.6 C) (Oral)  Resp 22  Ht 6' (1.829 m)  Wt 119.9 kg (264 lb 5.3 oz)  BMI 35.84 kg/m2  SpO2 99%  Intake/Output Summary (Last 24 hours) at 05/11/13 0827 Last data filed at 05/11/13 0800  Gross per 24 hour  Intake 1380.13 ml  Output   4206 ml  Net -2825.87 ml   Intake/Output: I/O last 3 completed shifts: In: 2024.3 [P.O.:100; I.V.:1924.3] Out: [id number] [Urine:2900; [id number]:[id number]; Blood:100]  Intake/Output this shift:  Total I/O In: 7.5 [I.V.:7.5] Out: -  Weight change: -1.7 kg (-3 lb 12 oz) Gen:WD obese AAM in NAD CVS:no rub Resp:occ bibasilar crackles [id number] Ext:no edema, LUE AVF +T/B   Recent Labs Lab 05/09/13 1122 05/09/13 1725 05/09/13 2135 05/10/13 0845 05/10/13 1020 05/11/13 0440  NA 131*  --   --  135  --  138  K 5.6*  --   --  4.5  --  4.2  CL 94*  --   --  100  --  105  CO2 12*  --   --  15*  --  17*  GLUCOSE 128*  --   --  125*  --  95  BUN 85*  --   --  86*  --  82*  CREATININE 7.03*  --   --  6.64*  --  6.26*  ALBUMIN 3.9 3.7  --   --   --  3.0*  CALCIUM 9.3  --   --  8.2*  --  7.9*  PHOS  --   --  5.3*  --  5.0* 5.8*  AST 259* 294*  --   --   --   --   ALT 470* 500*  --   --   --   --    Liver Function Tests:  Recent Labs Lab 05/09/13 1122 05/09/13 1725 05/11/13 0440  AST 259* 294*  --   ALT 470* 500*  --   ALKPHOS 205* 201*  --   BILITOT 2.6* 2.6*  --   PROT 8.2 7.7  --   ALBUMIN 3.9 3.7 3.0*   No results found for this basename: LIPASE, AMYLASE,  in the last 168 hours No results found for this basename: AMMONIA,  in the last 168 hours CBC:  Recent Labs Lab 05/09/13 1122 05/10/13 1020 05/11/13 0421  WBC 16.6* 14.8* 11.5*  NEUTROABS 13.4*  --   --   HGB 11.0* 10.3* 9.2*  HCT 32.7* 30.4* 27.4*  MCV 70.6* 71.0* 71.2*  PLT 307 214 193   Cardiac  Enzymes:  Recent Labs Lab 05/09/13 1122  TROPONINI <0.30   CBG:  Recent Labs Lab 05/10/13 0415 05/10/13 0749 05/10/13 1155 05/10/13 1644 05/10/13 1947  GLUCAP 128* 135* 143* 137* 119*    Iron Studies:  Recent Labs  05/09/13 2135  IRON 24*  TIBC 279  FERRITIN 154   Studies/Results: US Abdomen Port  05/10/2013   CLINICAL DATA:  Diabetes. Hypertension. Renal failure. Morbid obesity .  EXAM: ULTRASOUND PORTABLE ABDOMEN  COMPARISON:  04/12/2013.  FINDINGS: Gallbladder:  Multiple mobile gallstones again noted. Gallbladder wall thickness normal at 1.8 mm. Negative Murphy sign. No pericholecystic fluid collection.  Common bile duct:  Diameter: 5.3 mm.  Liver:  Increased echogenicity in the liver consistent with fatty infiltration.  IVC:  No abnormality visualized.  Pancreas:  Visualized portion unremarkable.  Spleen:  Size and appearance within normal limits.  Right Kidney:  Length: Left 0.1 cm. Echogenic consistent with chronic medical renal disease.  Left Kidney:  Length: 10.3 cm. Echogenic consistent chronic medical renal disease.  Abdominal aorta:  No aneurysm visualized.  Other findings:  None.  IMPRESSION: 1. Gallstones.  No evidence of cholecystitis. 2. Fatty infiltration liver. 3. Increased echogenicity noted both kidneys, this suggests chronic medical renal disease.   Electronically Signed   By: Marcello Moores  Register   On: 05/10/2013 12:19   Dg Chest Port 1 View  05/10/2013   CLINICAL DATA:  Rule out pneumothorax following catheter placement.  EXAM: PORTABLE CHEST - 1 VIEW  COMPARISON:  05/09/2013  FINDINGS: There has been interval placement of a right jugular approach dialysis catheter with tips overlying the lower SVC. No pneumothorax is identified. Lung volumes are mildly diminished compared to the prior exam with persistent pulmonary vascular congestion. Cardiac silhouette remains enlarged.  IMPRESSION: 1. Interval placement of right jugular dialysis catheter as above. No  pneumothorax identified. 2. Persistent cardiomegaly and pulmonary vascular congestion.   Electronically Signed   By: Logan Bores   On: 05/10/2013 17:02   Dg Chest Port 1 View  05/09/2013   CLINICAL DATA:  Left upper chest pain. Shortness of breath. Cough and nausea.  EXAM: PORTABLE CHEST - 1 VIEW  COMPARISON:  Two-view chest 04/11/2013.  FINDINGS: The heart is enlarged. Mild pulmonary vascular congestion is evident. No focal airspace consolidation is present. The visualized soft tissues and bony thorax are unremarkable.  IMPRESSION: 1. Stable cardiomegaly with slight progression of pulmonary vascular congestion suggesting early congestive heart failure. 2. No focal airspace consolidation.   Electronically Signed   By: Lawrence Santiago M.D.   On: 05/09/2013 11:51   Dg Fluoro Guide Cv Line-no Report  05/10/2013   CLINICAL DATA: diatek placement   FLOURO GUIDE CV LINE  Fluoroscopy was utilized by the requesting physician.  No radiographic  interpretation.    Marland Kitchen antiseptic oral rinse  15 mL Mouth Rinse BID  . aspirin  325 mg Oral Daily  . cloNIDine  0.2 mg Oral TID  . heparin subcutaneous  5,000 Units Subcutaneous Q8H  . insulin aspart  0-15 Units Subcutaneous TID WC  . insulin aspart  0-5 Units Subcutaneous QHS    BMET    Component Value Date/Time   NA 138 05/11/2013 0440   NA 135* 07/29/2012   K 4.2 05/11/2013 0440   CL 105 05/11/2013 0440   CO2 17* 05/11/2013 0440   GLUCOSE 95 05/11/2013 0440   BUN 82* 05/11/2013 0440   BUN 3* 07/29/2012   CREATININE 6.26* 05/11/2013 0440   CREATININE 3.41* 09/02/2012 1734   CREATININE 4.0* 07/29/2012   CALCIUM 7.9* 05/11/2013 0440   GFRNONAA 11* 05/11/2013 0440   GFRAA 12* 05/11/2013 0440   CBC    Component Value Date/Time   WBC 11.5* 05/11/2013 0421   RBC 3.85* 05/11/2013 0421   RBC 4.40 09/26/2012 1250   HGB 9.2* 05/11/2013 0421   HCT 27.4* 05/11/2013 0421   PLT 193 05/11/2013 0421   MCV 71.2* 05/11/2013 0421   MCH 23.9* 05/11/2013 0421   MCHC  33.6 05/11/2013 0421   RDW 23.2* 05/11/2013 0421   LYMPHSABS 1.5 05/09/2013 1122   MONOABS 1.5* 05/09/2013 1122   EOSABS 0.0 05/09/2013 1122   BASOSABS 0.2* 05/09/2013 1122     Assessment/ Plan:   1.  Malignant HTN- now on labetalol gtt and responding.   2. New ESRD: secondary to poorly controlled HTN. Tolerated HD well yesterday and will proceed with HD today and again tomorrow. 3. Anemia: dropping.  Will start ESA 4. Iron deficiency- will dose Feraheme IV and follow 5. CKD-MBD: follow Ca/Phos/iPTH and initiate vit D therapy dose pending iPTH level 6. Nutrition:follow 7. Hypertension:as above.  Improving with labetalol and HD 8. Vascular access: LUE AVF placed last month by Dr. Bridgett Larsson not yet mature for use, s/p PC placed yesterday by [facility].  9. HA/Abd pain- treat BP. Improved 10. Dispo- cont to titrate labetalol and arrange outpt HD 11.   [place] A

## 2013-05-12 ENCOUNTER — Encounter (HOSPITAL_COMMUNITY): Payer: Self-pay | Admitting: Vascular Surgery

## 2013-05-12 LAB — GLUCOSE, CAPILLARY
Glucose-Capillary: 186 mg/dL — ABNORMAL HIGH (ref 70–99)
Glucose-Capillary: 136 mg/dL — ABNORMAL HIGH (ref 70–99)

## 2013-05-12 MED ORDER — NEPRO/CARBSTEADY PO LIQD
237.0000 mL | Freq: Once | ORAL | Status: AC
  Administered 2013-05-12: 237 mL via ORAL
  Filled 2013-05-12: qty 237

## 2013-05-12 MED ORDER — HYDRALAZINE HCL 50 MG PO TABS
50.0000 mg | ORAL_TABLET | Freq: Three times a day (TID) | ORAL | Status: DC
  Administered 2013-05-13 – 2013-05-17 (×10): 50 mg via ORAL
  Filled 2013-05-12 (×15): qty 1

## 2013-05-12 NOTE — Consult Note (Signed)
Primary Physician: Primary Cardiologist:  Domenic Polite   HPI:  Asked to see re CHF  Patient is a 32 yo with a history of ESRD, HTN, ?atrial fibrillation, OSA and systolic CHF (EF [id number] by echo in Aug 2014 with mod/severe LVH; RVEF moderate to severely reduced)  He was admitted 3 wks ago for hypertensive urgency.   Admtted on 12/15 with weakness  Found to be in pulmonary edema  He has now undergone dialsys x 2 with signif fluid removial (6L)  The patient is breathing better at rest.  He denies ever having CP  Breathing now is OK at rest.  He said he does get SOB with activity. Says he does not get LE edema    Past Medical History  Diagnosis Date  . Arthritis   . CKD (chronic kidney disease) stage 4, GFR 15-29 ml/min   . Hypertension     Hypertensive urgency/emergency - no RAS, normal aldo/PRA ratio,high urine metanephrines and normetanephrines   . Atrial fibrillation   . Type II diabetes mellitus   . History of bronchitis   . Sleep apnea     No CPAP  . Headache(784.0)     Regular  . Anemia of chronic disease     Medications Prior to Admission  Medication Sig Dispense Refill  . acetaminophen (TYLENOL) 500 MG tablet Take 1,000 mg by mouth every 6 (six) hours as needed. For pain      . aspirin 325 MG tablet Take 325 mg by mouth daily.      . cloNIDine (CATAPRES) 0.2 MG tablet Take 1 tablet (0.2 mg total) by mouth 3 (three) times daily.  90 tablet  0  . furosemide (LASIX) 80 MG tablet Take 2 tablets (160 mg total) by mouth 2 (two) times daily.  60 tablet  0  . hydrALAZINE (APRESOLINE) 100 MG tablet Take 1 tablet (100 mg total) by mouth 3 (three) times daily.  90 tablet  1  . lisinopril (PRINIVIL,ZESTRIL) 20 MG tablet Take 2 tablets (40 mg total) by mouth 2 (two) times daily.  60 tablet  0  . metoprolol (LOPRESSOR) 100 MG tablet Take 1 tablet (100 mg total) by mouth daily.  60 tablet  0  . minoxidil (LONITEN) 2.5 MG tablet Take 2 tablets (5 mg total) by mouth 2 (two) times daily.  60  tablet  0  . Multiple Vitamin (MULTIVITAMIN WITH MINERALS) TABS tablet Take 1 tablet by mouth daily.      . ondansetron (ZOFRAN) 4 MG tablet Take 4 mg by mouth every 8 (eight) hours as needed for nausea or vomiting.      . sodium bicarbonate 650 MG tablet Take 1 tablet (650 mg total) by mouth 2 (two) times daily.  60 tablet  0     . antiseptic oral rinse  15 mL Mouth Rinse BID  . aspirin  325 mg Oral Daily  . cloNIDine  0.2 mg Oral TID  . heparin subcutaneous  5,000 Units Subcutaneous Q8H  . hydrALAZINE  100 mg Oral Q8H  . insulin aspart  0-15 Units Subcutaneous TID WC  . insulin aspart  0-5 Units Subcutaneous QHS  . minoxidil  5 mg Oral BID    Infusions: . labetalol (NORMODYNE) infusion Stopped (05/11/13 1100)    Allergies  Allergen Reactions  . Isosorbide Dinitrate     Severe headaches with po nitrates (imdur, isordil), do not prescribe  . Nitrates, Organic     Severe headaches with po nitrates (imdur, isordil,  etc), do not prescribe    History   Social History  . Marital Status: Single    Spouse Name: N/A    Number of Children: N/A  . Years of Education: N/A   Occupational History  . Not on file.   Social History Main Topics  . Smoking status: Former Smoker -- .1 years    Types: Cigarettes    Quit date: 09/24/1999  . Smokeless tobacco: Never Used  . Alcohol Use: No  . Drug Use: No  . Sexual Activity: Yes   Other Topics Concern  . Not on file   Social History Narrative  . No narrative on file    Family History  Problem Relation Age of Onset  . Diabetes Father   . Hypertension Father   . Hypertension Maternal Grandmother   . Hypertension Maternal Grandfather   . Kidney disease Maternal Grandfather   . Diabetes Maternal Grandfather     REVIEW OF SYSTEMS:  All systems reviewed  Negative to the above problem except as noted above.    PHYSICAL EXAM: Filed Vitals:   05/12/13 0909  BP: 143/74  Pulse: 90  Temp:   Resp:      Intake/Output Summary  (Last 24 hours) at 05/12/13 1324 Last data filed at 05/12/13 0842  Gross per 24 hour  Intake   1085 ml  Output   2884 ml  Net  -1799 ml    General:  Well appearing. No respiratory difficulty HEENT: normal Neck: supple. no JVD. Carotids 2+ bilat; no bruits. No lymphadenopathy or thryomegaly appreciated. Cor: PMI nondisplaced. Regular rate & rhythm. No rubs, gallops or murmurs. Lungs: clear Abdomen: soft, nontender, nondistended. No hepatosplenomegaly. No bruits or masses. Good bowel sounds. Extremities: no cyanosis, clubbing, rash, edema Neuro: alert & oriented x 3, cranial nerves grossly intact. moves all 4 extremities w/o difficulty. Affect pleasant.  ECG:  ST 108 bpm   QTc 490  Results for orders placed during the hospital encounter of 05/09/13 (from the past 24 hour(s))  GLUCOSE, CAPILLARY     Status: Abnormal   Collection Time    05/11/13  5:30 PM      Result Value Range   Glucose-Capillary 150 (*) 70 - 99 mg/dL  GLUCOSE, CAPILLARY     Status: Abnormal   Collection Time    05/11/13 10:41 PM      Result Value Range   Glucose-Capillary 102 (*) 70 - 99 mg/dL  GLUCOSE, CAPILLARY     Status: Abnormal   Collection Time    05/12/13  6:25 AM      Result Value Range   Glucose-Capillary 186 (*) 70 - 99 mg/dL  GLUCOSE, CAPILLARY     Status: Abnormal   Collection Time    05/12/13 11:09 AM      Result Value Range   Glucose-Capillary 116 (*) 70 - 99 mg/dL   Comment 1 Notify RN     Dg Chest Port 1 View  05/10/2013   CLINICAL DATA:  Rule out pneumothorax following catheter placement.  EXAM: PORTABLE CHEST - 1 VIEW  COMPARISON:  05/09/2013  FINDINGS: There has been interval placement of a right jugular approach dialysis catheter with tips overlying the lower SVC. No pneumothorax is identified. Lung volumes are mildly diminished compared to the prior exam with persistent pulmonary vascular congestion. Cardiac silhouette remains enlarged.  IMPRESSION: 1. Interval placement of right  jugular dialysis catheter as above. No pneumothorax identified. 2. Persistent cardiomegaly and pulmonary vascular congestion.   Electronically  Signed   By: Logan Bores   On: 05/10/2013 17:02   Dg Fluoro Guide Cv Line-no Report  05/10/2013   CLINICAL DATA: diatek placement   FLOURO GUIDE CV LINE  Fluoroscopy was utilized by the requesting physician.  No radiographic  interpretation.      ASSESSMENT: 1.  CHF Acute on chronic systolic CHF  Patient is a 32 yo with acute on chronic systolic CHF in the setting of ESRD>  He is now improved clinically after extensive removal of excess fluid at dialysis.  Patient has been seen by cardiology earlier in the fall.   His myopathy is presumed secondary to uncontrolled HTN given that his ventricle is so thick and QRS voltages are so prominent.  Note that immunofixation was negatve for light chains Did completely r/o transthyretin amyloid but clinically less likely  Ischemic w/u was not done given signif LVH, lack of CP  and EKG with lack of q waves   Not unreasonable to do for completeness.  Will sched  Current volume  status appesrs improved.   Would start CHF  meds  Hold b blocker and nitrate until after myoview  2.  HTn  Needs aggressive control  3.  ESRD   COntinue dialysis.

## 2013-05-12 NOTE — Progress Notes (Signed)
[facility] Progress Note [place] TEAM 1 - Stepdown ICU Team    Joshua [name] [id number] DOB: 12-20-1980 DOA: 05/09/2013 PCP: Provider Not In System  Brief narrative: 32 yo with past medical history of ESRD (scheduled to start HD), HTN, CHF (Ef 30%) brought to [facility] ED with weakness and headache. He also reported dyspnea, orthopnea and non productive cough. Noted to be hypertensive. Transferred [facility] in anticipation of needing HD.   Assessment/Plan: Active Problems:   CKD (chronic kidney disease), stage V -has progressed to requiring HD this admission -renal managing -Diatek catheter placed this admission; had a left upper extremity AV fistula placed in November but per vascular team not mature for use yet    Acute on chronic combined systolic and diastolic CHF (congestive heart failure) -due to need for impending HD and poorly controlled HTN -EF on ECHO Aug 2013: 30% with diffuse hypokinesis and severe LVH likely HTN etiology -has never had ischemic eval so have consulted Cards -also there was a question of possible amyloid    Hypertensive emergency -due to volume overload and uncontrolled HTN -Required labetalol infusion initially -further exacerbated by noncompliance with meds -resolved and stable on oral meds and HD    ? Diabetes -Hgb A1c was 5.6 in September OFF meds    Anemia in chronic kidney disease -per nephro    Transaminitis/Cholelithiases -elevated LFTs likely due to passive hepato congestion from HF -has cholelithiasis but [place] this admit without cholecystitis -repeat LFTs in am and follow clinically    Noncompliance -hopefully now that on HD can be followed regularly and will have financial access to meds- hopefully will adhere to HD schedule      DVT prophylaxis: SCDs Code Status: Full Family Communication: Patient only, no family at bedside Disposition Plan/Expected LOS: Remain on renal floor-hopefully can discharge in the next 2-3 days pending  cardiac evaluation and tolerance to initiation of hemodialysis Isolation: None Nutritional Status: Stable  Consultants: Nephrology Cardiology PCCM  Procedures: Ultrasound guided insertion of Diatek catheter  12/16  Antibiotics: None  HPI/Subjective: Patient alert and without any specific complaints. Poor historian  Objective: Blood pressure 143/74, pulse 90, temperature 98.2 F (36.8 C), temperature source Oral, resp. rate 20, height 5' 11.5" (1.816 m), weight 257 lb 8 oz (116.8 kg), SpO2 100.00%.  Intake/Output Summary (Last 24 hours) at 05/12/13 1349 Last data filed at 05/12/13 [id number]  Gross per 24 hour  Intake   1085 ml  Output   2884 ml  Net  -1799 ml     Exam: General: No acute respiratory distress Lungs: Clear to auscultation bilaterally without wheezes or crackles, RA Cardiovascular: Regular rate and rhythm without murmur gallop or rub normal S1 and S2, no peripheral edema or JVD Abdomen: Nontender, nondistended, soft, bowel sounds positive, no rebound, no ascites, no appreciable mass Musculoskeletal: No significant cyanosis, clubbing of bilateral lower extremities Neurological: Alert and oriented x 3, moves all extremities x 4 without focal neurological deficits, CN 2-12 intact  Scheduled Meds:  Scheduled Meds: . antiseptic oral rinse  15 mL Mouth Rinse BID  . aspirin  325 mg Oral Daily  . cloNIDine  0.2 mg Oral TID  . heparin subcutaneous  5,000 Units Subcutaneous Q8H  . hydrALAZINE  100 mg Oral Q8H  . insulin aspart  0-15 Units Subcutaneous TID WC  . insulin aspart  0-5 Units Subcutaneous QHS  . minoxidil  5 mg Oral BID   Continuous Infusions: . labetalol (NORMODYNE) infusion Stopped (05/11/13 1100)    **  Reviewed in detail by the Attending Physician  Data Reviewed: Basic Metabolic Panel:  Recent Labs Lab 05/09/13 1122 05/09/13 2135 05/10/13 0845 05/10/13 1020 05/11/13 0440  NA 131*  --  135  --  138  K 5.6*  --  4.5  --  4.2  CL 94*  --  100   --  105  CO2 12*  --  15*  --  17*  GLUCOSE 128*  --  125*  --  95  BUN 85*  --  86*  --  82*  CREATININE 7.03*  --  6.64*  --  6.26*  CALCIUM 9.3  --  8.2*  --  7.9*  PHOS  --  5.3*  --  5.0* 5.8*   Liver Function Tests:  Recent Labs Lab 05/09/13 1122 05/09/13 1725 05/11/13 0440  AST 259* 294*  --   ALT 470* 500*  --   ALKPHOS 205* 201*  --   BILITOT 2.6* 2.6*  --   PROT 8.2 7.7  --   ALBUMIN 3.9 3.7 3.0*   No results found for this basename: LIPASE, AMYLASE,  in the last 168 hours No results found for this basename: AMMONIA,  in the last 168 hours CBC:  Recent Labs Lab 05/09/13 1122 05/10/13 1020 05/11/13 0421  WBC 16.6* 14.8* 11.5*  NEUTROABS 13.4*  --   --   HGB 11.0* 10.3* 9.2*  HCT 32.7* 30.4* 27.4*  MCV 70.6* 71.0* 71.2*  PLT 307 214 193   Cardiac Enzymes:  Recent Labs Lab 05/09/13 1122  TROPONINI <0.30   BNP (last 3 results)  Recent Labs  01/06/13 1847 01/24/13 0720 05/09/13 1122  PROBNP 61864.0* >70000.0* >70000.0*   CBG:  Recent Labs Lab 05/11/13 1055 05/11/13 1730 05/11/13 2241 05/12/13 0625 05/12/13 1109  GLUCAP 121* 150* 102* 186* 116*    Recent Results (from the past 240 hour(s))  MRSA PCR SCREENING     Status: None   Collection Time    05/09/13  4:00 PM      Result Value Range Status   MRSA by PCR NEGATIVE  NEGATIVE Final   Comment:            The GeneXpert MRSA Assay (FDA     approved for NASAL specimens     only), is one component of a     comprehensive MRSA colonization     surveillance program. It is not     intended to diagnose MRSA     infection nor to guide or     monitor treatment for     MRSA infections.     Studies:  Recent x-ray studies have been reviewed in detail by the Attending Physician     Erin Hearing, ANP [facility] Office  [phone] Pager [phone]  **If unable to reach the above provider after paging please contact the [facility] @ [phone]  On-Call/Text Page:       [email or web address]      password [email or web address]  If 7PM-7AM, please contact night-coverage [email or web address] Password [email or web address] 05/12/2013, 1:49 PM   LOS: 3 days   I have examined the patient, reviewed the chart and modified the above note which I agree with.   Coren Crownover,MD [id number] 05/12/2013, 3:51 PM

## 2013-05-12 NOTE — Progress Notes (Signed)
Report given to receiving RN. Patient is in stable condition. No verbal complaints and no signs or symptoms of distress or discomfort.

## 2013-05-12 NOTE — Progress Notes (Signed)
Patient ID: Joshua [name], male   DOB: 12-Oct-1980, 32 y.o.   MRN: [id number]  [facility] Progress Note    Subjective:   Feels better today   Objective:   BP 143/74  Pulse 90  Temp(Src) 98.2 F (36.8 C) (Oral)  Resp 20  Ht 5' 11.5" (1.816 m)  Wt 116.8 kg (257 lb 8 oz)  BMI 35.42 kg/m2  SpO2 100%  Intake/Output: I/O last 3 completed shifts: In: 1629.5 [P.O.:820; I.V.:375.5; Other:300; IV Piggyback:134] Out: [id number] [Urine:2675; Other:4240]   Intake/Output this shift:  Total I/O In: 240 [P.O.:240] Out: 100 [Urine:100] Weight change: -0.3 kg (-10.6 oz)  Physical Exam: Gen:WD WN AAM in NAD CVS:no rub Resp:cta [id number] Ext:tr pedal edema, LUE AVF +T/B  Labs: BMET  Recent Labs Lab 05/09/13 1122 05/09/13 1725 05/09/13 2135 05/10/13 0845 05/10/13 1020 05/11/13 0440  NA 131*  --   --  135  --  138  K 5.6*  --   --  4.5  --  4.2  CL 94*  --   --  100  --  105  CO2 12*  --   --  15*  --  17*  GLUCOSE 128*  --   --  125*  --  95  BUN 85*  --   --  86*  --  82*  CREATININE 7.03*  --   --  6.64*  --  6.26*  ALBUMIN 3.9 3.7  --   --   --  3.0*  CALCIUM 9.3  --   --  8.2*  --  7.9*  PHOS  --   --  5.3*  --  5.0* 5.8*   CBC  Recent Labs Lab 05/09/13 1122 05/10/13 1020 05/11/13 0421  WBC 16.6* 14.8* 11.5*  NEUTROABS 13.4*  --   --   HGB 11.0* 10.3* 9.2*  HCT 32.7* 30.4* 27.4*  MCV 70.6* 71.0* 71.2*  PLT 307 214 193    @IMGRELPRIORS @ Medications:    . antiseptic oral rinse  15 mL Mouth Rinse BID  . aspirin  325 mg Oral Daily  . cloNIDine  0.2 mg Oral TID  . heparin subcutaneous  5,000 Units Subcutaneous Q8H  . hydrALAZINE  100 mg Oral Q8H  . insulin aspart  0-15 Units Subcutaneous TID WC  . insulin aspart  0-5 Units Subcutaneous QHS  . minoxidil  5 mg Oral BID     Assessment/ Plan:   1. Malignant HTN- now on po meds. 2. New ESRD: secondary to poorly controlled HTN. Tolerated HD well yesterday and will proceed with HD  today. 3. Anemia: dropping. Will start ESA 4. Iron deficiency- will dose Feraheme IV and follow 5. CKD-MBD: follow Ca/Phos/iPTH and initiate vit D therapy dose pending iPTH level 6. Nutrition:follow 7. Hypertension:as above. Improving with labetalol and HD 8. Vascular access: LUE AVF placed last month by Dr. Bridgett Larsson not yet mature for use, s/p PC placed 12/16 by [facility].  9. HA/Abd pain- treat BP. Improved 10. Dispo- cont with BP control and awaiting outpt HD arrangements  Darrold Bezek A 05/12/2013, 11:28 AM

## 2013-05-13 ENCOUNTER — Encounter (HOSPITAL_COMMUNITY): Payer: Medicaid Other

## 2013-05-13 ENCOUNTER — Ambulatory Visit (HOSPITAL_COMMUNITY): Payer: Medicaid Other

## 2013-05-13 ENCOUNTER — Inpatient Hospital Stay (HOSPITAL_COMMUNITY): Payer: Medicaid Other

## 2013-05-13 DIAGNOSIS — E860 Dehydration: Secondary | ICD-10-CM

## 2013-05-13 DIAGNOSIS — I119 Hypertensive heart disease without heart failure: Secondary | ICD-10-CM

## 2013-05-13 DIAGNOSIS — Z9119 Patient's noncompliance with other medical treatment and regimen: Secondary | ICD-10-CM

## 2013-05-13 LAB — GLUCOSE, CAPILLARY
Comment 1: 120001804
Glucose-Capillary: 143 mg/dL — ABNORMAL HIGH (ref 70–99)
Glucose-Capillary: 114 mg/dL — ABNORMAL HIGH (ref 70–99)
Glucose-Capillary: 117 mg/dL — ABNORMAL HIGH (ref 70–99)

## 2013-05-13 LAB — COMPREHENSIVE METABOLIC PANEL
ALT: 164 U/L — ABNORMAL HIGH (ref 0–53)
AST: 28 U/L (ref 0–37)
Albumin: 3 g/dL — ABNORMAL LOW (ref 3.5–5.2)
Alkaline Phosphatase: 137 U/L — ABNORMAL HIGH (ref 39–117)
Calcium: 7.9 mg/dL — ABNORMAL LOW (ref 8.4–10.5)
GFR calc non Af Amer: 23 mL/min — ABNORMAL LOW (ref >90)
Potassium: 3.4 mEq/L — ABNORMAL LOW (ref 3.5–5.1)
Sodium: 139 mEq/L (ref 135–145)
Total Protein: 6.6 g/dL (ref 6.0–8.3)

## 2013-05-13 LAB — CBC
HCT: 28.2 % — ABNORMAL LOW (ref 39.0–52.0)
Hemoglobin: 9.2 g/dL — ABNORMAL LOW (ref 13.0–17.0)
MCHC: 32.6 g/dL (ref 30.0–36.0)
MCV: 70.9 fL — ABNORMAL LOW (ref 78.0–100.0)
Platelets: 139 10*3/uL — ABNORMAL LOW (ref 150–400)
RBC: 3.98 MIL/uL — ABNORMAL LOW (ref 4.22–5.81)
RDW: 23 % — ABNORMAL HIGH (ref 11.5–15.5)

## 2013-05-13 MED ORDER — TECHNETIUM TC 99M SESTAMIBI GENERIC - CARDIOLITE
30.0000 | Freq: Once | INTRAVENOUS | Status: AC | PRN
  Administered 2013-05-13: 30 via INTRAVENOUS

## 2013-05-13 MED ORDER — TECHNETIUM TC 99M SESTAMIBI GENERIC - CARDIOLITE
10.0000 | Freq: Once | INTRAVENOUS | Status: AC | PRN
  Administered 2013-05-13: 10 via INTRAVENOUS

## 2013-05-13 MED ORDER — REGADENOSON 0.4 MG/5ML IV SOLN
INTRAVENOUS | Status: AC
  Administered 2013-05-13: 0.4 mg via INTRAVENOUS
  Filled 2013-05-13: qty 5

## 2013-05-13 MED ORDER — REGADENOSON 0.4 MG/5ML IV SOLN
0.4000 mg | Freq: Once | INTRAVENOUS | Status: AC
  Administered 2013-05-13: 0.4 mg via INTRAVENOUS
  Filled 2013-05-13: qty 5

## 2013-05-13 NOTE — Plan of Care (Signed)
Problem: Food- and Nutrition-Related Knowledge Deficit (NB-1.1) Goal: Nutrition education Formal process to instruct or train a patient/client in a skill or to impart knowledge to help patients/clients voluntarily manage or modify food choices and eating behavior to maintain or improve health. Outcome: Completed/Met Date Met:  05/13/13 Nutrition Education Note  RD consulted for Renal Education. Provided Choose-A-Meal Booklet to patient/family. Reviewed food groups and provided written recommended serving sizes specifically determined for patient's current nutritional status.   Explained why diet restrictions are needed and provided lists of foods to limit/avoid that are high potassium, sodium, and phosphorus. Provided specific recommendations on safer alternatives of these foods. Strongly encouraged compliance of this diet.   Discussed importance of protein intake at each meal and snack. Provided examples of how to maximize protein intake throughout the day. Discussed need for fluid restriction with dialysis, importance of minimizing weight gain between HD treatments, and renal-friendly beverage options.  Encouraged pt to discuss specific diet questions/concerns with RD at HD outpatient facility. Teach back method used.  Expect fair compliance.  Body mass index is 34.69 kg/(m^2). Pt meets criteria for Obesity grade 1 based on current BMI.  Current diet order is 80/90-2-2 RF with 1200 ml FR, patient is consuming approximately 0% of meals at this time. Labs and medications reviewed. No further nutrition interventions warranted at this time. RD contact information provided. If additional nutrition issues arise, please re-consult RD.  Patient reports that he is doing well with his meals.  Followed no salt, no sweet diet prior to admit.  Provided patient with HD choose a meals, book, sample meal plans, snack ideas, and cookbook pamphlet.  Patient with no questions at this time.  Antonieta Iba, RD,  LDN Clinical Inpatient Dietitian Pager:  [phone] Weekend and after hours pager:  [phone]

## 2013-05-13 NOTE — Progress Notes (Signed)
[facility]                                                                                Patient Demographics  Joshua [name], is a 32 y.o. male, DOB - 30-Apr-1981, [id number]  Admit date - 05/09/2013   Admitting Physician Doree Fudge, MD  Outpatient Primary MD for the patient is Provider Not In System  LOS - 4   Chief Complaint  Patient presents with  . Nausea  . Hypertension        Assessment & Plan  Active Problems:   ? Diabetes   CKD (chronic kidney disease), stage V   Anemia in chronic kidney disease   Transaminitis   Acute on chronic combined systolic and diastolic CHF (congestive heart failure)   Noncompliance   Cholelithiases   Hypertensive emergency   Hypertensive cardiovascular disease  CKD (chronic kidney disease), stage V  -Nephrology consulted. -Hemodialysis has resumed and then arranged for as an outpatient by nephrology -Diatek catheter placed this admission; had a left upper extremity AV fistula placed in November but per vascular team not mature for use yet   Acute on chronic combined systolic and diastolic CHF (congestive heart failure)  -due to need for impending HD and poorly controlled HTN  -EF on ECHO Aug 2013: 30% with diffuse hypokinesis and severe LVH likely HTN etiology  -has never had ischemic evaluation -cardiology consulted -Stress/myoview scheduled for today  Hypertensive emergency  -due to volume overload and uncontrolled HTN  -patient did not receive medications overnight due to n.p.o. status likely uncontrolled at this time secondary to rebound -Required labetalol infusion initially   ? Diabetes  -Hgb A1c was 5.6 in September OFF meds   Anemia of chronic kidney disease  -stable, H/H 9.2/28.2 -No active signs of bleeding, will continue to monitor CBC  Transaminitis/Cholelithiases  -elevated LFTs likely due to passive hepato-congestion from heart failure -has cholelithiasis but [place] this admit without  cholecystitis  -trending downward, will continue to monitor    Noncompliance  -Counseled, hemodialysis as an outpatient has been arranged for by nephrology  Code Status: Full  Family Communication: None at this time.  Patient does not wish for [place] to contact anyone.  Disposition Plan: Admitted   Procedures  Stress Test Ultrasound guided insertion of Diatek catheter 12/16  Consults   Cardiology Nephrology PCCM  DVT Prophylaxis  SCDs  Lab Results  Component Value Date   PLT 139* 05/13/2013    Medications  Scheduled Meds: . antiseptic oral rinse  15 mL Mouth Rinse BID  . aspirin  325 mg Oral Daily  . cloNIDine  0.2 mg Oral TID  . hydrALAZINE  50 mg Oral Q8H  . insulin aspart  0-15 Units Subcutaneous TID WC  . insulin aspart  0-5 Units Subcutaneous QHS  . minoxidil  5 mg Oral BID  . regadenoson      . regadenoson  0.4 mg Intravenous Once   Continuous Infusions: . labetalol (NORMODYNE) infusion Stopped (05/11/13 1100)   PRN Meds:.fentaNYL, technetium sestamibi generic  Antibiotics   Anti-infectives   None       Time Spent in minutes   30 minutes   Joshua [name]  D.O. on 05/13/2013 at 12:40 PM  Between 7am to 7pm - Pager - [phone]  After 7pm go to [email or web address] - password [email or web address]  And look for the night coverage person covering for me after hours  [facility] Office  [phone]    Subjective:   Joshua [name] seen and examined today.   Patient has no complaints today. However seems to be agitated.  Objective:   Filed Vitals:   05/12/13 2044 05/13/13 0610 05/13/13 1125 05/13/13 1218  BP: 164/88 158/74 220/109 213/116  Pulse: 93 95 93 94  Temp: 98.3 F (36.8 C) 98.4 F (36.9 C)    TempSrc: Oral Oral    Resp: 20 20    Height:      Weight:  114.397 kg (252 lb 3.2 oz)    SpO2: 94% 93%      Wt Readings from Last 3 Encounters:  05/13/13 114.397 kg (252 lb 3.2 oz)  05/13/13 114.397 kg (252 lb 3.2 oz)  04/19/13 114.4 kg (252  lb 3.3 oz)     Intake/Output Summary (Last 24 hours) at 05/13/13 1240 Last data filed at 05/13/13 0900  Gross per 24 hour  Intake    700 ml  Output   4090 ml  Net  -3390 ml    Exam  General: Well developed, well nourished, NAD, appears stated age  HEENT: NCAT, PERRLA, EOMI, Anicteic Sclera, mucous membranes moist. No pharyngeal erythema or exudates  Neck: Supple, no JVD, no masses  Cardiovascular: S1 S2 auscultated, no rubs, murmurs or gallops. Regular rate and rhythm.  Respiratory: Clear to auscultation bilaterally with equal chest rise  Abdomen: Soft, nontender, nondistended, + bowel sounds  Extremities: warm dry without cyanosis clubbing or edema  Neuro: AAOx3, cranial nerves grossly intact. Strength 5/5 in patient's upper and lower extremities bilaterally  Skin: Without rashes exudates or nodules  Psych: Normal affect and demeanor with intact judgement and insight  Data Review   Micro Results Recent Results (from the past 240 hour(s))  MRSA PCR SCREENING     Status: None   Collection Time    05/09/13  4:00 PM      Result Value Range Status   MRSA by PCR NEGATIVE  NEGATIVE Final   Comment:            The GeneXpert MRSA Assay (FDA     approved for NASAL specimens     only), is one component of a     comprehensive MRSA colonization     surveillance program. It is not     intended to diagnose MRSA     infection nor to guide or     monitor treatment for     MRSA infections.    Radiology Reports US Abdomen Port  05/10/2013   CLINICAL DATA:  Diabetes. Hypertension. Renal failure. Morbid obesity .  EXAM: ULTRASOUND PORTABLE ABDOMEN  COMPARISON:  04/12/2013.  FINDINGS: Gallbladder:  Multiple mobile gallstones again noted. Gallbladder wall thickness normal at 1.8 mm. Negative Murphy sign. No pericholecystic fluid collection.  Common bile duct:  Diameter: 5.3 mm.  Liver:  Increased echogenicity in the liver consistent with fatty infiltration.  IVC:  No abnormality  visualized.  Pancreas:  Visualized portion unremarkable.  Spleen:  Size and appearance within normal limits.  Right Kidney:  Length: Left 0.1 cm. Echogenic consistent with chronic medical renal disease.  Left Kidney:  Length: 10.3 cm. Echogenic consistent chronic medical renal disease.  Abdominal aorta:  No aneurysm visualized.  Other findings:  None.  IMPRESSION: 1. Gallstones.  No evidence of cholecystitis. 2. Fatty infiltration liver. 3. Increased echogenicity noted both kidneys, this suggests chronic medical renal disease.   Electronically Signed   By: Marcello Moores  Register   On: 05/10/2013 12:19   Dg Chest Port 1 View  05/10/2013   CLINICAL DATA:  Rule out pneumothorax following catheter placement.  EXAM: PORTABLE CHEST - 1 VIEW  COMPARISON:  05/09/2013  FINDINGS: There has been interval placement of a right jugular approach dialysis catheter with tips overlying the lower SVC. No pneumothorax is identified. Lung volumes are mildly diminished compared to the prior exam with persistent pulmonary vascular congestion. Cardiac silhouette remains enlarged.  IMPRESSION: 1. Interval placement of right jugular dialysis catheter as above. No pneumothorax identified. 2. Persistent cardiomegaly and pulmonary vascular congestion.   Electronically Signed   By: Logan Bores   On: 05/10/2013 17:02   Dg Chest Port 1 View  05/09/2013   CLINICAL DATA:  Left upper chest pain. Shortness of breath. Cough and nausea.  EXAM: PORTABLE CHEST - 1 VIEW  COMPARISON:  Two-view chest 04/11/2013.  FINDINGS: The heart is enlarged. Mild pulmonary vascular congestion is evident. No focal airspace consolidation is present. The visualized soft tissues and bony thorax are unremarkable.  IMPRESSION: 1. Stable cardiomegaly with slight progression of pulmonary vascular congestion suggesting early congestive heart failure. 2. No focal airspace consolidation.   Electronically Signed   By: Lawrence Santiago M.D.   On: 05/09/2013 11:51   Dg Fluoro Guide  Cv Line-no Report  05/10/2013   CLINICAL DATA: diatek placement   FLOURO GUIDE CV LINE  Fluoroscopy was utilized by the requesting physician.  No radiographic  interpretation.     CBC  Recent Labs Lab 05/09/13 1122 05/10/13 1020 05/11/13 0421 05/13/13 0435  WBC 16.6* 14.8* 11.5* 8.8  HGB 11.0* 10.3* 9.2* 9.2*  HCT 32.7* 30.4* 27.4* 28.2*  PLT 307 214 193 139*  MCV 70.6* 71.0* 71.2* 70.9*  MCH 23.8* 24.1* 23.9* 23.1*  MCHC 33.6 33.9 33.6 32.6  RDW 23.2* 23.0* 23.2* 23.0*  LYMPHSABS 1.5  --   --   --   MONOABS 1.5*  --   --   --   EOSABS 0.0  --   --   --   BASOSABS 0.2*  --   --   --     Chemistries   Recent Labs Lab 05/09/13 1122 05/09/13 1725 05/10/13 0845 05/11/13 0440 05/13/13 0435  NA 131*  --  135 138 139  K 5.6*  --  4.5 4.2 3.4*  CL 94*  --  100 105 100  CO2 12*  --  15* 17* 29  GLUCOSE 128*  --  125* 95 104*  BUN 85*  --  86* 82* 28*  CREATININE 7.03*  --  6.64* 6.26* 3.31*  CALCIUM 9.3  --  8.2* 7.9* 7.9*  AST 259* 294*  --   --  28  ALT 470* 500*  --   --  164*  ALKPHOS 205* 201*  --   --  137*  BILITOT 2.6* 2.6*  --   --  0.8   ------------------------------------------------------------------------------------------------------------------ estimated creatinine clearance is 41.9 ml/min (by C-G formula based on Cr of 3.31). ------------------------------------------------------------------------------------------------------------------ No results found for this basename: HGBA1C,  in the last 72 hours ------------------------------------------------------------------------------------------------------------------ No results found for this basename: CHOL, HDL, LDLCALC, TRIG, CHOLHDL, LDLDIRECT,  in the last 72 hours ------------------------------------------------------------------------------------------------------------------ No results found for this basename: TSH, T4TOTAL, FREET3, T3FREE, THYROIDAB,  in the last 72  hours ------------------------------------------------------------------------------------------------------------------ No results found for this basename: VITAMINB12, FOLATE, FERRITIN, TIBC, IRON, RETICCTPCT,  in the last 72 hours  Coagulation profile No results found for this basename: INR, PROTIME,  in the last 168 hours  No results found for this basename: DDIMER,  in the last 72 hours  Cardiac Enzymes  Recent Labs Lab 05/09/13 1122  TROPONINI <0.30   ------------------------------------------------------------------------------------------------------------------ No components found with this basename: POCBNP,

## 2013-05-13 NOTE — Progress Notes (Signed)
Lexiscan CL performed. Pt tolerated well.

## 2013-05-13 NOTE — Progress Notes (Signed)
Patient ID: Joshua [name], male   DOB: [birth year], 32 y.o.   MRN: [id number]    SUBJECTIVE: Patient is comfortable today. His blood pressure has been quite high as he did not receive his morning medicines because of  have his nuclear study.   Filed Vitals:   05/12/13 1635 05/12/13 1711 05/12/13 2044 05/13/13 0610  BP: 154/95 166/88 164/88 158/74  Pulse: 86 87 93 95  Temp: 98.4 F (36.9 C) 98.5 F (36.9 C) 98.3 F (36.8 C) 98.4 F (36.9 C)  TempSrc: Oral Oral Oral Oral  Resp: 16 16 20 20   Height:      Weight: 254 lb 3.1 oz (115.3 kg)   252 lb 3.2 oz (114.397 kg)  SpO2: 100% 100% 94% 93%     Intake/Output Summary (Last 24 hours) at 05/13/13 0923 Last data filed at 05/13/13 0900  Gross per 24 hour  Intake    700 ml  Output   4090 ml  Net  -3390 ml    LABS: Basic Metabolic Panel:  Recent Labs  05/10/13 1020 05/11/13 0440 05/13/13 0435  NA  --  138 139  K  --  4.2 3.4*  CL  --  105 100  CO2  --  17* 29  GLUCOSE  --  95 104*  BUN  --  82* 28*  CREATININE  --  6.26* 3.31*  CALCIUM  --  7.9* 7.9*  PHOS 5.0* 5.8*  --    Liver Function Tests:  Recent Labs  05/11/13 0440 05/13/13 0435  AST  --  28  ALT  --  164*  ALKPHOS  --  137*  BILITOT  --  0.8  PROT  --  6.6  ALBUMIN 3.0* 3.0*   No results found for this basename: LIPASE, AMYLASE,  in the last 72 hours CBC:  Recent Labs  05/11/13 0421 05/13/13 0435  WBC 11.5* 8.8  HGB 9.2* 9.2*  HCT 27.4* 28.2*  MCV 71.2* 70.9*  PLT 193 139*   Cardiac Enzymes: No results found for this basename: CKTOTAL, CKMB, CKMBINDEX, TROPONINI,  in the last 72 hours BNP: No components found with this basename: POCBNP,  D-Dimer: No results found for this basename: DDIMER,  in the last 72 hours Hemoglobin A1C: No results found for this basename: HGBA1C,  in the last 72 hours Fasting Lipid Panel: No results found for this basename: CHOL, HDL, LDLCALC, TRIG, CHOLHDL, LDLDIRECT,  in the last 72 hours Thyroid Function  Tests: No results found for this basename: TSH, T4TOTAL, FREET3, T3FREE, THYROIDAB,  in the last 72 hours  RADIOLOGY: US Abdomen Port  05/10/2013   CLINICAL DATA:  Diabetes. Hypertension. Renal failure. Morbid obesity .  EXAM: ULTRASOUND PORTABLE ABDOMEN  COMPARISON:  04/12/2013.  FINDINGS: Gallbladder:  Multiple mobile gallstones again noted. Gallbladder wall thickness normal at 1.8 mm. Negative Murphy sign. No pericholecystic fluid collection.  Common bile duct:  Diameter: 5.3 mm.  Liver:  Increased echogenicity in the liver consistent with fatty infiltration.  IVC:  No abnormality visualized.  Pancreas:  Visualized portion unremarkable.  Spleen:  Size and appearance within normal limits.  Right Kidney:  Length: Left 0.1 cm. Echogenic consistent with chronic medical renal disease.  Left Kidney:  Length: 10.3 cm. Echogenic consistent chronic medical renal disease.  Abdominal aorta:  No aneurysm visualized.  Other findings:  None.  IMPRESSION: 1. Gallstones.  No evidence of cholecystitis. 2. Fatty infiltration liver. 3. Increased echogenicity noted both kidneys, this suggests chronic medical renal disease.  Electronically Signed   By: Marcello Moores  Register   On: 05/10/2013 12:19   Dg Chest Port 1 View  05/10/2013   CLINICAL DATA:  Rule out pneumothorax following catheter placement.  EXAM: PORTABLE CHEST - 1 VIEW  COMPARISON:  05/09/2013  FINDINGS: There has been interval placement of a right jugular approach dialysis catheter with tips overlying the lower SVC. No pneumothorax is identified. Lung volumes are mildly diminished compared to the prior exam with persistent pulmonary vascular congestion. Cardiac silhouette remains enlarged.  IMPRESSION: 1. Interval placement of right jugular dialysis catheter as above. No pneumothorax identified. 2. Persistent cardiomegaly and pulmonary vascular congestion.   Electronically Signed   By: Logan Bores   On: 05/10/2013 17:02   Dg Chest Port 1 View  05/09/2013    CLINICAL DATA:  Left upper chest pain. Shortness of breath. Cough and nausea.  EXAM: PORTABLE CHEST - 1 VIEW  COMPARISON:  Two-view chest 04/11/2013.  FINDINGS: The heart is enlarged. Mild pulmonary vascular congestion is evident. No focal airspace consolidation is present. The visualized soft tissues and bony thorax are unremarkable.  IMPRESSION: 1. Stable cardiomegaly with slight progression of pulmonary vascular congestion suggesting early congestive heart failure. 2. No focal airspace consolidation.   Electronically Signed   By: Lawrence Santiago M.D.   On: 05/09/2013 11:51   Dg Fluoro Guide Cv Line-no Report  05/10/2013   CLINICAL DATA: diatek placement   FLOURO GUIDE CV LINE  Fluoroscopy was utilized by the requesting physician.  No radiographic  interpretation.     PHYSICAL EXAM   patient is stable and the images are now being completed in radiology. He is oriented to person time and place. Lungs are clear. Respiratory effort is nonlabored. Cardiac exam reveals S1 and S2.   TELEMETRY:  I have reviewed telemetry today May 13, 2013. Her sinus rhythm.  ASSESSMENT AND PLAN:    CKD (chronic kidney disease), stage V      The patient is now on dialysis.      Acute on chronic combined systolic and diastolic CHF (congestive heart failure)     The patient has left ventricular systolic dysfunction. There is also severe left ventricular hypertrophy. Dialysis will certainly help with his volume control.      Hypertensive cardiovascular disease /  Cardiomyopathy     The patient has severe left ventricular hypertrophy. The clinical picture does not fit amyloid. The patient has had evaluation of light chains. This was negative. As Dr.       Harrington Challenger as indicated, this does not completely rule out amyloid. At this time there is no plan for further aggressive evaluation of this issue. It is felt that hypertensive disease      Is the most likely basis of his problem. Nuclear stress study is being done today  to be sure that there is not any obvious evidence of ischemic heart disease causing his  Left ventricular dysfunction.  Rule out coronary artery disease   I have personally reviewed the nuclear stress study today. So far it has not been dictated because of problems with the dictation system. There is evidence of some inferolateral ischemia. It is not a high risk scan. However it is not normal. Because this patient is young and we do not have a definitive diagnosis concerning his left ventricular  dysfunction and because his nuclear scan is not completely normal, I feel that it would be appropriate at some time to proceed with diagnostic cardiac catheterization. I feel this  is not urgent. Decisions about the timing for this can be based on his other ongoing workup.  Dola Argyle 05/13/2013 9:23 AM

## 2013-05-13 NOTE — Progress Notes (Signed)
Pt given heart failure booklet, handout on hypertension, and renal diet. Heart failure booklet explained to patient. Patient states he will read handouts later. Dietitian consult placed for patient to go over renal diet. Will continue to offer education. Shelby Mattocks, RN

## 2013-05-13 NOTE — Progress Notes (Signed)
Patient ID: Joshua [name], male   DOB: Aug 13, 1980, 32 y.o.   MRN: [id number]  [facility] Progress Note    Subjective:   No new complaints   Objective:   BP 213/116  Pulse 94  Temp(Src) 98.6 F (37 C) (Oral)  Resp 18  Ht 5' 11.5" (1.816 m)  Wt 114.397 kg (252 lb 3.2 oz)  BMI 34.69 kg/m2  SpO2 94%  Intake/Output: I/O last 3 completed shifts: In: [place] [P.O.:1660] Out: [id number] [Urine:1300; Other:5224]   Intake/Output this shift:  Total I/O In: 0  Out: 450 [Urine:450] Weight change: -2.7 kg (-5 lb 15.2 oz)  Physical Exam: Gen:WD obese AAM in NAD CVS:no rub Resp:cta [id number] Ext:no edema, LUAVF +T/B  Labs: BMET  Recent Labs Lab 05/09/13 1122 05/09/13 1725 05/09/13 2135 05/10/13 0845 05/10/13 1020 05/11/13 0440 05/13/13 0435  NA 131*  --   --  135  --  138 139  K 5.6*  --   --  4.5  --  4.2 3.4*  CL 94*  --   --  100  --  105 100  CO2 12*  --   --  15*  --  17* 29  GLUCOSE 128*  --   --  125*  --  95 104*  BUN 85*  --   --  86*  --  82* 28*  CREATININE 7.03*  --   --  6.64*  --  6.26* 3.31*  ALBUMIN 3.9 3.7  --   --   --  3.0* 3.0*  CALCIUM 9.3  --   --  8.2*  --  7.9* 7.9*  PHOS  --   --  5.3*  --  5.0* 5.8*  --    CBC  Recent Labs Lab 05/09/13 1122 05/10/13 1020 05/11/13 0421 05/13/13 0435  WBC 16.6* 14.8* 11.5* 8.8  NEUTROABS 13.4*  --   --   --   HGB 11.0* 10.3* 9.2* 9.2*  HCT 32.7* 30.4* 27.4* 28.2*  MCV 70.6* 71.0* 71.2* 70.9*  PLT 307 214 193 139*    @IMGRELPRIORS @ Medications:    . antiseptic oral rinse  15 mL Mouth Rinse BID  . aspirin  325 mg Oral Daily  . cloNIDine  0.2 mg Oral TID  . hydrALAZINE  50 mg Oral Q8H  . insulin aspart  0-15 Units Subcutaneous TID WC  . insulin aspart  0-5 Units Subcutaneous QHS  . minoxidil  5 mg Oral BID     Assessment/ Plan:   1. Malignant HTN- now on po meds.  Still not well controlled. 2. New ESRD: secondary to poorly controlled HTN. Tolerated HD well yesterday.  Set up for  outpt HD at [facility] MWF schedule. Will proceed with HD again tomorrow and can be discharged once BP stabilized and f/u with outpt  HD on Monday 05/16/13. 3. Anemia: dropping. Will start ESA 4. Iron deficiency- will dose Feraheme IV and follow 5. CKD-MBD: follow Ca/Phos/iPTH and initiate vit D therapy dose pending iPTH level 6. Nutrition:follow 7. Hypertension:as above. Improving with labetalol and HD 8. Vascular access: LUE AVF placed last month by Dr. Bridgett Larsson not yet mature for use, s/p PC placed 12/16 by [facility].  9. HA/Abd pain- treat BP. Improved 10. Dispo- cont with BP control and hopeful d/c to home tomorrow after HD 11.   Kahmari Herard A 05/13/2013, 1:31 PM

## 2013-05-14 LAB — CBC
Hemoglobin: 10.6 g/dL — ABNORMAL LOW (ref 13.0–17.0)
MCH: 23.6 pg — ABNORMAL LOW (ref 26.0–34.0)
MCHC: 32.5 g/dL (ref 30.0–36.0)
MCV: 72.6 fL — ABNORMAL LOW (ref 78.0–100.0)
RBC: 4.49 MIL/uL (ref 4.22–5.81)
WBC: 8.4 10*3/uL (ref 4.0–10.5)

## 2013-05-14 LAB — GLUCOSE, CAPILLARY
Glucose-Capillary: 106 mg/dL — ABNORMAL HIGH (ref 70–99)
Glucose-Capillary: 97 mg/dL (ref 70–99)

## 2013-05-14 LAB — BASIC METABOLIC PANEL
CO2: 28 mEq/L (ref 19–32)
Calcium: 8.3 mg/dL — ABNORMAL LOW (ref 8.4–10.5)
Chloride: 98 mEq/L (ref 96–112)
Creatinine, Ser: 2.9 mg/dL — ABNORMAL HIGH (ref 0.50–1.35)
GFR calc Af Amer: 32 mL/min — ABNORMAL LOW (ref >90)
GFR calc non Af Amer: 27 mL/min — ABNORMAL LOW (ref >90)
Glucose, Bld: 101 mg/dL — ABNORMAL HIGH (ref 70–99)
Sodium: 135 mEq/L (ref 135–145)

## 2013-05-14 MED ORDER — CARVEDILOL 12.5 MG PO TABS
12.5000 mg | ORAL_TABLET | Freq: Two times a day (BID) | ORAL | Status: DC
  Administered 2013-05-14 – 2013-05-17 (×6): 12.5 mg via ORAL
  Filled 2013-05-14 (×8): qty 1

## 2013-05-14 MED ORDER — HEPARIN SODIUM (PORCINE) 1000 UNIT/ML DIALYSIS
20.0000 [IU]/kg | INTRAMUSCULAR | Status: DC | PRN
  Administered 2013-05-14: 2300 [IU] via INTRAVENOUS_CENTRAL
  Filled 2013-05-14: qty 3

## 2013-05-14 NOTE — Progress Notes (Signed)
[facility]                                                                                Patient Demographics  Joshua [name], is a 32 y.o. male, DOB - 1981/02/07, [id number]  Admit date - 05/09/2013   Admitting Physician Doree Fudge, MD  Outpatient Primary MD for the patient is Provider Not In System  LOS - 5   Chief Complaint  Patient presents with  . Nausea  . Hypertension        Assessment & Plan  Active Problems:   ? Diabetes   CKD (chronic kidney disease), stage V   Anemia in chronic kidney disease   Transaminitis   Acute on chronic combined systolic and diastolic CHF (congestive heart failure)   Noncompliance   Cholelithiases   Hypertensive emergency   Hypertensive cardiovascular disease  CKD (chronic kidney disease), stage V  -Nephrology consulted. -Hemodialysis has resumed and then arranged for as an outpatient by nephrology -Diatek catheter placed this admission; had a left upper extremity AV fistula placed in November but per vascular team not mature for use yet   Acute on chronic combined systolic and diastolic CHF (congestive heart failure)  -due to need for impending HD and poorly controlled HTN  -EF on ECHO Aug 2013: 30% with diffuse hypokinesis and severe LVH likely HTN etiology  -has never had ischemic evaluation -Stress/myoview scheduled for today -cardiology consulted, feels patient should have cath  Hypertensive emergency  -due to volume overload and uncontrolled HTN  -Patient did not receive antihypertensive medications prior to hemodialysis today. -Required labetalol infusion initially   ? Diabetes  -Hgb A1c was 5.6 in September OFF meds   Anemia of chronic kidney disease  -stable, H/H 9.2/28.2 -No active signs of bleeding, will continue to monitor CBC  Transaminitis/Cholelithiases  -elevated LFTs likely due to passive hepato-congestion from heart failure -has cholelithiasis but [place] this admit without cholecystitis   -trending downward, will continue to monitor    Noncompliance  -Counseled, hemodialysis as an outpatient has been arranged for by nephrology  Code Status: Full  Family Communication: None at this time.  Patient does not wish for [place] to contact anyone.  Disposition Plan: Admitted   Procedures  Stress Test Ultrasound guided insertion of Diatek catheter 12/16  Consults   Cardiology Nephrology PCCM  DVT Prophylaxis  SCDs  Lab Results  Component Value Date   PLT 139* 05/13/2013    Medications  Scheduled Meds: . antiseptic oral rinse  15 mL Mouth Rinse BID  . aspirin  325 mg Oral Daily  . cloNIDine  0.2 mg Oral TID  . hydrALAZINE  50 mg Oral Q8H  . insulin aspart  0-15 Units Subcutaneous TID WC  . insulin aspart  0-5 Units Subcutaneous QHS  . minoxidil  5 mg Oral BID   Continuous Infusions: . labetalol (NORMODYNE) infusion Stopped (05/11/13 1100)   PRN Meds:.fentaNYL  Antibiotics   Anti-infectives   None       Time Spent in minutes   20 minutes   Genna Casimir D.O. on 05/14/2013 at 12:54 PM  Between 7am to 7pm - Pager - [phone]  After 7pm go to [email or web address] -  password [email or web address]  And look for the night coverage person covering for me after hours  [facility] Office  [phone]    Subjective:   Joshua [name] seen and examined today in dialysis.   Patient has no complaints today. However seems to be agitated with the situation that he is experiencing currently.  Objective:   Filed Vitals:   05/14/13 0930 05/14/13 0939 05/14/13 1000 05/14/13 1035  BP: 195/126 191/120 186/116 194/112  Pulse: 83 84 83 80  Temp:    98.5 F (36.9 C)  TempSrc:    Oral  Resp:    16  Height:      Weight:      SpO2:        Wt Readings from Last 3 Encounters:  05/14/13 114.7 kg (252 lb 13.9 oz)  05/14/13 114.7 kg (252 lb 13.9 oz)  04/19/13 114.4 kg (252 lb 3.3 oz)     Intake/Output Summary (Last 24 hours) at 05/14/13 1254 Last data filed at  05/14/13 1035  Gross per 24 hour  Intake   1050 ml  Output   2250 ml  Net  -1200 ml    Exam  General: Well developed, well nourished, NAD, appears stated age  HEENT: NCAT, PERRLA, mucous membranes moist.   Neck: Supple, no JVD, no masses  Cardiovascular: S1 S2 auscultated, no rubs, murmurs or gallops. Regular rate and rhythm.  Respiratory: Clear to auscultation bilaterally with equal chest rise  Abdomen: Soft, nontender, nondistended, + bowel sounds  Extremities: warm dry without cyanosis clubbing or edema  Neuro: AAOx3, cranial nerves grossly intact.   Skin: Without rashes exudates or nodules  Psych: Normal affect and demeanor with intact judgement and insight  Data Review   Micro Results Recent Results (from the past 240 hour(s))  MRSA PCR SCREENING     Status: None   Collection Time    05/09/13  4:00 PM      Result Value Range Status   MRSA by PCR NEGATIVE  NEGATIVE Final   Comment:            The GeneXpert MRSA Assay (FDA     approved for NASAL specimens     only), is one component of a     comprehensive MRSA colonization     surveillance program. It is not     intended to diagnose MRSA     infection nor to guide or     monitor treatment for     MRSA infections.    Radiology Reports US Abdomen Port  05/10/2013   CLINICAL DATA:  Diabetes. Hypertension. Renal failure. Morbid obesity .  EXAM: ULTRASOUND PORTABLE ABDOMEN  COMPARISON:  04/12/2013.  FINDINGS: Gallbladder:  Multiple mobile gallstones again noted. Gallbladder wall thickness normal at 1.8 mm. Negative Murphy sign. No pericholecystic fluid collection.  Common bile duct:  Diameter: 5.3 mm.  Liver:  Increased echogenicity in the liver consistent with fatty infiltration.  IVC:  No abnormality visualized.  Pancreas:  Visualized portion unremarkable.  Spleen:  Size and appearance within normal limits.  Right Kidney:  Length: Left 0.1 cm. Echogenic consistent with chronic medical renal disease.  Left Kidney:   Length: 10.3 cm. Echogenic consistent chronic medical renal disease.  Abdominal aorta:  No aneurysm visualized.  Other findings:  None.  IMPRESSION: 1. Gallstones.  No evidence of cholecystitis. 2. Fatty infiltration liver. 3. Increased echogenicity noted both kidneys, this suggests chronic medical renal disease.   Electronically Signed   By: Marcello Moores  Register  On: 05/10/2013 12:19   Dg Chest Port 1 View  05/10/2013   CLINICAL DATA:  Rule out pneumothorax following catheter placement.  EXAM: PORTABLE CHEST - 1 VIEW  COMPARISON:  05/09/2013  FINDINGS: There has been interval placement of a right jugular approach dialysis catheter with tips overlying the lower SVC. No pneumothorax is identified. Lung volumes are mildly diminished compared to the prior exam with persistent pulmonary vascular congestion. Cardiac silhouette remains enlarged.  IMPRESSION: 1. Interval placement of right jugular dialysis catheter as above. No pneumothorax identified. 2. Persistent cardiomegaly and pulmonary vascular congestion.   Electronically Signed   By: Logan Bores   On: 05/10/2013 17:02   Dg Chest Port 1 View  05/09/2013   CLINICAL DATA:  Left upper chest pain. Shortness of breath. Cough and nausea.  EXAM: PORTABLE CHEST - 1 VIEW  COMPARISON:  Two-view chest 04/11/2013.  FINDINGS: The heart is enlarged. Mild pulmonary vascular congestion is evident. No focal airspace consolidation is present. The visualized soft tissues and bony thorax are unremarkable.  IMPRESSION: 1. Stable cardiomegaly with slight progression of pulmonary vascular congestion suggesting early congestive heart failure. 2. No focal airspace consolidation.   Electronically Signed   By: Lawrence Santiago M.D.   On: 05/09/2013 11:51   Dg Fluoro Guide Cv Line-no Report  05/10/2013   CLINICAL DATA: diatek placement   FLOURO GUIDE CV LINE  Fluoroscopy was utilized by the requesting physician.  No radiographic  interpretation.     CBC  Recent Labs Lab  05/09/13 1122 05/10/13 1020 05/11/13 0421 05/13/13 0435  WBC 16.6* 14.8* 11.5* 8.8  HGB 11.0* 10.3* 9.2* 9.2*  HCT 32.7* 30.4* 27.4* 28.2*  PLT 307 214 193 139*  MCV 70.6* 71.0* 71.2* 70.9*  MCH 23.8* 24.1* 23.9* 23.1*  MCHC 33.6 33.9 33.6 32.6  RDW 23.2* 23.0* 23.2* 23.0*  LYMPHSABS 1.5  --   --   --   MONOABS 1.5*  --   --   --   EOSABS 0.0  --   --   --   BASOSABS 0.2*  --   --   --     Chemistries   Recent Labs Lab 05/09/13 1122 05/09/13 1725 05/10/13 0845 05/11/13 0440 05/13/13 0435  NA 131*  --  135 138 139  K 5.6*  --  4.5 4.2 3.4*  CL 94*  --  100 105 100  CO2 12*  --  15* 17* 29  GLUCOSE 128*  --  125* 95 104*  BUN 85*  --  86* 82* 28*  CREATININE 7.03*  --  6.64* 6.26* 3.31*  CALCIUM 9.3  --  8.2* 7.9* 7.9*  AST 259* 294*  --   --  28  ALT 470* 500*  --   --  164*  ALKPHOS 205* 201*  --   --  137*  BILITOT 2.6* 2.6*  --   --  0.8   ------------------------------------------------------------------------------------------------------------------ estimated creatinine clearance is 42 ml/min (by C-G formula based on Cr of 3.31). ------------------------------------------------------------------------------------------------------------------ No results found for this basename: HGBA1C,  in the last 72 hours ------------------------------------------------------------------------------------------------------------------ No results found for this basename: CHOL, HDL, LDLCALC, TRIG, CHOLHDL, LDLDIRECT,  in the last 72 hours ------------------------------------------------------------------------------------------------------------------ No results found for this basename: TSH, T4TOTAL, FREET3, T3FREE, THYROIDAB,  in the last 72 hours ------------------------------------------------------------------------------------------------------------------ No results found for this basename: VITAMINB12, FOLATE, FERRITIN, TIBC, IRON, RETICCTPCT,  in the last 72  hours  Coagulation profile No results found for this basename: INR, PROTIME,  in the last 168 hours  No results found for this basename: DDIMER,  in the last 72 hours  Cardiac Enzymes  Recent Labs Lab 05/09/13 1122  TROPONINI <0.30   ------------------------------------------------------------------------------------------------------------------ No components found with this basename: POCBNP,

## 2013-05-14 NOTE — Progress Notes (Signed)
Patient ID: Joshua [name], male   DOB: March 11, 1981, 32 y.o.   MRN: [id number]     Subjective:    Patient reports breathing is improved  Objective:   Temp:  [98 F (36.7 C)-98.5 F (36.9 C)] 98.5 F (36.9 C) (12/20 1035) Pulse Rate:  [80-102] 80 (12/20 1035) Resp:  [16-18] 16 (12/20 1035) BP: (113-210)/(46-126) 194/112 mmHg (12/20 1035) SpO2:  [95 %-100 %] 100 % (12/20 0700) Weight:  [252 lb 6.4 oz (114.488 kg)-252 lb 13.9 oz (114.7 kg)] 252 lb 13.9 oz (114.7 kg) (12/20 0700) Last BM Date: 05/13/13  Filed Weights   05/13/13 0610 05/14/13 0520 05/14/13 0700  Weight: 252 lb 3.2 oz (114.397 kg) 252 lb 6.4 oz (114.488 kg) 252 lb 13.9 oz (114.7 kg)    Intake/Output Summary (Last 24 hours) at 05/14/13 1301 Last data filed at 05/14/13 1035  Gross per 24 hour  Intake   1050 ml  Output   2250 ml  Net  -1200 ml    Exam:  General:NAD  Resp:faint crackles in bases  Cardiac: RRR, no m/r/g, no JVD  [id number], NT, ND  MSK: extremities warm  Neuro: no focal deficits  Lab Results:  Basic Metabolic Panel:  Recent Labs Lab 05/10/13 0845 05/11/13 0440 05/13/13 0435  NA 135 138 139  K 4.5 4.2 3.4*  CL 100 105 100  CO2 15* 17* 29  GLUCOSE 125* 95 104*  BUN 86* 82* 28*  CREATININE 6.64* 6.26* 3.31*  CALCIUM 8.2* 7.9* 7.9*    Liver Function Tests:  Recent Labs Lab 05/09/13 1122 05/09/13 1725 05/11/13 0440 05/13/13 0435  AST 259* 294*  --  28  ALT 470* 500*  --  164*  ALKPHOS 205* 201*  --  137*  BILITOT 2.6* 2.6*  --  0.8  PROT 8.2 7.7  --  6.6  ALBUMIN 3.9 3.7 3.0* 3.0*    CBC:  Recent Labs Lab 05/10/13 1020 05/11/13 0421 05/13/13 0435  WBC 14.8* 11.5* 8.8  HGB 10.3* 9.2* 9.2*  HCT 30.4* 27.4* 28.2*  MCV 71.0* 71.2* 70.9*  PLT 214 193 139*    Cardiac Enzymes:  Recent Labs Lab 05/09/13 1122  TROPONINI <0.30    BNP:  Recent Labs  01/06/13 1847 01/24/13 0720 05/09/13 1122  PROBNP 61864.0* >70000.0* >70000.0*    Coagulation: No results  found for this basename: INR,  in the last 168 hours  ECG:   Medications:   Scheduled Medications: . antiseptic oral rinse  15 mL Mouth Rinse BID  . aspirin  325 mg Oral Daily  . cloNIDine  0.2 mg Oral TID  . hydrALAZINE  50 mg Oral Q8H  . insulin aspart  0-15 Units Subcutaneous TID WC  . insulin aspart  0-5 Units Subcutaneous QHS  . minoxidil  5 mg Oral BID     Infusions: . labetalol (NORMODYNE) infusion Stopped (05/11/13 1100)     PRN Medications:  fentaNYL     Assessment/Plan   32 yo male with ESRD, HTN, OSA and chroinc systolic heart failure admitted for hypertensive urgency.   1. Acute on chronic systolic heart failure - volume status improved with HD - throught to be hypertensive cardiomyopathy, previous biopsy not specific for amyloid. He had not had a prior ischemic evaluation, the results are pending from a stress MPI done yesterday. There are issues with the dictation system, per Dr Ron Parker review of the study there is some evidence of inferolateral ischemia, however still low risk exam.  - will need  diagnostic cath at some point, consider Monday if patient remains in hospital or can be arranged as an outpatient.  - his current medication regimen is somewhat unclear to me, he is not on a beta blocker or ACE-I.  - will start coreg 12.5 mg bid, consider ACE-I now that he is established on dialysis.       Carlyle Dolly, M.D., F.A.C.C.

## 2013-05-14 NOTE — Procedures (Signed)
Patient was seen on dialysis and the procedure was supervised. BFR 400 Via RIJ PC BP is 197/122.  Patient appears to be tolerating treatment well.  BP meds were on hold prior to HD.  Will f/u outpt HD at [facility] as previously arranged.  Pt has information.

## 2013-05-15 DIAGNOSIS — N185 Chronic kidney disease, stage 5: Secondary | ICD-10-CM

## 2013-05-15 LAB — BASIC METABOLIC PANEL
BUN: 25 mg/dL — ABNORMAL HIGH (ref 6–23)
CO2: 24 mEq/L (ref 19–32)
Calcium: 8.7 mg/dL (ref 8.4–10.5)
Chloride: 99 mEq/L (ref 96–112)
Creatinine, Ser: 3.57 mg/dL — ABNORMAL HIGH (ref 0.50–1.35)
GFR calc Af Amer: 25 mL/min — ABNORMAL LOW (ref >90)
GFR calc non Af Amer: 21 mL/min — ABNORMAL LOW (ref >90)
Glucose, Bld: 96 mg/dL (ref 70–99)
Potassium: 4.2 mEq/L (ref 3.5–5.1)
Sodium: 135 mEq/L (ref 135–145)

## 2013-05-15 LAB — GLUCOSE, CAPILLARY
Glucose-Capillary: 115 mg/dL — ABNORMAL HIGH (ref 70–99)
Glucose-Capillary: 104 mg/dL — ABNORMAL HIGH (ref 70–99)

## 2013-05-15 LAB — CBC
Hemoglobin: 10.1 g/dL — ABNORMAL LOW (ref 13.0–17.0)
MCHC: 32.3 g/dL (ref 30.0–36.0)
Platelets: 126 10*3/uL — ABNORMAL LOW (ref 150–400)
RDW: 22.5 % — ABNORMAL HIGH (ref 11.5–15.5)
WBC: 8 10*3/uL (ref 4.0–10.5)

## 2013-05-15 MED ORDER — DARBEPOETIN ALFA-POLYSORBATE 60 MCG/0.3ML IJ SOLN
60.0000 ug | INTRAMUSCULAR | Status: DC
  Filled 2013-05-15: qty 0.3

## 2013-05-15 MED ORDER — LISINOPRIL 10 MG PO TABS
10.0000 mg | ORAL_TABLET | Freq: Every day | ORAL | Status: DC
  Administered 2013-05-15 – 2013-05-16 (×2): 10 mg via ORAL
  Filled 2013-05-15 (×2): qty 1

## 2013-05-15 NOTE — Progress Notes (Signed)
[facility]                                                                                Patient Demographics  Joshua [name], is a 32 y.o. male, DOB - 1981/04/02, [id number]  Admit date - 05/09/2013   Admitting Physician Doree Fudge, MD  Outpatient Primary MD for the patient is Provider Not In System  LOS - 6   Chief Complaint  Patient presents with  . Nausea  . Hypertension        Assessment & Plan  Active Problems:   ? Diabetes   CKD (chronic kidney disease), stage V   Anemia in chronic kidney disease   Transaminitis   Acute on chronic combined systolic and diastolic CHF (congestive heart failure)   Noncompliance   Cholelithiases   Hypertensive emergency   Hypertensive cardiovascular disease  CKD (chronic kidney disease), stage V  -Nephrology consulted. -Hemodialysis has resumed and then arranged for as an outpatient by nephrology -Diatek catheter placed this admission; had a left upper extremity AV fistula placed in November but per vascular team not mature for use yet   Acute on chronic combined systolic and diastolic CHF (congestive heart failure)  -due to need for impending HD and poorly controlled HTN  -EF on ECHO Aug 2013: 30% with diffuse hypokinesis and severe LVH likely HTN etiology  -has never had ischemic evaluation -Stress/myoview found to be abnormal, pending actual report -cardiology consulted, feels patient should have cath -Plan on cath 12/22  Hypertensive emergency  -due to volume overload and uncontrolled HTN  -Required labetalol infusion initially  -Coreg and lisinopril started by cardiology, will eventually titrate and wean off of clonidine and minoxidil  ? Diabetes  -Hgb A1c was 5.6 in September OFF meds   Anemia of chronic kidney disease  -stable, H/H 10.1/31.3 -No active signs of bleeding, will continue to monitor CBC  Transaminitis/Cholelithiases  -elevated LFTs likely due to passive hepato-congestion from  heart failure -has cholelithiasis but [place] this admit without cholecystitis  -trending downward, will continue to monitor    Noncompliance  -Counseled, hemodialysis as an outpatient has been arranged for by nephrology  Code Status: Full  Family Communication: None at this time.  Patient does not wish for [place] to contact anyone.  Disposition Plan: Admitted   Procedures  Stress Test Ultrasound guided insertion of Diatek catheter 12/16  Consults   Cardiology Nephrology PCCM  DVT Prophylaxis  SCDs  Lab Results  Component Value Date   PLT 126* 05/15/2013    Medications  Scheduled Meds: . antiseptic oral rinse  15 mL Mouth Rinse BID  . aspirin  325 mg Oral Daily  . carvedilol  12.5 mg Oral BID WC  . cloNIDine  0.2 mg Oral TID  . hydrALAZINE  50 mg Oral Q8H  . insulin aspart  0-15 Units Subcutaneous TID WC  . insulin aspart  0-5 Units Subcutaneous QHS  . lisinopril  10 mg Oral Daily  . minoxidil  5 mg Oral BID   Continuous Infusions: . labetalol (NORMODYNE) infusion Stopped (05/11/13 1100)   PRN Meds:.fentaNYL  Antibiotics   Anti-infectives   None       Time Spent  in minutes   20 minutes   Reily Treloar D.O. on 05/15/2013 at 10:13 AM  Between 7am to 7pm - Pager - [phone]  After 7pm go to [email or web address] - password [email or web address]  And look for the night coverage person covering for me after hours  [facility] Office  [phone]    Subjective:   Joshua [name] seen and examined today in dialysis.   Patient has no complaints today. Patient does agree that he needs cath and is willing to have it done tomorrow.  Objective:   Filed Vitals:   05/14/13 2236 05/15/13 0054 05/15/13 0411 05/15/13 1008  BP: 182/92 170/92 148/98 183/111  Pulse: 85 81 76 87  Temp:   97.8 F (36.6 C)   TempSrc:   Oral   Resp:   20   Height:      Weight:   112.5 kg (248 lb 0.3 oz)   SpO2:   100%     Wt Readings from Last 3 Encounters:  05/15/13 112.5 kg (248 lb  0.3 oz)  05/15/13 112.5 kg (248 lb 0.3 oz)  04/19/13 114.4 kg (252 lb 3.3 oz)     Intake/Output Summary (Last 24 hours) at 05/15/13 1013 Last data filed at 05/15/13 [id number]  Gross per 24 hour  Intake    820 ml  Output   3100 ml  Net  -2280 ml    Exam  General: Well developed, well nourished, NAD, appears stated age  HEENT: NCAT, PERRLA, mucous membranes moist.   Neck: Supple, no JVD, no masses  Cardiovascular: S1 S2 auscultated, no rubs, murmurs or gallops. Regular rate and rhythm.  Respiratory: Clear to auscultation bilaterally with equal chest rise  Abdomen: Soft, nontender, nondistended, + bowel sounds  Extremities: warm dry without cyanosis clubbing or edema  Neuro: AAOx3, cranial nerves grossly intact.   Skin: Without rashes exudates or nodules  Psych: Normal affect and demeanor with intact judgement and insight  Data Review   Micro Results Recent Results (from the past 240 hour(s))  MRSA PCR SCREENING     Status: None   Collection Time    05/09/13  4:00 PM      Result Value Range Status   MRSA by PCR NEGATIVE  NEGATIVE Final   Comment:            The GeneXpert MRSA Assay (FDA     approved for NASAL specimens     only), is one component of a     comprehensive MRSA colonization     surveillance program. It is not     intended to diagnose MRSA     infection nor to guide or     monitor treatment for     MRSA infections.    Radiology Reports US Abdomen Port  05/10/2013   CLINICAL DATA:  Diabetes. Hypertension. Renal failure. Morbid obesity .  EXAM: ULTRASOUND PORTABLE ABDOMEN  COMPARISON:  04/12/2013.  FINDINGS: Gallbladder:  Multiple mobile gallstones again noted. Gallbladder wall thickness normal at 1.8 mm. Negative Murphy sign. No pericholecystic fluid collection.  Common bile duct:  Diameter: 5.3 mm.  Liver:  Increased echogenicity in the liver consistent with fatty infiltration.  IVC:  No abnormality visualized.  Pancreas:  Visualized portion  unremarkable.  Spleen:  Size and appearance within normal limits.  Right Kidney:  Length: Left 0.1 cm. Echogenic consistent with chronic medical renal disease.  Left Kidney:  Length: 10.3 cm. Echogenic consistent chronic medical renal disease.  Abdominal aorta:  No aneurysm visualized.  Other findings:  None.  IMPRESSION: 1. Gallstones.  No evidence of cholecystitis. 2. Fatty infiltration liver. 3. Increased echogenicity noted both kidneys, this suggests chronic medical renal disease.   Electronically Signed   By: Marcello Moores  Register   On: 05/10/2013 12:19   Dg Chest Port 1 View  05/10/2013   CLINICAL DATA:  Rule out pneumothorax following catheter placement.  EXAM: PORTABLE CHEST - 1 VIEW  COMPARISON:  05/09/2013  FINDINGS: There has been interval placement of a right jugular approach dialysis catheter with tips overlying the lower SVC. No pneumothorax is identified. Lung volumes are mildly diminished compared to the prior exam with persistent pulmonary vascular congestion. Cardiac silhouette remains enlarged.  IMPRESSION: 1. Interval placement of right jugular dialysis catheter as above. No pneumothorax identified. 2. Persistent cardiomegaly and pulmonary vascular congestion.   Electronically Signed   By: Logan Bores   On: 05/10/2013 17:02   Dg Chest Port 1 View  05/09/2013   CLINICAL DATA:  Left upper chest pain. Shortness of breath. Cough and nausea.  EXAM: PORTABLE CHEST - 1 VIEW  COMPARISON:  Two-view chest 04/11/2013.  FINDINGS: The heart is enlarged. Mild pulmonary vascular congestion is evident. No focal airspace consolidation is present. The visualized soft tissues and bony thorax are unremarkable.  IMPRESSION: 1. Stable cardiomegaly with slight progression of pulmonary vascular congestion suggesting early congestive heart failure. 2. No focal airspace consolidation.   Electronically Signed   By: Lawrence Santiago M.D.   On: 05/09/2013 11:51   Dg Fluoro Guide Cv Line-no Report  05/10/2013   CLINICAL  DATA: diatek placement   FLOURO GUIDE CV LINE  Fluoroscopy was utilized by the requesting physician.  No radiographic  interpretation.     CBC  Recent Labs Lab 05/09/13 1122 05/10/13 1020 05/11/13 0421 05/13/13 0435 05/14/13 1622 05/15/13 0600  WBC 16.6* 14.8* 11.5* 8.8 8.4 8.0  HGB 11.0* 10.3* 9.2* 9.2* 10.6* 10.1*  HCT 32.7* 30.4* 27.4* 28.2* 32.6* 31.3*  PLT 307 214 193 139* 127* 126*  MCV 70.6* 71.0* 71.2* 70.9* 72.6* 72.6*  MCH 23.8* 24.1* 23.9* 23.1* 23.6* 23.4*  MCHC 33.6 33.9 33.6 32.6 32.5 32.3  RDW 23.2* 23.0* 23.2* 23.0* 22.9* 22.5*  LYMPHSABS 1.5  --   --   --   --   --   MONOABS 1.5*  --   --   --   --   --   EOSABS 0.0  --   --   --   --   --   BASOSABS 0.2*  --   --   --   --   --     Chemistries   Recent Labs Lab 05/09/13 1122 05/09/13 1725 05/10/13 0845 05/11/13 0440 05/13/13 0435 05/14/13 1622 05/15/13 0600  NA 131*  --  135 138 139 135 135  K 5.6*  --  4.5 4.2 3.4* 4.1 4.2  CL 94*  --  100 105 100 98 99  CO2 12*  --  15* 17* 29 28 24   GLUCOSE 128*  --  125* 95 104* 101* 96  BUN 85*  --  86* 82* 28* 19 25*  CREATININE 7.03*  --  6.64* 6.26* 3.31* 2.90* 3.57*  CALCIUM 9.3  --  8.2* 7.9* 7.9* 8.3* 8.7  AST 259* 294*  --   --  28  --   --   ALT 470* 500*  --   --  164*  --   --   ALKPHOS 205* 201*  --   --  137*  --   --   BILITOT 2.6* 2.6*  --   --  0.8  --   --    ------------------------------------------------------------------------------------------------------------------ estimated creatinine clearance is 38.5 ml/min (by C-G formula based on Cr of 3.57). ------------------------------------------------------------------------------------------------------------------ No results found for this basename: HGBA1C,  in the last 72 hours ------------------------------------------------------------------------------------------------------------------ No results found for this basename: CHOL, HDL, LDLCALC, TRIG, CHOLHDL, LDLDIRECT,  in the last  72 hours ------------------------------------------------------------------------------------------------------------------ No results found for this basename: TSH, T4TOTAL, FREET3, T3FREE, THYROIDAB,  in the last 72 hours ------------------------------------------------------------------------------------------------------------------ No results found for this basename: VITAMINB12, FOLATE, FERRITIN, TIBC, IRON, RETICCTPCT,  in the last 72 hours  Coagulation profile No results found for this basename: INR, PROTIME,  in the last 168 hours  No results found for this basename: DDIMER,  in the last 72 hours  Cardiac Enzymes  Recent Labs Lab 05/09/13 1122  TROPONINI <0.30   ------------------------------------------------------------------------------------------------------------------ No components found with this basename: POCBNP,

## 2013-05-15 NOTE — Progress Notes (Addendum)
Patient ID: Joshua [name], male   DOB: 1981-05-22, 32 y.o.   MRN: [id number]    Primary cardiologist:  Subjective:   No complaints this morning   Objective:   Temp:  [97.8 F (36.6 C)-98.5 F (36.9 C)] 97.8 F (36.6 C) (12/21 0411) Pulse Rate:  [76-94] 76 (12/21 0411) Resp:  [16-20] 20 (12/21 0411) BP: (126-210)/(78-126) 148/98 mmHg (12/21 0411) SpO2:  [100 %] 100 % (12/21 0411) Weight:  [248 lb 0.3 oz (112.5 kg)] 248 lb 0.3 oz (112.5 kg) (12/21 0411) Last BM Date: 05/13/13  Filed Weights   05/14/13 0520 05/14/13 0700 05/15/13 0411  Weight: 252 lb 6.4 oz (114.488 kg) 252 lb 13.9 oz (114.7 kg) 248 lb 0.3 oz (112.5 kg)    Intake/Output Summary (Last 24 hours) at 05/15/13 0727 Last data filed at 05/15/13 0415  Gross per 24 hour  Intake    580 ml  Output   3100 ml  Net  -2520 ml    Exam:  General:NAD  Resp: CTAB  Cardiac:RRR, no m/r/g, no JVD, no carotid bruits  [id number], NT, ND  MSK: extremities warm, no edema  Neuro: no focal deficits    Lab Results:  Basic Metabolic Panel:  Recent Labs Lab 05/11/13 0440 05/13/13 0435 05/14/13 1622  NA 138 139 135  K 4.2 3.4* 4.1  CL 105 100 98  CO2 17* 29 28  GLUCOSE 95 104* 101*  BUN 82* 28* 19  CREATININE 6.26* 3.31* 2.90*  CALCIUM 7.9* 7.9* 8.3*    Liver Function Tests:  Recent Labs Lab 05/09/13 1122 05/09/13 1725 05/11/13 0440 05/13/13 0435  AST 259* 294*  --  28  ALT 470* 500*  --  164*  ALKPHOS 205* 201*  --  137*  BILITOT 2.6* 2.6*  --  0.8  PROT 8.2 7.7  --  6.6  ALBUMIN 3.9 3.7 3.0* 3.0*    CBC:  Recent Labs Lab 05/11/13 0421 05/13/13 0435 05/14/13 1622  WBC 11.5* 8.8 8.4  HGB 9.2* 9.2* 10.6*  HCT 27.4* 28.2* 32.6*  MCV 71.2* 70.9* 72.6*  PLT 193 139* 127*    Cardiac Enzymes:  Recent Labs Lab 05/09/13 1122  TROPONINI <0.30    BNP:  Recent Labs  01/06/13 1847 01/24/13 0720 05/09/13 1122  PROBNP 61864.0* >70000.0* >70000.0*    Coagulation: No results found for  this basename: INR,  in the last 168 hours  ECG:   Medications:   Scheduled Medications: . antiseptic oral rinse  15 mL Mouth Rinse BID  . aspirin  325 mg Oral Daily  . carvedilol  12.5 mg Oral BID WC  . cloNIDine  0.2 mg Oral TID  . hydrALAZINE  50 mg Oral Q8H  . insulin aspart  0-15 Units Subcutaneous TID WC  . insulin aspart  0-5 Units Subcutaneous QHS  . minoxidil  5 mg Oral BID     Infusions: . labetalol (NORMODYNE) infusion Stopped (05/11/13 1100)     PRN Medications:  fentaNYL     Assessment/Plan    1. Acute on chronic systolic heart failure  - volume status improved with HD  - throught to be hypertensive cardiomyopathy, previous biopsy not specific for amyloid. He had not had a prior ischemic evaluation, the results are pending from a stress MPI done yesterday. There are issues with the dictation system, per Dr Ron Parker review of the study there is some evidence of inferolateral ischemia, however still low risk exam.  -patient prefers to stay for diagnostic cath this admission,  will arrange LHC and RHC. Etiology likely hypertensive CM, prior workup not consistent but did not rule out completely amyloid. I do not feel strongly that a biopsy is warranted considering risk vs.benefit  - started coreg yesterday, tolerating it well. Will start lisinopril 10mg  now that he is established on dialysis, overtime ideally titrate up beta blocker and ACE-I and wean clonidine and minoxidil.    2. HTN - blood pressures improving, still elevated - add ACE-I as described above, titration as described above.     Carlyle Dolly, M.D., F.A.C.C.

## 2013-05-15 NOTE — Progress Notes (Signed)
Report has been given to nightshift RN.  Nurse denies any questions or concerns at this time.  Pt had no acute events throughout the day.  Per MD plan for heart cath in the am.  Pt resting comfortably in bed and appears in no acute distress. Gae Gallop RN

## 2013-05-15 NOTE — Progress Notes (Addendum)
Patient ID: Joshua [name], male   DOB: 25-Apr-1981, 32 y.o.   MRN: [id number]  [facility] Progress Note    Subjective:   No complaints, results of stress test and plan for [facility] tomorrow noted   Objective:   BP 183/111  Pulse 87  Temp(Src) 97.8 F (36.6 C) (Oral)  Resp 20  Ht 5' 11.5" (1.816 m)  Wt 112.5 kg (248 lb 0.3 oz)  BMI 34.11 kg/m2  SpO2 100%  Intake/Output: I/O last 3 completed shifts: In: 1060 [P.O.:1060] Out: 3100 [Urine:1100; Other:2000]   Intake/Output this shift:  Total I/O In: 240 [P.O.:240] Out: -  Weight change: -1.988 kg (-4 lb 6.1 oz)  Physical Exam: Gen:WD AAM in NAD CVS:no rub Resp:cta [id number] Ext:no edema, LUE AVF +T/B  Labs: BMET  Recent Labs Lab 05/09/13 1122 05/09/13 1725 05/09/13 2135 05/10/13 0845 05/10/13 1020 05/11/13 0440 05/13/13 0435 05/14/13 1622 05/15/13 0600  NA 131*  --   --  135  --  138 139 135 135  K 5.6*  --   --  4.5  --  4.2 3.4* 4.1 4.2  CL 94*  --   --  100  --  105 100 98 99  CO2 12*  --   --  15*  --  17* 29 28 24   GLUCOSE 128*  --   --  125*  --  95 104* 101* 96  BUN 85*  --   --  86*  --  82* 28* 19 25*  CREATININE 7.03*  --   --  6.64*  --  6.26* 3.31* 2.90* 3.57*  ALBUMIN 3.9 3.7  --   --   --  3.0* 3.0*  --   --   CALCIUM 9.3  --   --  8.2*  --  7.9* 7.9* 8.3* 8.7  PHOS  --   --  5.3*  --  5.0* 5.8*  --   --   --    CBC  Recent Labs Lab 05/09/13 1122  05/11/13 0421 05/13/13 0435 05/14/13 1622 05/15/13 0600  WBC 16.6*  < > 11.5* 8.8 8.4 8.0  NEUTROABS 13.4*  --   --   --   --   --   HGB 11.0*  < > 9.2* 9.2* 10.6* 10.1*  HCT 32.7*  < > 27.4* 28.2* 32.6* 31.3*  MCV 70.6*  < > 71.2* 70.9* 72.6* 72.6*  PLT 307  < > 193 139* 127* 126*  < > = values in this interval not displayed.  @IMGRELPRIORS @ Medications:    . antiseptic oral rinse  15 mL Mouth Rinse BID  . aspirin  325 mg Oral Daily  . carvedilol  12.5 mg Oral BID WC  . cloNIDine  0.2 mg Oral TID  . hydrALAZINE  50 mg  Oral Q8H  . insulin aspart  0-15 Units Subcutaneous TID WC  . insulin aspart  0-5 Units Subcutaneous QHS  . lisinopril  10 mg Oral Daily  . minoxidil  5 mg Oral BID     Assessment/ Plan:   1. Malignant HTN- now on po meds. Still not well controlled. Increase lisinopril to 40mg  qhs 2. New ESRD: secondary to poorly controlled HTN. Tolerated HD well yesterday. Set up for outpt HD at [facility] schedule (second shift). For holiday schedule, they are on MWSat so will plan on HD after LHC MOnday. 3. Anemia: dropping. Will start ESA 4. Iron deficiency- will dose Feraheme IV and follow 5. CKD-MBD: follow Ca/Phos/iPTH and  initiate vit D therapy dose pending iPTH level 6. Nutrition:follow 7. Hypertension:as above. Improving with labetalol and HD 8. Vascular access: LUE AVF placed last month by Dr. Bridgett Larsson not yet mature for use, s/p PC placed 12/16 by [facility].  9. HA/Abd pain- treat BP. Improved 10. Dispo- cont with BP control and hopeful d/c to home after LHC and outpt HD as above. Paton A 05/15/2013, 11:40 AM

## 2013-05-16 ENCOUNTER — Encounter (HOSPITAL_COMMUNITY)
Admission: EM | Disposition: A | Discharge status: Home / Self Care | Payer: Self-pay | Source: Home / Self Care | Attending: Internal Medicine

## 2013-05-16 DIAGNOSIS — I428 Other cardiomyopathies: Secondary | ICD-10-CM

## 2013-05-16 HISTORY — PX: LEFT AND RIGHT HEART CATHETERIZATION WITH CORONARY ANGIOGRAM: SHX5449

## 2013-05-16 LAB — GLUCOSE, CAPILLARY
Glucose-Capillary: 101 mg/dL — ABNORMAL HIGH (ref 70–99)
Glucose-Capillary: 119 mg/dL — ABNORMAL HIGH (ref 70–99)

## 2013-05-16 LAB — POCT I-STAT 3, ART BLOOD GAS (G3+)
Bicarbonate: 24 mEq/L (ref 20.0–24.0)
O2 Saturation: 93 %
TCO2: 25 mmol/L (ref 0–100)
pCO2 arterial: 36.3 mmHg (ref 35.0–45.0)

## 2013-05-16 LAB — CBC
HCT: 31.8 % — ABNORMAL LOW (ref 39.0–52.0)
MCV: 71.5 fL — ABNORMAL LOW (ref 78.0–100.0)
Platelets: 134 10*3/uL — ABNORMAL LOW (ref 150–400)
RBC: 4.45 MIL/uL (ref 4.22–5.81)
RDW: 22.7 % — ABNORMAL HIGH (ref 11.5–15.5)
WBC: 9.3 10*3/uL (ref 4.0–10.5)

## 2013-05-16 LAB — BASIC METABOLIC PANEL
BUN: 34 mg/dL — ABNORMAL HIGH (ref 6–23)
CO2: 23 mEq/L (ref 19–32)
Chloride: 97 mEq/L (ref 96–112)
Creatinine, Ser: 4.25 mg/dL — ABNORMAL HIGH (ref 0.50–1.35)
GFR calc Af Amer: 20 mL/min — ABNORMAL LOW (ref >90)
Potassium: 4.1 mEq/L (ref 3.5–5.1)

## 2013-05-16 LAB — PROTIME-INR
INR: 1.22 (ref 0.00–1.49)
Prothrombin Time: 15.1 seconds (ref 11.6–15.2)

## 2013-05-16 LAB — POCT I-STAT 3, VENOUS BLOOD GAS (G3P V)
pH, Ven: 7.391 — ABNORMAL HIGH (ref 7.250–7.300)
pO2, Ven: 38 mmHg (ref 30.0–45.0)

## 2013-05-16 SURGERY — LEFT AND RIGHT HEART CATHETERIZATION WITH CORONARY ANGIOGRAM
Anesthesia: LOCAL

## 2013-05-16 MED ORDER — SODIUM CHLORIDE 0.9 % IJ SOLN: 3.0000 mL | INTRAMUSCULAR | Status: DC | PRN

## 2013-05-16 MED ORDER — CALCIUM ACETATE 667 MG PO CAPS: 667.0000 mg | ORAL_CAPSULE | Freq: Three times a day (TID) | ORAL | Status: DC

## 2013-05-16 MED ORDER — HEPARIN (PORCINE) IN NACL 2-0.9 UNIT/ML-% IJ SOLN
INTRAMUSCULAR | Status: AC
  Filled 2013-05-16: qty 1000

## 2013-05-16 MED ORDER — DOXERCALCIFEROL 4 MCG/2ML IV SOLN
INTRAVENOUS | Status: AC
  Administered 2013-05-16: 21:00:00
  Filled 2013-05-16: qty 2

## 2013-05-16 MED ORDER — CARVEDILOL 12.5 MG PO TABS: 12.5000 mg | ORAL_TABLET | Freq: Two times a day (BID) | ORAL | Status: DC

## 2013-05-16 MED ORDER — MIDAZOLAM HCL 2 MG/2ML IJ SOLN
INTRAMUSCULAR | Status: AC
  Filled 2013-05-16: qty 2

## 2013-05-16 MED ORDER — METOPROLOL TARTRATE 25 MG PO TABS: 25.0000 mg | ORAL_TABLET | Freq: Two times a day (BID) | ORAL | Status: DC

## 2013-05-16 MED ORDER — ASPIRIN 81 MG PO CHEW
81.0000 mg | CHEWABLE_TABLET | ORAL | Status: AC
  Administered 2013-05-16: 07:00:00 81 mg via ORAL
  Filled 2013-05-16: qty 1

## 2013-05-16 MED ORDER — SODIUM CHLORIDE 0.9 % IV SOLN: 250.0000 mL | INTRAVENOUS | Status: DC | PRN

## 2013-05-16 MED ORDER — FENTANYL CITRATE 0.05 MG/ML IJ SOLN
INTRAMUSCULAR | Status: AC
  Filled 2013-05-16: qty 2

## 2013-05-16 MED ORDER — HYDRALAZINE HCL 50 MG PO TABS: 50.0000 mg | ORAL_TABLET | Freq: Three times a day (TID) | ORAL | Status: DC

## 2013-05-16 MED ORDER — CALCIUM ACETATE 667 MG PO CAPS
667.0000 mg | ORAL_CAPSULE | Freq: Three times a day (TID) | ORAL | Status: DC
  Administered 2013-05-16 – 2013-05-17 (×3): 667 mg via ORAL
  Filled 2013-05-16 (×6): qty 1

## 2013-05-16 MED ORDER — LIDOCAINE HCL (PF) 1 % IJ SOLN
INTRAMUSCULAR | Status: AC
  Filled 2013-05-16: qty 30

## 2013-05-16 MED ORDER — SODIUM CHLORIDE 0.9 % IJ SOLN
3.0000 mL | Freq: Two times a day (BID) | INTRAMUSCULAR | Status: DC
  Administered 2013-05-16: 3 mL via INTRAVENOUS

## 2013-05-16 MED ORDER — LISINOPRIL 10 MG PO TABS
10.0000 mg | ORAL_TABLET | Freq: Every day | ORAL | Status: DC
  Filled 2013-05-16: qty 1

## 2013-05-16 MED ORDER — DOXERCALCIFEROL 4 MCG/2ML IV SOLN
2.0000 ug | INTRAVENOUS | Status: DC
  Administered 2013-05-16 (×2): 2 ug via INTRAVENOUS
  Filled 2013-05-16: qty 2

## 2013-05-16 MED ORDER — NITROGLYCERIN 0.2 MG/ML ON CALL CATH LAB
INTRAVENOUS | Status: AC
  Filled 2013-05-16: qty 1

## 2013-05-16 MED ORDER — HEPARIN SODIUM (PORCINE) 1000 UNIT/ML DIALYSIS: 20.0000 [IU]/kg | INTRAMUSCULAR | Status: DC | PRN

## 2013-05-16 MED ORDER — MINOXIDIL 2.5 MG PO TABS: 5.0000 mg | ORAL_TABLET | Freq: Two times a day (BID) | ORAL | Status: DC

## 2013-05-16 NOTE — Interval H&P Note (Signed)
History and Physical Interval Note:  05/16/2013 12:57 PM  Joshua [name]  has presented today for cardiac cath with the diagnosis of CHF, cardiomyopathy, abnormal stress test.  The various methods of treatment have been discussed with the patient and family. After consideration of risks, benefits and other options for treatment, the patient has consented to  Procedure(s): LEFT AND RIGHT HEART CATHETERIZATION WITH CORONARY ANGIOGRAM (N/A) as a surgical intervention .  The patient's history has been reviewed, patient examined, no change in status, stable for surgery.  I have reviewed the patient's chart and labs.  Questions were answered to the patient's satisfaction.    Cath Lab Visit (complete for each Cath Lab visit)  Clinical Evaluation Leading to the Procedure:   ACS: no  Non-ACS:    Anginal Classification: CCS II  Anti-ischemic medical therapy: Minimal Therapy (1 class of medications)  Non-Invasive Test Results: Intermediate-risk stress test findings: cardiac mortality 1-3%/year  Prior CABG: No previous CABG        MCALHANY,CHRISTOPHER

## 2013-05-16 NOTE — Progress Notes (Signed)
Patient: Joshua [name] / Admit Date: 05/09/2013 / Date of Encounter: 05/16/2013, 9:21 AM   Subjective  Denies CP. No SOB at rest or orthopnea. Still some SOB when up walking around.  Objective   Telemetry: NSR  Physical Exam: Blood pressure 142/89, pulse 74, temperature 97.8 F (36.6 C), temperature source Oral, resp. rate 19, height 5' 11.5" (1.816 m), weight 248 lb 0.3 oz (112.5 kg), SpO2 100.00%. General: Well developed, well nourished obese AAM in no acute distress. Head: Normocephalic, atraumatic, sclera non-icteric, no xanthomas, nares are without discharge. Neck: JVP not elevated. Lungs: Clear bilaterally to auscultation without wheezes, rales, or rhonchi. Breathing is unlabored. Heart: RRR S1 S2 without murmurs, rubs, or gallops.  Abdomen: Soft, non-tender, non-distended with normoactive bowel sounds. No rebound/guarding. Extremities: No clubbing or cyanosis. No edema. Distal pedal pulses are 2+ and equal bilaterally. Neuro: Alert and oriented X 3. Moves all extremities spontaneously. Psych:  Responds to questions appropriately with a flattened affect.   Intake/Output Summary (Last 24 hours) at 05/16/13 0921 Last data filed at 05/16/13 0858  Gross per 24 hour  Intake    360 ml  Output   1600 ml  Net  -1240 ml    Inpatient Medications:  . antiseptic oral rinse  15 mL Mouth Rinse BID  . aspirin  325 mg Oral Daily  . carvedilol  12.5 mg Oral BID WC  . cloNIDine  0.2 mg Oral TID  . [START ON 05/17/2013] darbepoetin (ARANESP) injection - DIALYSIS  60 mcg Intravenous Q Tue-HD  . hydrALAZINE  50 mg Oral Q8H  . insulin aspart  0-15 Units Subcutaneous TID WC  . insulin aspart  0-5 Units Subcutaneous QHS  . lisinopril  10 mg Oral Daily  . minoxidil  5 mg Oral BID  . sodium chloride  3 mL Intravenous Q12H   Infusions:  . labetalol (NORMODYNE) infusion Stopped (05/11/13 1100)    Labs:  Recent Labs  05/15/13 0600 05/16/13 0515  NA 135 131*  K 4.2 4.1  CL 99 97    CO2 24 23  GLUCOSE 96 104*  BUN 25* 34*  CREATININE 3.57* 4.25*  CALCIUM 8.7 8.6   No results found for this basename: AST, ALT, ALKPHOS, BILITOT, PROT, ALBUMIN,  in the last 72 hours  Recent Labs  05/15/13 0600 05/16/13 0515  WBC 8.0 9.3  HGB 10.1* 10.4*  HCT 31.3* 31.8*  MCV 72.6* 71.5*  PLT 126* 134*   No results found for this basename: CKTOTAL, CKMB, TROPONINI,  in the last 72 hours No components found with this basename: POCBNP,  No results found for this basename: HGBA1C,  in the last 72 hours   Radiology/Studies:  Nm Myocar Multi W/spect W/wall Motion / Ef  05/15/2013   CLINICAL DATA:  Chest pain  EXAM: Lexiscan Myovue  TECHNIQUE: The patient received IV Lexiscan .4mg  over 15 seconds. 33.0 mCi of Technetium 13m Sestamibi injected at 30 seconds. Quantitative SPECT images were obtained in the vertical, horizontal and short axis planes after a 45 minute delay. Rest images were obtained with similar planes and delay using 10.2 mCi of Technetium 79m Sestamibi.  FINDINGS: ECG: SR, LVH with repolarization abnormalities, unchanged at stress. No ischemia.  Symptoms: SOB  RAW Data:  There is motion.  Normal lung to heart ratio.  Quantitative Gated SPECT EF:  LVEF 46%  Perfusion Images: There is evidence of some inferolateral ischemia. It is not a high risk scan. However it is not normal. Because this patient is  young and we do not have a definitive diagnosis concerning his left ventricular dysfunction and because his nuclear scan is not completely normal, I feel that it would be appropriate at some time to proceed with diagnostic cardiac catheterization. I feel this is not urgent. Decisions about the timing for this can be based on his other ongoing workup.  IMPRESSION: Mild inferolateral ischemia. Mild LV dysfunction. A cardiac catheterization is recommended.  Dola Argyle, MD   Electronically Signed   By: Ena Dawley   On: 05/15/2013 19:27   US Abdomen Port  05/10/2013   CLINICAL  DATA:  Diabetes. Hypertension. Renal failure. Morbid obesity .  EXAM: ULTRASOUND PORTABLE ABDOMEN  COMPARISON:  04/12/2013.  FINDINGS: Gallbladder:  Multiple mobile gallstones again noted. Gallbladder wall thickness normal at 1.8 mm. Negative Murphy sign. No pericholecystic fluid collection.  Common bile duct:  Diameter: 5.3 mm.  Liver:  Increased echogenicity in the liver consistent with fatty infiltration.  IVC:  No abnormality visualized.  Pancreas:  Visualized portion unremarkable.  Spleen:  Size and appearance within normal limits.  Right Kidney:  Length: Left 0.1 cm. Echogenic consistent with chronic medical renal disease.  Left Kidney:  Length: 10.3 cm. Echogenic consistent chronic medical renal disease.  Abdominal aorta:  No aneurysm visualized.  Other findings:  None.  IMPRESSION: 1. Gallstones.  No evidence of cholecystitis. 2. Fatty infiltration liver. 3. Increased echogenicity noted both kidneys, this suggests chronic medical renal disease.   Electronically Signed   By: Marcello Moores  Register   On: 05/10/2013 12:19   Dg Chest Port 1 View  05/10/2013   CLINICAL DATA:  Rule out pneumothorax following catheter placement.  EXAM: PORTABLE CHEST - 1 VIEW  COMPARISON:  05/09/2013  FINDINGS: There has been interval placement of a right jugular approach dialysis catheter with tips overlying the lower SVC. No pneumothorax is identified. Lung volumes are mildly diminished compared to the prior exam with persistent pulmonary vascular congestion. Cardiac silhouette remains enlarged.  IMPRESSION: 1. Interval placement of right jugular dialysis catheter as above. No pneumothorax identified. 2. Persistent cardiomegaly and pulmonary vascular congestion.   Electronically Signed   By: Logan Bores   On: 05/10/2013 17:02   Dg Chest Port 1 View  05/09/2013   CLINICAL DATA:  Left upper chest pain. Shortness of breath. Cough and nausea.  EXAM: PORTABLE CHEST - 1 VIEW  COMPARISON:  Two-view chest 04/11/2013.  FINDINGS: The  heart is enlarged. Mild pulmonary vascular congestion is evident. No focal airspace consolidation is present. The visualized soft tissues and bony thorax are unremarkable.  IMPRESSION: 1. Stable cardiomegaly with slight progression of pulmonary vascular congestion suggesting early congestive heart failure. 2. No focal airspace consolidation.   Electronically Signed   By: Lawrence Santiago M.D.   On: 05/09/2013 11:51   Dg Fluoro Guide Cv Line-no Report  05/10/2013   CLINICAL DATA: diatek placement   FLOURO GUIDE CV LINE  Fluoroscopy was utilized by the requesting physician.  No radiographic  interpretation.      Assessment and Plan  1. Acute on chronic systolic and diastolic CHF with cardiomyopathy EF 30% 2. Abnormal stress test 3. ESRD (recent progression to HD) 4. Accelerated hypertension  5. Microcytic anemia/mild thrombocytopenia, anemia felt to be of chronic disease with iron deficiency 6. Obesity Body mass index is 34.11 kg/(m^2).  LHC today to rule out CAD as cause of cardiomyopathy. Renal plans for HD afterwards. Per prior notes etiology of CM is likely hypertensive heart disease with prior workup  not consistent but did not rule out completely amyloid. Dr. Harl Bowie did not feel biopsy was warranted considering risk vs benefit. BP improved on current regimen and HD. Cont ASA, BB, ACEI. Recent lipid panel in September significant only for low HDL of 17. Consider addition of statin if CAD present. ?repeat echo and timing of possible EP ref for ICD given EF 30% in 12/2012. Pt does report med compliance as outpt.   Signed, Melina Copa PA-C  I have personally seen and examined this patient with Melina Copa, PA- I agree with the assessment and plan as outlined above. Plans for right and left heart cath later today to exclude obstructive CAD. He has no questions.  Maribella Kuna 12:05 PM 05/16/2013

## 2013-05-16 NOTE — CV Procedure (Signed)
      Cardiac Catheterization Operative Report  Darris Bippus [id number] 12/22/20141:46 PM Provider Not In System  Procedure Performed:  1. Left Heart Catheterization 2. Selective Coronary Angiography 3. Right Heart Catheterization  Operator: Lauree Chandler, MD  Indication: 32 yo male with history of ESRD on HD, HTN, cardiomyopathy. Right and left heart cath today to assess pressures and exclude obstructive CAD.                                  Procedure Details: The risks, benefits, complications, treatment options, and expected outcomes were discussed with the patient. The patient and/or family concurred with the proposed plan, giving informed consent. The patient was brought to the cath lab after IV hydration was begun and oral premedication was given. The patient was further sedated with Versed and Fentanyl. The right groin was prepped and draped in the usual manner. Using the modified Seldinger access technique, a 5 French sheath was placed in the right femoral artery. A 6 French sheath was inserted into the right femoral vein. A multi-purpose catheter was used to perform a right heart catheterization. Standard diagnostic catheters were used to perform selective coronary angiography. His vessels are large and were difficult to fill. I used a EBU 3.0 guide to engage the left main. A pigtail catheter was used to measure left ventricular pressures. There were no immediate complications. The patient was taken to the recovery area in stable condition.   Hemodynamic Findings: Ao:  154/98         LV: 153/7/18 RA: 5           RV: 44/3/8 PA: 43/16 (mean 28)         PCWP:  15 Fick Cardiac Output: 10.4 L/min Fick Cardiac Index: 4.48 L/min/m2 Central Aortic Saturation: 93% Pulmonary Artery Saturation: 72%  Angiographic Findings:  Left main: No obstructive disease.   Left Anterior Descending Artery: Large caliber vessel that courses to the apex. There are several diagonal  branches. No obstructive disease.   Circumflex Artery: Large caliber vessel with two obtuse marginal branches. No obstructive disease.   Right Coronary Artery: Large dominant vessel with no obstructive disease.   Left Ventricular Angiogram: Deferred  Impression: 1. No angiographic evidence of CAD 2. Non-ischemic cardiomyopathy 3. Mild pulmonary HTN  Recommendations: Continue medical management of non-ischemic cardiomyopathy. Volume management by dialysis. He is going to dialysis later today. He will follow up with Dr. Aundra Dubin in our [address] office after discharge. OK to d/c home today after his bedrest and dialysis.        Complications:  None; patient tolerated the procedure well.

## 2013-05-16 NOTE — Progress Notes (Signed)
Pt arrived from cath lab stable Rt groin femoral level  0 - Pedal pulse 2 +. No c/o pain.

## 2013-05-16 NOTE — Progress Notes (Signed)
UR completed Moises Terpstra K. Xia Stohr, RN, BSN, [place], CCM  05/16/2013 4:06 PM

## 2013-05-16 NOTE — Care Management Note (Addendum)
  Page 1 of 1   05/17/2013     10:46:03 AM   CARE MANAGEMENT NOTE 05/17/2013  Patient:  Joshua [name], Joshua [name]   Account Number:  [id number]  Date Initiated:  05/10/2013  Documentation initiated by:  Luz Lex  Subjective/Objective Assessment:   HTN crisis - ESRD - need for start of HD.  Cardene drip.     Action/Plan:   Anticipated DC Date:  05/13/2013   Anticipated DC Plan:  [place]  CM consult      Choice offered to / List presented to:             Status of service:  Completed, signed off Medicare Important Message given?   (If response is "NO", the following Medicare IM given date fields will be blank) Date Medicare IM given:   Date Additional Medicare IM given:    Discharge Disposition:  HOME/SELF CARE  Per UR Regulation:  Reviewed for med. necessity/level of care/duration of stay  If discussed at [place] of Stay Meetings, dates discussed:    Comments:  05/17/2013 New Dialysis patient. Plan:  OP Dialysis. CM contacted Dialysis, contact Bethena Roys [phone] to confirm patient has been clipped and ready for d/c. Disposition:  Home/self care Nixon RN, BSN, [facility], CCM  05/16/2013 Plan:  D/C to home Hx/o  ESRD (scheduled to start HD). Jakaden Ouzts RN, BSN, [place], CCM  05/16/2013   Contact:  Luetta Nutting [phone]

## 2013-05-16 NOTE — H&P (View-Only) (Signed)
Patient: Joshua [name] / Admit Date: 05/09/2013 / Date of Encounter: 05/16/2013, 9:21 AM   Subjective  Denies CP. No SOB at rest or orthopnea. Still some SOB when up walking around.  Objective   Telemetry: NSR  Physical Exam: Blood pressure 142/89, pulse 74, temperature 97.8 F (36.6 C), temperature source Oral, resp. rate 19, height 5' 11.5" (1.816 m), weight 248 lb 0.3 oz (112.5 kg), SpO2 100.00%. General: Well developed, well nourished obese AAM in no acute distress. Head: Normocephalic, atraumatic, sclera non-icteric, no xanthomas, nares are without discharge. Neck: JVP not elevated. Lungs: Clear bilaterally to auscultation without wheezes, rales, or rhonchi. Breathing is unlabored. Heart: RRR S1 S2 without murmurs, rubs, or gallops.  Abdomen: Soft, non-tender, non-distended with normoactive bowel sounds. No rebound/guarding. Extremities: No clubbing or cyanosis. No edema. Distal pedal pulses are 2+ and equal bilaterally. Neuro: Alert and oriented X 3. Moves all extremities spontaneously. Psych:  Responds to questions appropriately with a flattened affect.   Intake/Output Summary (Last 24 hours) at 05/16/13 0921 Last data filed at 05/16/13 0858  Gross per 24 hour  Intake    360 ml  Output   1600 ml  Net  -1240 ml    Inpatient Medications:  . antiseptic oral rinse  15 mL Mouth Rinse BID  . aspirin  325 mg Oral Daily  . carvedilol  12.5 mg Oral BID WC  . cloNIDine  0.2 mg Oral TID  . [START ON 05/17/2013] darbepoetin (ARANESP) injection - DIALYSIS  60 mcg Intravenous Q Tue-HD  . hydrALAZINE  50 mg Oral Q8H  . insulin aspart  0-15 Units Subcutaneous TID WC  . insulin aspart  0-5 Units Subcutaneous QHS  . lisinopril  10 mg Oral Daily  . minoxidil  5 mg Oral BID  . sodium chloride  3 mL Intravenous Q12H   Infusions:  . labetalol (NORMODYNE) infusion Stopped (05/11/13 1100)    Labs:  Recent Labs  05/15/13 0600 05/16/13 0515  NA 135 131*  K 4.2 4.1  CL 99 97    CO2 24 23  GLUCOSE 96 104*  BUN 25* 34*  CREATININE 3.57* 4.25*  CALCIUM 8.7 8.6   No results found for this basename: AST, ALT, ALKPHOS, BILITOT, PROT, ALBUMIN,  in the last 72 hours  Recent Labs  05/15/13 0600 05/16/13 0515  WBC 8.0 9.3  HGB 10.1* 10.4*  HCT 31.3* 31.8*  MCV 72.6* 71.5*  PLT 126* 134*   No results found for this basename: CKTOTAL, CKMB, TROPONINI,  in the last 72 hours No components found with this basename: POCBNP,  No results found for this basename: HGBA1C,  in the last 72 hours   Radiology/Studies:  Nm Myocar Multi W/spect W/wall Motion / Ef  05/15/2013   CLINICAL DATA:  Chest pain  EXAM: Lexiscan Myovue  TECHNIQUE: The patient received IV Lexiscan .4mg  over 15 seconds. 33.0 mCi of Technetium 35m Sestamibi injected at 30 seconds. Quantitative SPECT images were obtained in the vertical, horizontal and short axis planes after a 45 minute delay. Rest images were obtained with similar planes and delay using 10.2 mCi of Technetium 43m Sestamibi.  FINDINGS: ECG: SR, LVH with repolarization abnormalities, unchanged at stress. No ischemia.  Symptoms: SOB  RAW Data:  There is motion.  Normal lung to heart ratio.  Quantitative Gated SPECT EF:  LVEF 46%  Perfusion Images: There is evidence of some inferolateral ischemia. It is not a high risk scan. However it is not normal. Because this patient is  young and we do not have a definitive diagnosis concerning his left ventricular dysfunction and because his nuclear scan is not completely normal, I feel that it would be appropriate at some time to proceed with diagnostic cardiac catheterization. I feel this is not urgent. Decisions about the timing for this can be based on his other ongoing workup.  IMPRESSION: Mild inferolateral ischemia. Mild LV dysfunction. A cardiac catheterization is recommended.  Dola Argyle, MD   Electronically Signed   By: Ena Dawley   On: 05/15/2013 19:27   US Abdomen Port  05/10/2013   CLINICAL  DATA:  Diabetes. Hypertension. Renal failure. Morbid obesity .  EXAM: ULTRASOUND PORTABLE ABDOMEN  COMPARISON:  04/12/2013.  FINDINGS: Gallbladder:  Multiple mobile gallstones again noted. Gallbladder wall thickness normal at 1.8 mm. Negative Murphy sign. No pericholecystic fluid collection.  Common bile duct:  Diameter: 5.3 mm.  Liver:  Increased echogenicity in the liver consistent with fatty infiltration.  IVC:  No abnormality visualized.  Pancreas:  Visualized portion unremarkable.  Spleen:  Size and appearance within normal limits.  Right Kidney:  Length: Left 0.1 cm. Echogenic consistent with chronic medical renal disease.  Left Kidney:  Length: 10.3 cm. Echogenic consistent chronic medical renal disease.  Abdominal aorta:  No aneurysm visualized.  Other findings:  None.  IMPRESSION: 1. Gallstones.  No evidence of cholecystitis. 2. Fatty infiltration liver. 3. Increased echogenicity noted both kidneys, this suggests chronic medical renal disease.   Electronically Signed   By: Marcello Moores  Register   On: 05/10/2013 12:19   Dg Chest Port 1 View  05/10/2013   CLINICAL DATA:  Rule out pneumothorax following catheter placement.  EXAM: PORTABLE CHEST - 1 VIEW  COMPARISON:  05/09/2013  FINDINGS: There has been interval placement of a right jugular approach dialysis catheter with tips overlying the lower SVC. No pneumothorax is identified. Lung volumes are mildly diminished compared to the prior exam with persistent pulmonary vascular congestion. Cardiac silhouette remains enlarged.  IMPRESSION: 1. Interval placement of right jugular dialysis catheter as above. No pneumothorax identified. 2. Persistent cardiomegaly and pulmonary vascular congestion.   Electronically Signed   By: Logan Bores   On: 05/10/2013 17:02   Dg Chest Port 1 View  05/09/2013   CLINICAL DATA:  Left upper chest pain. Shortness of breath. Cough and nausea.  EXAM: PORTABLE CHEST - 1 VIEW  COMPARISON:  Two-view chest 04/11/2013.  FINDINGS: The  heart is enlarged. Mild pulmonary vascular congestion is evident. No focal airspace consolidation is present. The visualized soft tissues and bony thorax are unremarkable.  IMPRESSION: 1. Stable cardiomegaly with slight progression of pulmonary vascular congestion suggesting early congestive heart failure. 2. No focal airspace consolidation.   Electronically Signed   By: Lawrence Santiago M.D.   On: 05/09/2013 11:51   Dg Fluoro Guide Cv Line-no Report  05/10/2013   CLINICAL DATA: diatek placement   FLOURO GUIDE CV LINE  Fluoroscopy was utilized by the requesting physician.  No radiographic  interpretation.      Assessment and Plan  1. Acute on chronic systolic and diastolic CHF with cardiomyopathy EF 30% 2. Abnormal stress test 3. ESRD (recent progression to HD) 4. Accelerated hypertension  5. Microcytic anemia/mild thrombocytopenia, anemia felt to be of chronic disease with iron deficiency 6. Obesity Body mass index is 34.11 kg/(m^2).  LHC today to rule out CAD as cause of cardiomyopathy. Renal plans for HD afterwards. Per prior notes etiology of CM is likely hypertensive heart disease with prior workup  not consistent but did not rule out completely amyloid. Dr. Harl Bowie did not feel biopsy was warranted considering risk vs benefit. BP improved on current regimen and HD. Cont ASA, BB, ACEI. Recent lipid panel in September significant only for low HDL of 17. Consider addition of statin if CAD present. ?repeat echo and timing of possible EP ref for ICD given EF 30% in 12/2012. Pt does report med compliance as outpt.   Signed, Melina Copa PA-C  I have personally seen and examined this patient with Melina Copa, PA- I agree with the assessment and plan as outlined above. Plans for right and left heart cath later today to exclude obstructive CAD. He has no questions.  Cheyenne Schumm 12:05 PM 05/16/2013

## 2013-05-16 NOTE — Progress Notes (Signed)
Patient ID: Joshua [name], male   DOB: 1980/12/16, 32 y.o.   MRN: [id number]  [facility] Progress Note    Subjective:   No complaints, feels better since starting hd, plan for heart cath followed by hd today.    Objective:   BP 165/93  Pulse 88  Temp(Src) 98.1 F (36.7 C) (Oral)  Resp 18  Ht 5' 11.5" (1.816 m)  Wt 112.5 kg (248 lb 0.3 oz)  BMI 34.11 kg/m2  SpO2 100%  Intake/Output: I/O last 3 completed shifts: In: 700 [P.O.:700] Out: 2200 [Urine:2200]   Intake/Output this shift:  Total I/O In: 0  Out: 240 [Urine:240] Weight change: 0 kg (0 lb)  Physical Exam: Gen:WD AAM in NAD CVS:no rub Resp:cta [id number] Ext:no edema, LUE AVF +T/B- placed 11/24  Labs: BMET  Recent Labs Lab 05/09/13 1122 05/09/13 1725 05/09/13 2135 05/10/13 0845 05/10/13 1020 05/11/13 0440 05/13/13 0435 05/14/13 1622 05/15/13 0600 05/16/13 0515  NA 131*  --   --  135  --  138 139 135 135 131*  K 5.6*  --   --  4.5  --  4.2 3.4* 4.1 4.2 4.1  CL 94*  --   --  100  --  105 100 98 99 97  CO2 12*  --   --  15*  --  17* 29 28 24 23   GLUCOSE 128*  --   --  125*  --  95 104* 101* 96 104*  BUN 85*  --   --  86*  --  82* 28* 19 25* 34*  CREATININE 7.03*  --   --  6.64*  --  6.26* 3.31* 2.90* 3.57* 4.25*  ALBUMIN 3.9 3.7  --   --   --  3.0* 3.0*  --   --   --   CALCIUM 9.3  --   --  8.2*  --  7.9* 7.9* 8.3* 8.7 8.6  PHOS  --   --  5.3*  --  5.0* 5.8*  --   --   --   --    CBC  Recent Labs Lab 05/09/13 1122  05/13/13 0435 05/14/13 1622 05/15/13 0600 05/16/13 0515  WBC 16.6*  < > 8.8 8.4 8.0 9.3  NEUTROABS 13.4*  --   --   --   --   --   HGB 11.0*  < > 9.2* 10.6* 10.1* 10.4*  HCT 32.7*  < > 28.2* 32.6* 31.3* 31.8*  MCV 70.6*  < > 70.9* 72.6* 72.6* 71.5*  PLT 307  < > 139* 127* 126* 134*  < > = values in this interval not displayed.  @IMGRELPRIORS @ Medications:    . antiseptic oral rinse  15 mL Mouth Rinse BID  . aspirin  325 mg Oral Daily  . carvedilol  12.5 mg  Oral BID WC  . cloNIDine  0.2 mg Oral TID  . [START ON 05/17/2013] darbepoetin (ARANESP) injection - DIALYSIS  60 mcg Intravenous Q Tue-HD  . hydrALAZINE  50 mg Oral Q8H  . insulin aspart  0-15 Units Subcutaneous TID WC  . insulin aspart  0-5 Units Subcutaneous QHS  . lisinopril  10 mg Oral Daily  . minoxidil  5 mg Oral BID  . sodium chloride  3 mL Intravenous Q12H     Assessment/ Plan:   1. Malignant HTN- now on po meds. Better coreg, clonidine, hydralazine, minoxidil, lisinopril.  Hopefully with better volume bp will be better also 2. New ESRD: secondary to poorly controlled  HTN. Tolerated HD well yesterday. Set up for outpt HD at [facility] schedule (second shift). For holiday schedule, they are on MWSat so will plan on HD after [facility] Monday. If all goes well, next hd after today will be Wednesday at [facility] 3. Anemia: dropping. On ESA 4. Iron deficiency- s/p Feraheme IV and follow 5. CKD-MBD: follow Ca/Phos/iPTH pth 477, start hectorol- phos 5.8- start phoslo 6. Nutrition:follow 7. Hypertension:as above. Improving with HD- will try to wean meds when can 8. Vascular access: LUE AVF placed last month by Dr. Bridgett Larsson not yet mature for use, s/p PC placed 12/16 by [facility].  9. HA/Abd pain- treat BP. Improved 10. Dispo- cont with BP control and hopeful d/c to home after LHC and hd  and outpt HD as above. Bern A 05/16/2013, 10:44 AM

## 2013-05-16 NOTE — Discharge Summary (Addendum)
Physician Discharge Summary  Joshua [name] [id number] DOB: July 14, 1980 DOA: 05/09/2013  PCP: Provider Not In System  Admit date: 05/09/2013 Discharge date: 05/17/2013  Time spent: 40 minutes  Recommendations for Outpatient Follow-up:  Patient is to follow up with his primary care physician within one week of discharge. He should also continue dialysis as scheduled. Patient should continue to see Dr. Aundra Dubin within one to 2 weeks of discharge. Patient should also see [place] [facility] within 2 weeks of discharge. Patient to continue taking his medications as prescribed. Patient probably low-sodium cardiac/ renal diet, with fluid restriction of 1500 cc per day.   Discharge Diagnoses:  Active Problems:   ? Diabetes   CKD (chronic kidney disease), stage V   Anemia in chronic kidney disease   Transaminitis   Acute on chronic combined systolic and diastolic CHF (congestive heart failure)   Noncompliance   Cholelithiases   Hypertensive emergency   Hypertensive cardiovascular disease   Discharge Condition: Stable  Diet recommendation: Cardiac/renal diet with 1500 mL/day fluid restriction  Filed Weights   05/16/13 1630 05/16/13 2049 05/17/13 0610  Weight: 113.4 kg (250 lb) 111.3 kg (245 lb 6 oz) 110.179 kg (242 lb 14.4 oz)    History of present illness:  32 yo with past medical history of ESRD (scheduled to start HD), HTN, CHF (Ef 30%) brought to [facility] ED with weakness and headache. He also reported dyspnea, orthopnea and non productive cough. Noted to be hypertensive. Transferred to [facility] in anticipation of needing HD.  Hospital Course:  Is a 32 year old with past medical history of hypertension, CHF linear 30%, that was also drawn emergency department who presented with weakness and headache. Patient also had dyspnea orthopnea as well as a nonproductive cough. He was noted to be hypertensive and was transferred to [facility] cone for hemodialysis. Nephrology was consulted, diatek catheter was  placed in the left upper extremity in November the vascular team. Patient has undergone several dialysis sessions and has tolerated them well. He is set up for outpatient dialysis. Patient is also known to have acute on chronic combined systolic and diastolic heart failure. Patient did have echocardiogram conducted in August 2013 which showed 30% EF with diffuse hypokinesis and severe left ventricular hypertrophy likely hypertension etiology. Patient did have a stress as well as a Myoview which is something abnormal. Cardiology felt the patient should have a catheterization which occurred today 05/16/2013. Cardiac shadow Showed no angiographic evidence of coronary artery disease, nonischemic cardiomyopathy, mild pulmonary hypertension. Recommendations for continued medical management of nonischemic cardiomyopathy. Volume management by dialysis. He will need a followup with Dr. Aundra Dubin after discharge. Patient has had uncontrolled blood pressures while in the hospital. This is likely secondary to volume overload versus uncontrolled hypertension. He was initially placed on labetalol infusion. Patient was then transitioned to Coreg as well as lisinopril by cardiology. Goal is to wean off clonidine and minoxidil. Patient was also noted to have elevated sugars however his hemoglobin A1c was 5.6. He is on no medications. Patient also noted to have anemia of chronic kidney disease. His hemoglobin remained around 10, which is his baseline. Patient did not have any active signs of bleeding. Patient was noted to have transaminitis as well as cholelithiasis with elevated LFTs however this is likely secondary to hepatic congestion from heart failure. Patient did have cholelithiasis on ultrasound however no cholecystitis. The transaminitis was trending downward. Patient does have a long history of noncompliance. He was counseled on this and understand the need for continued medical  treatment as well as dialysis. Patient will be  discharged home. He should call his primary care physician as well as Dr. Aundra Dubin and [place] [facility] within one to 2 weeks of discharge. He should continue dialysis as scheduled. Patient should continue taking his medications as prescribed. This was discussed with the patient he does understand agree.  Procedures: Ultrasound-guided insertion of diabetic catheter 05/10/2013 Myoview stress test Cardiac catheterization  Consultations: Cardiology Nephrology PCCM  Discharge Exam: Filed Vitals:   05/17/13 0610  BP: 146/80  Pulse: 74  Temp: 97.8 F (36.6 C)  Resp: 18   Exam  General: Well developed, well nourished, NAD, appears stated age  HEENT: NCAT, PERRLA, mucous membranes moist.  Neck: Supple, no JVD, no masses  Cardiovascular: S1 S2 auscultated, no rubs, murmurs or gallops. Regular rate and rhythm.  Respiratory: Clear to auscultation bilaterally with equal chest rise  Abdomen: Soft, nontender, nondistended, + bowel sounds  Extremities: warm dry without cyanosis clubbing or edema  Neuro: AAOx3, cranial nerves grossly intact.  Skin: Without rashes exudates or nodules  Psych: Normal affect and demeanor with intact judgement and insight  Discharge Instructions      Discharge Orders   Future Appointments Provider Department Dept Phone   05/31/2013 8:50 AM Blair Dolphin Jorene Minors [place] [phone]   06/02/2013 2:00 PM [facility] [place] [phone]   06/02/2013 3:15 PM Elam Dutch, MD [facility] [phone]   Future Orders Complete By Expires   Diet - low sodium heart healthy  As directed    Discharge instructions  As directed    Comments:     Patient is to follow up with his primary care physician within one week of discharge. He should also continue dialysis as scheduled. Patient should continue to see Dr. Aundra Dubin within one to 2 weeks of discharge. Patient should also see [place] [facility]  within 2 weeks of discharge. Patient to continue taking his medications as prescribed. Patient probably low-sodium cardiac/ renal diet, with fluid restriction of 1500 cc per day.   Increase activity slowly  As directed        Medication List    STOP taking these medications       furosemide 80 MG tablet  Commonly known as:  LASIX     hydrALAZINE 100 MG tablet  Commonly known as:  APRESOLINE     metoprolol 100 MG tablet  Commonly known as:  LOPRESSOR      TAKE these medications       acetaminophen 500 MG tablet  Commonly known as:  TYLENOL  Take 1,000 mg by mouth every 6 (six) hours as needed. For pain     aspirin 325 MG tablet  Take 325 mg by mouth daily.     calcium acetate 667 MG capsule  Commonly known as:  PHOSLO  Take 1 capsule (667 mg total) by mouth 3 (three) times daily with meals.     carvedilol 12.5 MG tablet  Commonly known as:  COREG  Take 1 tablet (12.5 mg total) by mouth 2 (two) times daily with a meal.     cloNIDine 0.2 MG tablet  Commonly known as:  CATAPRES  Take 1 tablet (0.2 mg total) by mouth 3 (three) times daily.     lisinopril 10 MG tablet  Commonly known as:  PRINIVIL,ZESTRIL  Take 1 tablet (10 mg total) by mouth at bedtime.     minoxidil 2.5 MG tablet  Commonly known as:  LONITEN  Take 2 tablets (5 mg total) by mouth 2 (two) times daily.     multivitamin with minerals Tabs tablet  Take 1 tablet by mouth daily.     ondansetron 4 MG tablet  Commonly known as:  ZOFRAN  Take 4 mg by mouth every 8 (eight) hours as needed for nausea or vomiting.     sodium bicarbonate 650 MG tablet  Take 1 tablet (650 mg total) by mouth 2 (two) times daily.       Allergies  Allergen Reactions  . Isosorbide Dinitrate     Severe headaches with po nitrates (imdur, isordil), do not prescribe  . Nitrates, Organic     Severe headaches with po nitrates (imdur, isordil, etc), do not prescribe   Follow-up Information   Follow up with [facility].  Schedule an appointment as soon as possible for a visit in 2 weeks.   Contact information:   [address] [address] [phone]       Follow up with Loralie Champagne, MD. Schedule an appointment as soon as possible for a visit on 05/31/2013. (@8 :30 am spoke with Si Raider)    Specialty:  Cardiology   Contact information:   [address] [place] [address] [phone]        The results of significant diagnostics from this hospitalization (including imaging, microbiology, ancillary and laboratory) are listed below for reference.    Significant Diagnostic Studies: Nm Myocar Multi W/spect W/wall Motion / Ef  05/15/2013   CLINICAL DATA:  Chest pain  EXAM: Lexiscan Myovue  TECHNIQUE: The patient received IV Lexiscan .4mg  over 15 seconds. 33.0 mCi of Technetium 67m Sestamibi injected at 30 seconds. Quantitative SPECT images were obtained in the vertical, horizontal and short axis planes after a 45 minute delay. Rest images were obtained with similar planes and delay using 10.2 mCi of Technetium 48m Sestamibi.  FINDINGS: ECG: SR, LVH with repolarization abnormalities, unchanged at stress. No ischemia.  Symptoms: SOB  RAW Data:  There is motion.  Normal lung to heart ratio.  Quantitative Gated SPECT EF:  LVEF 46%  Perfusion Images: There is evidence of some inferolateral ischemia. It is not a high risk scan. However it is not normal. Because this patient is young and we do not have a definitive diagnosis concerning his left ventricular dysfunction and because his nuclear scan is not completely normal, I feel that it would be appropriate at some time to proceed with diagnostic cardiac catheterization. I feel this is not urgent. Decisions about the timing for this can be based on his other ongoing workup.  IMPRESSION: Mild inferolateral ischemia. Mild LV dysfunction. A cardiac catheterization is recommended.  Dola Argyle, MD   Electronically Signed   By: Ena Dawley   On:  05/15/2013 19:27   US Abdomen Port  05/10/2013   CLINICAL DATA:  Diabetes. Hypertension. Renal failure. Morbid obesity .  EXAM: ULTRASOUND PORTABLE ABDOMEN  COMPARISON:  04/12/2013.  FINDINGS: Gallbladder:  Multiple mobile gallstones again noted. Gallbladder wall thickness normal at 1.8 mm. Negative Murphy sign. No pericholecystic fluid collection.  Common bile duct:  Diameter: 5.3 mm.  Liver:  Increased echogenicity in the liver consistent with fatty infiltration.  IVC:  No abnormality visualized.  Pancreas:  Visualized portion unremarkable.  Spleen:  Size and appearance within normal limits.  Right Kidney:  Length: Left 0.1 cm. Echogenic consistent with chronic medical renal disease.  Left Kidney:  Length: 10.3 cm. Echogenic  consistent chronic medical renal disease.  Abdominal aorta:  No aneurysm visualized.  Other findings:  None.  IMPRESSION: 1. Gallstones.  No evidence of cholecystitis. 2. Fatty infiltration liver. 3. Increased echogenicity noted both kidneys, this suggests chronic medical renal disease.   Electronically Signed   By: Marcello Moores  Register   On: 05/10/2013 12:19   Dg Chest Port 1 View  05/10/2013   CLINICAL DATA:  Rule out pneumothorax following catheter placement.  EXAM: PORTABLE CHEST - 1 VIEW  COMPARISON:  05/09/2013  FINDINGS: There has been interval placement of a right jugular approach dialysis catheter with tips overlying the lower SVC. No pneumothorax is identified. Lung volumes are mildly diminished compared to the prior exam with persistent pulmonary vascular congestion. Cardiac silhouette remains enlarged.  IMPRESSION: 1. Interval placement of right jugular dialysis catheter as above. No pneumothorax identified. 2. Persistent cardiomegaly and pulmonary vascular congestion.   Electronically Signed   By: Logan Bores   On: 05/10/2013 17:02   Dg Chest Port 1 View  05/09/2013   CLINICAL DATA:  Left upper chest pain. Shortness of breath. Cough and nausea.  EXAM: PORTABLE CHEST - 1  VIEW  COMPARISON:  Two-view chest 04/11/2013.  FINDINGS: The heart is enlarged. Mild pulmonary vascular congestion is evident. No focal airspace consolidation is present. The visualized soft tissues and bony thorax are unremarkable.  IMPRESSION: 1. Stable cardiomegaly with slight progression of pulmonary vascular congestion suggesting early congestive heart failure. 2. No focal airspace consolidation.   Electronically Signed   By: Lawrence Santiago M.D.   On: 05/09/2013 11:51   Dg Fluoro Guide Cv Line-no Report  05/10/2013   CLINICAL DATA: diatek placement   FLOURO GUIDE CV LINE  Fluoroscopy was utilized by the requesting physician.  No radiographic  interpretation.     Microbiology: Recent Results (from the past 240 hour(s))  MRSA PCR SCREENING     Status: None   Collection Time    05/09/13  4:00 PM      Result Value Range Status   MRSA by PCR NEGATIVE  NEGATIVE Final   Comment:            The GeneXpert MRSA Assay (FDA     approved for NASAL specimens     only), is one component of a     comprehensive MRSA colonization     surveillance program. It is not     intended to diagnose MRSA     infection nor to guide or     monitor treatment for     MRSA infections.     Labs: Basic Metabolic Panel:  Recent Labs Lab 05/10/13 1020 05/11/13 0440 05/13/13 0435 05/14/13 1622 05/15/13 0600 05/16/13 0515  NA  --  138 139 135 135 131*  K  --  4.2 3.4* 4.1 4.2 4.1  CL  --  105 100 98 99 97  CO2  --  17* 29 28 24 23   GLUCOSE  --  95 104* 101* 96 104*  BUN  --  82* 28* 19 25* 34*  CREATININE  --  6.26* 3.31* 2.90* 3.57* 4.25*  CALCIUM  --  7.9* 7.9* 8.3* 8.7 8.6  PHOS 5.0* 5.8*  --   --   --   --    Liver Function Tests:  Recent Labs Lab 05/11/13 0440 05/13/13 0435  AST  --  28  ALT  --  164*  ALKPHOS  --  137*  BILITOT  --  0.8  PROT  --  6.6  ALBUMIN 3.0* 3.0*   No results found for this basename: LIPASE, AMYLASE,  in the last 168 hours No results found for this basename:  AMMONIA,  in the last 168 hours CBC:  Recent Labs Lab 05/11/13 0421 05/13/13 0435 05/14/13 1622 05/15/13 0600 05/16/13 0515  WBC 11.5* 8.8 8.4 8.0 9.3  HGB 9.2* 9.2* 10.6* 10.1* 10.4*  HCT 27.4* 28.2* 32.6* 31.3* 31.8*  MCV 71.2* 70.9* 72.6* 72.6* 71.5*  PLT 193 139* 127* 126* 134*   Cardiac Enzymes: No results found for this basename: CKTOTAL, CKMB, CKMBINDEX, TROPONINI,  in the last 168 hours BNP: BNP (last 3 results)  Recent Labs  01/06/13 1847 01/24/13 0720 05/09/13 1122  PROBNP 61864.0* >70000.0* >70000.0*   CBG:  Recent Labs Lab 05/16/13 0640 05/16/13 1139 05/16/13 1409 05/16/13 2100 05/17/13 0710  GLUCAP 107* 105* 101* 119* 99       Signed:  Aja Whitehair  [facility] 05/17/2013, 9:08 AM

## 2013-05-17 ENCOUNTER — Encounter (HOSPITAL_COMMUNITY)
Admission: EM | Disposition: A | Discharge status: Home / Self Care | Payer: Self-pay | Source: Home / Self Care | Attending: Internal Medicine

## 2013-05-17 LAB — GLUCOSE, CAPILLARY: Glucose-Capillary: 99 mg/dL (ref 70–99)

## 2013-05-17 SURGERY — LEFT HEART CATHETERIZATION WITH CORONARY ANGIOGRAM
Anesthesia: LOCAL

## 2013-05-17 MED ORDER — LISINOPRIL 10 MG PO TABS: 10.0000 mg | ORAL_TABLET | Freq: Every day | ORAL | Status: DC

## 2013-05-17 NOTE — Progress Notes (Addendum)
I see plans for discharge home today. Patient has tolerated the initiation of dialysis well. Blood pressure is the best it has been. Patient is to arrive at the [facility] at 11 AM on December 24 for his next treatment. I have told him to not take hydralazine after discharge. We will look to taper blood pressure medications down even further as an outpatient now that he's been established on dialysis.  Ashkan Chamberland A

## 2013-05-17 NOTE — Progress Notes (Signed)
[facility]                                                                                Patient Demographics  Joshua [name], is a 32 y.o. male, DOB - 1981-04-07, [id number]  Admit date - 05/09/2013   Admitting Physician Joshua Fudge, MD  Outpatient Primary MD for the patient is Provider Not In System  LOS - 8   Chief Complaint  Patient presents with  . Nausea  . Hypertension        Assessment & Plan  Active Problems:   ? Diabetes   CKD (chronic kidney disease), stage V   Anemia in chronic kidney disease   Transaminitis   Acute on chronic combined systolic and diastolic CHF (congestive heart failure)   Noncompliance   Cholelithiases   Hypertensive emergency   Hypertensive cardiovascular disease  CKD (chronic kidney disease), stage V  -Nephrology consulted. -Hemodialysis has resumed and then arranged for as an outpatient by nephrology -Diatek catheter placed this admission; had a left upper extremity AV fistula placed in November but per vascular team not mature for use yet   Acute on chronic combined systolic and diastolic CHF (congestive heart failure)  -due to need for impending HD and poorly controlled HTN  -EF on ECHO Aug 2013: 30% with diffuse hypokinesis and severe LVH likely HTN etiology  -has never had ischemic evaluation -Stress/myoview found to be abnormal, pending actual report -cardiology consulted, feels patient should have cath -Cath 12/22  Hypertensive emergency  -due to volume overload and uncontrolled HTN  -Required labetalol infusion initially  -Coreg and lisinopril started by cardiology, will eventually titrate and wean off of clonidine and minoxidil -will discontinue hydralazine  ? Diabetes  -Hgb A1c was 5.6 in September OFF meds   Anemia of chronic kidney disease  -stable, H/H 10.1/31.3 -No active signs of bleeding, will continue to monitor CBC  Transaminitis/Cholelithiases  -elevated LFTs likely due to passive  hepato-congestion from heart failure -has cholelithiasis but [place] this admit without cholecystitis  -trending downward, will continue to monitor    Noncompliance  -Counseled, hemodialysis as an outpatient has been arranged for by nephrology  Code Status: Full  Family Communication: None at this time.  Patient does not wish for [place] to contact anyone.  Disposition Plan: Patient will be discharged today.  Please full discharge summary done 12/22.  Patient was not discharged yesterday as he had dialysis post cath which did not end until last night. Nephrology recommended monitoring through the night.     Procedures  Stress Test Ultrasound guided insertion of Diatek catheter 12/16 Cardiac cath  Consults   Cardiology Nephrology PCCM  DVT Prophylaxis  SCDs  Lab Results  Component Value Date   PLT 134* 05/16/2013    Medications  Scheduled Meds: . antiseptic oral rinse  15 mL Mouth Rinse BID  . aspirin  325 mg Oral Daily  . calcium acetate  667 mg Oral TID WC  . carvedilol  12.5 mg Oral BID WC  . cloNIDine  0.2 mg Oral TID  . darbepoetin (ARANESP) injection - DIALYSIS  60 mcg Intravenous Q Tue-HD  . doxercalciferol  2 mcg Intravenous Q M,W,F-HD  .  insulin aspart  0-15 Units Subcutaneous TID WC  . insulin aspart  0-5 Units Subcutaneous QHS  . lisinopril  10 mg Oral QHS  . minoxidil  5 mg Oral BID   Continuous Infusions: . labetalol (NORMODYNE) infusion Stopped (05/11/13 1100)   PRN Meds:.fentaNYL  Antibiotics   Anti-infectives   None       Time Spent in minutes   20 minutes   [name] D.O. on 05/17/2013 at 9:05 AM  Between 7am to 7pm - Pager - [phone]  After 7pm go to [email or web address] - password [email or web address]  And look for the night coverage person covering for me after hours  [facility] Office  [phone]    Subjective:   [name] seen and examined today in dialysis.   Patient has no complaints today. Patient is "ready to go  home".  Objective:   Filed Vitals:   05/16/13 2000 05/16/13 2030 05/16/13 2049 05/17/13 0610  BP: 123/75 131/81 124/83 146/80  Pulse: 82 83 83 74  Temp:   98.3 F (36.8 C) 97.8 F (36.6 C)  TempSrc:   Oral Oral  Resp: 23 17 15 18   Height:      Weight:   111.3 kg (245 lb 6 oz) 110.179 kg (242 lb 14.4 oz)  SpO2:   100% 100%    Wt Readings from Last 3 Encounters:  05/17/13 110.179 kg (242 lb 14.4 oz)  05/17/13 110.179 kg (242 lb 14.4 oz)  05/17/13 110.179 kg (242 lb 14.4 oz)     Intake/Output Summary (Last 24 hours) at 05/17/13 0905 Last data filed at 05/17/13 [id number]  Gross per 24 hour  Intake    240 ml  Output   2710 ml  Net  -2470 ml    Exam  General: Well developed, well nourished, NAD, appears stated age  HEENT: NCAT, PERRLA, mucous membranes moist.   Neck: Supple, no JVD, no masses  Cardiovascular: S1 S2 auscultated, no rubs, murmurs or gallops. Regular rate and rhythm.  Respiratory: Clear to auscultation bilaterally with equal chest rise  Abdomen: Soft, nontender, nondistended, + bowel sounds  Extremities: warm dry without cyanosis clubbing or edema  Neuro: AAOx3, cranial nerves grossly intact.   Skin: Without rashes exudates or nodules  Psych: Normal affect and demeanor with intact judgement and insight  Data Review   Micro Results Recent Results (from the past 240 hour(s))  MRSA PCR SCREENING     Status: None   Collection Time    05/09/13  4:00 PM      Result Value Range Status   MRSA by PCR NEGATIVE  NEGATIVE Final   Comment:            The GeneXpert MRSA Assay (FDA     approved for NASAL specimens     only), is one component of a     comprehensive MRSA colonization     surveillance program. It is not     intended to diagnose MRSA     infection nor to guide or     monitor treatment for     MRSA infections.    Radiology Reports US Abdomen Port  05/10/2013   CLINICAL DATA:  Diabetes. Hypertension. Renal failure. Morbid obesity .  EXAM:  ULTRASOUND PORTABLE ABDOMEN  COMPARISON:  04/12/2013.  FINDINGS: Gallbladder:  Multiple mobile gallstones again noted. Gallbladder wall thickness normal at 1.8 mm. Negative Murphy sign. No pericholecystic fluid collection.  Common bile duct:  Diameter: 5.3 mm.  Liver:  Increased echogenicity  in the liver consistent with fatty infiltration.  IVC:  No abnormality visualized.  Pancreas:  Visualized portion unremarkable.  Spleen:  Size and appearance within normal limits.  Right Kidney:  Length: Left 0.1 cm. Echogenic consistent with chronic medical renal disease.  Left Kidney:  Length: 10.3 cm. Echogenic consistent chronic medical renal disease.  Abdominal aorta:  No aneurysm visualized.  Other findings:  None.  IMPRESSION: 1. Gallstones.  No evidence of cholecystitis. 2. Fatty infiltration liver. 3. Increased echogenicity noted both kidneys, this suggests chronic medical renal disease.   Electronically Signed   By: Marcello Moores  Register   On: 05/10/2013 12:19   Dg Chest Port 1 View  05/10/2013   CLINICAL DATA:  Rule out pneumothorax following catheter placement.  EXAM: PORTABLE CHEST - 1 VIEW  COMPARISON:  05/09/2013  FINDINGS: There has been interval placement of a right jugular approach dialysis catheter with tips overlying the lower SVC. No pneumothorax is identified. Lung volumes are mildly diminished compared to the prior exam with persistent pulmonary vascular congestion. Cardiac silhouette remains enlarged.  IMPRESSION: 1. Interval placement of right jugular dialysis catheter as above. No pneumothorax identified. 2. Persistent cardiomegaly and pulmonary vascular congestion.   Electronically Signed   By: Logan Bores   On: 05/10/2013 17:02   Dg Chest Port 1 View  05/09/2013   CLINICAL DATA:  Left upper chest pain. Shortness of breath. Cough and nausea.  EXAM: PORTABLE CHEST - 1 VIEW  COMPARISON:  Two-view chest 04/11/2013.  FINDINGS: The heart is enlarged. Mild pulmonary vascular congestion is evident. No  focal airspace consolidation is present. The visualized soft tissues and bony thorax are unremarkable.  IMPRESSION: 1. Stable cardiomegaly with slight progression of pulmonary vascular congestion suggesting early congestive heart failure. 2. No focal airspace consolidation.   Electronically Signed   By: Lawrence Santiago M.D.   On: 05/09/2013 11:51   Dg Fluoro Guide Cv Line-no Report  05/10/2013   CLINICAL DATA: diatek placement   FLOURO GUIDE CV LINE  Fluoroscopy was utilized by the requesting physician.  No radiographic  interpretation.     CBC  Recent Labs Lab 05/11/13 0421 05/13/13 0435 05/14/13 1622 05/15/13 0600 05/16/13 0515  WBC 11.5* 8.8 8.4 8.0 9.3  HGB 9.2* 9.2* 10.6* 10.1* 10.4*  HCT 27.4* 28.2* 32.6* 31.3* 31.8*  PLT 193 139* 127* 126* 134*  MCV 71.2* 70.9* 72.6* 72.6* 71.5*  MCH 23.9* 23.1* 23.6* 23.4* 23.4*  MCHC 33.6 32.6 32.5 32.3 32.7  RDW 23.2* 23.0* 22.9* 22.5* 22.7*    Chemistries   Recent Labs Lab 05/11/13 0440 05/13/13 0435 05/14/13 1622 05/15/13 0600 05/16/13 0515  NA 138 139 135 135 131*  K 4.2 3.4* 4.1 4.2 4.1  CL 105 100 98 99 97  CO2 17* 29 28 24 23   GLUCOSE 95 104* 101* 96 104*  BUN 82* 28* 19 25* 34*  CREATININE 6.26* 3.31* 2.90* 3.57* 4.25*  CALCIUM 7.9* 7.9* 8.3* 8.7 8.6  AST  --  28  --   --   --   ALT  --  164*  --   --   --   ALKPHOS  --  137*  --   --   --   BILITOT  --  0.8  --   --   --    ------------------------------------------------------------------------------------------------------------------ estimated creatinine clearance is 32.1 ml/min (by C-G formula based on Cr of 4.25). ------------------------------------------------------------------------------------------------------------------ No results found for this basename: HGBA1C,  in the last 72 hours ------------------------------------------------------------------------------------------------------------------ No  results found for this basename: CHOL, HDL, LDLCALC,  TRIG, CHOLHDL, LDLDIRECT,  in the last 72 hours ------------------------------------------------------------------------------------------------------------------ No results found for this basename: TSH, T4TOTAL, FREET3, T3FREE, THYROIDAB,  in the last 72 hours ------------------------------------------------------------------------------------------------------------------ No results found for this basename: VITAMINB12, FOLATE, FERRITIN, TIBC, IRON, RETICCTPCT,  in the last 72 hours  Coagulation profile  Recent Labs Lab 05/16/13 0515  INR 1.22    No results found for this basename: DDIMER,  in the last 72 hours  Cardiac Enzymes No results found for this basename: CK, CKMB, TROPONINI, MYOGLOBIN,  in the last 168 hours ------------------------------------------------------------------------------------------------------------------ No components found with this basename: POCBNP,

## 2013-05-17 NOTE — Progress Notes (Signed)
Pt given discharge instructions, questions answered. D/c'd IV and telemetry.

## 2013-05-24 DIAGNOSIS — Z0279 Encounter for issue of other medical certificate: Secondary | ICD-10-CM

## 2013-05-31 ENCOUNTER — Encounter: Payer: Medicare Other | Admitting: Physician Assistant

## 2013-05-31 DIAGNOSIS — N186 End stage renal disease: Secondary | ICD-10-CM

## 2013-05-31 DIAGNOSIS — I5042 Chronic combined systolic (congestive) and diastolic (congestive) heart failure: Secondary | ICD-10-CM | POA: Insufficient documentation

## 2013-05-31 DIAGNOSIS — I428 Other cardiomyopathies: Secondary | ICD-10-CM | POA: Insufficient documentation

## 2013-05-31 DIAGNOSIS — I132 Hypertensive heart and chronic kidney disease with heart failure and with stage 5 chronic kidney disease, or end stage renal disease: Secondary | ICD-10-CM | POA: Insufficient documentation

## 2013-05-31 NOTE — Progress Notes (Deleted)
[facility] [address] [place], [place]  [address] Phone: [phone] Fax:  [phone]  Date:  05/31/2013   ID:  Clark Damuth, DOB 05-23-1981, MRN [id number]  PCP:  Provider Not In System  Cardiologist:  Dr. Loralie Champagne     History of Present Illness: Joshua [name] is a 33 y.o. male with a history of HTN, ESRD, T2DM, sleep apnea, anemia of chronic disease. He was admitted 12/2012 with hypertensive emergency and acute systolic CHF.  Echocardiogram (12/2012): Images of LV wall somewhat bright (consider amyloid), moderate to severe LVH, EF 30%, diffuse HK, diastolic dysfunction, mild AI, moderate to severe LAE, moderate to severe RVE, moderate to severely reduced RVSF, mild RAE, PASP 38, trivial effusion.  SPEP and UPEP were both negative for monoclonal light chains. Transthyretin amyloid could not be ruled out but overall suspicion was for cardiomyopathy secondary to uncontrolled hypertension.    He was admitted 12/15-12/23 with a/c combined systolic and diastolic CHF. He presented to the emergency room with weakness and was found to be in pulmonary edema. Dialysis was started with significant fluid removal over several days. He was followed by cardiology. Ischemic evaluation was undertaken. Myoview (05/15/13): Mild inferolateral ischemia. R./L. HC (05/15/2013):  PA 43/16 (mean 28), PCWP 15, CO2 0.4, CI 4.48, no angiographic CAD. Medical therapy was continued for NICM. Of note patient did have elevated LFTs thought to be secondary to passive congestion from heart failure. [place] did demonstrate gallstones. Hepatitis serologies were negative.  ***  Recent Labs: 01/25/2013: HDL 17*; LDL (calc) 69  04/12/2013: TSH 3.608  05/09/2013: Pro B Natriuretic peptide (BNP) >70000.0*  05/13/2013: ALT 164*  05/16/2013: Creatinine 4.25*; Hemoglobin 10.4*; Potassium 4.1   Wt Readings from Last 3 Encounters:  05/17/13 242 lb 14.4 oz (110.179 kg)  05/17/13 242 lb 14.4 oz (110.179 kg)  05/17/13 242 lb 14.4  oz (110.179 kg)     Past Medical History  Diagnosis Date  . Arthritis   . CKD (chronic kidney disease) stage 4, GFR 15-29 ml/min   . Hypertension     Hypertensive urgency/emergency - no RAS, normal aldo/PRA ratio,high urine metanephrines and normetanephrines   . Atrial fibrillation   . Type II diabetes mellitus   . History of bronchitis   . Sleep apnea     No CPAP  . Headache(784.0)     Regular  . Anemia of chronic disease     Current Outpatient Prescriptions  Medication Sig Dispense Refill  . acetaminophen (TYLENOL) 500 MG tablet Take 1,000 mg by mouth every 6 (six) hours as needed. For pain      . aspirin 325 MG tablet Take 325 mg by mouth daily.      . calcium acetate (PHOSLO) 667 MG capsule Take 1 capsule (667 mg total) by mouth 3 (three) times daily with meals.  90 capsule  0  . carvedilol (COREG) 12.5 MG tablet Take 1 tablet (12.5 mg total) by mouth 2 (two) times daily with a meal.  30 tablet  0  . cloNIDine (CATAPRES) 0.2 MG tablet Take 1 tablet (0.2 mg total) by mouth 3 (three) times daily.  90 tablet  0  . lisinopril (PRINIVIL,ZESTRIL) 10 MG tablet Take 1 tablet (10 mg total) by mouth at bedtime.  30 tablet  0  . minoxidil (LONITEN) 2.5 MG tablet Take 2 tablets (5 mg total) by mouth 2 (two) times daily.  120 tablet  0  . Multiple Vitamin (MULTIVITAMIN WITH MINERALS) TABS tablet Take 1 tablet by  mouth daily.      . ondansetron (ZOFRAN) 4 MG tablet Take 4 mg by mouth every 8 (eight) hours as needed for nausea or vomiting.      . sodium bicarbonate 650 MG tablet Take 1 tablet (650 mg total) by mouth 2 (two) times daily.  60 tablet  0   No current facility-administered medications for this visit.    Allergies:   Isosorbide dinitrate and Nitrates, organic   Social History:  The patient  reports that he quit smoking about 13 years ago. His smoking use included Cigarettes. He smoked 0.00 packs per day for .1 years. He has never used smokeless tobacco. He reports that he does  not drink alcohol or use illicit drugs.   Family History:  The patient's family history includes Diabetes in his father and maternal grandfather; Hypertension in his father, maternal grandfather, and maternal grandmother; Kidney disease in his maternal grandfather.   ROS:  Please see the history of present illness.   ***   All other systems reviewed and negative.   PHYSICAL EXAM: VS:  There were no vitals taken for this visit. Well nourished, well developed, in no acute distress HEENT: normal Neck: no JVD Cardiac:  normal S1, S2; RRR; no murmur Lungs:  clear to auscultation bilaterally, no wheezing, rhonchi or rales Abd: soft, nontender, no hepatomegaly Ext: no edema Skin: warm and dry Neuro:  CNs 2-12 intact, no focal abnormalities noted  EKG:  ***     ASSESSMENT AND PLAN:  1. Chronic Combined Systolic and Diastolic CHF:  *** 2. Non-Ischemic CM:  *** 3. Malignant Hypertension:  *** 4. ESRD:  *** 5. Disposition:  ***  Signed, Richardson Dopp, PA-C  05/31/2013 8:29 AM   This encounter was created in error - please disregard.

## 2013-06-01 ENCOUNTER — Encounter: Payer: Self-pay | Admitting: Vascular Surgery

## 2013-06-01 NOTE — Progress Notes (Signed)
This encounter was created in error - please disregard.

## 2013-06-02 ENCOUNTER — Other Ambulatory Visit (HOSPITAL_COMMUNITY): Payer: 59

## 2013-06-02 ENCOUNTER — Encounter: Payer: 59 | Admitting: Vascular Surgery

## 2013-06-14 ENCOUNTER — Encounter: Payer: Self-pay | Admitting: Physician Assistant

## 2013-07-26 DIAGNOSIS — N186 End stage renal disease: Secondary | ICD-10-CM | POA: Diagnosis not present

## 2013-07-26 DIAGNOSIS — E1129 Type 2 diabetes mellitus with other diabetic kidney complication: Secondary | ICD-10-CM | POA: Diagnosis not present

## 2013-07-26 DIAGNOSIS — N2581 Secondary hyperparathyroidism of renal origin: Secondary | ICD-10-CM | POA: Diagnosis not present

## 2013-07-26 DIAGNOSIS — Z23 Encounter for immunization: Secondary | ICD-10-CM | POA: Diagnosis not present

## 2013-07-26 DIAGNOSIS — D631 Anemia in chronic kidney disease: Secondary | ICD-10-CM | POA: Diagnosis not present

## 2013-07-26 DIAGNOSIS — D509 Iron deficiency anemia, unspecified: Secondary | ICD-10-CM | POA: Diagnosis not present

## 2013-07-26 DIAGNOSIS — N039 Chronic nephritic syndrome with unspecified morphologic changes: Secondary | ICD-10-CM | POA: Diagnosis not present

## 2013-08-23 DIAGNOSIS — N186 End stage renal disease: Secondary | ICD-10-CM | POA: Diagnosis not present

## 2013-08-24 DIAGNOSIS — D509 Iron deficiency anemia, unspecified: Secondary | ICD-10-CM | POA: Diagnosis not present

## 2013-08-24 DIAGNOSIS — E8779 Other fluid overload: Secondary | ICD-10-CM | POA: Diagnosis not present

## 2013-08-24 DIAGNOSIS — S91309A Unspecified open wound, unspecified foot, initial encounter: Secondary | ICD-10-CM | POA: Diagnosis not present

## 2013-08-24 DIAGNOSIS — D631 Anemia in chronic kidney disease: Secondary | ICD-10-CM | POA: Diagnosis not present

## 2013-08-24 DIAGNOSIS — Z23 Encounter for immunization: Secondary | ICD-10-CM | POA: Diagnosis not present

## 2013-08-24 DIAGNOSIS — E1129 Type 2 diabetes mellitus with other diabetic kidney complication: Secondary | ICD-10-CM | POA: Diagnosis not present

## 2013-08-24 DIAGNOSIS — E119 Type 2 diabetes mellitus without complications: Secondary | ICD-10-CM | POA: Diagnosis not present

## 2013-08-24 DIAGNOSIS — N2581 Secondary hyperparathyroidism of renal origin: Secondary | ICD-10-CM | POA: Diagnosis not present

## 2013-08-24 DIAGNOSIS — K7689 Other specified diseases of liver: Secondary | ICD-10-CM | POA: Diagnosis not present

## 2013-08-24 DIAGNOSIS — N186 End stage renal disease: Secondary | ICD-10-CM | POA: Diagnosis not present

## 2013-08-24 DIAGNOSIS — N039 Chronic nephritic syndrome with unspecified morphologic changes: Secondary | ICD-10-CM | POA: Diagnosis not present

## 2013-09-01 DIAGNOSIS — E1129 Type 2 diabetes mellitus with other diabetic kidney complication: Secondary | ICD-10-CM | POA: Diagnosis not present

## 2013-09-22 DIAGNOSIS — S91309A Unspecified open wound, unspecified foot, initial encounter: Secondary | ICD-10-CM | POA: Diagnosis not present

## 2013-09-22 DIAGNOSIS — N186 End stage renal disease: Secondary | ICD-10-CM | POA: Diagnosis not present

## 2013-09-24 DIAGNOSIS — E1129 Type 2 diabetes mellitus with other diabetic kidney complication: Secondary | ICD-10-CM | POA: Diagnosis not present

## 2013-09-24 DIAGNOSIS — N2581 Secondary hyperparathyroidism of renal origin: Secondary | ICD-10-CM | POA: Diagnosis not present

## 2013-09-24 DIAGNOSIS — K7689 Other specified diseases of liver: Secondary | ICD-10-CM | POA: Diagnosis not present

## 2013-09-24 DIAGNOSIS — B957 Other staphylococcus as the cause of diseases classified elsewhere: Secondary | ICD-10-CM | POA: Diagnosis not present

## 2013-09-24 DIAGNOSIS — D509 Iron deficiency anemia, unspecified: Secondary | ICD-10-CM | POA: Diagnosis not present

## 2013-09-24 DIAGNOSIS — N039 Chronic nephritic syndrome with unspecified morphologic changes: Secondary | ICD-10-CM | POA: Diagnosis not present

## 2013-09-24 DIAGNOSIS — D631 Anemia in chronic kidney disease: Secondary | ICD-10-CM | POA: Diagnosis not present

## 2013-09-24 DIAGNOSIS — N186 End stage renal disease: Secondary | ICD-10-CM | POA: Diagnosis not present

## 2013-09-27 ENCOUNTER — Other Ambulatory Visit: Payer: Self-pay

## 2013-09-28 ENCOUNTER — Ambulatory Visit (HOSPITAL_COMMUNITY)
Admission: RE | Admit: 2013-09-28 | Discharge: 2013-09-28 | Disposition: A | Discharge status: Home / Self Care | Payer: Medicare Other | Source: Ambulatory Visit | Attending: Vascular Surgery | Admitting: Vascular Surgery

## 2013-09-28 DIAGNOSIS — N186 End stage renal disease: Secondary | ICD-10-CM | POA: Insufficient documentation

## 2013-09-28 NOTE — Progress Notes (Signed)
[facility] SHORT STAY H&P  CC: ESRD   HPI: Joshua [name] is a 33 y.o. male who has been on HD through  functioning Hemodialysis access in the left upper extremity. They are here for HD catheter removal. Pt. denies signs of steal syndrome.  Past Medical History  Diagnosis Date  . Arthritis   . CKD (chronic kidney disease) stage 4, GFR 15-29 ml/min   . Hypertension     Hypertensive urgency/emergency - no RAS, normal aldo/PRA ratio,high urine metanephrines and normetanephrines   . Atrial fibrillation   . Type II diabetes mellitus   . History of bronchitis   . Sleep apnea     No CPAP  . Headache(784.0)     Regular  . Anemia of chronic disease     Family Hx Family History  Problem Relation Age of Onset  . Diabetes Father   . Hypertension Father   . Hypertension Maternal Grandmother   . Hypertension Maternal Grandfather   . Kidney disease Maternal Grandfather   . Diabetes Maternal Grandfather     Social HX History  Substance Use Topics  . Smoking status: Former Smoker -- .1 years    Types: Cigarettes    Quit date: 09/24/1999  . Smokeless tobacco: Never Used  . Alcohol Use: No    Allergies Allergies  Allergen Reactions  . Isosorbide Dinitrate     Severe headaches with po nitrates (imdur, isordil), do not prescribe  . Nitrates, Organic     Severe headaches with po nitrates (imdur, isordil, etc), do not prescribe    Medications Current Outpatient Prescriptions  Medication Sig Dispense Refill  . acetaminophen (TYLENOL) 500 MG tablet Take 1,000 mg by mouth every 6 (six) hours as needed. For pain      . aspirin 325 MG tablet Take 325 mg by mouth daily.      . calcium acetate (PHOSLO) 667 MG capsule Take 1 capsule (667 mg total) by mouth 3 (three) times daily with meals.  90 capsule  0  . carvedilol (COREG) 12.5 MG tablet Take 1 tablet (12.5 mg total) by mouth 2 (two) times daily with a meal.  30 tablet  0  . cloNIDine (CATAPRES) 0.2 MG tablet Take 1  tablet (0.2 mg total) by mouth 3 (three) times daily.  90 tablet  0  . lisinopril (PRINIVIL,ZESTRIL) 10 MG tablet Take 1 tablet (10 mg total) by mouth at bedtime.  30 tablet  0  . minoxidil (LONITEN) 2.5 MG tablet Take 2 tablets (5 mg total) by mouth 2 (two) times daily.  120 tablet  0  . Multiple Vitamin (MULTIVITAMIN WITH MINERALS) TABS tablet Take 1 tablet by mouth daily.      . ondansetron (ZOFRAN) 4 MG tablet Take 4 mg by mouth every 8 (eight) hours as needed for nausea or vomiting.      . sodium bicarbonate 650 MG tablet Take 1 tablet (650 mg total) by mouth 2 (two) times daily.  60 tablet  0   No current facility-administered medications for this encounter.    Labs COAG Lab Results  Component Value Date   INR 1.22 05/16/2013   INR 1.27 01/25/2013   No results found for this basename: PTT    PHYSICAL EXAM  There were no vitals filed for this visit.  General:  WDWN in NAD HENT: WNL Eyes: Pupils equal Pulmonary: normal non-labored breathing  Cardiac: RRR, Skin: normal, no cyanosis, jaundice, pallor or bruising Vascular Exam/Pulses: 2+  pulses in LEFT  upper extremity. Extremities without ischemic changes, no Gangrene , no cellulitis; no open wounds;   There is a good thrill and good bruit in the. Hand grip is 5/5 and sensation in digits is intact;   Impression: This is a 33 y.o. male who has a functioning HD access.  Plan: Removal of right IJ HD catheter [name] 09/28/2013 1:08 PM   [facility] Catheter Removal Procedure Note  Diagnosis: ESRD with Functioning AVF/AVGG  Plan:  Remove right diatek catheter  Consent signed:  yes Time out completed:  yes Coumadin:  no PT/INR (if applicable):   Other labs:   Procedure: 1.  Sterile prepping and draping over catheter area 2. 6 ml 2% lidocaine plain instilled at removal site. 3.  right catheter removed in its entirety with cuff in tact. 4.  Complications: none  5. Tip of catheter sent for  culture:  no   Patient tolerated procedure well:  yes Pressure held, no bleeding noted, dressing applied Instructions given to the pt regarding wound care and bleeding.  Other:  [name] 09/28/2013 1:08 PM

## 2013-09-28 NOTE — Progress Notes (Signed)
Pt's dressing post diatek removal CDI.  Pt d/c home

## 2013-10-10 ENCOUNTER — Encounter: Payer: Self-pay | Admitting: Physician Assistant

## 2013-10-10 ENCOUNTER — Ambulatory Visit (INDEPENDENT_AMBULATORY_CARE_PROVIDER_SITE_OTHER): Payer: Medicare Other | Admitting: Physician Assistant

## 2013-10-10 VITALS — BP 130/98 | HR 86 | Ht 71.5 in | Wt 273.2 lb

## 2013-10-10 DIAGNOSIS — I5042 Chronic combined systolic (congestive) and diastolic (congestive) heart failure: Secondary | ICD-10-CM | POA: Diagnosis not present

## 2013-10-10 DIAGNOSIS — N186 End stage renal disease: Secondary | ICD-10-CM

## 2013-10-10 DIAGNOSIS — I428 Other cardiomyopathies: Secondary | ICD-10-CM | POA: Diagnosis not present

## 2013-10-10 DIAGNOSIS — I498 Other specified cardiac arrhythmias: Secondary | ICD-10-CM | POA: Diagnosis not present

## 2013-10-10 DIAGNOSIS — I119 Hypertensive heart disease without heart failure: Secondary | ICD-10-CM

## 2013-10-10 DIAGNOSIS — R000 Tachycardia, unspecified: Secondary | ICD-10-CM

## 2013-10-10 LAB — TSH: TSH: 1.8 u[IU]/mL (ref 0.35–4.50)

## 2013-10-10 MED ORDER — HYDRALAZINE HCL 25 MG PO TABS: 25.0000 mg | ORAL_TABLET | Freq: Three times a day (TID) | ORAL | Status: DC

## 2013-10-10 NOTE — Progress Notes (Signed)
[address], [address], [place]  [address] Phone: [phone] Fax:  [phone]  Date:  10/10/2013   ID:  Joshua [name], DOB 1981/03/03, MRN [id number]  PCP:  PROVIDER NOT IN SYSTEM  Cardiologist:  Dr. Loralie Champagne      History of Present Illness: Joshua [name] is a 33 y.o. male with a hx of hypertensive heart disease, type 2 diabetes mellitus, chronic kidney disease stage IV, anemia of chronic disease and sleep apnea. He was admitted with acute combined systolic and diastolic CHF with an EF of 30% in 12/2012. He was noted to have some bright myocardium in the setting of moderate to severe LVH on echocardiogram.  Urine and serum immunofixation was negative for monoclonal light chains. Transthyretin amyloidosis could not be ruled out but it was overall felt uncontrolled hypertension was more likely the explanation for his cardiomyopathy.  He was admitted again in 04/2013 with weakness and found to be in pulmonary edema. He had progressed to ESRD and was started on hemodialysis. He was seen by cardiology.  Ischemic workup was pursued. Inpatient nuclear study demonstrated ischemia and he underwent cardiac catheterization. This demonstrated no significant CAD.  He was felt to have NICM.  This is his first visit back to our office in follow up.  He goes to [facility] on Tues, Thurs, Sat.  He is doing well.  He denies significant dyspnea.  He is NYHA 2-2b.  He denies chest pain, syncope, orthopnea, PND, edema.  His dialysis is sometimes cut short due to hypotension.  He is no longer on Lisinopril.  He is not certain why this was stopped.  He only takes 12.5 mg of Coreg the AM of dialysis.  He is sent back for follow up here by nephrology due to persistent tachycardia.  The patient tells me his dialysis has been stopped early on a couple occasions b/c of this.  I do not have any tele strips or ECGs for review.  He denies palpitations.     Studies:  - R/L HC (04/2013):  No CAD, RA 5, RV 44/3/8, PA  43/16 (mean 28), PCWP 15.  - Echo (12/2012):  Somewhat bright myocardium, moderate to severe LVH, EF 30%, diffuse HK, diastolic dysfunction, mild AI, moderate to severe LAE, moderate to severe RV, moderate to severe reduced RVSF, mild RAE, PASP 38 mm Hg.  - Nuclear (04/2013):  Mild inferolateral ischemia, EF 46%.  - Renal Artery [place] (09/2012): No evidence of renal artery stenosis.   Recent Labs: 01/25/2013: HDL Cholesterol by NMR 17*; LDL (calc) 69  04/12/2013: TSH 3.608  05/09/2013: Pro B Natriuretic peptide (BNP) >70000.0*  05/13/2013: ALT 164*  05/16/2013: Creatinine 4.25*; Hemoglobin 10.4*; Potassium 4.1   Wt Readings from Last 3 Encounters:  10/10/13 273 lb 3.2 oz (123.923 kg)  05/17/13 242 lb 14.4 oz (110.179 kg)  05/17/13 242 lb 14.4 oz (110.179 kg)     Past Medical History  Diagnosis Date  . Arthritis   . Type II diabetes mellitus   . Sleep apnea   . Headache(784.0)     Regular  . Anemia of chronic disease   . NICM (nonischemic cardiomyopathy)   . Chronic combined systolic and diastolic heart failure     a.  Echo (12/2012):  Somewhat bright myocardium, moderate to severe LVH, EF 30%, diffuse HK, diastolic dysfunction, mild AI, moderate to severe LAE, moderate to severe RV, moderate to severe reduced RVSF, mild RAE, PASP 38 mm Hg.  Marland Kitchen Hx of cardiac  catheterization     a. Nuclear (04/2013):  Mild inferolateral ischemia, EF 46%;  b. R/L HC (04/2013):  No CAD, RA 5, RV 44/3/8, PA 43/16 (mean 28), PCWP 15  . ESRD (end stage renal disease)     hemodialysis Tues, Thurs, Sat  . Hypertensive cardiovascular disease     Renal Artery [place] (09/2012): No evidence of renal artery stenosis    Current Outpatient Prescriptions  Medication Sig Dispense Refill  . acetaminophen (TYLENOL) 500 MG tablet Take 1,000 mg by mouth every 6 (six) hours as needed. For pain      . aspirin 325 MG tablet Take 325 mg by mouth daily.      . calcium acetate (PHOSLO) 667 MG capsule Take 1 capsule (667 mg  total) by mouth 3 (three) times daily with meals.  90 capsule  0  . carvedilol (COREG) 12.5 MG tablet Take 25 mg by mouth 2 (two) times daily with a meal. Take two tablets twice daily with a meal      . minoxidil (LONITEN) 2.5 MG tablet Take 2 tablets (5 mg total) by mouth 2 (two) times daily.  120 tablet  0  . Multiple Vitamin (MULTIVITAMIN WITH MINERALS) TABS tablet Take 1 tablet by mouth daily.       No current facility-administered medications for this visit.    Allergies:   Isosorbide dinitrate and Nitrates, organic   Social History:  The patient  reports that he quit smoking about 14 years ago. His smoking use included Cigarettes. He smoked 0.00 packs per day for .1 years. He has never used smokeless tobacco. He reports that he does not drink alcohol or use illicit drugs.   Family History:  The patient's family history includes Diabetes in his father and maternal grandfather; Hypertension in his father, maternal grandfather, and maternal grandmother; Kidney disease in his maternal grandfather.   ROS:  Please see the history of present illness.      All other systems reviewed and negative.   PHYSICAL EXAM: VS:  BP 130/98  Pulse 86  Ht 5' 11.5" (1.816 m)  Wt 273 lb 3.2 oz (123.923 kg)  BMI 37.58 kg/m2 Well nourished, well developed, in no acute distress HEENT: normal Neck: no JVD Cardiac:  normal S1, S2; RRR; no murmur Lungs:  clear to auscultation bilaterally, no wheezing, rhonchi or rales Abd: soft, nontender, no hepatomegaly Ext: no edema Skin: warm and dry Neuro:  CNs 2-12 intact, no focal abnormalities noted  EKG:  NSR, HR 86, normal axis, QTc 504 ms, no change from prior tracing     ASSESSMENT AND PLAN:  1. Chronic combined systolic and diastolic heart failure:  Volume managed by dialysis.  He is NYHA 2-2b.  2. NICM (nonischemic cardiomyopathy):  Likely 2/2 to uncontrolled HTN.  Cardiac cath without significant CAD.  Continue beta blocker.  He was taken off of ACEI by  nephrology.  It would be good to get him off of Minoxidil.  I will decrease this to Minoxidil 2.5 mg bid.  Start Hydralazine 25 mg TID.  He can hold this AM of dialysis.  He had intol to nitrates in the past due to HAs.  So, we will not likely be able to use this again.  Overall, I suspect his BP is better controlled now than it was prior to dialysis starting.  I will get a follow up echo now to reassess his LVF.   3. Hypertensive cardiovascular disease:  BP above target today.  However, his  BP tends to drop with dialysis.  Adjust medications as above for NICM. 4. ESRD (end stage renal disease):  He remains on Tues, Thurs, Sat dialysis. 5. Sinus tachycardia:  Etiology of this not certain.  I will arrange echo as noted above.  I will obtain an event monitor to better characterize his rhythm (frequency of his tachycardia is infrequent and a Holter monitor will not be adequate).  Obtain TSH.   6. Disposition:  He saw Dr. Domenic Polite, Dr. Harrington Challenger, Dr. Aundra Dubin, Dr Harl Bowie and Dr. Angelena Form in the hospital.  Overall, I think it makes the most sense for him to see Dr. Aundra Dubin as his primary problem is combined systolic and diastolic CHF in the setting of NICM and Hypertensive Heart Disease.  Consider following in the [facility] long term. Will currently plan on f/u here in 4-6 weeks with Dr. Aundra Dubin.  Signed, Richardson Dopp, PA-C  10/10/2013 3:01 PM

## 2013-10-10 NOTE — Patient Instructions (Signed)
DECREASE MINOXIDIL TO 2.5 MG TWICE DAILY  START HYDRALAZINE 25 MG 1 TABLET 3 TIMES DAILY (THIS IS OK TO HOLD ON DIALYSIS DAYS)  LAB WORK TODAY; TSH  Your physician has requested that you have an echocardiogram. Echocardiography is a painless test that uses sound waves to create images of your heart. It provides your doctor with information about the size and shape of your heart and how well your heart's chambers and valves are working. This procedure takes approximately one hour. There are no restrictions for this procedure.  Your physician has recommended that you wear an event monitor. Event monitors are medical devices that record the heart's electrical activity. Doctors most often [place] these monitors to diagnose arrhythmias. Arrhythmias are problems with the speed or rhythm of the heartbeat. The monitor is a small, portable device. You can wear one while you do your normal daily activities. This is usually used to diagnose what is causing palpitations/syncope (passing out).  Your physician recommends that you schedule a follow-up appointment in: [place]. Aundra Dubin

## 2013-10-11 ENCOUNTER — Telehealth: Payer: Self-pay | Admitting: *Deleted

## 2013-10-11 NOTE — Telephone Encounter (Signed)
could not lmptcb voice mail box full

## 2013-10-11 NOTE — Telephone Encounter (Signed)
lvm with family member to have ptcb for lab results

## 2013-10-12 ENCOUNTER — Encounter: Payer: Self-pay | Admitting: *Deleted

## 2013-10-12 NOTE — Telephone Encounter (Signed)
could not lmom voice mail box full. I will send out results letter, TSH normal.

## 2013-10-12 NOTE — Progress Notes (Signed)
Patient ID: Joshua [name], male   DOB: 14-Sep-1980, 33 y.o.   MRN: [id number] Patient did not show up for 10/12/2013 appt.Marland Kitchen at  9:30 am to have a cardiac event monitor applied.

## 2013-10-14 ENCOUNTER — Ambulatory Visit: Payer: Self-pay | Admitting: Podiatrist

## 2013-10-19 ENCOUNTER — Encounter: Payer: Self-pay | Admitting: Podiatry

## 2013-10-19 ENCOUNTER — Ambulatory Visit (INDEPENDENT_AMBULATORY_CARE_PROVIDER_SITE_OTHER): Payer: Medicare Other | Admitting: Podiatry

## 2013-10-19 VITALS — BP 146/102 | HR 95 | Resp 16

## 2013-10-19 DIAGNOSIS — L97509 Non-pressure chronic ulcer of other part of unspecified foot with unspecified severity: Secondary | ICD-10-CM | POA: Diagnosis not present

## 2013-10-19 NOTE — Progress Notes (Signed)
Subjective:     Patient ID: Joshua [name], male   DOB: 01-16-1981, 33 y.o.   MRN: [id number]  HPI patient is in poor health and presents with a abrasion of the dorsum of the right foot he is scared might be ulcer   Review of Systems  All other systems reviewed and are negative.      Objective:   Physical Exam  Nursing note and vitals reviewed. Constitutional: He is oriented to person, place, and time.  Cardiovascular: Intact distal pulses.   Musculoskeletal: Normal range of motion.  Neurological: He is oriented to person, place, and time.  Skin: Skin is dry.   neurovascular status was found to be intact with patient having diminished range of motion subtalar midtarsal joint and normal muscle strength. Dorsum of the right foot showed an approximate 3 cm x 2 cm crusted area that is localized with no proximal erythema edema or drainage noted     Assessment:     Localized abrasion right with no indications of proximal infection or abscess    Plan:     H&P and diabetic education rendered. Discussed allowing this to scab over it should be healthy skin underneath and if any drainage swelling or other issues should occur to let us know immediately

## 2013-10-19 NOTE — Patient Instructions (Signed)
Diabetes and Foot Care Diabetes may cause you to have problems because of poor blood supply (circulation) to your feet and legs. This may cause the skin on your feet to become thinner, break easier, and heal more slowly. Your skin may become dry, and the skin may peel and crack. You may also have nerve damage in your legs and feet causing decreased feeling in them. You may not notice minor injuries to your feet that could lead to infections or more serious problems. Taking care of your feet is one of the most important things you can do for yourself.  HOME CARE INSTRUCTIONS  Wear shoes at all times, even in the house. Do not go barefoot. Bare feet are easily injured.  Check your feet daily for blisters, cuts, and redness. If you cannot see the bottom of your feet, use a mirror or ask someone for help.  Wash your feet with warm water (do not use hot water) and mild soap. Then pat your feet and the areas between your toes until they are completely dry. Do not soak your feet as this can dry your skin.  Apply a moisturizing lotion or petroleum jelly (that does not contain alcohol and is unscented) to the skin on your feet and to dry, brittle toenails. Do not apply lotion between your toes.  Trim your toenails straight across. Do not dig under them or around the cuticle. File the edges of your nails with an emery board or nail file.  Do not cut corns or calluses or try to remove them with medicine.  Wear clean socks or stockings every day. Make sure they are not too tight. Do not wear knee-high stockings since they may decrease blood flow to your legs.  Wear shoes that fit properly and have enough cushioning. To break in new shoes, wear them for just a few hours a day. This prevents you from injuring your feet. Always look in your shoes before you put them on to be sure there are no objects inside.  Do not cross your legs. This may decrease the blood flow to your feet.  If you find a minor scrape,  cut, or break in the skin on your feet, keep it and the skin around it clean and dry. These areas may be cleansed with mild soap and water. Do not cleanse the area with peroxide, alcohol, or iodine.  When you remove an adhesive bandage, be sure not to damage the skin around it.  If you have a wound, look at it several times a day to make sure it is healing.  Do not use heating pads or hot water bottles. They may burn your skin. If you have lost feeling in your feet or legs, you may not know it is happening until it is too late.  Make sure your health care provider performs a complete foot exam at least annually or more often if you have foot problems. Report any cuts, sores, or bruises to your health care provider immediately. SEEK MEDICAL CARE IF:   You have an injury that is not healing.  You have cuts or breaks in the skin.  You have an ingrown nail.  You notice redness on your legs or feet.  You feel burning or tingling in your legs or feet.  You have pain or cramps in your legs and feet.  Your legs or feet are numb.  Your feet always feel cold. SEEK IMMEDIATE MEDICAL CARE IF:   There is increasing redness,   swelling, or pain in or around a wound.  There is a red line that goes up your leg.  Pus is coming from a wound.  You develop a fever or as directed by your health care provider.  You notice a bad smell coming from an ulcer or wound. Document Released: 05/09/2000 Document Revised: 01/12/2013 Document Reviewed: 10/19/2012 [employer] Patient Information 2014 [employer].  

## 2013-10-19 NOTE — Progress Notes (Signed)
   Subjective:    Patient ID: Joshua [name], male    DOB: Feb 15, 1981, 33 y.o.   MRN: [id number]  HPI Comments: "I hit my foot and it made a place on my foot"  Patient c/o of open wound forefoot right for about 2 months. He says that he bumped his foot and scraped the skin off. The area hasn't healed and is becoming tender. He has been using antibiotic ointment and band aid.     Review of Systems  Skin: Positive for wound.  Neurological: Positive for weakness and light-headedness.  Hematological:       Slow to heal  All other systems reviewed and are negative.      Objective:   Physical Exam        Assessment & Plan:

## 2013-10-23 DIAGNOSIS — N186 End stage renal disease: Secondary | ICD-10-CM | POA: Diagnosis not present

## 2013-10-25 DIAGNOSIS — E1129 Type 2 diabetes mellitus with other diabetic kidney complication: Secondary | ICD-10-CM | POA: Diagnosis not present

## 2013-10-25 DIAGNOSIS — N2581 Secondary hyperparathyroidism of renal origin: Secondary | ICD-10-CM | POA: Diagnosis not present

## 2013-10-25 DIAGNOSIS — K7689 Other specified diseases of liver: Secondary | ICD-10-CM | POA: Diagnosis not present

## 2013-10-25 DIAGNOSIS — D631 Anemia in chronic kidney disease: Secondary | ICD-10-CM | POA: Diagnosis not present

## 2013-10-25 DIAGNOSIS — N039 Chronic nephritic syndrome with unspecified morphologic changes: Secondary | ICD-10-CM | POA: Diagnosis not present

## 2013-10-25 DIAGNOSIS — N186 End stage renal disease: Secondary | ICD-10-CM | POA: Diagnosis not present

## 2013-10-25 DIAGNOSIS — D509 Iron deficiency anemia, unspecified: Secondary | ICD-10-CM | POA: Diagnosis not present

## 2013-10-26 ENCOUNTER — Other Ambulatory Visit (HOSPITAL_COMMUNITY): Payer: Medicare Other

## 2013-11-22 DIAGNOSIS — N186 End stage renal disease: Secondary | ICD-10-CM | POA: Diagnosis not present

## 2013-11-24 DIAGNOSIS — K7689 Other specified diseases of liver: Secondary | ICD-10-CM | POA: Diagnosis not present

## 2013-11-24 DIAGNOSIS — Z23 Encounter for immunization: Secondary | ICD-10-CM | POA: Diagnosis not present

## 2013-11-24 DIAGNOSIS — D509 Iron deficiency anemia, unspecified: Secondary | ICD-10-CM | POA: Diagnosis not present

## 2013-11-24 DIAGNOSIS — N186 End stage renal disease: Secondary | ICD-10-CM | POA: Diagnosis not present

## 2013-11-24 DIAGNOSIS — E1129 Type 2 diabetes mellitus with other diabetic kidney complication: Secondary | ICD-10-CM | POA: Diagnosis not present

## 2013-11-24 DIAGNOSIS — D631 Anemia in chronic kidney disease: Secondary | ICD-10-CM | POA: Diagnosis not present

## 2013-11-24 DIAGNOSIS — N2581 Secondary hyperparathyroidism of renal origin: Secondary | ICD-10-CM | POA: Diagnosis not present

## 2013-12-01 DIAGNOSIS — E1129 Type 2 diabetes mellitus with other diabetic kidney complication: Secondary | ICD-10-CM | POA: Diagnosis not present

## 2013-12-23 DIAGNOSIS — N186 End stage renal disease: Secondary | ICD-10-CM | POA: Diagnosis not present

## 2013-12-24 DIAGNOSIS — D509 Iron deficiency anemia, unspecified: Secondary | ICD-10-CM | POA: Diagnosis not present

## 2013-12-24 DIAGNOSIS — N186 End stage renal disease: Secondary | ICD-10-CM | POA: Diagnosis not present

## 2013-12-24 DIAGNOSIS — K7689 Other specified diseases of liver: Secondary | ICD-10-CM | POA: Diagnosis not present

## 2013-12-24 DIAGNOSIS — N2581 Secondary hyperparathyroidism of renal origin: Secondary | ICD-10-CM | POA: Diagnosis not present

## 2013-12-24 DIAGNOSIS — E1129 Type 2 diabetes mellitus with other diabetic kidney complication: Secondary | ICD-10-CM | POA: Diagnosis not present

## 2014-01-13 IMAGING — CT CT HEAD W/O CM
2 series · 16 of 30 positions shown, 20 images · non-contrast
Comparison: 09/26/2012

CLINICAL DATA: Hypertension.  Headache.  Nausea and vomiting.

CT HEAD WITHOUT CONTRAST
TECHNIQUE: Contiguous axial images were obtained from the base of
the skull through the vertex without contrast.

[Series 2: head w/o · axial · non-contrast · 0.48mm/px · z∈[-198,-53]mm · 13 of 35 slices shown, 17 images]
[im 3/35  brain]
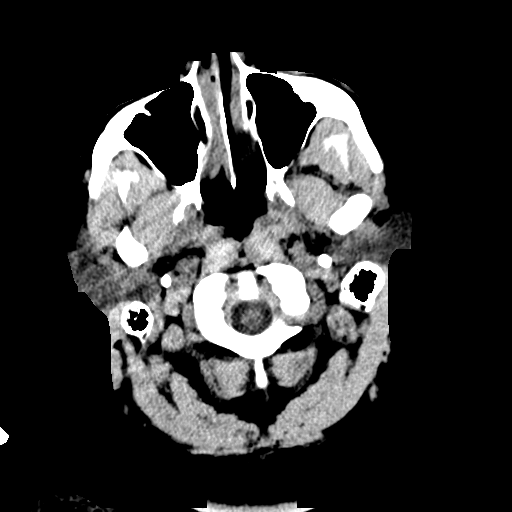
[im 3/35  bone]
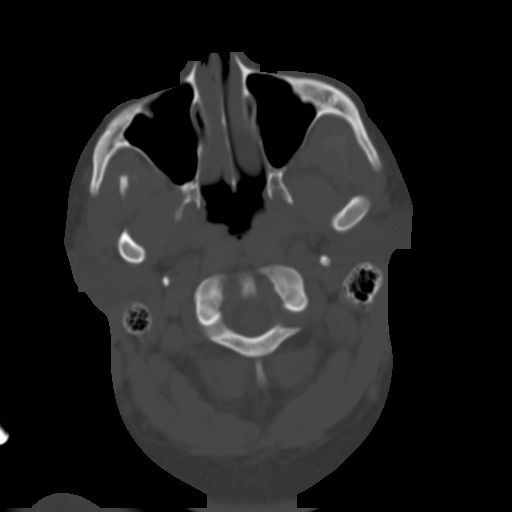
[im 5/35  brain]
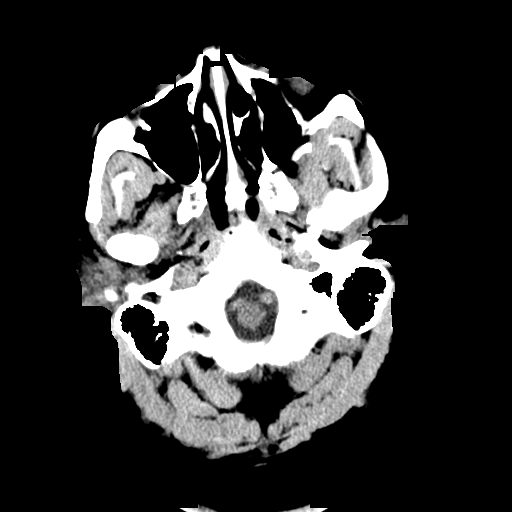
[im 8/35  brain]
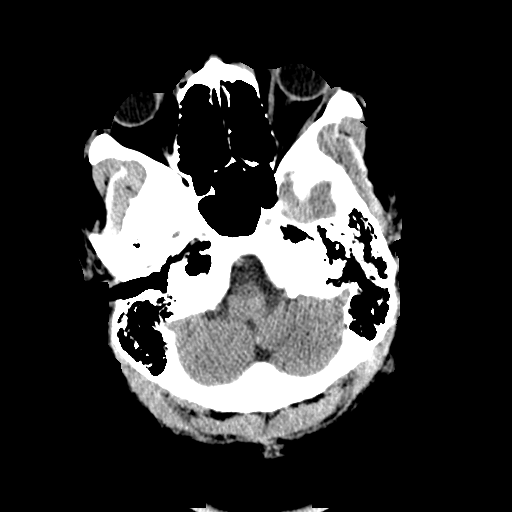
[im 10/35  brain]
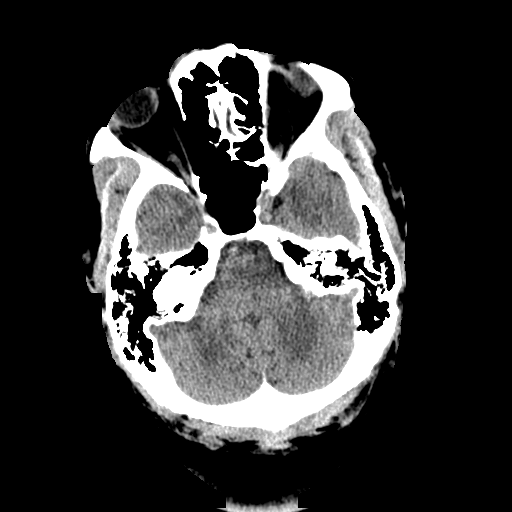
[im 13/35  brain]
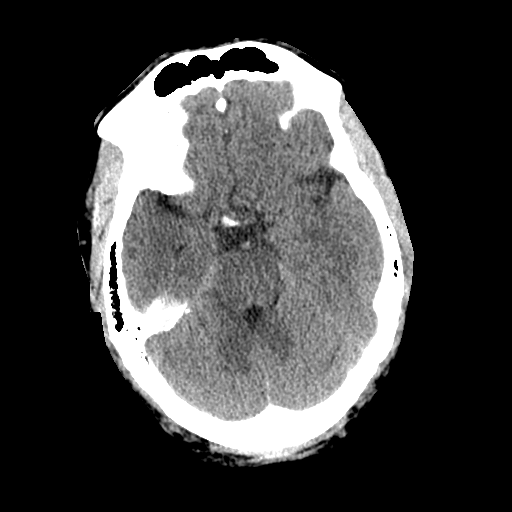
[im 13/35  bone]
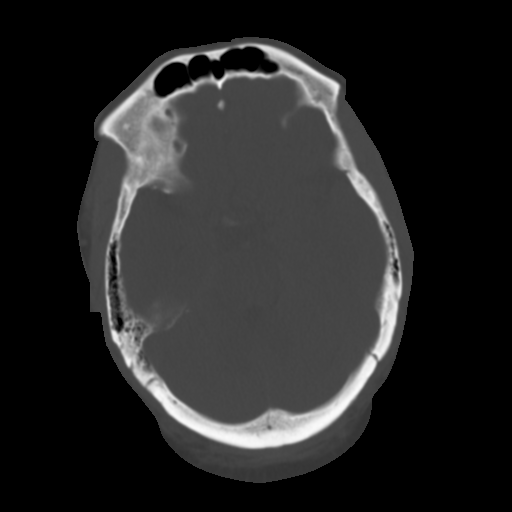
[im 15/35  brain]
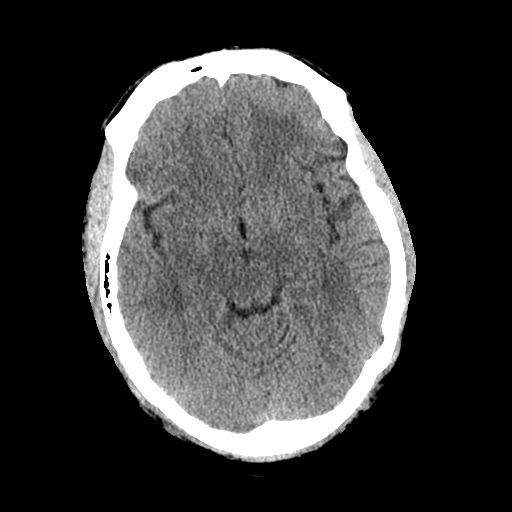
[im 18/35  brain]
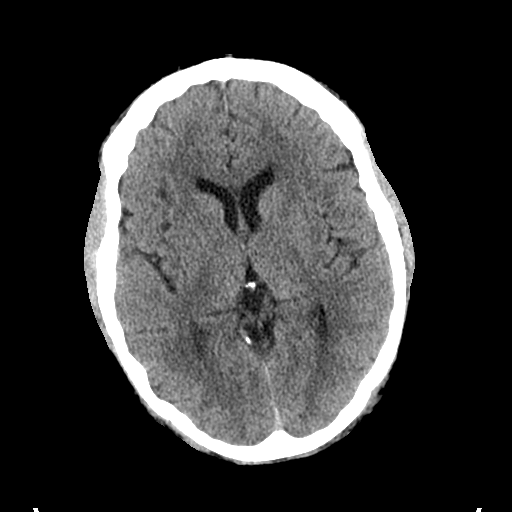
[im 20/35  brain]
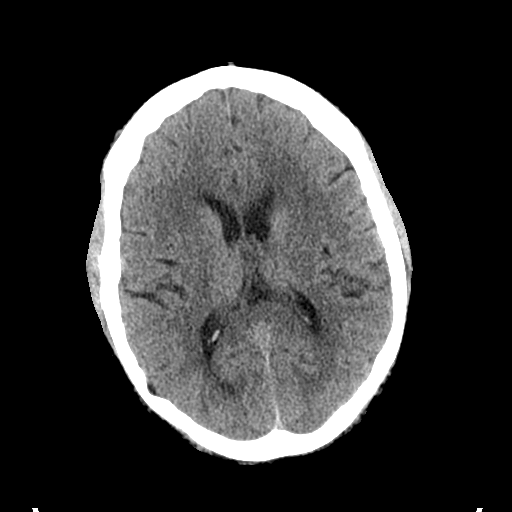
[im 22/35  brain]
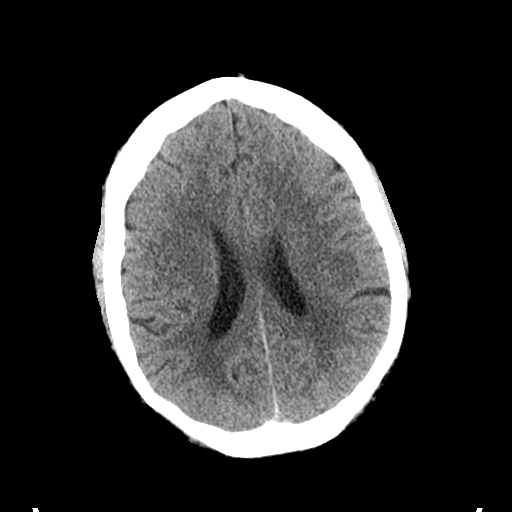
[im 22/35  bone]
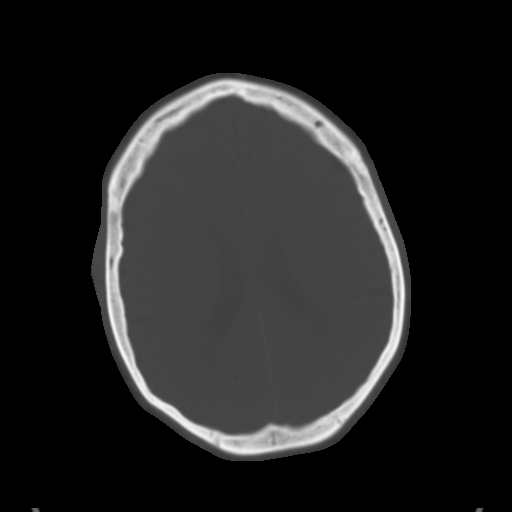
[im 25/35  brain]
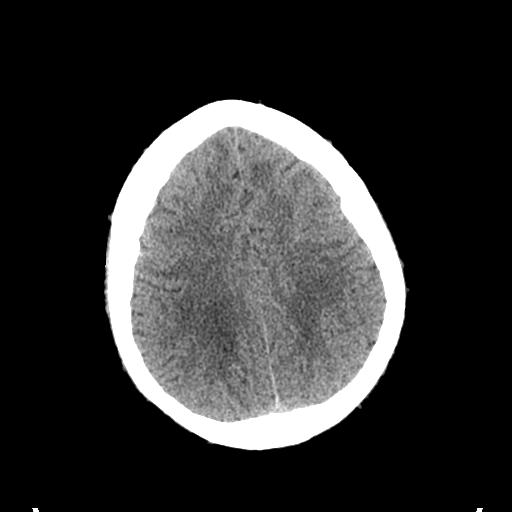
[im 27/35  brain]
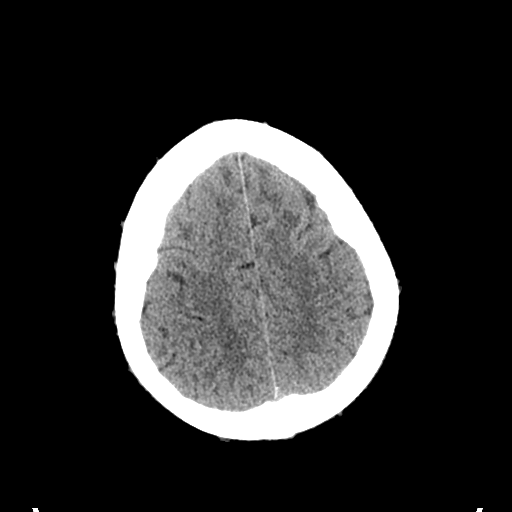
[im 30/35  brain]
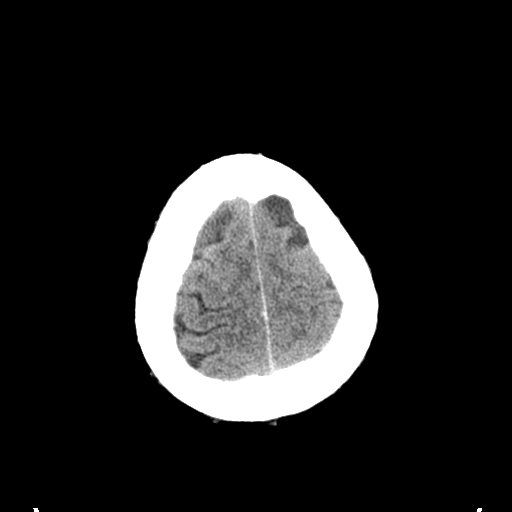
[im 32/35  brain]
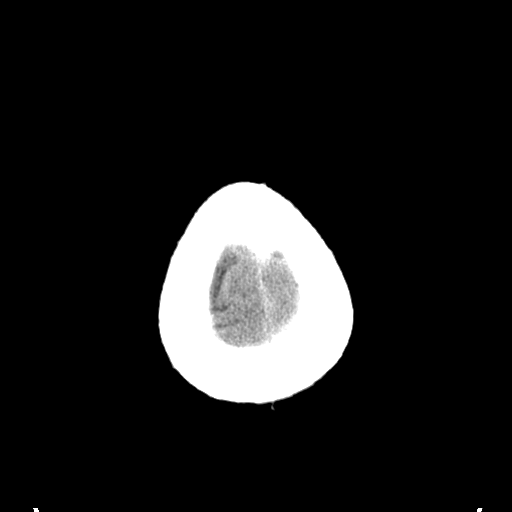
[im 32/35  bone]
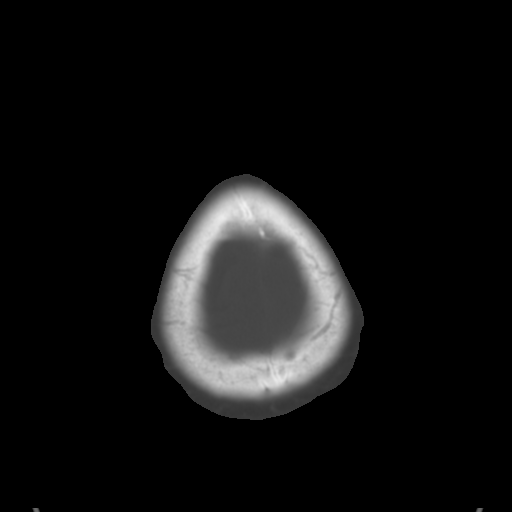

[Series 3: bone windows · axial · 0.48mm/px · z∈[-198,-148]mm · 3 of 35 slices shown]
[im 3/35  bone]
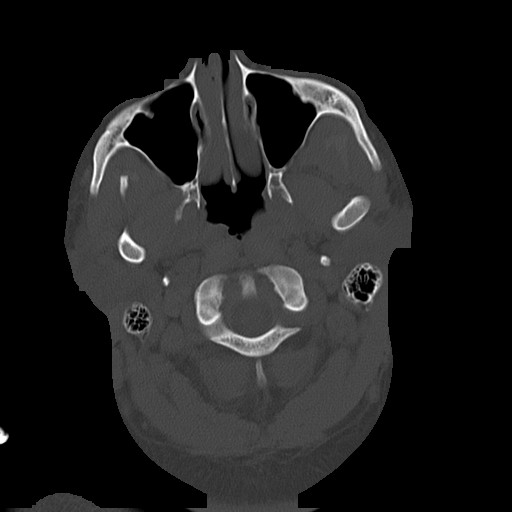
[im 8/35  bone]
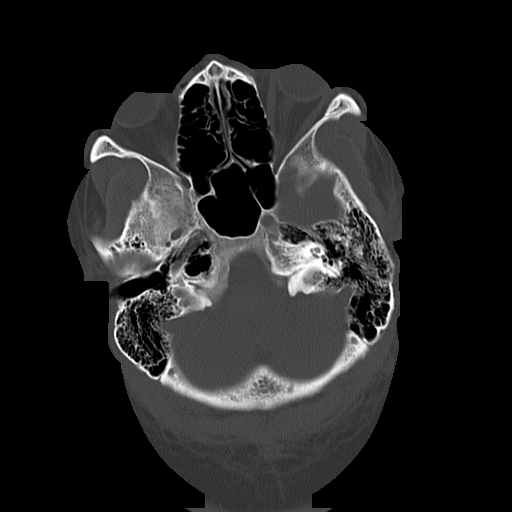
[im 13/35  bone]
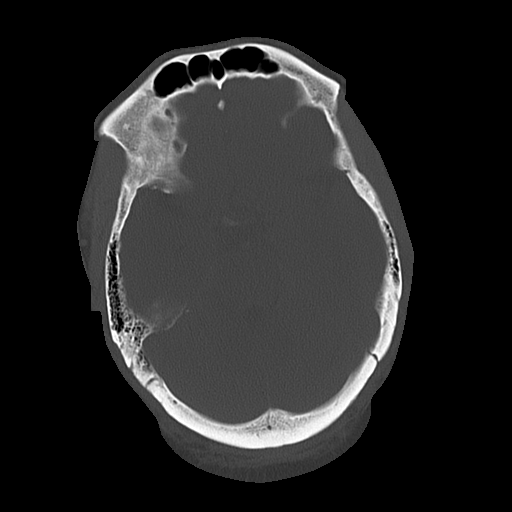

[16 of 30 positions shown; findings below may reference images not displayed]

FINDINGS: Vague appear low attenuation change in the deep white
matter of the posterior parietal regions bilaterally, similar to
previous study.  Ventricles and sulci are symmetrical.  No mass
effect or midline shift.  No abnormal extra-axial fluid
collections.  Gray-white matter junctions are distinct.  Basal
cisterns are not effaced.  No evidence of acute intracranial
hemorrhage.  No depressed skull fractures.  Visualized paranasal
sinuses and mastoid air cells are not opacified.
IMPRESSION: Vague posterior parietal white matter changes are stable since
previous study.  No acute intracranial abnormalities demonstrated.

## 2014-01-13 IMAGING — US US ABDOMEN COMPLETE
1 series · 14 of 25 positions shown · non-contrast
Comparison: 01/24/2013

CLINICAL DATA: Hypertension.  Abdominal pain.  Nausea and
vomiting.

COMPLETE ABDOMINAL ULTRASOUND

[Series 1: us abdomen complete · 0.29mm/px · 14 of 85 slices shown]
[im 1/85]
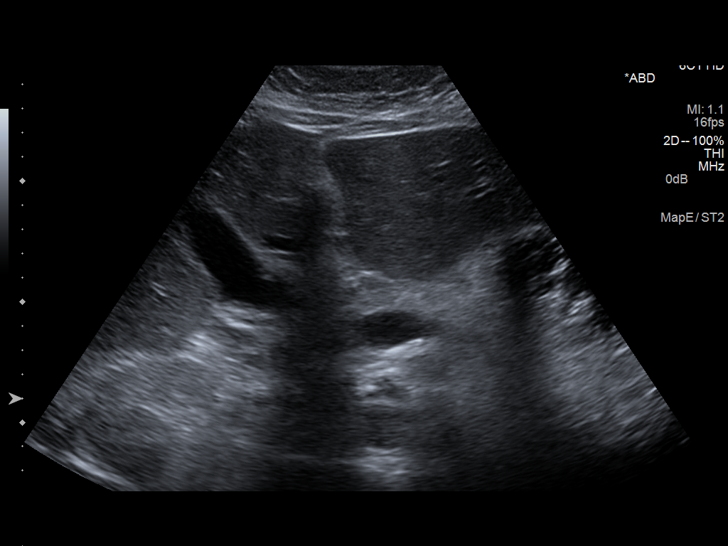
[im 8/85]
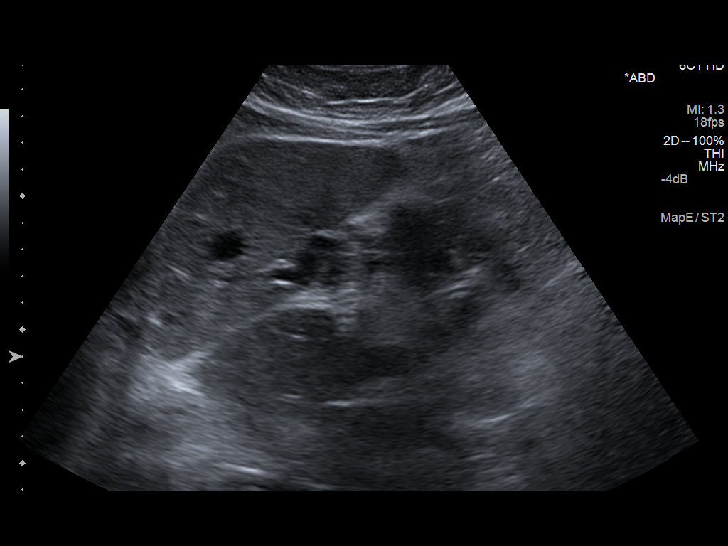
[im 15/85]
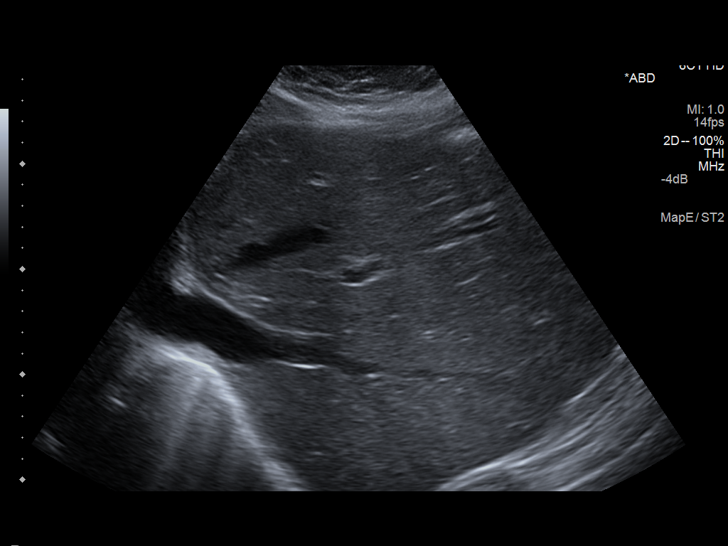
[im 22/85]
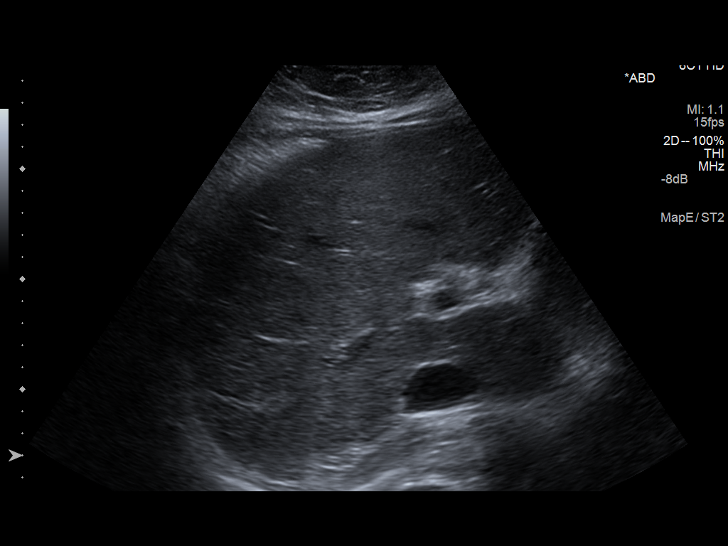
[im 29/85]
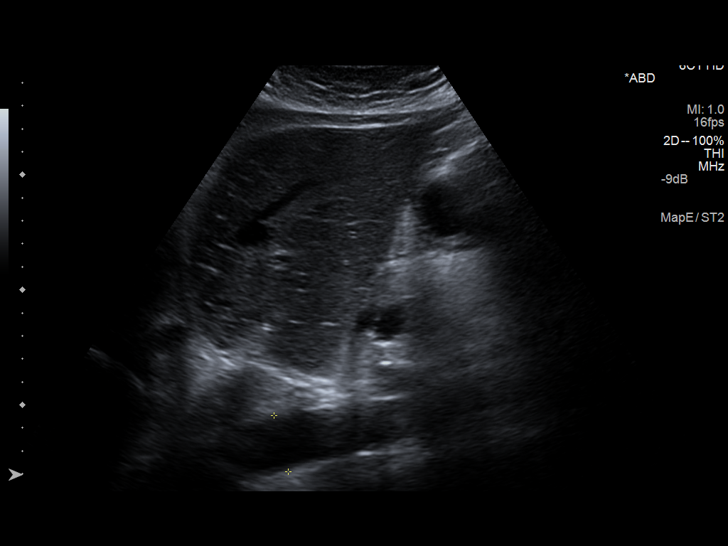
[im 32/85]
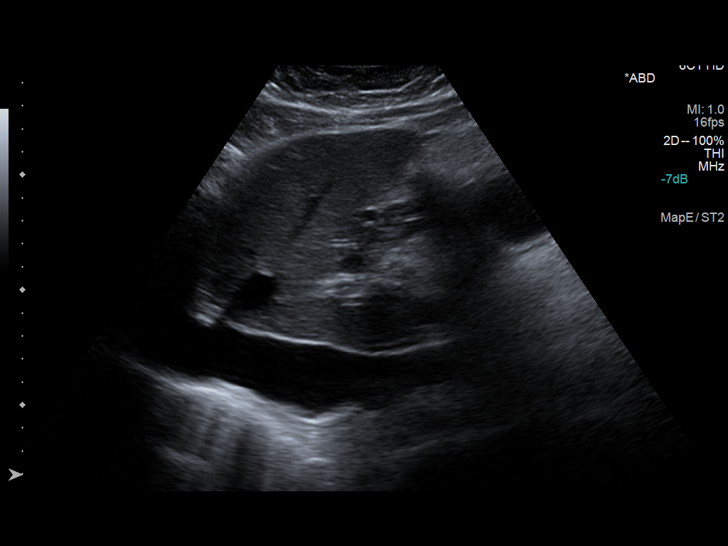
[im 39/85]
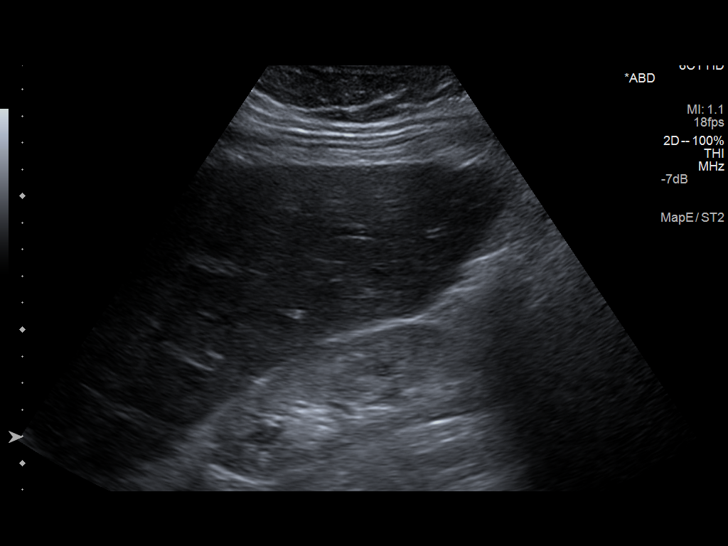
[im 46/85]
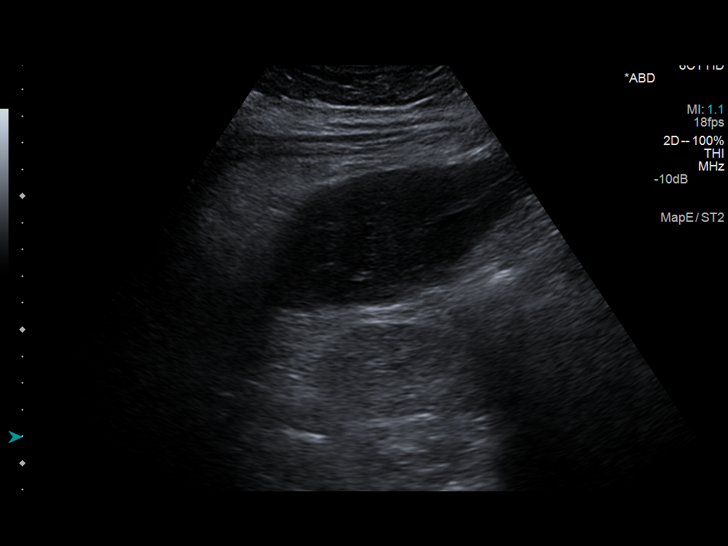
[im 53/85]
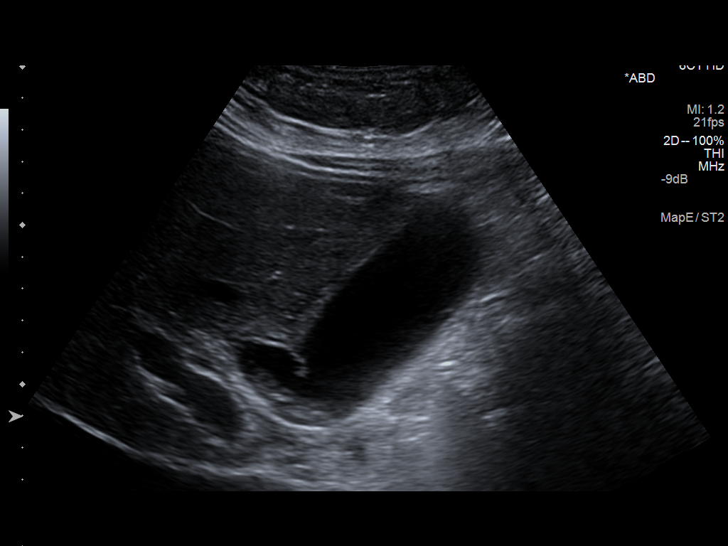
[im 57/85]
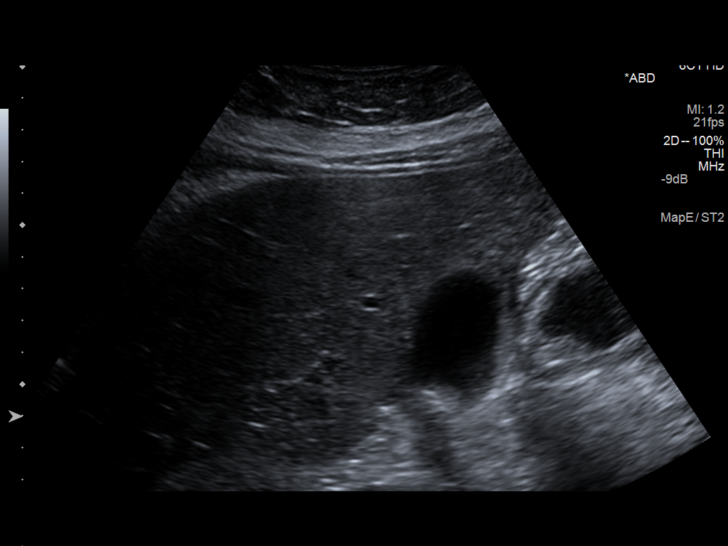
[im 64/85]
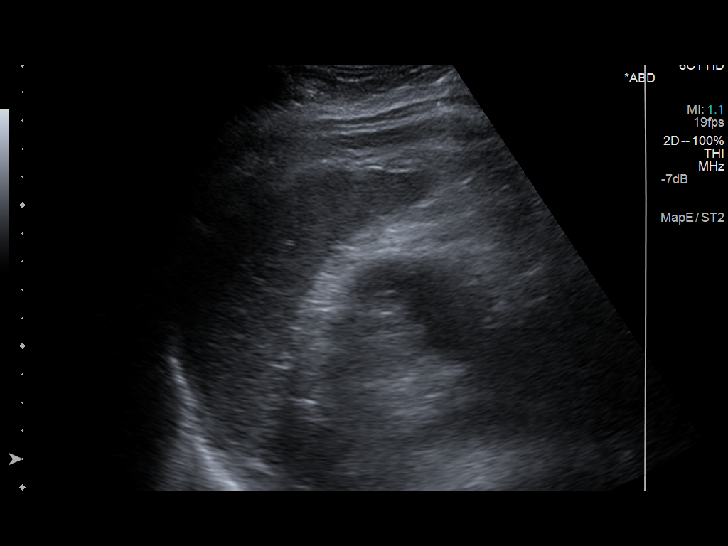
[im 71/85]
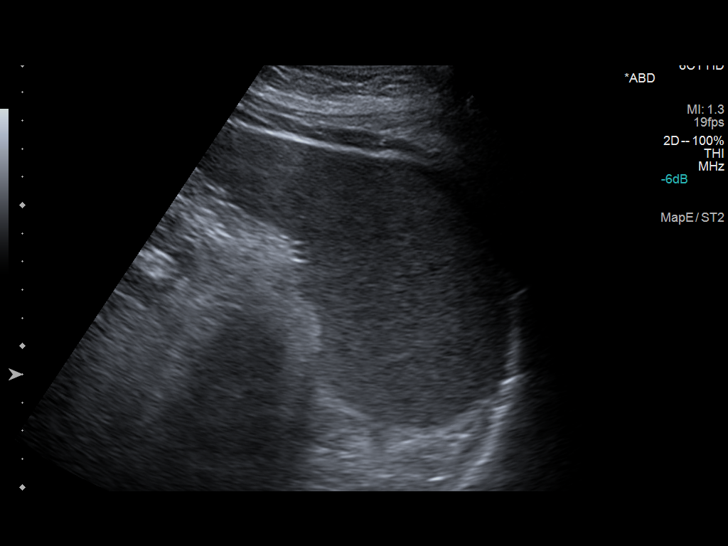
[im 78/85]
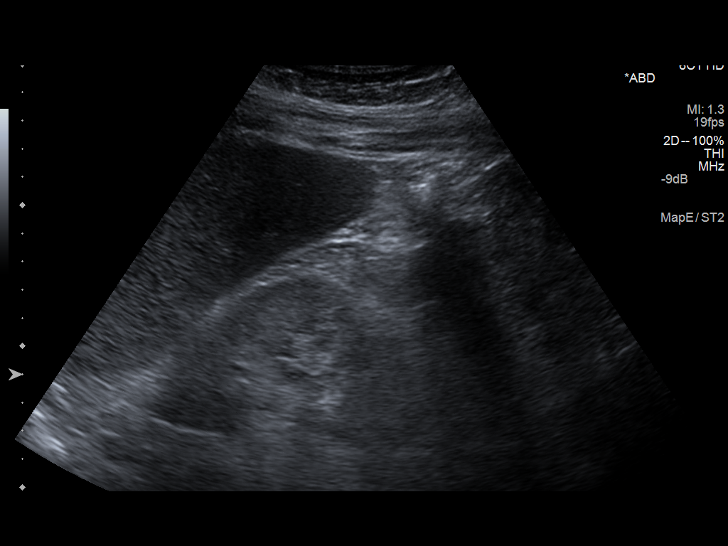
[im 85/85]
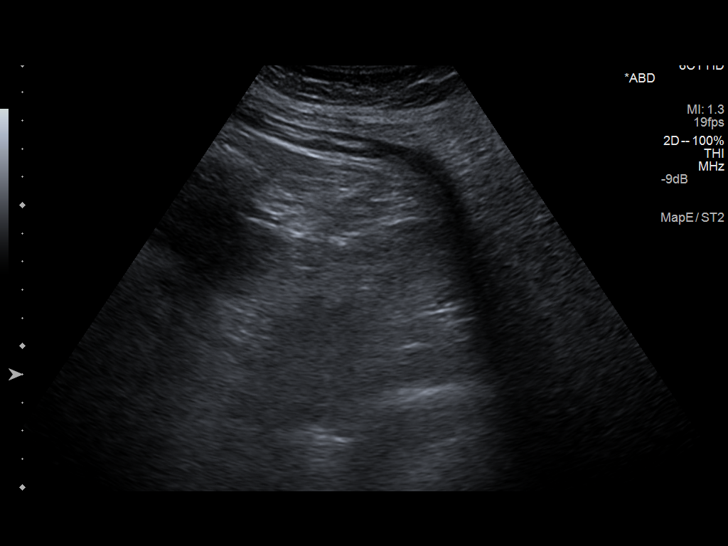

[14 of 25 positions shown; findings below may reference images not displayed]

FINDINGS: Gallbladder:  Small stones layering in the dependent portion of the
gallbladder.  Mild gallbladder wall thickening and 5.7 mm.  Unable
to assess for Murphy's sign due to patient on morphine.  Stable
appearance since previous study.

Common bile duct:  Not dilated.  Diameter measures 2.4 mm.

Liver:  Diffuse increased parenchymal echotexture suggesting fatty
infiltration.  No focal lesions identified.

IVC:  Appears normal.

Pancreas:  Pancreas is not visualized due to overlying bowel gas.

Spleen:  Spleen length measures 13 cm.  Volume is calculated at 278
ml.  Splenic enlargement.

Right Kidney:  Right kidney measures 12 cm length.  No
hydronephrosis.

Left Kidney:  Left kidney measures 10 cm length.  No
hydronephrosis.

Abdominal aorta:  Visualized portions are nonaneurysmal.
IMPRESSION: Cholelithiasis with mild gallbladder wall thickening.  Findings are
stable since previous study.  Mild diffuse fatty infiltration in
the liver.  Splenic enlargement.

## 2014-01-13 IMAGING — CR DG CHEST 1V PORT
1 series · 2 of 2 positions shown · non-contrast
Comparison: 01/24/2013

CLINICAL DATA: Hypertension.

PORTABLE CHEST - 1 VIEW

[Series 1: AP · U · 2 of 2 slices shown]
[im 1/2]
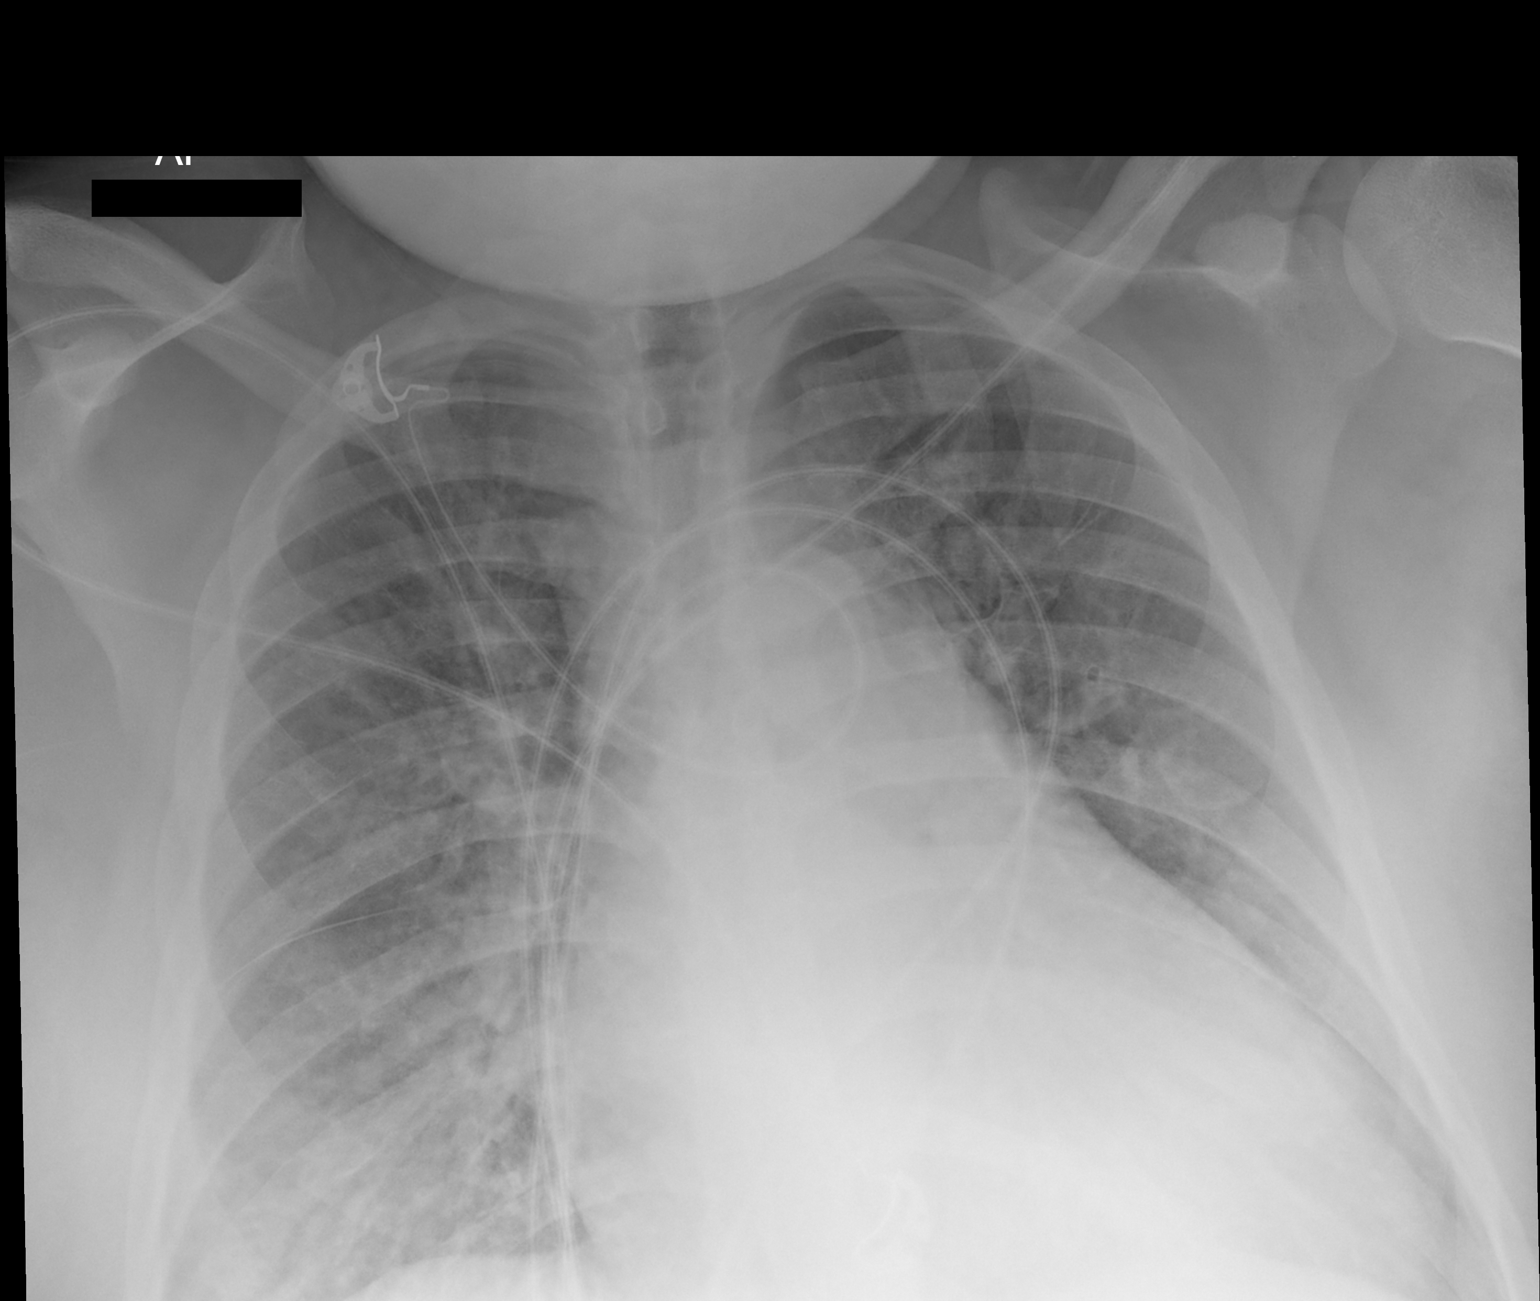
[im 2/2]
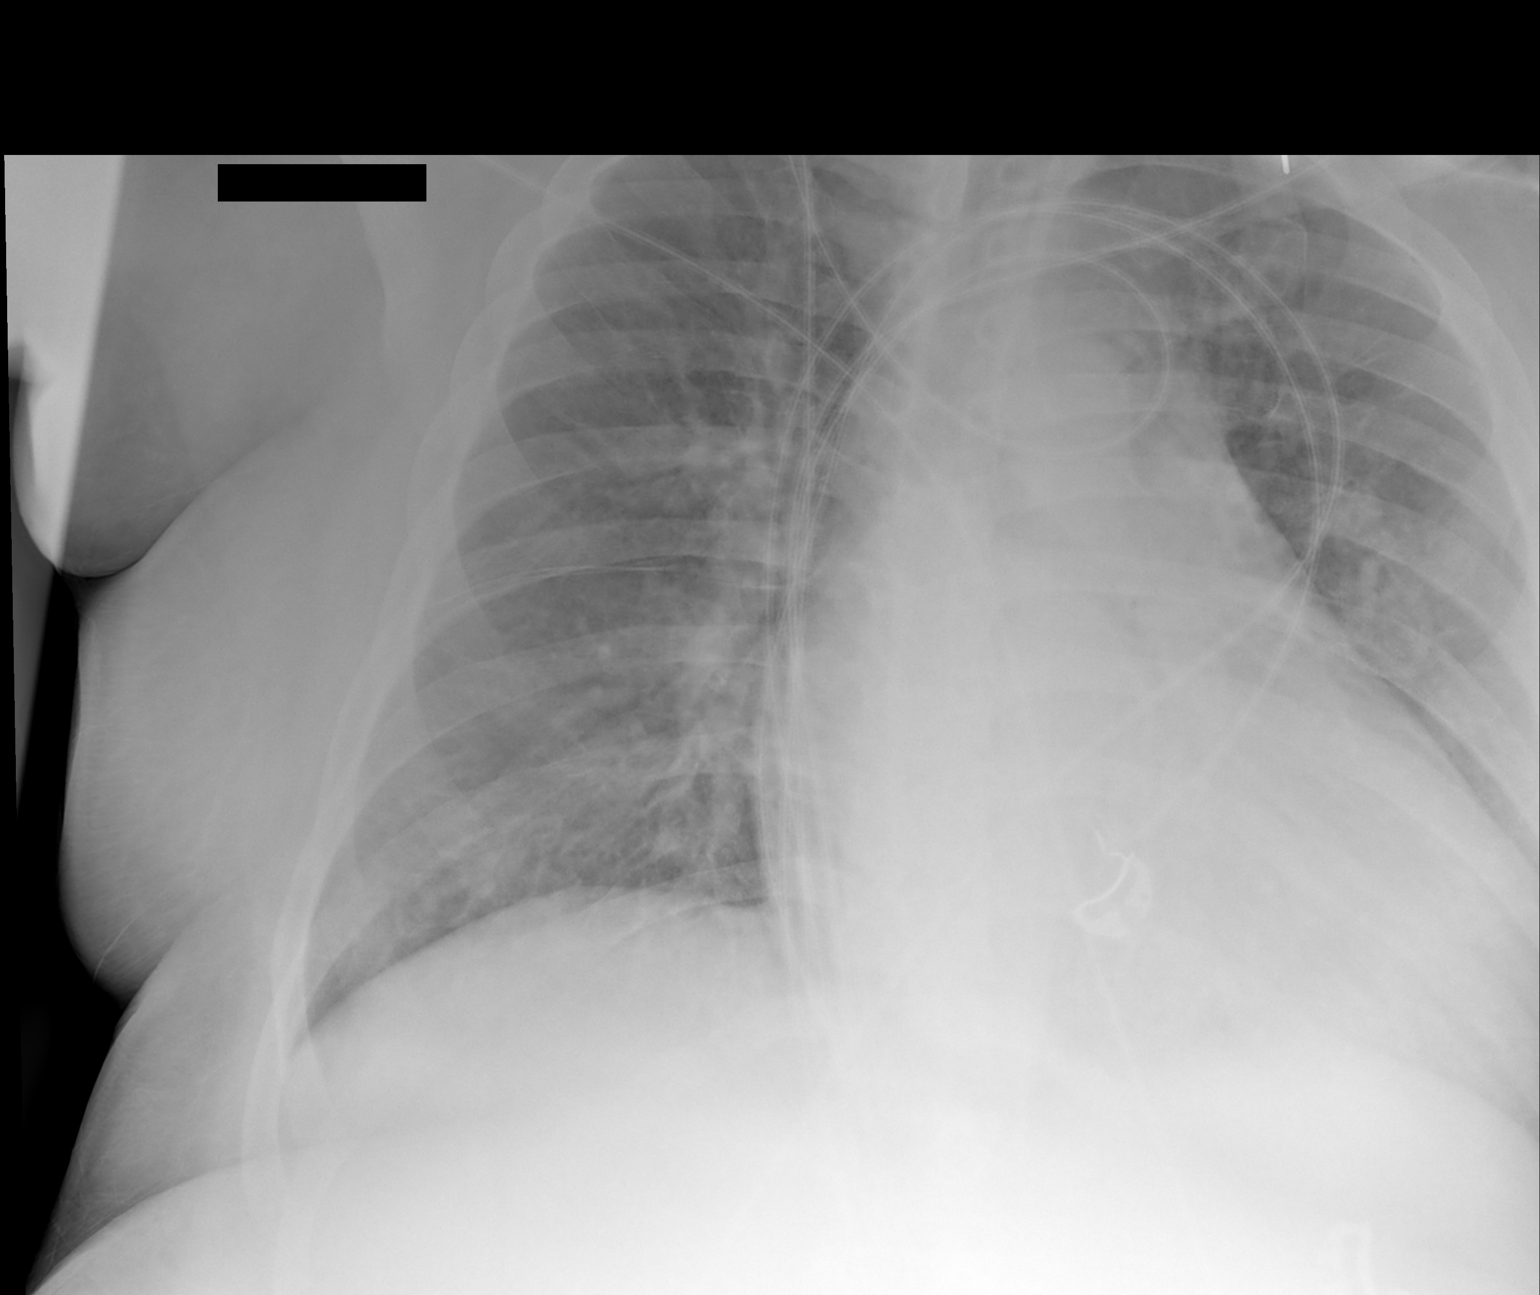

[2 of 2 positions shown; findings below may reference images not displayed]

FINDINGS: Cardiac enlargement.  Increasing pulmonary vascular
congestion since previous study.  No edema or focal consolidation
is appreciated.  No blunting of costophrenic angles.  No
pneumothorax.
IMPRESSION: Stable cardiac enlargement with interval development of mild
pulmonary vascular congestion.

## 2014-01-19 DIAGNOSIS — N186 End stage renal disease: Secondary | ICD-10-CM | POA: Diagnosis not present

## 2014-01-23 DIAGNOSIS — N186 End stage renal disease: Secondary | ICD-10-CM | POA: Diagnosis not present

## 2014-01-24 DIAGNOSIS — E1129 Type 2 diabetes mellitus with other diabetic kidney complication: Secondary | ICD-10-CM | POA: Diagnosis not present

## 2014-01-24 DIAGNOSIS — D509 Iron deficiency anemia, unspecified: Secondary | ICD-10-CM | POA: Diagnosis not present

## 2014-01-24 DIAGNOSIS — N2581 Secondary hyperparathyroidism of renal origin: Secondary | ICD-10-CM | POA: Diagnosis not present

## 2014-01-24 DIAGNOSIS — N186 End stage renal disease: Secondary | ICD-10-CM | POA: Diagnosis not present

## 2014-01-24 DIAGNOSIS — K7689 Other specified diseases of liver: Secondary | ICD-10-CM | POA: Diagnosis not present

## 2014-02-02 DIAGNOSIS — N186 End stage renal disease: Secondary | ICD-10-CM | POA: Diagnosis not present

## 2014-02-22 DIAGNOSIS — N186 End stage renal disease: Secondary | ICD-10-CM | POA: Diagnosis not present

## 2014-02-23 DIAGNOSIS — K760 Fatty (change of) liver, not elsewhere classified: Secondary | ICD-10-CM | POA: Diagnosis not present

## 2014-02-23 DIAGNOSIS — N2581 Secondary hyperparathyroidism of renal origin: Secondary | ICD-10-CM | POA: Diagnosis not present

## 2014-02-23 DIAGNOSIS — Z23 Encounter for immunization: Secondary | ICD-10-CM | POA: Diagnosis not present

## 2014-02-23 DIAGNOSIS — E1129 Type 2 diabetes mellitus with other diabetic kidney complication: Secondary | ICD-10-CM | POA: Diagnosis not present

## 2014-02-23 DIAGNOSIS — N186 End stage renal disease: Secondary | ICD-10-CM | POA: Diagnosis not present

## 2014-02-23 DIAGNOSIS — D631 Anemia in chronic kidney disease: Secondary | ICD-10-CM | POA: Diagnosis not present

## 2014-02-23 DIAGNOSIS — D509 Iron deficiency anemia, unspecified: Secondary | ICD-10-CM | POA: Diagnosis not present

## 2014-03-09 DIAGNOSIS — E1129 Type 2 diabetes mellitus with other diabetic kidney complication: Secondary | ICD-10-CM | POA: Diagnosis not present

## 2014-03-09 DIAGNOSIS — N186 End stage renal disease: Secondary | ICD-10-CM | POA: Diagnosis not present

## 2014-03-25 DIAGNOSIS — Z992 Dependence on renal dialysis: Secondary | ICD-10-CM | POA: Diagnosis not present

## 2014-03-25 DIAGNOSIS — N186 End stage renal disease: Secondary | ICD-10-CM | POA: Diagnosis not present

## 2014-03-28 DIAGNOSIS — K760 Fatty (change of) liver, not elsewhere classified: Secondary | ICD-10-CM | POA: Diagnosis not present

## 2014-03-28 DIAGNOSIS — D631 Anemia in chronic kidney disease: Secondary | ICD-10-CM | POA: Diagnosis not present

## 2014-03-28 DIAGNOSIS — D509 Iron deficiency anemia, unspecified: Secondary | ICD-10-CM | POA: Diagnosis not present

## 2014-03-28 DIAGNOSIS — N2581 Secondary hyperparathyroidism of renal origin: Secondary | ICD-10-CM | POA: Diagnosis not present

## 2014-03-28 DIAGNOSIS — N186 End stage renal disease: Secondary | ICD-10-CM | POA: Diagnosis not present

## 2014-03-28 DIAGNOSIS — E1129 Type 2 diabetes mellitus with other diabetic kidney complication: Secondary | ICD-10-CM | POA: Diagnosis not present

## 2014-04-06 DIAGNOSIS — N186 End stage renal disease: Secondary | ICD-10-CM | POA: Diagnosis not present

## 2014-04-14 DIAGNOSIS — Z7682 Awaiting organ transplant status: Secondary | ICD-10-CM | POA: Diagnosis not present

## 2014-04-14 DIAGNOSIS — Z1159 Encounter for screening for other viral diseases: Secondary | ICD-10-CM | POA: Diagnosis not present

## 2014-04-14 DIAGNOSIS — Z992 Dependence on renal dialysis: Secondary | ICD-10-CM | POA: Diagnosis not present

## 2014-04-14 DIAGNOSIS — I517 Cardiomegaly: Secondary | ICD-10-CM | POA: Diagnosis not present

## 2014-04-14 DIAGNOSIS — Z01818 Encounter for other preprocedural examination: Secondary | ICD-10-CM | POA: Diagnosis not present

## 2014-04-14 DIAGNOSIS — I4891 Unspecified atrial fibrillation: Secondary | ICD-10-CM | POA: Diagnosis not present

## 2014-04-14 DIAGNOSIS — Z049 Encounter for examination and observation for unspecified reason: Secondary | ICD-10-CM | POA: Diagnosis not present

## 2014-04-14 DIAGNOSIS — E119 Type 2 diabetes mellitus without complications: Secondary | ICD-10-CM | POA: Diagnosis not present

## 2014-04-14 DIAGNOSIS — G4739 Other sleep apnea: Secondary | ICD-10-CM | POA: Diagnosis not present

## 2014-04-14 DIAGNOSIS — I120 Hypertensive chronic kidney disease with stage 5 chronic kidney disease or end stage renal disease: Secondary | ICD-10-CM | POA: Diagnosis not present

## 2014-04-14 DIAGNOSIS — N186 End stage renal disease: Secondary | ICD-10-CM | POA: Diagnosis not present

## 2014-04-20 IMAGING — US US ABDOMEN PORT
1 series · 14 of 25 positions shown · non-contrast
Comparison: 04/12/2013.

CLINICAL DATA: Diabetes. Hypertension. Renal failure. Morbid
obesity .

EXAM:
ULTRASOUND PORTABLE ABDOMEN

[Series 1: us abdomen port · 0.26mm/px · 14 of 79 slices shown]
[im 1/79]
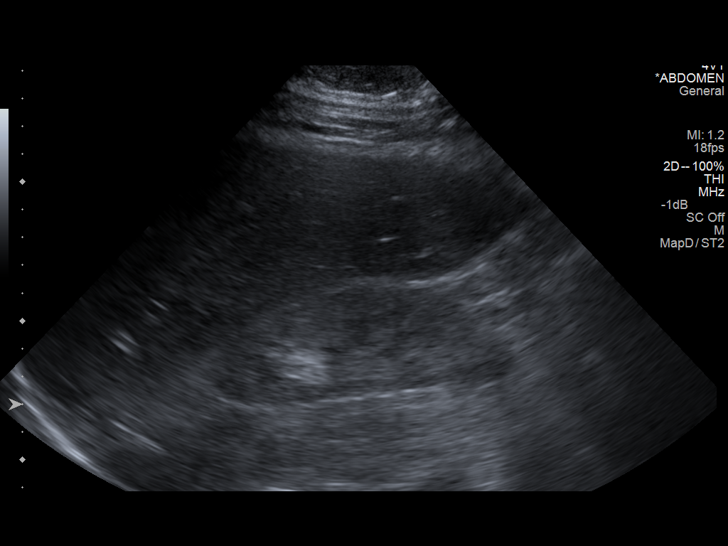
[im 7/79]
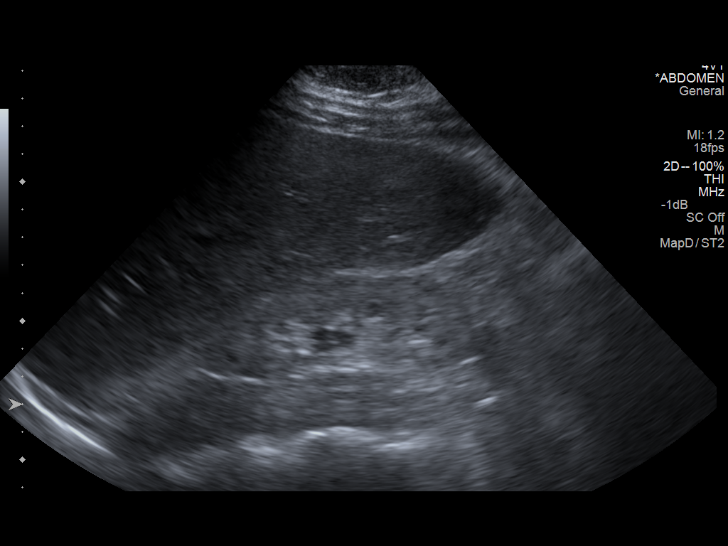
[im 14/79]
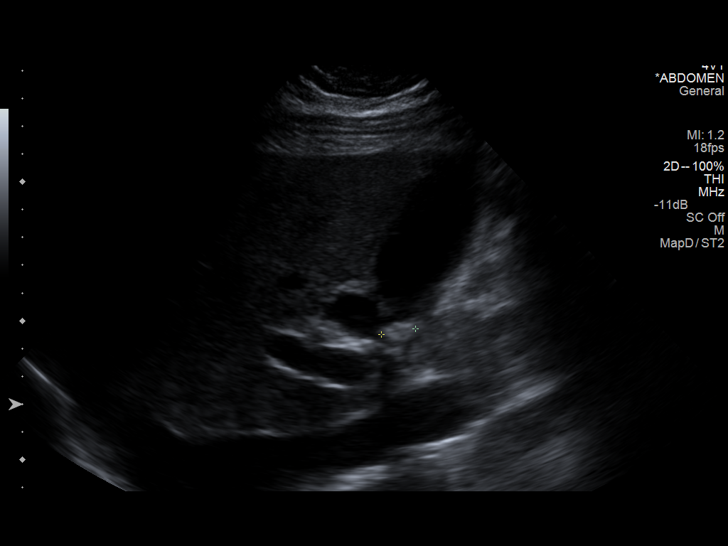
[im 20/79]
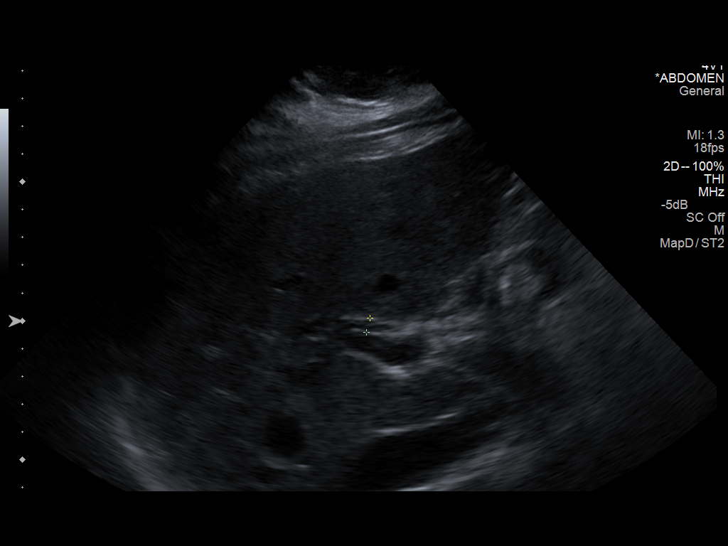
[im 27/79]
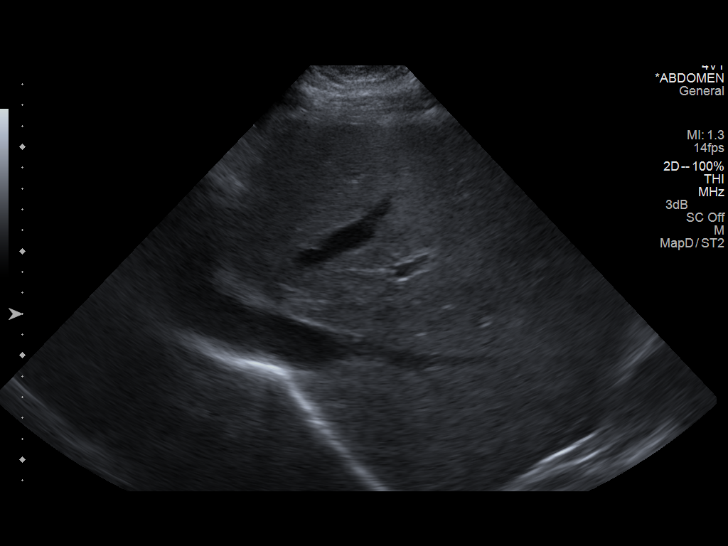
[im 30/79]
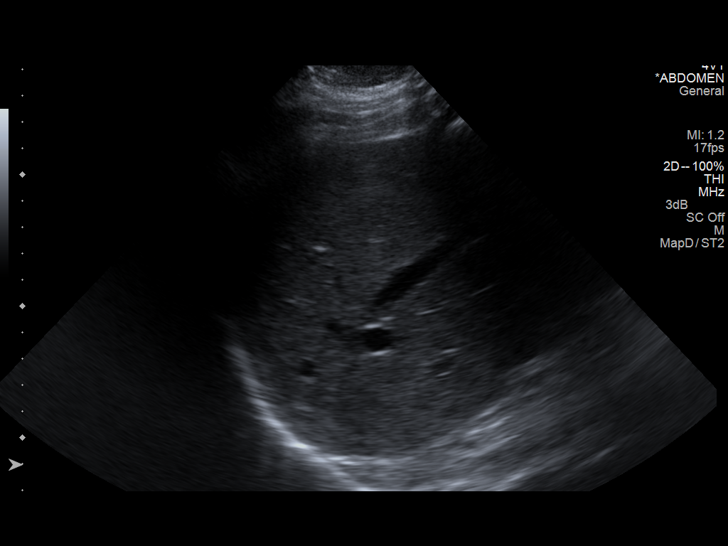
[im 36/79]
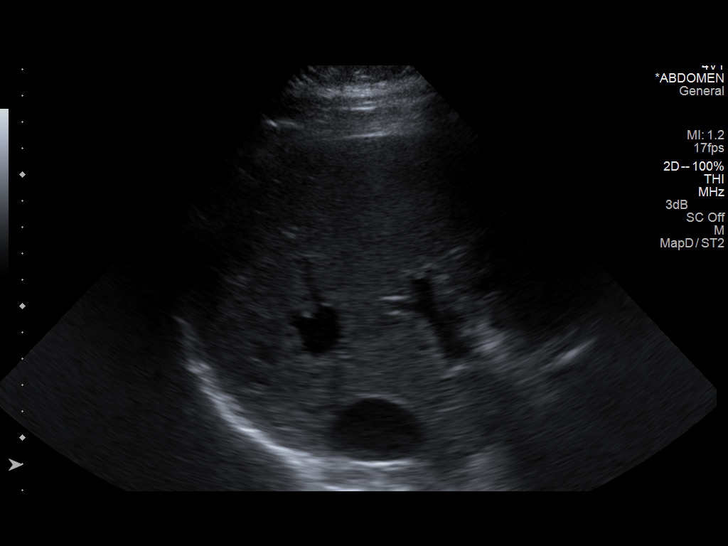
[im 43/79]
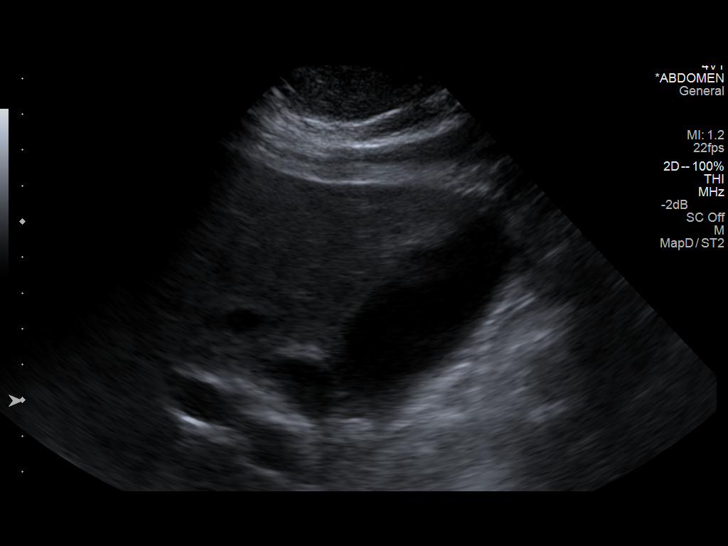
[im 49/79]
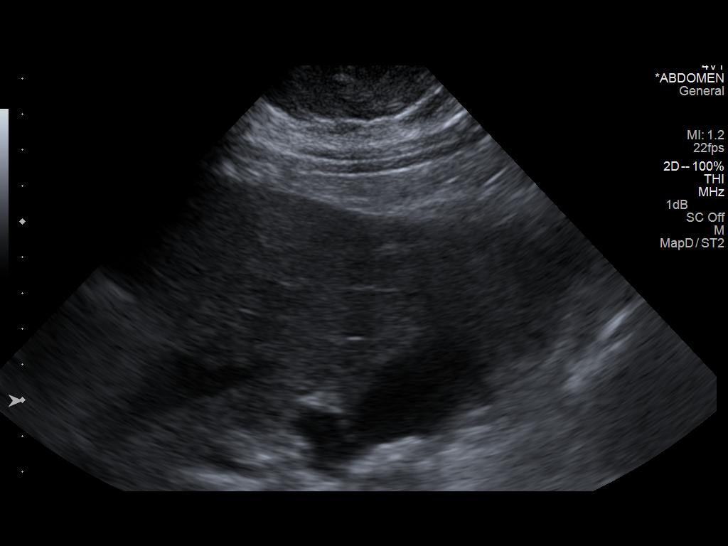
[im 53/79]
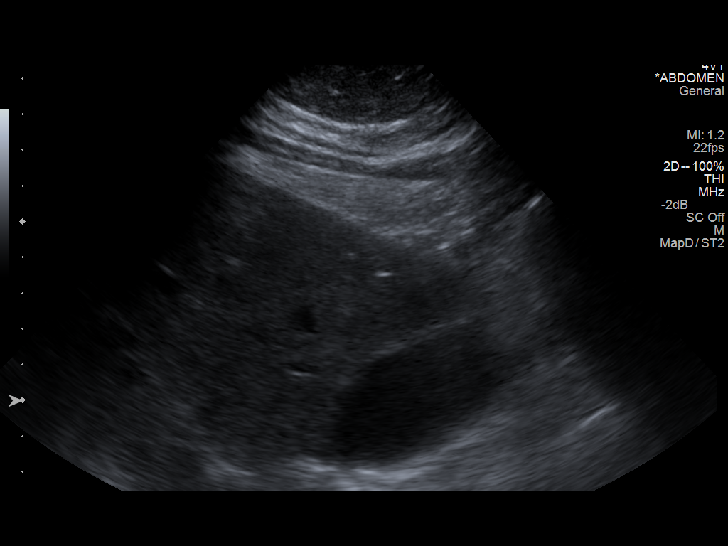
[im 59/79]
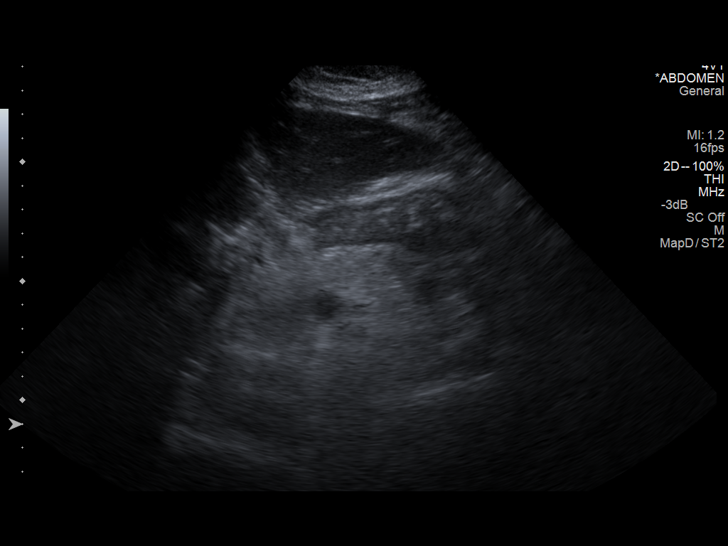
[im 66/79]
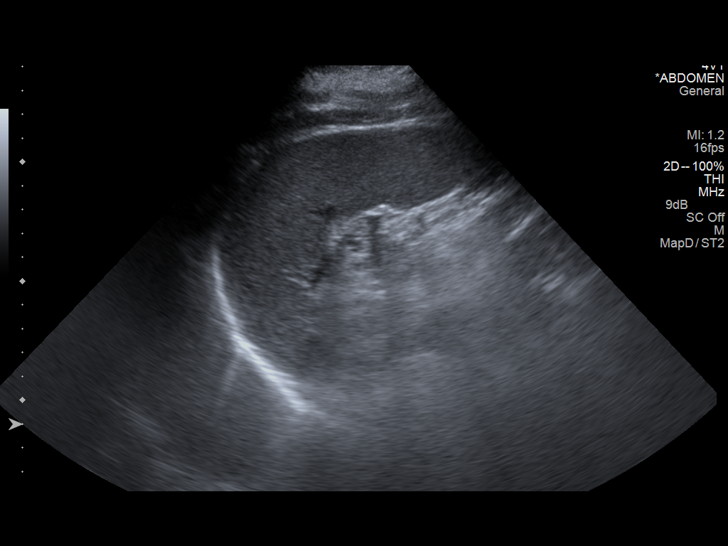
[im 72/79]
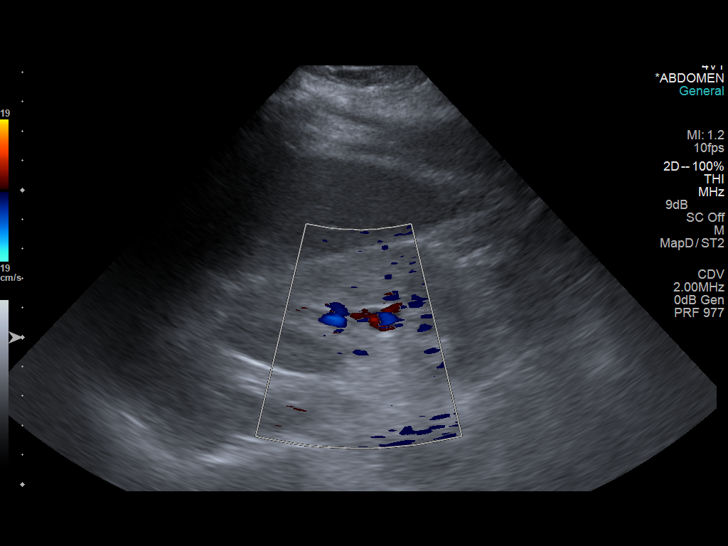
[im 79/79]
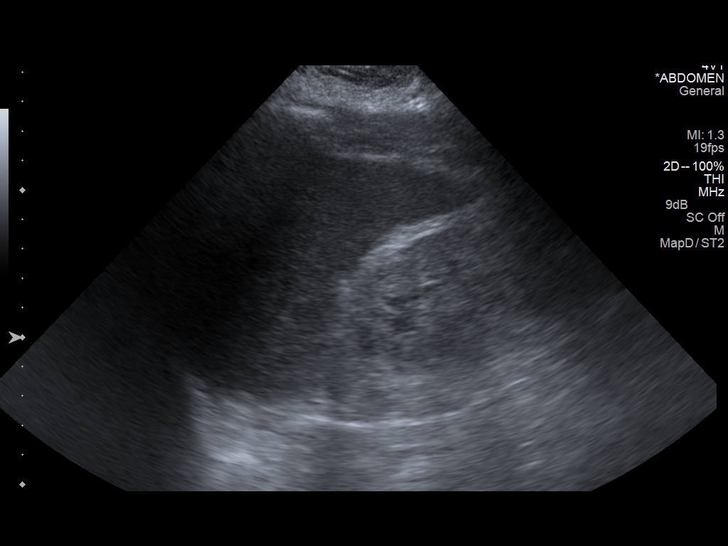

[14 of 25 positions shown; findings below may reference images not displayed]

FINDINGS: Gallbladder:

Multiple mobile gallstones again noted. Gallbladder wall thickness
normal at 1.8 mm. Negative Murphy sign. No pericholecystic fluid
collection.

Common bile duct:

Diameter: 5.3 mm.

Liver:

Increased echogenicity in the liver consistent with fatty
infiltration.

IVC:

No abnormality visualized.

Pancreas:

Visualized portion unremarkable.

Spleen:

Size and appearance within normal limits.

Right Kidney:

Length: Left 0.1 cm.. Echogenic consistent with chronic medical
renal disease.

Left Kidney:

Length: 10.3 cm.. Echogenic consistent chronic medical renal
disease.

Abdominal aorta:

No aneurysm visualized.

Other findings:

None.
IMPRESSION: 1. Gallstones.  No evidence of cholecystitis.
2. Fatty infiltration liver.
3. Increased echogenicity noted both kidneys, this suggests chronic
medical renal disease.

## 2014-04-24 DIAGNOSIS — N186 End stage renal disease: Secondary | ICD-10-CM | POA: Diagnosis not present

## 2014-04-25 DIAGNOSIS — D631 Anemia in chronic kidney disease: Secondary | ICD-10-CM | POA: Diagnosis not present

## 2014-04-25 DIAGNOSIS — N186 End stage renal disease: Secondary | ICD-10-CM | POA: Diagnosis not present

## 2014-04-25 DIAGNOSIS — N2581 Secondary hyperparathyroidism of renal origin: Secondary | ICD-10-CM | POA: Diagnosis not present

## 2014-04-25 DIAGNOSIS — D509 Iron deficiency anemia, unspecified: Secondary | ICD-10-CM | POA: Diagnosis not present

## 2014-04-25 DIAGNOSIS — E1129 Type 2 diabetes mellitus with other diabetic kidney complication: Secondary | ICD-10-CM | POA: Diagnosis not present

## 2014-04-25 DIAGNOSIS — K760 Fatty (change of) liver, not elsewhere classified: Secondary | ICD-10-CM | POA: Diagnosis not present

## 2014-05-04 ENCOUNTER — Encounter (HOSPITAL_COMMUNITY): Payer: Self-pay | Admitting: Cardiovascular Disease

## 2014-05-04 DIAGNOSIS — N186 End stage renal disease: Secondary | ICD-10-CM | POA: Diagnosis not present

## 2014-05-25 DIAGNOSIS — N186 End stage renal disease: Secondary | ICD-10-CM | POA: Diagnosis not present

## 2014-05-25 DIAGNOSIS — Z992 Dependence on renal dialysis: Secondary | ICD-10-CM | POA: Diagnosis not present

## 2014-05-28 DIAGNOSIS — K760 Fatty (change of) liver, not elsewhere classified: Secondary | ICD-10-CM | POA: Diagnosis not present

## 2014-05-28 DIAGNOSIS — N2581 Secondary hyperparathyroidism of renal origin: Secondary | ICD-10-CM | POA: Diagnosis not present

## 2014-05-28 DIAGNOSIS — N186 End stage renal disease: Secondary | ICD-10-CM | POA: Diagnosis not present

## 2014-05-28 DIAGNOSIS — D509 Iron deficiency anemia, unspecified: Secondary | ICD-10-CM | POA: Diagnosis not present

## 2014-05-28 DIAGNOSIS — E1129 Type 2 diabetes mellitus with other diabetic kidney complication: Secondary | ICD-10-CM | POA: Diagnosis not present

## 2014-06-08 DIAGNOSIS — E1129 Type 2 diabetes mellitus with other diabetic kidney complication: Secondary | ICD-10-CM | POA: Diagnosis not present

## 2014-06-08 DIAGNOSIS — Z7682 Awaiting organ transplant status: Secondary | ICD-10-CM | POA: Diagnosis not present

## 2014-06-25 DIAGNOSIS — Z992 Dependence on renal dialysis: Secondary | ICD-10-CM | POA: Diagnosis not present

## 2014-06-25 DIAGNOSIS — N186 End stage renal disease: Secondary | ICD-10-CM | POA: Diagnosis not present

## 2014-06-27 DIAGNOSIS — K760 Fatty (change of) liver, not elsewhere classified: Secondary | ICD-10-CM | POA: Diagnosis not present

## 2014-06-27 DIAGNOSIS — Z23 Encounter for immunization: Secondary | ICD-10-CM | POA: Diagnosis not present

## 2014-06-27 DIAGNOSIS — E1129 Type 2 diabetes mellitus with other diabetic kidney complication: Secondary | ICD-10-CM | POA: Diagnosis not present

## 2014-06-27 DIAGNOSIS — D509 Iron deficiency anemia, unspecified: Secondary | ICD-10-CM | POA: Diagnosis not present

## 2014-06-27 DIAGNOSIS — N2581 Secondary hyperparathyroidism of renal origin: Secondary | ICD-10-CM | POA: Diagnosis not present

## 2014-06-27 DIAGNOSIS — N186 End stage renal disease: Secondary | ICD-10-CM | POA: Diagnosis not present

## 2014-07-06 ENCOUNTER — Ambulatory Visit: Payer: Medicare Other | Admitting: Vascular Surgery

## 2014-07-06 ENCOUNTER — Other Ambulatory Visit (HOSPITAL_COMMUNITY): Payer: Medicare Other

## 2014-07-06 DIAGNOSIS — Z7682 Awaiting organ transplant status: Secondary | ICD-10-CM | POA: Diagnosis not present

## 2014-07-11 ENCOUNTER — Encounter: Payer: Self-pay | Admitting: Vascular Surgery

## 2014-07-12 ENCOUNTER — Ambulatory Visit: Payer: Medicare Other | Admitting: Vascular Surgery

## 2014-07-12 ENCOUNTER — Other Ambulatory Visit (HOSPITAL_COMMUNITY): Payer: Medicare Other

## 2014-07-24 DIAGNOSIS — Z992 Dependence on renal dialysis: Secondary | ICD-10-CM | POA: Diagnosis not present

## 2014-07-24 DIAGNOSIS — N186 End stage renal disease: Secondary | ICD-10-CM | POA: Diagnosis not present

## 2014-07-25 DIAGNOSIS — D509 Iron deficiency anemia, unspecified: Secondary | ICD-10-CM | POA: Diagnosis not present

## 2014-07-25 DIAGNOSIS — N186 End stage renal disease: Secondary | ICD-10-CM | POA: Diagnosis not present

## 2014-07-25 DIAGNOSIS — N2581 Secondary hyperparathyroidism of renal origin: Secondary | ICD-10-CM | POA: Diagnosis not present

## 2014-07-25 DIAGNOSIS — E1129 Type 2 diabetes mellitus with other diabetic kidney complication: Secondary | ICD-10-CM | POA: Diagnosis not present

## 2014-07-25 DIAGNOSIS — K760 Fatty (change of) liver, not elsewhere classified: Secondary | ICD-10-CM | POA: Diagnosis not present

## 2014-07-25 DIAGNOSIS — D631 Anemia in chronic kidney disease: Secondary | ICD-10-CM | POA: Diagnosis not present

## 2014-08-03 DIAGNOSIS — Z7682 Awaiting organ transplant status: Secondary | ICD-10-CM | POA: Diagnosis not present

## 2014-08-24 DIAGNOSIS — Z992 Dependence on renal dialysis: Secondary | ICD-10-CM | POA: Diagnosis not present

## 2014-08-24 DIAGNOSIS — N186 End stage renal disease: Secondary | ICD-10-CM | POA: Diagnosis not present

## 2014-08-24 DIAGNOSIS — I120 Hypertensive chronic kidney disease with stage 5 chronic kidney disease or end stage renal disease: Secondary | ICD-10-CM | POA: Diagnosis not present

## 2014-08-26 DIAGNOSIS — D631 Anemia in chronic kidney disease: Secondary | ICD-10-CM | POA: Diagnosis not present

## 2014-08-26 DIAGNOSIS — R509 Fever, unspecified: Secondary | ICD-10-CM | POA: Diagnosis not present

## 2014-08-26 DIAGNOSIS — N186 End stage renal disease: Secondary | ICD-10-CM | POA: Diagnosis not present

## 2014-08-26 DIAGNOSIS — D509 Iron deficiency anemia, unspecified: Secondary | ICD-10-CM | POA: Diagnosis not present

## 2014-08-26 DIAGNOSIS — E1129 Type 2 diabetes mellitus with other diabetic kidney complication: Secondary | ICD-10-CM | POA: Diagnosis not present

## 2014-08-26 DIAGNOSIS — N2581 Secondary hyperparathyroidism of renal origin: Secondary | ICD-10-CM | POA: Diagnosis not present

## 2014-08-26 DIAGNOSIS — K760 Fatty (change of) liver, not elsewhere classified: Secondary | ICD-10-CM | POA: Diagnosis not present

## 2014-09-07 DIAGNOSIS — E1129 Type 2 diabetes mellitus with other diabetic kidney complication: Secondary | ICD-10-CM | POA: Diagnosis not present

## 2014-09-23 DIAGNOSIS — Z992 Dependence on renal dialysis: Secondary | ICD-10-CM | POA: Diagnosis not present

## 2014-09-23 DIAGNOSIS — I120 Hypertensive chronic kidney disease with stage 5 chronic kidney disease or end stage renal disease: Secondary | ICD-10-CM | POA: Diagnosis not present

## 2014-09-23 DIAGNOSIS — N186 End stage renal disease: Secondary | ICD-10-CM | POA: Diagnosis not present

## 2014-09-26 DIAGNOSIS — N2581 Secondary hyperparathyroidism of renal origin: Secondary | ICD-10-CM | POA: Diagnosis not present

## 2014-09-26 DIAGNOSIS — D509 Iron deficiency anemia, unspecified: Secondary | ICD-10-CM | POA: Diagnosis not present

## 2014-09-26 DIAGNOSIS — R509 Fever, unspecified: Secondary | ICD-10-CM | POA: Diagnosis not present

## 2014-09-26 DIAGNOSIS — K760 Fatty (change of) liver, not elsewhere classified: Secondary | ICD-10-CM | POA: Diagnosis not present

## 2014-09-26 DIAGNOSIS — N186 End stage renal disease: Secondary | ICD-10-CM | POA: Diagnosis not present

## 2014-09-26 DIAGNOSIS — E1129 Type 2 diabetes mellitus with other diabetic kidney complication: Secondary | ICD-10-CM | POA: Diagnosis not present

## 2014-09-28 DIAGNOSIS — D509 Iron deficiency anemia, unspecified: Secondary | ICD-10-CM | POA: Diagnosis not present

## 2014-09-28 DIAGNOSIS — E1129 Type 2 diabetes mellitus with other diabetic kidney complication: Secondary | ICD-10-CM | POA: Diagnosis not present

## 2014-09-28 DIAGNOSIS — K760 Fatty (change of) liver, not elsewhere classified: Secondary | ICD-10-CM | POA: Diagnosis not present

## 2014-09-28 DIAGNOSIS — N186 End stage renal disease: Secondary | ICD-10-CM | POA: Diagnosis not present

## 2014-09-28 DIAGNOSIS — R509 Fever, unspecified: Secondary | ICD-10-CM | POA: Diagnosis not present

## 2014-09-28 DIAGNOSIS — N2581 Secondary hyperparathyroidism of renal origin: Secondary | ICD-10-CM | POA: Diagnosis not present

## 2014-09-30 DIAGNOSIS — N186 End stage renal disease: Secondary | ICD-10-CM | POA: Diagnosis not present

## 2014-09-30 DIAGNOSIS — D509 Iron deficiency anemia, unspecified: Secondary | ICD-10-CM | POA: Diagnosis not present

## 2014-09-30 DIAGNOSIS — E1129 Type 2 diabetes mellitus with other diabetic kidney complication: Secondary | ICD-10-CM | POA: Diagnosis not present

## 2014-09-30 DIAGNOSIS — R509 Fever, unspecified: Secondary | ICD-10-CM | POA: Diagnosis not present

## 2014-09-30 DIAGNOSIS — K760 Fatty (change of) liver, not elsewhere classified: Secondary | ICD-10-CM | POA: Diagnosis not present

## 2014-09-30 DIAGNOSIS — N2581 Secondary hyperparathyroidism of renal origin: Secondary | ICD-10-CM | POA: Diagnosis not present

## 2014-10-03 DIAGNOSIS — N186 End stage renal disease: Secondary | ICD-10-CM | POA: Diagnosis not present

## 2014-10-03 DIAGNOSIS — N2581 Secondary hyperparathyroidism of renal origin: Secondary | ICD-10-CM | POA: Diagnosis not present

## 2014-10-03 DIAGNOSIS — K760 Fatty (change of) liver, not elsewhere classified: Secondary | ICD-10-CM | POA: Diagnosis not present

## 2014-10-03 DIAGNOSIS — E1129 Type 2 diabetes mellitus with other diabetic kidney complication: Secondary | ICD-10-CM | POA: Diagnosis not present

## 2014-10-03 DIAGNOSIS — D509 Iron deficiency anemia, unspecified: Secondary | ICD-10-CM | POA: Diagnosis not present

## 2014-10-03 DIAGNOSIS — R509 Fever, unspecified: Secondary | ICD-10-CM | POA: Diagnosis not present

## 2014-10-05 DIAGNOSIS — E1129 Type 2 diabetes mellitus with other diabetic kidney complication: Secondary | ICD-10-CM | POA: Diagnosis not present

## 2014-10-05 DIAGNOSIS — R509 Fever, unspecified: Secondary | ICD-10-CM | POA: Diagnosis not present

## 2014-10-05 DIAGNOSIS — N2581 Secondary hyperparathyroidism of renal origin: Secondary | ICD-10-CM | POA: Diagnosis not present

## 2014-10-05 DIAGNOSIS — D509 Iron deficiency anemia, unspecified: Secondary | ICD-10-CM | POA: Diagnosis not present

## 2014-10-05 DIAGNOSIS — K760 Fatty (change of) liver, not elsewhere classified: Secondary | ICD-10-CM | POA: Diagnosis not present

## 2014-10-05 DIAGNOSIS — N186 End stage renal disease: Secondary | ICD-10-CM | POA: Diagnosis not present

## 2014-10-07 DIAGNOSIS — R509 Fever, unspecified: Secondary | ICD-10-CM | POA: Diagnosis not present

## 2014-10-07 DIAGNOSIS — E1129 Type 2 diabetes mellitus with other diabetic kidney complication: Secondary | ICD-10-CM | POA: Diagnosis not present

## 2014-10-07 DIAGNOSIS — K760 Fatty (change of) liver, not elsewhere classified: Secondary | ICD-10-CM | POA: Diagnosis not present

## 2014-10-07 DIAGNOSIS — N2581 Secondary hyperparathyroidism of renal origin: Secondary | ICD-10-CM | POA: Diagnosis not present

## 2014-10-07 DIAGNOSIS — D509 Iron deficiency anemia, unspecified: Secondary | ICD-10-CM | POA: Diagnosis not present

## 2014-10-07 DIAGNOSIS — N186 End stage renal disease: Secondary | ICD-10-CM | POA: Diagnosis not present

## 2014-10-10 DIAGNOSIS — D509 Iron deficiency anemia, unspecified: Secondary | ICD-10-CM | POA: Diagnosis not present

## 2014-10-10 DIAGNOSIS — K760 Fatty (change of) liver, not elsewhere classified: Secondary | ICD-10-CM | POA: Diagnosis not present

## 2014-10-10 DIAGNOSIS — N186 End stage renal disease: Secondary | ICD-10-CM | POA: Diagnosis not present

## 2014-10-10 DIAGNOSIS — E1129 Type 2 diabetes mellitus with other diabetic kidney complication: Secondary | ICD-10-CM | POA: Diagnosis not present

## 2014-10-10 DIAGNOSIS — R509 Fever, unspecified: Secondary | ICD-10-CM | POA: Diagnosis not present

## 2014-10-10 DIAGNOSIS — N2581 Secondary hyperparathyroidism of renal origin: Secondary | ICD-10-CM | POA: Diagnosis not present

## 2014-10-12 DIAGNOSIS — K760 Fatty (change of) liver, not elsewhere classified: Secondary | ICD-10-CM | POA: Diagnosis not present

## 2014-10-12 DIAGNOSIS — N2581 Secondary hyperparathyroidism of renal origin: Secondary | ICD-10-CM | POA: Diagnosis not present

## 2014-10-12 DIAGNOSIS — N186 End stage renal disease: Secondary | ICD-10-CM | POA: Diagnosis not present

## 2014-10-12 DIAGNOSIS — R509 Fever, unspecified: Secondary | ICD-10-CM | POA: Diagnosis not present

## 2014-10-12 DIAGNOSIS — E1129 Type 2 diabetes mellitus with other diabetic kidney complication: Secondary | ICD-10-CM | POA: Diagnosis not present

## 2014-10-12 DIAGNOSIS — D509 Iron deficiency anemia, unspecified: Secondary | ICD-10-CM | POA: Diagnosis not present

## 2014-10-14 DIAGNOSIS — K760 Fatty (change of) liver, not elsewhere classified: Secondary | ICD-10-CM | POA: Diagnosis not present

## 2014-10-14 DIAGNOSIS — E1129 Type 2 diabetes mellitus with other diabetic kidney complication: Secondary | ICD-10-CM | POA: Diagnosis not present

## 2014-10-14 DIAGNOSIS — N2581 Secondary hyperparathyroidism of renal origin: Secondary | ICD-10-CM | POA: Diagnosis not present

## 2014-10-14 DIAGNOSIS — R509 Fever, unspecified: Secondary | ICD-10-CM | POA: Diagnosis not present

## 2014-10-14 DIAGNOSIS — D509 Iron deficiency anemia, unspecified: Secondary | ICD-10-CM | POA: Diagnosis not present

## 2014-10-14 DIAGNOSIS — N186 End stage renal disease: Secondary | ICD-10-CM | POA: Diagnosis not present

## 2014-10-17 DIAGNOSIS — E1129 Type 2 diabetes mellitus with other diabetic kidney complication: Secondary | ICD-10-CM | POA: Diagnosis not present

## 2014-10-17 DIAGNOSIS — N2581 Secondary hyperparathyroidism of renal origin: Secondary | ICD-10-CM | POA: Diagnosis not present

## 2014-10-17 DIAGNOSIS — N186 End stage renal disease: Secondary | ICD-10-CM | POA: Diagnosis not present

## 2014-10-17 DIAGNOSIS — D509 Iron deficiency anemia, unspecified: Secondary | ICD-10-CM | POA: Diagnosis not present

## 2014-10-17 DIAGNOSIS — R509 Fever, unspecified: Secondary | ICD-10-CM | POA: Diagnosis not present

## 2014-10-17 DIAGNOSIS — K760 Fatty (change of) liver, not elsewhere classified: Secondary | ICD-10-CM | POA: Diagnosis not present

## 2014-10-19 DIAGNOSIS — N2581 Secondary hyperparathyroidism of renal origin: Secondary | ICD-10-CM | POA: Diagnosis not present

## 2014-10-19 DIAGNOSIS — K760 Fatty (change of) liver, not elsewhere classified: Secondary | ICD-10-CM | POA: Diagnosis not present

## 2014-10-19 DIAGNOSIS — N186 End stage renal disease: Secondary | ICD-10-CM | POA: Diagnosis not present

## 2014-10-19 DIAGNOSIS — R509 Fever, unspecified: Secondary | ICD-10-CM | POA: Diagnosis not present

## 2014-10-19 DIAGNOSIS — D509 Iron deficiency anemia, unspecified: Secondary | ICD-10-CM | POA: Diagnosis not present

## 2014-10-19 DIAGNOSIS — E1129 Type 2 diabetes mellitus with other diabetic kidney complication: Secondary | ICD-10-CM | POA: Diagnosis not present

## 2014-10-21 DIAGNOSIS — R509 Fever, unspecified: Secondary | ICD-10-CM | POA: Diagnosis not present

## 2014-10-21 DIAGNOSIS — N186 End stage renal disease: Secondary | ICD-10-CM | POA: Diagnosis not present

## 2014-10-21 DIAGNOSIS — K760 Fatty (change of) liver, not elsewhere classified: Secondary | ICD-10-CM | POA: Diagnosis not present

## 2014-10-21 DIAGNOSIS — E1129 Type 2 diabetes mellitus with other diabetic kidney complication: Secondary | ICD-10-CM | POA: Diagnosis not present

## 2014-10-21 DIAGNOSIS — D509 Iron deficiency anemia, unspecified: Secondary | ICD-10-CM | POA: Diagnosis not present

## 2014-10-21 DIAGNOSIS — N2581 Secondary hyperparathyroidism of renal origin: Secondary | ICD-10-CM | POA: Diagnosis not present

## 2014-10-24 DIAGNOSIS — K760 Fatty (change of) liver, not elsewhere classified: Secondary | ICD-10-CM | POA: Diagnosis not present

## 2014-10-24 DIAGNOSIS — D509 Iron deficiency anemia, unspecified: Secondary | ICD-10-CM | POA: Diagnosis not present

## 2014-10-24 DIAGNOSIS — N186 End stage renal disease: Secondary | ICD-10-CM | POA: Diagnosis not present

## 2014-10-24 DIAGNOSIS — N2581 Secondary hyperparathyroidism of renal origin: Secondary | ICD-10-CM | POA: Diagnosis not present

## 2014-10-24 DIAGNOSIS — I120 Hypertensive chronic kidney disease with stage 5 chronic kidney disease or end stage renal disease: Secondary | ICD-10-CM | POA: Diagnosis not present

## 2014-10-24 DIAGNOSIS — Z992 Dependence on renal dialysis: Secondary | ICD-10-CM | POA: Diagnosis not present

## 2014-10-24 DIAGNOSIS — E1129 Type 2 diabetes mellitus with other diabetic kidney complication: Secondary | ICD-10-CM | POA: Diagnosis not present

## 2014-10-24 DIAGNOSIS — R509 Fever, unspecified: Secondary | ICD-10-CM | POA: Diagnosis not present

## 2014-10-26 DIAGNOSIS — N2581 Secondary hyperparathyroidism of renal origin: Secondary | ICD-10-CM | POA: Diagnosis not present

## 2014-10-26 DIAGNOSIS — D509 Iron deficiency anemia, unspecified: Secondary | ICD-10-CM | POA: Diagnosis not present

## 2014-10-26 DIAGNOSIS — E1129 Type 2 diabetes mellitus with other diabetic kidney complication: Secondary | ICD-10-CM | POA: Diagnosis not present

## 2014-10-26 DIAGNOSIS — K760 Fatty (change of) liver, not elsewhere classified: Secondary | ICD-10-CM | POA: Diagnosis not present

## 2014-10-26 DIAGNOSIS — N186 End stage renal disease: Secondary | ICD-10-CM | POA: Diagnosis not present

## 2014-11-23 DIAGNOSIS — Z992 Dependence on renal dialysis: Secondary | ICD-10-CM | POA: Diagnosis not present

## 2014-11-23 DIAGNOSIS — N186 End stage renal disease: Secondary | ICD-10-CM | POA: Diagnosis not present

## 2014-11-23 DIAGNOSIS — I120 Hypertensive chronic kidney disease with stage 5 chronic kidney disease or end stage renal disease: Secondary | ICD-10-CM | POA: Diagnosis not present

## 2014-11-25 DIAGNOSIS — D509 Iron deficiency anemia, unspecified: Secondary | ICD-10-CM | POA: Diagnosis not present

## 2014-11-25 DIAGNOSIS — E1129 Type 2 diabetes mellitus with other diabetic kidney complication: Secondary | ICD-10-CM | POA: Diagnosis not present

## 2014-11-25 DIAGNOSIS — N186 End stage renal disease: Secondary | ICD-10-CM | POA: Diagnosis not present

## 2014-11-25 DIAGNOSIS — N2581 Secondary hyperparathyroidism of renal origin: Secondary | ICD-10-CM | POA: Diagnosis not present

## 2014-11-25 DIAGNOSIS — K760 Fatty (change of) liver, not elsewhere classified: Secondary | ICD-10-CM | POA: Diagnosis not present

## 2014-11-28 DIAGNOSIS — N2581 Secondary hyperparathyroidism of renal origin: Secondary | ICD-10-CM | POA: Diagnosis not present

## 2014-11-28 DIAGNOSIS — E1129 Type 2 diabetes mellitus with other diabetic kidney complication: Secondary | ICD-10-CM | POA: Diagnosis not present

## 2014-11-28 DIAGNOSIS — N186 End stage renal disease: Secondary | ICD-10-CM | POA: Diagnosis not present

## 2014-11-28 DIAGNOSIS — K760 Fatty (change of) liver, not elsewhere classified: Secondary | ICD-10-CM | POA: Diagnosis not present

## 2014-11-30 DIAGNOSIS — K760 Fatty (change of) liver, not elsewhere classified: Secondary | ICD-10-CM | POA: Diagnosis not present

## 2014-11-30 DIAGNOSIS — N186 End stage renal disease: Secondary | ICD-10-CM | POA: Diagnosis not present

## 2014-11-30 DIAGNOSIS — N2581 Secondary hyperparathyroidism of renal origin: Secondary | ICD-10-CM | POA: Diagnosis not present

## 2014-11-30 DIAGNOSIS — E1129 Type 2 diabetes mellitus with other diabetic kidney complication: Secondary | ICD-10-CM | POA: Diagnosis not present

## 2014-12-02 DIAGNOSIS — N2581 Secondary hyperparathyroidism of renal origin: Secondary | ICD-10-CM | POA: Diagnosis not present

## 2014-12-02 DIAGNOSIS — E1129 Type 2 diabetes mellitus with other diabetic kidney complication: Secondary | ICD-10-CM | POA: Diagnosis not present

## 2014-12-02 DIAGNOSIS — K760 Fatty (change of) liver, not elsewhere classified: Secondary | ICD-10-CM | POA: Diagnosis not present

## 2014-12-02 DIAGNOSIS — N186 End stage renal disease: Secondary | ICD-10-CM | POA: Diagnosis not present

## 2014-12-05 DIAGNOSIS — N2581 Secondary hyperparathyroidism of renal origin: Secondary | ICD-10-CM | POA: Diagnosis not present

## 2014-12-05 DIAGNOSIS — K760 Fatty (change of) liver, not elsewhere classified: Secondary | ICD-10-CM | POA: Diagnosis not present

## 2014-12-05 DIAGNOSIS — N186 End stage renal disease: Secondary | ICD-10-CM | POA: Diagnosis not present

## 2014-12-05 DIAGNOSIS — E1129 Type 2 diabetes mellitus with other diabetic kidney complication: Secondary | ICD-10-CM | POA: Diagnosis not present

## 2014-12-07 DIAGNOSIS — N2581 Secondary hyperparathyroidism of renal origin: Secondary | ICD-10-CM | POA: Diagnosis not present

## 2014-12-07 DIAGNOSIS — N186 End stage renal disease: Secondary | ICD-10-CM | POA: Diagnosis not present

## 2014-12-07 DIAGNOSIS — K760 Fatty (change of) liver, not elsewhere classified: Secondary | ICD-10-CM | POA: Diagnosis not present

## 2014-12-07 DIAGNOSIS — E1129 Type 2 diabetes mellitus with other diabetic kidney complication: Secondary | ICD-10-CM | POA: Diagnosis not present

## 2014-12-09 DIAGNOSIS — N186 End stage renal disease: Secondary | ICD-10-CM | POA: Diagnosis not present

## 2014-12-09 DIAGNOSIS — K760 Fatty (change of) liver, not elsewhere classified: Secondary | ICD-10-CM | POA: Diagnosis not present

## 2014-12-09 DIAGNOSIS — N2581 Secondary hyperparathyroidism of renal origin: Secondary | ICD-10-CM | POA: Diagnosis not present

## 2014-12-09 DIAGNOSIS — E1129 Type 2 diabetes mellitus with other diabetic kidney complication: Secondary | ICD-10-CM | POA: Diagnosis not present

## 2014-12-12 DIAGNOSIS — K760 Fatty (change of) liver, not elsewhere classified: Secondary | ICD-10-CM | POA: Diagnosis not present

## 2014-12-12 DIAGNOSIS — E1129 Type 2 diabetes mellitus with other diabetic kidney complication: Secondary | ICD-10-CM | POA: Diagnosis not present

## 2014-12-12 DIAGNOSIS — N186 End stage renal disease: Secondary | ICD-10-CM | POA: Diagnosis not present

## 2014-12-12 DIAGNOSIS — N2581 Secondary hyperparathyroidism of renal origin: Secondary | ICD-10-CM | POA: Diagnosis not present

## 2014-12-14 DIAGNOSIS — E1129 Type 2 diabetes mellitus with other diabetic kidney complication: Secondary | ICD-10-CM | POA: Diagnosis not present

## 2014-12-14 DIAGNOSIS — N186 End stage renal disease: Secondary | ICD-10-CM | POA: Diagnosis not present

## 2014-12-14 DIAGNOSIS — K760 Fatty (change of) liver, not elsewhere classified: Secondary | ICD-10-CM | POA: Diagnosis not present

## 2014-12-14 DIAGNOSIS — N2581 Secondary hyperparathyroidism of renal origin: Secondary | ICD-10-CM | POA: Diagnosis not present

## 2014-12-16 DIAGNOSIS — N186 End stage renal disease: Secondary | ICD-10-CM | POA: Diagnosis not present

## 2014-12-16 DIAGNOSIS — N2581 Secondary hyperparathyroidism of renal origin: Secondary | ICD-10-CM | POA: Diagnosis not present

## 2014-12-16 DIAGNOSIS — E1129 Type 2 diabetes mellitus with other diabetic kidney complication: Secondary | ICD-10-CM | POA: Diagnosis not present

## 2014-12-16 DIAGNOSIS — K760 Fatty (change of) liver, not elsewhere classified: Secondary | ICD-10-CM | POA: Diagnosis not present

## 2014-12-19 DIAGNOSIS — N2581 Secondary hyperparathyroidism of renal origin: Secondary | ICD-10-CM | POA: Diagnosis not present

## 2014-12-19 DIAGNOSIS — K760 Fatty (change of) liver, not elsewhere classified: Secondary | ICD-10-CM | POA: Diagnosis not present

## 2014-12-19 DIAGNOSIS — E1129 Type 2 diabetes mellitus with other diabetic kidney complication: Secondary | ICD-10-CM | POA: Diagnosis not present

## 2014-12-19 DIAGNOSIS — N186 End stage renal disease: Secondary | ICD-10-CM | POA: Diagnosis not present

## 2014-12-21 DIAGNOSIS — E1129 Type 2 diabetes mellitus with other diabetic kidney complication: Secondary | ICD-10-CM | POA: Diagnosis not present

## 2014-12-21 DIAGNOSIS — N186 End stage renal disease: Secondary | ICD-10-CM | POA: Diagnosis not present

## 2014-12-21 DIAGNOSIS — N2581 Secondary hyperparathyroidism of renal origin: Secondary | ICD-10-CM | POA: Diagnosis not present

## 2014-12-21 DIAGNOSIS — K760 Fatty (change of) liver, not elsewhere classified: Secondary | ICD-10-CM | POA: Diagnosis not present

## 2014-12-23 DIAGNOSIS — K760 Fatty (change of) liver, not elsewhere classified: Secondary | ICD-10-CM | POA: Diagnosis not present

## 2014-12-23 DIAGNOSIS — N2581 Secondary hyperparathyroidism of renal origin: Secondary | ICD-10-CM | POA: Diagnosis not present

## 2014-12-23 DIAGNOSIS — N186 End stage renal disease: Secondary | ICD-10-CM | POA: Diagnosis not present

## 2014-12-23 DIAGNOSIS — E1129 Type 2 diabetes mellitus with other diabetic kidney complication: Secondary | ICD-10-CM | POA: Diagnosis not present

## 2014-12-24 DIAGNOSIS — N186 End stage renal disease: Secondary | ICD-10-CM | POA: Diagnosis not present

## 2014-12-24 DIAGNOSIS — I120 Hypertensive chronic kidney disease with stage 5 chronic kidney disease or end stage renal disease: Secondary | ICD-10-CM | POA: Diagnosis not present

## 2014-12-24 DIAGNOSIS — Z992 Dependence on renal dialysis: Secondary | ICD-10-CM | POA: Diagnosis not present

## 2014-12-26 DIAGNOSIS — K760 Fatty (change of) liver, not elsewhere classified: Secondary | ICD-10-CM | POA: Diagnosis not present

## 2014-12-26 DIAGNOSIS — N186 End stage renal disease: Secondary | ICD-10-CM | POA: Diagnosis not present

## 2014-12-26 DIAGNOSIS — D509 Iron deficiency anemia, unspecified: Secondary | ICD-10-CM | POA: Diagnosis not present

## 2014-12-26 DIAGNOSIS — E1129 Type 2 diabetes mellitus with other diabetic kidney complication: Secondary | ICD-10-CM | POA: Diagnosis not present

## 2014-12-26 DIAGNOSIS — D631 Anemia in chronic kidney disease: Secondary | ICD-10-CM | POA: Diagnosis not present

## 2014-12-26 DIAGNOSIS — N2581 Secondary hyperparathyroidism of renal origin: Secondary | ICD-10-CM | POA: Diagnosis not present

## 2015-01-24 DIAGNOSIS — I120 Hypertensive chronic kidney disease with stage 5 chronic kidney disease or end stage renal disease: Secondary | ICD-10-CM | POA: Diagnosis not present

## 2015-01-24 DIAGNOSIS — Z992 Dependence on renal dialysis: Secondary | ICD-10-CM | POA: Diagnosis not present

## 2015-01-24 DIAGNOSIS — N186 End stage renal disease: Secondary | ICD-10-CM | POA: Diagnosis not present

## 2015-01-25 DIAGNOSIS — E1129 Type 2 diabetes mellitus with other diabetic kidney complication: Secondary | ICD-10-CM | POA: Diagnosis not present

## 2015-01-25 DIAGNOSIS — D631 Anemia in chronic kidney disease: Secondary | ICD-10-CM | POA: Diagnosis not present

## 2015-01-25 DIAGNOSIS — N186 End stage renal disease: Secondary | ICD-10-CM | POA: Diagnosis not present

## 2015-01-25 DIAGNOSIS — N2581 Secondary hyperparathyroidism of renal origin: Secondary | ICD-10-CM | POA: Diagnosis not present

## 2015-01-25 DIAGNOSIS — D509 Iron deficiency anemia, unspecified: Secondary | ICD-10-CM | POA: Diagnosis not present

## 2015-01-25 DIAGNOSIS — K760 Fatty (change of) liver, not elsewhere classified: Secondary | ICD-10-CM | POA: Diagnosis not present

## 2015-01-27 DIAGNOSIS — N2581 Secondary hyperparathyroidism of renal origin: Secondary | ICD-10-CM | POA: Diagnosis not present

## 2015-01-27 DIAGNOSIS — K760 Fatty (change of) liver, not elsewhere classified: Secondary | ICD-10-CM | POA: Diagnosis not present

## 2015-01-27 DIAGNOSIS — D509 Iron deficiency anemia, unspecified: Secondary | ICD-10-CM | POA: Diagnosis not present

## 2015-01-27 DIAGNOSIS — D631 Anemia in chronic kidney disease: Secondary | ICD-10-CM | POA: Diagnosis not present

## 2015-01-27 DIAGNOSIS — N186 End stage renal disease: Secondary | ICD-10-CM | POA: Diagnosis not present

## 2015-01-27 DIAGNOSIS — E1129 Type 2 diabetes mellitus with other diabetic kidney complication: Secondary | ICD-10-CM | POA: Diagnosis not present

## 2015-01-30 DIAGNOSIS — D509 Iron deficiency anemia, unspecified: Secondary | ICD-10-CM | POA: Diagnosis not present

## 2015-01-30 DIAGNOSIS — D631 Anemia in chronic kidney disease: Secondary | ICD-10-CM | POA: Diagnosis not present

## 2015-01-30 DIAGNOSIS — K760 Fatty (change of) liver, not elsewhere classified: Secondary | ICD-10-CM | POA: Diagnosis not present

## 2015-01-30 DIAGNOSIS — E1129 Type 2 diabetes mellitus with other diabetic kidney complication: Secondary | ICD-10-CM | POA: Diagnosis not present

## 2015-01-30 DIAGNOSIS — N2581 Secondary hyperparathyroidism of renal origin: Secondary | ICD-10-CM | POA: Diagnosis not present

## 2015-01-30 DIAGNOSIS — N186 End stage renal disease: Secondary | ICD-10-CM | POA: Diagnosis not present

## 2015-02-01 DIAGNOSIS — E1129 Type 2 diabetes mellitus with other diabetic kidney complication: Secondary | ICD-10-CM | POA: Diagnosis not present

## 2015-02-01 DIAGNOSIS — D631 Anemia in chronic kidney disease: Secondary | ICD-10-CM | POA: Diagnosis not present

## 2015-02-01 DIAGNOSIS — K760 Fatty (change of) liver, not elsewhere classified: Secondary | ICD-10-CM | POA: Diagnosis not present

## 2015-02-01 DIAGNOSIS — N186 End stage renal disease: Secondary | ICD-10-CM | POA: Diagnosis not present

## 2015-02-01 DIAGNOSIS — N2581 Secondary hyperparathyroidism of renal origin: Secondary | ICD-10-CM | POA: Diagnosis not present

## 2015-02-01 DIAGNOSIS — D509 Iron deficiency anemia, unspecified: Secondary | ICD-10-CM | POA: Diagnosis not present

## 2015-02-03 DIAGNOSIS — D631 Anemia in chronic kidney disease: Secondary | ICD-10-CM | POA: Diagnosis not present

## 2015-02-03 DIAGNOSIS — N2581 Secondary hyperparathyroidism of renal origin: Secondary | ICD-10-CM | POA: Diagnosis not present

## 2015-02-03 DIAGNOSIS — E1129 Type 2 diabetes mellitus with other diabetic kidney complication: Secondary | ICD-10-CM | POA: Diagnosis not present

## 2015-02-03 DIAGNOSIS — N186 End stage renal disease: Secondary | ICD-10-CM | POA: Diagnosis not present

## 2015-02-03 DIAGNOSIS — D509 Iron deficiency anemia, unspecified: Secondary | ICD-10-CM | POA: Diagnosis not present

## 2015-02-03 DIAGNOSIS — K760 Fatty (change of) liver, not elsewhere classified: Secondary | ICD-10-CM | POA: Diagnosis not present

## 2015-02-06 DIAGNOSIS — K760 Fatty (change of) liver, not elsewhere classified: Secondary | ICD-10-CM | POA: Diagnosis not present

## 2015-02-06 DIAGNOSIS — D509 Iron deficiency anemia, unspecified: Secondary | ICD-10-CM | POA: Diagnosis not present

## 2015-02-06 DIAGNOSIS — D631 Anemia in chronic kidney disease: Secondary | ICD-10-CM | POA: Diagnosis not present

## 2015-02-06 DIAGNOSIS — E1129 Type 2 diabetes mellitus with other diabetic kidney complication: Secondary | ICD-10-CM | POA: Diagnosis not present

## 2015-02-06 DIAGNOSIS — N186 End stage renal disease: Secondary | ICD-10-CM | POA: Diagnosis not present

## 2015-02-06 DIAGNOSIS — N2581 Secondary hyperparathyroidism of renal origin: Secondary | ICD-10-CM | POA: Diagnosis not present

## 2015-02-08 DIAGNOSIS — N186 End stage renal disease: Secondary | ICD-10-CM | POA: Diagnosis not present

## 2015-02-08 DIAGNOSIS — N2581 Secondary hyperparathyroidism of renal origin: Secondary | ICD-10-CM | POA: Diagnosis not present

## 2015-02-08 DIAGNOSIS — D509 Iron deficiency anemia, unspecified: Secondary | ICD-10-CM | POA: Diagnosis not present

## 2015-02-08 DIAGNOSIS — D631 Anemia in chronic kidney disease: Secondary | ICD-10-CM | POA: Diagnosis not present

## 2015-02-08 DIAGNOSIS — E1129 Type 2 diabetes mellitus with other diabetic kidney complication: Secondary | ICD-10-CM | POA: Diagnosis not present

## 2015-02-08 DIAGNOSIS — K760 Fatty (change of) liver, not elsewhere classified: Secondary | ICD-10-CM | POA: Diagnosis not present

## 2015-02-10 DIAGNOSIS — N186 End stage renal disease: Secondary | ICD-10-CM | POA: Diagnosis not present

## 2015-02-10 DIAGNOSIS — K760 Fatty (change of) liver, not elsewhere classified: Secondary | ICD-10-CM | POA: Diagnosis not present

## 2015-02-10 DIAGNOSIS — E1129 Type 2 diabetes mellitus with other diabetic kidney complication: Secondary | ICD-10-CM | POA: Diagnosis not present

## 2015-02-10 DIAGNOSIS — D509 Iron deficiency anemia, unspecified: Secondary | ICD-10-CM | POA: Diagnosis not present

## 2015-02-10 DIAGNOSIS — D631 Anemia in chronic kidney disease: Secondary | ICD-10-CM | POA: Diagnosis not present

## 2015-02-10 DIAGNOSIS — N2581 Secondary hyperparathyroidism of renal origin: Secondary | ICD-10-CM | POA: Diagnosis not present

## 2015-02-13 DIAGNOSIS — E1129 Type 2 diabetes mellitus with other diabetic kidney complication: Secondary | ICD-10-CM | POA: Diagnosis not present

## 2015-02-13 DIAGNOSIS — N186 End stage renal disease: Secondary | ICD-10-CM | POA: Diagnosis not present

## 2015-02-13 DIAGNOSIS — N2581 Secondary hyperparathyroidism of renal origin: Secondary | ICD-10-CM | POA: Diagnosis not present

## 2015-02-13 DIAGNOSIS — D631 Anemia in chronic kidney disease: Secondary | ICD-10-CM | POA: Diagnosis not present

## 2015-02-13 DIAGNOSIS — K760 Fatty (change of) liver, not elsewhere classified: Secondary | ICD-10-CM | POA: Diagnosis not present

## 2015-02-13 DIAGNOSIS — D509 Iron deficiency anemia, unspecified: Secondary | ICD-10-CM | POA: Diagnosis not present

## 2015-02-15 DIAGNOSIS — D631 Anemia in chronic kidney disease: Secondary | ICD-10-CM | POA: Diagnosis not present

## 2015-02-15 DIAGNOSIS — K760 Fatty (change of) liver, not elsewhere classified: Secondary | ICD-10-CM | POA: Diagnosis not present

## 2015-02-15 DIAGNOSIS — D509 Iron deficiency anemia, unspecified: Secondary | ICD-10-CM | POA: Diagnosis not present

## 2015-02-15 DIAGNOSIS — N2581 Secondary hyperparathyroidism of renal origin: Secondary | ICD-10-CM | POA: Diagnosis not present

## 2015-02-15 DIAGNOSIS — E1129 Type 2 diabetes mellitus with other diabetic kidney complication: Secondary | ICD-10-CM | POA: Diagnosis not present

## 2015-02-15 DIAGNOSIS — N186 End stage renal disease: Secondary | ICD-10-CM | POA: Diagnosis not present

## 2015-02-17 DIAGNOSIS — D631 Anemia in chronic kidney disease: Secondary | ICD-10-CM | POA: Diagnosis not present

## 2015-02-17 DIAGNOSIS — D509 Iron deficiency anemia, unspecified: Secondary | ICD-10-CM | POA: Diagnosis not present

## 2015-02-17 DIAGNOSIS — K760 Fatty (change of) liver, not elsewhere classified: Secondary | ICD-10-CM | POA: Diagnosis not present

## 2015-02-17 DIAGNOSIS — N2581 Secondary hyperparathyroidism of renal origin: Secondary | ICD-10-CM | POA: Diagnosis not present

## 2015-02-17 DIAGNOSIS — N186 End stage renal disease: Secondary | ICD-10-CM | POA: Diagnosis not present

## 2015-02-17 DIAGNOSIS — E1129 Type 2 diabetes mellitus with other diabetic kidney complication: Secondary | ICD-10-CM | POA: Diagnosis not present

## 2015-02-20 DIAGNOSIS — D509 Iron deficiency anemia, unspecified: Secondary | ICD-10-CM | POA: Diagnosis not present

## 2015-02-20 DIAGNOSIS — N186 End stage renal disease: Secondary | ICD-10-CM | POA: Diagnosis not present

## 2015-02-20 DIAGNOSIS — D631 Anemia in chronic kidney disease: Secondary | ICD-10-CM | POA: Diagnosis not present

## 2015-02-20 DIAGNOSIS — E1129 Type 2 diabetes mellitus with other diabetic kidney complication: Secondary | ICD-10-CM | POA: Diagnosis not present

## 2015-02-20 DIAGNOSIS — K760 Fatty (change of) liver, not elsewhere classified: Secondary | ICD-10-CM | POA: Diagnosis not present

## 2015-02-20 DIAGNOSIS — N2581 Secondary hyperparathyroidism of renal origin: Secondary | ICD-10-CM | POA: Diagnosis not present

## 2015-02-22 DIAGNOSIS — N2581 Secondary hyperparathyroidism of renal origin: Secondary | ICD-10-CM | POA: Diagnosis not present

## 2015-02-22 DIAGNOSIS — K760 Fatty (change of) liver, not elsewhere classified: Secondary | ICD-10-CM | POA: Diagnosis not present

## 2015-02-22 DIAGNOSIS — E1129 Type 2 diabetes mellitus with other diabetic kidney complication: Secondary | ICD-10-CM | POA: Diagnosis not present

## 2015-02-22 DIAGNOSIS — D509 Iron deficiency anemia, unspecified: Secondary | ICD-10-CM | POA: Diagnosis not present

## 2015-02-22 DIAGNOSIS — N186 End stage renal disease: Secondary | ICD-10-CM | POA: Diagnosis not present

## 2015-02-22 DIAGNOSIS — D631 Anemia in chronic kidney disease: Secondary | ICD-10-CM | POA: Diagnosis not present

## 2015-02-23 DIAGNOSIS — N186 End stage renal disease: Secondary | ICD-10-CM | POA: Diagnosis not present

## 2015-02-23 DIAGNOSIS — Z992 Dependence on renal dialysis: Secondary | ICD-10-CM | POA: Diagnosis not present

## 2015-02-23 DIAGNOSIS — I120 Hypertensive chronic kidney disease with stage 5 chronic kidney disease or end stage renal disease: Secondary | ICD-10-CM | POA: Diagnosis not present

## 2015-02-24 DIAGNOSIS — N186 End stage renal disease: Secondary | ICD-10-CM | POA: Diagnosis not present

## 2015-02-24 DIAGNOSIS — E1129 Type 2 diabetes mellitus with other diabetic kidney complication: Secondary | ICD-10-CM | POA: Diagnosis not present

## 2015-02-24 DIAGNOSIS — Z23 Encounter for immunization: Secondary | ICD-10-CM | POA: Diagnosis not present

## 2015-02-24 DIAGNOSIS — K760 Fatty (change of) liver, not elsewhere classified: Secondary | ICD-10-CM | POA: Diagnosis not present

## 2015-02-24 DIAGNOSIS — N2581 Secondary hyperparathyroidism of renal origin: Secondary | ICD-10-CM | POA: Diagnosis not present

## 2015-02-24 DIAGNOSIS — D509 Iron deficiency anemia, unspecified: Secondary | ICD-10-CM | POA: Diagnosis not present

## 2015-02-24 DIAGNOSIS — D631 Anemia in chronic kidney disease: Secondary | ICD-10-CM | POA: Diagnosis not present

## 2015-02-27 DIAGNOSIS — N2581 Secondary hyperparathyroidism of renal origin: Secondary | ICD-10-CM | POA: Diagnosis not present

## 2015-02-27 DIAGNOSIS — N186 End stage renal disease: Secondary | ICD-10-CM | POA: Diagnosis not present

## 2015-02-27 DIAGNOSIS — Z23 Encounter for immunization: Secondary | ICD-10-CM | POA: Diagnosis not present

## 2015-02-27 DIAGNOSIS — D509 Iron deficiency anemia, unspecified: Secondary | ICD-10-CM | POA: Diagnosis not present

## 2015-02-27 DIAGNOSIS — E1129 Type 2 diabetes mellitus with other diabetic kidney complication: Secondary | ICD-10-CM | POA: Diagnosis not present

## 2015-02-27 DIAGNOSIS — K760 Fatty (change of) liver, not elsewhere classified: Secondary | ICD-10-CM | POA: Diagnosis not present

## 2015-03-01 DIAGNOSIS — Z23 Encounter for immunization: Secondary | ICD-10-CM | POA: Diagnosis not present

## 2015-03-01 DIAGNOSIS — K760 Fatty (change of) liver, not elsewhere classified: Secondary | ICD-10-CM | POA: Diagnosis not present

## 2015-03-01 DIAGNOSIS — N2581 Secondary hyperparathyroidism of renal origin: Secondary | ICD-10-CM | POA: Diagnosis not present

## 2015-03-01 DIAGNOSIS — E1129 Type 2 diabetes mellitus with other diabetic kidney complication: Secondary | ICD-10-CM | POA: Diagnosis not present

## 2015-03-01 DIAGNOSIS — D509 Iron deficiency anemia, unspecified: Secondary | ICD-10-CM | POA: Diagnosis not present

## 2015-03-01 DIAGNOSIS — N186 End stage renal disease: Secondary | ICD-10-CM | POA: Diagnosis not present

## 2015-03-03 DIAGNOSIS — E1129 Type 2 diabetes mellitus with other diabetic kidney complication: Secondary | ICD-10-CM | POA: Diagnosis not present

## 2015-03-03 DIAGNOSIS — D509 Iron deficiency anemia, unspecified: Secondary | ICD-10-CM | POA: Diagnosis not present

## 2015-03-03 DIAGNOSIS — K760 Fatty (change of) liver, not elsewhere classified: Secondary | ICD-10-CM | POA: Diagnosis not present

## 2015-03-03 DIAGNOSIS — N2581 Secondary hyperparathyroidism of renal origin: Secondary | ICD-10-CM | POA: Diagnosis not present

## 2015-03-03 DIAGNOSIS — N186 End stage renal disease: Secondary | ICD-10-CM | POA: Diagnosis not present

## 2015-03-03 DIAGNOSIS — Z23 Encounter for immunization: Secondary | ICD-10-CM | POA: Diagnosis not present

## 2015-03-06 DIAGNOSIS — K760 Fatty (change of) liver, not elsewhere classified: Secondary | ICD-10-CM | POA: Diagnosis not present

## 2015-03-06 DIAGNOSIS — Z23 Encounter for immunization: Secondary | ICD-10-CM | POA: Diagnosis not present

## 2015-03-06 DIAGNOSIS — E1129 Type 2 diabetes mellitus with other diabetic kidney complication: Secondary | ICD-10-CM | POA: Diagnosis not present

## 2015-03-06 DIAGNOSIS — N186 End stage renal disease: Secondary | ICD-10-CM | POA: Diagnosis not present

## 2015-03-06 DIAGNOSIS — D509 Iron deficiency anemia, unspecified: Secondary | ICD-10-CM | POA: Diagnosis not present

## 2015-03-06 DIAGNOSIS — N2581 Secondary hyperparathyroidism of renal origin: Secondary | ICD-10-CM | POA: Diagnosis not present

## 2015-03-08 DIAGNOSIS — Z23 Encounter for immunization: Secondary | ICD-10-CM | POA: Diagnosis not present

## 2015-03-08 DIAGNOSIS — K760 Fatty (change of) liver, not elsewhere classified: Secondary | ICD-10-CM | POA: Diagnosis not present

## 2015-03-08 DIAGNOSIS — D509 Iron deficiency anemia, unspecified: Secondary | ICD-10-CM | POA: Diagnosis not present

## 2015-03-08 DIAGNOSIS — N186 End stage renal disease: Secondary | ICD-10-CM | POA: Diagnosis not present

## 2015-03-08 DIAGNOSIS — E1129 Type 2 diabetes mellitus with other diabetic kidney complication: Secondary | ICD-10-CM | POA: Diagnosis not present

## 2015-03-08 DIAGNOSIS — N2581 Secondary hyperparathyroidism of renal origin: Secondary | ICD-10-CM | POA: Diagnosis not present

## 2015-03-10 DIAGNOSIS — D509 Iron deficiency anemia, unspecified: Secondary | ICD-10-CM | POA: Diagnosis not present

## 2015-03-10 DIAGNOSIS — N186 End stage renal disease: Secondary | ICD-10-CM | POA: Diagnosis not present

## 2015-03-10 DIAGNOSIS — E1129 Type 2 diabetes mellitus with other diabetic kidney complication: Secondary | ICD-10-CM | POA: Diagnosis not present

## 2015-03-10 DIAGNOSIS — Z23 Encounter for immunization: Secondary | ICD-10-CM | POA: Diagnosis not present

## 2015-03-10 DIAGNOSIS — N2581 Secondary hyperparathyroidism of renal origin: Secondary | ICD-10-CM | POA: Diagnosis not present

## 2015-03-10 DIAGNOSIS — K760 Fatty (change of) liver, not elsewhere classified: Secondary | ICD-10-CM | POA: Diagnosis not present

## 2015-03-13 DIAGNOSIS — N186 End stage renal disease: Secondary | ICD-10-CM | POA: Diagnosis not present

## 2015-03-13 DIAGNOSIS — E1129 Type 2 diabetes mellitus with other diabetic kidney complication: Secondary | ICD-10-CM | POA: Diagnosis not present

## 2015-03-13 DIAGNOSIS — K760 Fatty (change of) liver, not elsewhere classified: Secondary | ICD-10-CM | POA: Diagnosis not present

## 2015-03-13 DIAGNOSIS — N2581 Secondary hyperparathyroidism of renal origin: Secondary | ICD-10-CM | POA: Diagnosis not present

## 2015-03-13 DIAGNOSIS — Z23 Encounter for immunization: Secondary | ICD-10-CM | POA: Diagnosis not present

## 2015-03-13 DIAGNOSIS — D509 Iron deficiency anemia, unspecified: Secondary | ICD-10-CM | POA: Diagnosis not present

## 2015-03-15 DIAGNOSIS — N186 End stage renal disease: Secondary | ICD-10-CM | POA: Diagnosis not present

## 2015-03-15 DIAGNOSIS — D509 Iron deficiency anemia, unspecified: Secondary | ICD-10-CM | POA: Diagnosis not present

## 2015-03-15 DIAGNOSIS — N2581 Secondary hyperparathyroidism of renal origin: Secondary | ICD-10-CM | POA: Diagnosis not present

## 2015-03-15 DIAGNOSIS — Z23 Encounter for immunization: Secondary | ICD-10-CM | POA: Diagnosis not present

## 2015-03-15 DIAGNOSIS — E1129 Type 2 diabetes mellitus with other diabetic kidney complication: Secondary | ICD-10-CM | POA: Diagnosis not present

## 2015-03-15 DIAGNOSIS — K760 Fatty (change of) liver, not elsewhere classified: Secondary | ICD-10-CM | POA: Diagnosis not present

## 2015-03-17 DIAGNOSIS — Z23 Encounter for immunization: Secondary | ICD-10-CM | POA: Diagnosis not present

## 2015-03-17 DIAGNOSIS — K760 Fatty (change of) liver, not elsewhere classified: Secondary | ICD-10-CM | POA: Diagnosis not present

## 2015-03-17 DIAGNOSIS — D509 Iron deficiency anemia, unspecified: Secondary | ICD-10-CM | POA: Diagnosis not present

## 2015-03-17 DIAGNOSIS — N2581 Secondary hyperparathyroidism of renal origin: Secondary | ICD-10-CM | POA: Diagnosis not present

## 2015-03-17 DIAGNOSIS — N186 End stage renal disease: Secondary | ICD-10-CM | POA: Diagnosis not present

## 2015-03-17 DIAGNOSIS — E1129 Type 2 diabetes mellitus with other diabetic kidney complication: Secondary | ICD-10-CM | POA: Diagnosis not present

## 2015-03-20 DIAGNOSIS — E1129 Type 2 diabetes mellitus with other diabetic kidney complication: Secondary | ICD-10-CM | POA: Diagnosis not present

## 2015-03-20 DIAGNOSIS — K760 Fatty (change of) liver, not elsewhere classified: Secondary | ICD-10-CM | POA: Diagnosis not present

## 2015-03-20 DIAGNOSIS — N186 End stage renal disease: Secondary | ICD-10-CM | POA: Diagnosis not present

## 2015-03-20 DIAGNOSIS — N2581 Secondary hyperparathyroidism of renal origin: Secondary | ICD-10-CM | POA: Diagnosis not present

## 2015-03-20 DIAGNOSIS — Z23 Encounter for immunization: Secondary | ICD-10-CM | POA: Diagnosis not present

## 2015-03-20 DIAGNOSIS — D509 Iron deficiency anemia, unspecified: Secondary | ICD-10-CM | POA: Diagnosis not present

## 2015-03-22 DIAGNOSIS — N2581 Secondary hyperparathyroidism of renal origin: Secondary | ICD-10-CM | POA: Diagnosis not present

## 2015-03-22 DIAGNOSIS — E1129 Type 2 diabetes mellitus with other diabetic kidney complication: Secondary | ICD-10-CM | POA: Diagnosis not present

## 2015-03-22 DIAGNOSIS — D509 Iron deficiency anemia, unspecified: Secondary | ICD-10-CM | POA: Diagnosis not present

## 2015-03-22 DIAGNOSIS — Z23 Encounter for immunization: Secondary | ICD-10-CM | POA: Diagnosis not present

## 2015-03-22 DIAGNOSIS — N186 End stage renal disease: Secondary | ICD-10-CM | POA: Diagnosis not present

## 2015-03-22 DIAGNOSIS — K760 Fatty (change of) liver, not elsewhere classified: Secondary | ICD-10-CM | POA: Diagnosis not present

## 2015-03-24 DIAGNOSIS — D509 Iron deficiency anemia, unspecified: Secondary | ICD-10-CM | POA: Diagnosis not present

## 2015-03-24 DIAGNOSIS — K760 Fatty (change of) liver, not elsewhere classified: Secondary | ICD-10-CM | POA: Diagnosis not present

## 2015-03-24 DIAGNOSIS — Z23 Encounter for immunization: Secondary | ICD-10-CM | POA: Diagnosis not present

## 2015-03-24 DIAGNOSIS — N186 End stage renal disease: Secondary | ICD-10-CM | POA: Diagnosis not present

## 2015-03-24 DIAGNOSIS — E1129 Type 2 diabetes mellitus with other diabetic kidney complication: Secondary | ICD-10-CM | POA: Diagnosis not present

## 2015-03-24 DIAGNOSIS — N2581 Secondary hyperparathyroidism of renal origin: Secondary | ICD-10-CM | POA: Diagnosis not present

## 2015-03-26 DIAGNOSIS — N186 End stage renal disease: Secondary | ICD-10-CM | POA: Diagnosis not present

## 2015-03-26 DIAGNOSIS — Z992 Dependence on renal dialysis: Secondary | ICD-10-CM | POA: Diagnosis not present

## 2015-03-26 DIAGNOSIS — I120 Hypertensive chronic kidney disease with stage 5 chronic kidney disease or end stage renal disease: Secondary | ICD-10-CM | POA: Diagnosis not present

## 2015-03-27 DIAGNOSIS — E1129 Type 2 diabetes mellitus with other diabetic kidney complication: Secondary | ICD-10-CM | POA: Diagnosis not present

## 2015-03-27 DIAGNOSIS — D509 Iron deficiency anemia, unspecified: Secondary | ICD-10-CM | POA: Diagnosis not present

## 2015-03-27 DIAGNOSIS — N186 End stage renal disease: Secondary | ICD-10-CM | POA: Diagnosis not present

## 2015-03-27 DIAGNOSIS — N2581 Secondary hyperparathyroidism of renal origin: Secondary | ICD-10-CM | POA: Diagnosis not present

## 2015-03-27 DIAGNOSIS — K760 Fatty (change of) liver, not elsewhere classified: Secondary | ICD-10-CM | POA: Diagnosis not present

## 2015-04-25 DIAGNOSIS — I120 Hypertensive chronic kidney disease with stage 5 chronic kidney disease or end stage renal disease: Secondary | ICD-10-CM | POA: Diagnosis not present

## 2015-04-25 DIAGNOSIS — Z992 Dependence on renal dialysis: Secondary | ICD-10-CM | POA: Diagnosis not present

## 2015-04-25 DIAGNOSIS — N186 End stage renal disease: Secondary | ICD-10-CM | POA: Diagnosis not present

## 2015-04-26 DIAGNOSIS — N2581 Secondary hyperparathyroidism of renal origin: Secondary | ICD-10-CM | POA: Diagnosis not present

## 2015-04-26 DIAGNOSIS — D509 Iron deficiency anemia, unspecified: Secondary | ICD-10-CM | POA: Diagnosis not present

## 2015-04-26 DIAGNOSIS — D631 Anemia in chronic kidney disease: Secondary | ICD-10-CM | POA: Diagnosis not present

## 2015-04-26 DIAGNOSIS — E1129 Type 2 diabetes mellitus with other diabetic kidney complication: Secondary | ICD-10-CM | POA: Diagnosis not present

## 2015-04-26 DIAGNOSIS — K760 Fatty (change of) liver, not elsewhere classified: Secondary | ICD-10-CM | POA: Diagnosis not present

## 2015-04-26 DIAGNOSIS — N186 End stage renal disease: Secondary | ICD-10-CM | POA: Diagnosis not present

## 2015-05-26 DIAGNOSIS — I120 Hypertensive chronic kidney disease with stage 5 chronic kidney disease or end stage renal disease: Secondary | ICD-10-CM | POA: Diagnosis not present

## 2015-05-26 DIAGNOSIS — Z992 Dependence on renal dialysis: Secondary | ICD-10-CM | POA: Diagnosis not present

## 2015-05-26 DIAGNOSIS — N186 End stage renal disease: Secondary | ICD-10-CM | POA: Diagnosis not present

## 2015-05-29 DIAGNOSIS — D509 Iron deficiency anemia, unspecified: Secondary | ICD-10-CM | POA: Diagnosis not present

## 2015-05-29 DIAGNOSIS — K760 Fatty (change of) liver, not elsewhere classified: Secondary | ICD-10-CM | POA: Diagnosis not present

## 2015-05-29 DIAGNOSIS — N186 End stage renal disease: Secondary | ICD-10-CM | POA: Diagnosis not present

## 2015-05-29 DIAGNOSIS — N2581 Secondary hyperparathyroidism of renal origin: Secondary | ICD-10-CM | POA: Diagnosis not present

## 2015-05-29 DIAGNOSIS — D631 Anemia in chronic kidney disease: Secondary | ICD-10-CM | POA: Diagnosis not present

## 2015-05-29 DIAGNOSIS — E119 Type 2 diabetes mellitus without complications: Secondary | ICD-10-CM | POA: Diagnosis not present

## 2015-05-31 DIAGNOSIS — N186 End stage renal disease: Secondary | ICD-10-CM | POA: Diagnosis not present

## 2015-05-31 DIAGNOSIS — D509 Iron deficiency anemia, unspecified: Secondary | ICD-10-CM | POA: Diagnosis not present

## 2015-05-31 DIAGNOSIS — D631 Anemia in chronic kidney disease: Secondary | ICD-10-CM | POA: Diagnosis not present

## 2015-05-31 DIAGNOSIS — E119 Type 2 diabetes mellitus without complications: Secondary | ICD-10-CM | POA: Diagnosis not present

## 2015-05-31 DIAGNOSIS — K760 Fatty (change of) liver, not elsewhere classified: Secondary | ICD-10-CM | POA: Diagnosis not present

## 2015-05-31 DIAGNOSIS — N2581 Secondary hyperparathyroidism of renal origin: Secondary | ICD-10-CM | POA: Diagnosis not present

## 2015-06-05 DIAGNOSIS — E119 Type 2 diabetes mellitus without complications: Secondary | ICD-10-CM | POA: Diagnosis not present

## 2015-06-05 DIAGNOSIS — N2581 Secondary hyperparathyroidism of renal origin: Secondary | ICD-10-CM | POA: Diagnosis not present

## 2015-06-05 DIAGNOSIS — N186 End stage renal disease: Secondary | ICD-10-CM | POA: Diagnosis not present

## 2015-06-05 DIAGNOSIS — D631 Anemia in chronic kidney disease: Secondary | ICD-10-CM | POA: Diagnosis not present

## 2015-06-05 DIAGNOSIS — D509 Iron deficiency anemia, unspecified: Secondary | ICD-10-CM | POA: Diagnosis not present

## 2015-06-05 DIAGNOSIS — K760 Fatty (change of) liver, not elsewhere classified: Secondary | ICD-10-CM | POA: Diagnosis not present

## 2015-06-07 DIAGNOSIS — N186 End stage renal disease: Secondary | ICD-10-CM | POA: Diagnosis not present

## 2015-06-07 DIAGNOSIS — K760 Fatty (change of) liver, not elsewhere classified: Secondary | ICD-10-CM | POA: Diagnosis not present

## 2015-06-07 DIAGNOSIS — E1129 Type 2 diabetes mellitus with other diabetic kidney complication: Secondary | ICD-10-CM | POA: Diagnosis not present

## 2015-06-07 DIAGNOSIS — D509 Iron deficiency anemia, unspecified: Secondary | ICD-10-CM | POA: Diagnosis not present

## 2015-06-07 DIAGNOSIS — E119 Type 2 diabetes mellitus without complications: Secondary | ICD-10-CM | POA: Diagnosis not present

## 2015-06-07 DIAGNOSIS — D631 Anemia in chronic kidney disease: Secondary | ICD-10-CM | POA: Diagnosis not present

## 2015-06-07 DIAGNOSIS — N2581 Secondary hyperparathyroidism of renal origin: Secondary | ICD-10-CM | POA: Diagnosis not present

## 2015-06-09 DIAGNOSIS — E119 Type 2 diabetes mellitus without complications: Secondary | ICD-10-CM | POA: Diagnosis not present

## 2015-06-09 DIAGNOSIS — K760 Fatty (change of) liver, not elsewhere classified: Secondary | ICD-10-CM | POA: Diagnosis not present

## 2015-06-09 DIAGNOSIS — N2581 Secondary hyperparathyroidism of renal origin: Secondary | ICD-10-CM | POA: Diagnosis not present

## 2015-06-09 DIAGNOSIS — N186 End stage renal disease: Secondary | ICD-10-CM | POA: Diagnosis not present

## 2015-06-09 DIAGNOSIS — D509 Iron deficiency anemia, unspecified: Secondary | ICD-10-CM | POA: Diagnosis not present

## 2015-06-09 DIAGNOSIS — D631 Anemia in chronic kidney disease: Secondary | ICD-10-CM | POA: Diagnosis not present

## 2015-06-12 DIAGNOSIS — N186 End stage renal disease: Secondary | ICD-10-CM | POA: Diagnosis not present

## 2015-06-12 DIAGNOSIS — D631 Anemia in chronic kidney disease: Secondary | ICD-10-CM | POA: Diagnosis not present

## 2015-06-12 DIAGNOSIS — E119 Type 2 diabetes mellitus without complications: Secondary | ICD-10-CM | POA: Diagnosis not present

## 2015-06-12 DIAGNOSIS — K760 Fatty (change of) liver, not elsewhere classified: Secondary | ICD-10-CM | POA: Diagnosis not present

## 2015-06-12 DIAGNOSIS — N2581 Secondary hyperparathyroidism of renal origin: Secondary | ICD-10-CM | POA: Diagnosis not present

## 2015-06-12 DIAGNOSIS — D509 Iron deficiency anemia, unspecified: Secondary | ICD-10-CM | POA: Diagnosis not present

## 2015-06-14 DIAGNOSIS — D631 Anemia in chronic kidney disease: Secondary | ICD-10-CM | POA: Diagnosis not present

## 2015-06-14 DIAGNOSIS — N2581 Secondary hyperparathyroidism of renal origin: Secondary | ICD-10-CM | POA: Diagnosis not present

## 2015-06-14 DIAGNOSIS — N186 End stage renal disease: Secondary | ICD-10-CM | POA: Diagnosis not present

## 2015-06-14 DIAGNOSIS — K760 Fatty (change of) liver, not elsewhere classified: Secondary | ICD-10-CM | POA: Diagnosis not present

## 2015-06-14 DIAGNOSIS — D509 Iron deficiency anemia, unspecified: Secondary | ICD-10-CM | POA: Diagnosis not present

## 2015-06-14 DIAGNOSIS — E119 Type 2 diabetes mellitus without complications: Secondary | ICD-10-CM | POA: Diagnosis not present

## 2015-06-16 DIAGNOSIS — D631 Anemia in chronic kidney disease: Secondary | ICD-10-CM | POA: Diagnosis not present

## 2015-06-16 DIAGNOSIS — D509 Iron deficiency anemia, unspecified: Secondary | ICD-10-CM | POA: Diagnosis not present

## 2015-06-16 DIAGNOSIS — N186 End stage renal disease: Secondary | ICD-10-CM | POA: Diagnosis not present

## 2015-06-16 DIAGNOSIS — E119 Type 2 diabetes mellitus without complications: Secondary | ICD-10-CM | POA: Diagnosis not present

## 2015-06-16 DIAGNOSIS — K760 Fatty (change of) liver, not elsewhere classified: Secondary | ICD-10-CM | POA: Diagnosis not present

## 2015-06-16 DIAGNOSIS — N2581 Secondary hyperparathyroidism of renal origin: Secondary | ICD-10-CM | POA: Diagnosis not present

## 2015-06-19 DIAGNOSIS — K760 Fatty (change of) liver, not elsewhere classified: Secondary | ICD-10-CM | POA: Diagnosis not present

## 2015-06-19 DIAGNOSIS — E119 Type 2 diabetes mellitus without complications: Secondary | ICD-10-CM | POA: Diagnosis not present

## 2015-06-19 DIAGNOSIS — N2581 Secondary hyperparathyroidism of renal origin: Secondary | ICD-10-CM | POA: Diagnosis not present

## 2015-06-19 DIAGNOSIS — D631 Anemia in chronic kidney disease: Secondary | ICD-10-CM | POA: Diagnosis not present

## 2015-06-19 DIAGNOSIS — D509 Iron deficiency anemia, unspecified: Secondary | ICD-10-CM | POA: Diagnosis not present

## 2015-06-19 DIAGNOSIS — N186 End stage renal disease: Secondary | ICD-10-CM | POA: Diagnosis not present

## 2015-06-21 DIAGNOSIS — K760 Fatty (change of) liver, not elsewhere classified: Secondary | ICD-10-CM | POA: Diagnosis not present

## 2015-06-21 DIAGNOSIS — D631 Anemia in chronic kidney disease: Secondary | ICD-10-CM | POA: Diagnosis not present

## 2015-06-21 DIAGNOSIS — N2581 Secondary hyperparathyroidism of renal origin: Secondary | ICD-10-CM | POA: Diagnosis not present

## 2015-06-21 DIAGNOSIS — N186 End stage renal disease: Secondary | ICD-10-CM | POA: Diagnosis not present

## 2015-06-21 DIAGNOSIS — E119 Type 2 diabetes mellitus without complications: Secondary | ICD-10-CM | POA: Diagnosis not present

## 2015-06-21 DIAGNOSIS — D509 Iron deficiency anemia, unspecified: Secondary | ICD-10-CM | POA: Diagnosis not present

## 2015-06-23 DIAGNOSIS — D509 Iron deficiency anemia, unspecified: Secondary | ICD-10-CM | POA: Diagnosis not present

## 2015-06-23 DIAGNOSIS — D631 Anemia in chronic kidney disease: Secondary | ICD-10-CM | POA: Diagnosis not present

## 2015-06-23 DIAGNOSIS — K760 Fatty (change of) liver, not elsewhere classified: Secondary | ICD-10-CM | POA: Diagnosis not present

## 2015-06-23 DIAGNOSIS — E119 Type 2 diabetes mellitus without complications: Secondary | ICD-10-CM | POA: Diagnosis not present

## 2015-06-23 DIAGNOSIS — N2581 Secondary hyperparathyroidism of renal origin: Secondary | ICD-10-CM | POA: Diagnosis not present

## 2015-06-23 DIAGNOSIS — N186 End stage renal disease: Secondary | ICD-10-CM | POA: Diagnosis not present

## 2015-06-26 DIAGNOSIS — Z992 Dependence on renal dialysis: Secondary | ICD-10-CM | POA: Diagnosis not present

## 2015-06-26 DIAGNOSIS — N2581 Secondary hyperparathyroidism of renal origin: Secondary | ICD-10-CM | POA: Diagnosis not present

## 2015-06-26 DIAGNOSIS — E119 Type 2 diabetes mellitus without complications: Secondary | ICD-10-CM | POA: Diagnosis not present

## 2015-06-26 DIAGNOSIS — I120 Hypertensive chronic kidney disease with stage 5 chronic kidney disease or end stage renal disease: Secondary | ICD-10-CM | POA: Diagnosis not present

## 2015-06-26 DIAGNOSIS — K760 Fatty (change of) liver, not elsewhere classified: Secondary | ICD-10-CM | POA: Diagnosis not present

## 2015-06-26 DIAGNOSIS — N186 End stage renal disease: Secondary | ICD-10-CM | POA: Diagnosis not present

## 2015-06-26 DIAGNOSIS — D509 Iron deficiency anemia, unspecified: Secondary | ICD-10-CM | POA: Diagnosis not present

## 2015-06-26 DIAGNOSIS — D631 Anemia in chronic kidney disease: Secondary | ICD-10-CM | POA: Diagnosis not present

## 2015-06-28 DIAGNOSIS — D509 Iron deficiency anemia, unspecified: Secondary | ICD-10-CM | POA: Diagnosis not present

## 2015-06-28 DIAGNOSIS — N2581 Secondary hyperparathyroidism of renal origin: Secondary | ICD-10-CM | POA: Diagnosis not present

## 2015-06-28 DIAGNOSIS — N186 End stage renal disease: Secondary | ICD-10-CM | POA: Diagnosis not present

## 2015-06-28 DIAGNOSIS — E119 Type 2 diabetes mellitus without complications: Secondary | ICD-10-CM | POA: Diagnosis not present

## 2015-06-30 DIAGNOSIS — N2581 Secondary hyperparathyroidism of renal origin: Secondary | ICD-10-CM | POA: Diagnosis not present

## 2015-06-30 DIAGNOSIS — D509 Iron deficiency anemia, unspecified: Secondary | ICD-10-CM | POA: Diagnosis not present

## 2015-06-30 DIAGNOSIS — N186 End stage renal disease: Secondary | ICD-10-CM | POA: Diagnosis not present

## 2015-06-30 DIAGNOSIS — E119 Type 2 diabetes mellitus without complications: Secondary | ICD-10-CM | POA: Diagnosis not present

## 2015-07-03 DIAGNOSIS — D509 Iron deficiency anemia, unspecified: Secondary | ICD-10-CM | POA: Diagnosis not present

## 2015-07-03 DIAGNOSIS — N2581 Secondary hyperparathyroidism of renal origin: Secondary | ICD-10-CM | POA: Diagnosis not present

## 2015-07-03 DIAGNOSIS — N186 End stage renal disease: Secondary | ICD-10-CM | POA: Diagnosis not present

## 2015-07-03 DIAGNOSIS — E119 Type 2 diabetes mellitus without complications: Secondary | ICD-10-CM | POA: Diagnosis not present

## 2015-07-05 DIAGNOSIS — D509 Iron deficiency anemia, unspecified: Secondary | ICD-10-CM | POA: Diagnosis not present

## 2015-07-05 DIAGNOSIS — E119 Type 2 diabetes mellitus without complications: Secondary | ICD-10-CM | POA: Diagnosis not present

## 2015-07-05 DIAGNOSIS — N186 End stage renal disease: Secondary | ICD-10-CM | POA: Diagnosis not present

## 2015-07-05 DIAGNOSIS — N2581 Secondary hyperparathyroidism of renal origin: Secondary | ICD-10-CM | POA: Diagnosis not present

## 2015-07-07 DIAGNOSIS — D509 Iron deficiency anemia, unspecified: Secondary | ICD-10-CM | POA: Diagnosis not present

## 2015-07-07 DIAGNOSIS — N2581 Secondary hyperparathyroidism of renal origin: Secondary | ICD-10-CM | POA: Diagnosis not present

## 2015-07-07 DIAGNOSIS — N186 End stage renal disease: Secondary | ICD-10-CM | POA: Diagnosis not present

## 2015-07-07 DIAGNOSIS — E119 Type 2 diabetes mellitus without complications: Secondary | ICD-10-CM | POA: Diagnosis not present

## 2015-07-10 DIAGNOSIS — N186 End stage renal disease: Secondary | ICD-10-CM | POA: Diagnosis not present

## 2015-07-10 DIAGNOSIS — N2581 Secondary hyperparathyroidism of renal origin: Secondary | ICD-10-CM | POA: Diagnosis not present

## 2015-07-10 DIAGNOSIS — E119 Type 2 diabetes mellitus without complications: Secondary | ICD-10-CM | POA: Diagnosis not present

## 2015-07-10 DIAGNOSIS — D509 Iron deficiency anemia, unspecified: Secondary | ICD-10-CM | POA: Diagnosis not present

## 2015-07-12 DIAGNOSIS — E119 Type 2 diabetes mellitus without complications: Secondary | ICD-10-CM | POA: Diagnosis not present

## 2015-07-12 DIAGNOSIS — N186 End stage renal disease: Secondary | ICD-10-CM | POA: Diagnosis not present

## 2015-07-12 DIAGNOSIS — N2581 Secondary hyperparathyroidism of renal origin: Secondary | ICD-10-CM | POA: Diagnosis not present

## 2015-07-12 DIAGNOSIS — D509 Iron deficiency anemia, unspecified: Secondary | ICD-10-CM | POA: Diagnosis not present

## 2015-07-14 DIAGNOSIS — D509 Iron deficiency anemia, unspecified: Secondary | ICD-10-CM | POA: Diagnosis not present

## 2015-07-14 DIAGNOSIS — E119 Type 2 diabetes mellitus without complications: Secondary | ICD-10-CM | POA: Diagnosis not present

## 2015-07-14 DIAGNOSIS — N186 End stage renal disease: Secondary | ICD-10-CM | POA: Diagnosis not present

## 2015-07-14 DIAGNOSIS — N2581 Secondary hyperparathyroidism of renal origin: Secondary | ICD-10-CM | POA: Diagnosis not present

## 2015-07-17 DIAGNOSIS — D509 Iron deficiency anemia, unspecified: Secondary | ICD-10-CM | POA: Diagnosis not present

## 2015-07-17 DIAGNOSIS — N2581 Secondary hyperparathyroidism of renal origin: Secondary | ICD-10-CM | POA: Diagnosis not present

## 2015-07-17 DIAGNOSIS — E119 Type 2 diabetes mellitus without complications: Secondary | ICD-10-CM | POA: Diagnosis not present

## 2015-07-17 DIAGNOSIS — N186 End stage renal disease: Secondary | ICD-10-CM | POA: Diagnosis not present

## 2015-07-19 DIAGNOSIS — D509 Iron deficiency anemia, unspecified: Secondary | ICD-10-CM | POA: Diagnosis not present

## 2015-07-19 DIAGNOSIS — N186 End stage renal disease: Secondary | ICD-10-CM | POA: Diagnosis not present

## 2015-07-19 DIAGNOSIS — E119 Type 2 diabetes mellitus without complications: Secondary | ICD-10-CM | POA: Diagnosis not present

## 2015-07-19 DIAGNOSIS — N2581 Secondary hyperparathyroidism of renal origin: Secondary | ICD-10-CM | POA: Diagnosis not present

## 2015-07-21 DIAGNOSIS — E119 Type 2 diabetes mellitus without complications: Secondary | ICD-10-CM | POA: Diagnosis not present

## 2015-07-21 DIAGNOSIS — N2581 Secondary hyperparathyroidism of renal origin: Secondary | ICD-10-CM | POA: Diagnosis not present

## 2015-07-21 DIAGNOSIS — D509 Iron deficiency anemia, unspecified: Secondary | ICD-10-CM | POA: Diagnosis not present

## 2015-07-21 DIAGNOSIS — N186 End stage renal disease: Secondary | ICD-10-CM | POA: Diagnosis not present

## 2015-07-24 DIAGNOSIS — I120 Hypertensive chronic kidney disease with stage 5 chronic kidney disease or end stage renal disease: Secondary | ICD-10-CM | POA: Diagnosis not present

## 2015-07-24 DIAGNOSIS — N186 End stage renal disease: Secondary | ICD-10-CM | POA: Diagnosis not present

## 2015-07-24 DIAGNOSIS — E119 Type 2 diabetes mellitus without complications: Secondary | ICD-10-CM | POA: Diagnosis not present

## 2015-07-24 DIAGNOSIS — N2581 Secondary hyperparathyroidism of renal origin: Secondary | ICD-10-CM | POA: Diagnosis not present

## 2015-07-24 DIAGNOSIS — D509 Iron deficiency anemia, unspecified: Secondary | ICD-10-CM | POA: Diagnosis not present

## 2015-07-24 DIAGNOSIS — Z992 Dependence on renal dialysis: Secondary | ICD-10-CM | POA: Diagnosis not present

## 2015-07-26 DIAGNOSIS — N2581 Secondary hyperparathyroidism of renal origin: Secondary | ICD-10-CM | POA: Diagnosis not present

## 2015-07-26 DIAGNOSIS — N186 End stage renal disease: Secondary | ICD-10-CM | POA: Diagnosis not present

## 2015-07-26 DIAGNOSIS — D509 Iron deficiency anemia, unspecified: Secondary | ICD-10-CM | POA: Diagnosis not present

## 2015-07-26 DIAGNOSIS — E119 Type 2 diabetes mellitus without complications: Secondary | ICD-10-CM | POA: Diagnosis not present

## 2015-07-28 DIAGNOSIS — D509 Iron deficiency anemia, unspecified: Secondary | ICD-10-CM | POA: Diagnosis not present

## 2015-07-28 DIAGNOSIS — E119 Type 2 diabetes mellitus without complications: Secondary | ICD-10-CM | POA: Diagnosis not present

## 2015-07-28 DIAGNOSIS — N186 End stage renal disease: Secondary | ICD-10-CM | POA: Diagnosis not present

## 2015-07-28 DIAGNOSIS — N2581 Secondary hyperparathyroidism of renal origin: Secondary | ICD-10-CM | POA: Diagnosis not present

## 2015-07-31 DIAGNOSIS — N2581 Secondary hyperparathyroidism of renal origin: Secondary | ICD-10-CM | POA: Diagnosis not present

## 2015-07-31 DIAGNOSIS — N186 End stage renal disease: Secondary | ICD-10-CM | POA: Diagnosis not present

## 2015-07-31 DIAGNOSIS — E119 Type 2 diabetes mellitus without complications: Secondary | ICD-10-CM | POA: Diagnosis not present

## 2015-07-31 DIAGNOSIS — D509 Iron deficiency anemia, unspecified: Secondary | ICD-10-CM | POA: Diagnosis not present

## 2015-08-02 DIAGNOSIS — E119 Type 2 diabetes mellitus without complications: Secondary | ICD-10-CM | POA: Diagnosis not present

## 2015-08-02 DIAGNOSIS — D509 Iron deficiency anemia, unspecified: Secondary | ICD-10-CM | POA: Diagnosis not present

## 2015-08-02 DIAGNOSIS — N2581 Secondary hyperparathyroidism of renal origin: Secondary | ICD-10-CM | POA: Diagnosis not present

## 2015-08-02 DIAGNOSIS — N186 End stage renal disease: Secondary | ICD-10-CM | POA: Diagnosis not present

## 2015-08-04 DIAGNOSIS — N2581 Secondary hyperparathyroidism of renal origin: Secondary | ICD-10-CM | POA: Diagnosis not present

## 2015-08-04 DIAGNOSIS — D509 Iron deficiency anemia, unspecified: Secondary | ICD-10-CM | POA: Diagnosis not present

## 2015-08-04 DIAGNOSIS — E119 Type 2 diabetes mellitus without complications: Secondary | ICD-10-CM | POA: Diagnosis not present

## 2015-08-04 DIAGNOSIS — N186 End stage renal disease: Secondary | ICD-10-CM | POA: Diagnosis not present

## 2015-08-07 DIAGNOSIS — D509 Iron deficiency anemia, unspecified: Secondary | ICD-10-CM | POA: Diagnosis not present

## 2015-08-07 DIAGNOSIS — N2581 Secondary hyperparathyroidism of renal origin: Secondary | ICD-10-CM | POA: Diagnosis not present

## 2015-08-07 DIAGNOSIS — E119 Type 2 diabetes mellitus without complications: Secondary | ICD-10-CM | POA: Diagnosis not present

## 2015-08-07 DIAGNOSIS — N186 End stage renal disease: Secondary | ICD-10-CM | POA: Diagnosis not present

## 2015-08-09 DIAGNOSIS — E119 Type 2 diabetes mellitus without complications: Secondary | ICD-10-CM | POA: Diagnosis not present

## 2015-08-09 DIAGNOSIS — D509 Iron deficiency anemia, unspecified: Secondary | ICD-10-CM | POA: Diagnosis not present

## 2015-08-09 DIAGNOSIS — N186 End stage renal disease: Secondary | ICD-10-CM | POA: Diagnosis not present

## 2015-08-09 DIAGNOSIS — N2581 Secondary hyperparathyroidism of renal origin: Secondary | ICD-10-CM | POA: Diagnosis not present

## 2015-08-11 DIAGNOSIS — N2581 Secondary hyperparathyroidism of renal origin: Secondary | ICD-10-CM | POA: Diagnosis not present

## 2015-08-11 DIAGNOSIS — N186 End stage renal disease: Secondary | ICD-10-CM | POA: Diagnosis not present

## 2015-08-11 DIAGNOSIS — E119 Type 2 diabetes mellitus without complications: Secondary | ICD-10-CM | POA: Diagnosis not present

## 2015-08-11 DIAGNOSIS — D509 Iron deficiency anemia, unspecified: Secondary | ICD-10-CM | POA: Diagnosis not present

## 2015-08-14 DIAGNOSIS — N186 End stage renal disease: Secondary | ICD-10-CM | POA: Diagnosis not present

## 2015-08-14 DIAGNOSIS — N2581 Secondary hyperparathyroidism of renal origin: Secondary | ICD-10-CM | POA: Diagnosis not present

## 2015-08-14 DIAGNOSIS — E119 Type 2 diabetes mellitus without complications: Secondary | ICD-10-CM | POA: Diagnosis not present

## 2015-08-14 DIAGNOSIS — D509 Iron deficiency anemia, unspecified: Secondary | ICD-10-CM | POA: Diagnosis not present

## 2015-08-16 DIAGNOSIS — N186 End stage renal disease: Secondary | ICD-10-CM | POA: Diagnosis not present

## 2015-08-16 DIAGNOSIS — E119 Type 2 diabetes mellitus without complications: Secondary | ICD-10-CM | POA: Diagnosis not present

## 2015-08-16 DIAGNOSIS — D509 Iron deficiency anemia, unspecified: Secondary | ICD-10-CM | POA: Diagnosis not present

## 2015-08-16 DIAGNOSIS — N2581 Secondary hyperparathyroidism of renal origin: Secondary | ICD-10-CM | POA: Diagnosis not present

## 2015-08-18 DIAGNOSIS — N2581 Secondary hyperparathyroidism of renal origin: Secondary | ICD-10-CM | POA: Diagnosis not present

## 2015-08-18 DIAGNOSIS — D509 Iron deficiency anemia, unspecified: Secondary | ICD-10-CM | POA: Diagnosis not present

## 2015-08-18 DIAGNOSIS — N186 End stage renal disease: Secondary | ICD-10-CM | POA: Diagnosis not present

## 2015-08-18 DIAGNOSIS — E119 Type 2 diabetes mellitus without complications: Secondary | ICD-10-CM | POA: Diagnosis not present

## 2015-08-21 DIAGNOSIS — E119 Type 2 diabetes mellitus without complications: Secondary | ICD-10-CM | POA: Diagnosis not present

## 2015-08-21 DIAGNOSIS — N2581 Secondary hyperparathyroidism of renal origin: Secondary | ICD-10-CM | POA: Diagnosis not present

## 2015-08-21 DIAGNOSIS — N186 End stage renal disease: Secondary | ICD-10-CM | POA: Diagnosis not present

## 2015-08-21 DIAGNOSIS — D509 Iron deficiency anemia, unspecified: Secondary | ICD-10-CM | POA: Diagnosis not present

## 2015-08-23 DIAGNOSIS — E119 Type 2 diabetes mellitus without complications: Secondary | ICD-10-CM | POA: Diagnosis not present

## 2015-08-23 DIAGNOSIS — N2581 Secondary hyperparathyroidism of renal origin: Secondary | ICD-10-CM | POA: Diagnosis not present

## 2015-08-23 DIAGNOSIS — N186 End stage renal disease: Secondary | ICD-10-CM | POA: Diagnosis not present

## 2015-08-23 DIAGNOSIS — D509 Iron deficiency anemia, unspecified: Secondary | ICD-10-CM | POA: Diagnosis not present

## 2015-08-24 DIAGNOSIS — I120 Hypertensive chronic kidney disease with stage 5 chronic kidney disease or end stage renal disease: Secondary | ICD-10-CM | POA: Diagnosis not present

## 2015-08-24 DIAGNOSIS — Z992 Dependence on renal dialysis: Secondary | ICD-10-CM | POA: Diagnosis not present

## 2015-08-24 DIAGNOSIS — N186 End stage renal disease: Secondary | ICD-10-CM | POA: Diagnosis not present

## 2015-08-25 DIAGNOSIS — N2581 Secondary hyperparathyroidism of renal origin: Secondary | ICD-10-CM | POA: Diagnosis not present

## 2015-08-25 DIAGNOSIS — E119 Type 2 diabetes mellitus without complications: Secondary | ICD-10-CM | POA: Diagnosis not present

## 2015-08-25 DIAGNOSIS — D509 Iron deficiency anemia, unspecified: Secondary | ICD-10-CM | POA: Diagnosis not present

## 2015-08-25 DIAGNOSIS — N186 End stage renal disease: Secondary | ICD-10-CM | POA: Diagnosis not present

## 2015-08-28 DIAGNOSIS — N186 End stage renal disease: Secondary | ICD-10-CM | POA: Diagnosis not present

## 2015-08-28 DIAGNOSIS — E119 Type 2 diabetes mellitus without complications: Secondary | ICD-10-CM | POA: Diagnosis not present

## 2015-08-28 DIAGNOSIS — N2581 Secondary hyperparathyroidism of renal origin: Secondary | ICD-10-CM | POA: Diagnosis not present

## 2015-08-28 DIAGNOSIS — D509 Iron deficiency anemia, unspecified: Secondary | ICD-10-CM | POA: Diagnosis not present

## 2015-08-30 DIAGNOSIS — N186 End stage renal disease: Secondary | ICD-10-CM | POA: Diagnosis not present

## 2015-08-30 DIAGNOSIS — E119 Type 2 diabetes mellitus without complications: Secondary | ICD-10-CM | POA: Diagnosis not present

## 2015-08-30 DIAGNOSIS — D509 Iron deficiency anemia, unspecified: Secondary | ICD-10-CM | POA: Diagnosis not present

## 2015-08-30 DIAGNOSIS — N2581 Secondary hyperparathyroidism of renal origin: Secondary | ICD-10-CM | POA: Diagnosis not present

## 2015-09-01 DIAGNOSIS — N186 End stage renal disease: Secondary | ICD-10-CM | POA: Diagnosis not present

## 2015-09-01 DIAGNOSIS — N2581 Secondary hyperparathyroidism of renal origin: Secondary | ICD-10-CM | POA: Diagnosis not present

## 2015-09-01 DIAGNOSIS — D509 Iron deficiency anemia, unspecified: Secondary | ICD-10-CM | POA: Diagnosis not present

## 2015-09-01 DIAGNOSIS — E119 Type 2 diabetes mellitus without complications: Secondary | ICD-10-CM | POA: Diagnosis not present

## 2015-09-04 DIAGNOSIS — N186 End stage renal disease: Secondary | ICD-10-CM | POA: Diagnosis not present

## 2015-09-04 DIAGNOSIS — E119 Type 2 diabetes mellitus without complications: Secondary | ICD-10-CM | POA: Diagnosis not present

## 2015-09-04 DIAGNOSIS — D509 Iron deficiency anemia, unspecified: Secondary | ICD-10-CM | POA: Diagnosis not present

## 2015-09-04 DIAGNOSIS — N2581 Secondary hyperparathyroidism of renal origin: Secondary | ICD-10-CM | POA: Diagnosis not present

## 2015-09-06 DIAGNOSIS — D509 Iron deficiency anemia, unspecified: Secondary | ICD-10-CM | POA: Diagnosis not present

## 2015-09-06 DIAGNOSIS — E119 Type 2 diabetes mellitus without complications: Secondary | ICD-10-CM | POA: Diagnosis not present

## 2015-09-06 DIAGNOSIS — N186 End stage renal disease: Secondary | ICD-10-CM | POA: Diagnosis not present

## 2015-09-06 DIAGNOSIS — E1129 Type 2 diabetes mellitus with other diabetic kidney complication: Secondary | ICD-10-CM | POA: Diagnosis not present

## 2015-09-06 DIAGNOSIS — N2581 Secondary hyperparathyroidism of renal origin: Secondary | ICD-10-CM | POA: Diagnosis not present

## 2015-09-08 DIAGNOSIS — N2581 Secondary hyperparathyroidism of renal origin: Secondary | ICD-10-CM | POA: Diagnosis not present

## 2015-09-08 DIAGNOSIS — D509 Iron deficiency anemia, unspecified: Secondary | ICD-10-CM | POA: Diagnosis not present

## 2015-09-08 DIAGNOSIS — N186 End stage renal disease: Secondary | ICD-10-CM | POA: Diagnosis not present

## 2015-09-08 DIAGNOSIS — E119 Type 2 diabetes mellitus without complications: Secondary | ICD-10-CM | POA: Diagnosis not present

## 2015-09-11 DIAGNOSIS — N186 End stage renal disease: Secondary | ICD-10-CM | POA: Diagnosis not present

## 2015-09-11 DIAGNOSIS — D509 Iron deficiency anemia, unspecified: Secondary | ICD-10-CM | POA: Diagnosis not present

## 2015-09-11 DIAGNOSIS — E119 Type 2 diabetes mellitus without complications: Secondary | ICD-10-CM | POA: Diagnosis not present

## 2015-09-11 DIAGNOSIS — N2581 Secondary hyperparathyroidism of renal origin: Secondary | ICD-10-CM | POA: Diagnosis not present

## 2015-09-13 DIAGNOSIS — N186 End stage renal disease: Secondary | ICD-10-CM | POA: Diagnosis not present

## 2015-09-13 DIAGNOSIS — N2581 Secondary hyperparathyroidism of renal origin: Secondary | ICD-10-CM | POA: Diagnosis not present

## 2015-09-13 DIAGNOSIS — E119 Type 2 diabetes mellitus without complications: Secondary | ICD-10-CM | POA: Diagnosis not present

## 2015-09-13 DIAGNOSIS — D509 Iron deficiency anemia, unspecified: Secondary | ICD-10-CM | POA: Diagnosis not present

## 2015-09-15 DIAGNOSIS — E119 Type 2 diabetes mellitus without complications: Secondary | ICD-10-CM | POA: Diagnosis not present

## 2015-09-15 DIAGNOSIS — N2581 Secondary hyperparathyroidism of renal origin: Secondary | ICD-10-CM | POA: Diagnosis not present

## 2015-09-15 DIAGNOSIS — D509 Iron deficiency anemia, unspecified: Secondary | ICD-10-CM | POA: Diagnosis not present

## 2015-09-15 DIAGNOSIS — N186 End stage renal disease: Secondary | ICD-10-CM | POA: Diagnosis not present

## 2015-09-18 DIAGNOSIS — N2581 Secondary hyperparathyroidism of renal origin: Secondary | ICD-10-CM | POA: Diagnosis not present

## 2015-09-18 DIAGNOSIS — E119 Type 2 diabetes mellitus without complications: Secondary | ICD-10-CM | POA: Diagnosis not present

## 2015-09-18 DIAGNOSIS — N186 End stage renal disease: Secondary | ICD-10-CM | POA: Diagnosis not present

## 2015-09-18 DIAGNOSIS — D509 Iron deficiency anemia, unspecified: Secondary | ICD-10-CM | POA: Diagnosis not present

## 2015-09-20 DIAGNOSIS — D509 Iron deficiency anemia, unspecified: Secondary | ICD-10-CM | POA: Diagnosis not present

## 2015-09-20 DIAGNOSIS — N186 End stage renal disease: Secondary | ICD-10-CM | POA: Diagnosis not present

## 2015-09-20 DIAGNOSIS — E119 Type 2 diabetes mellitus without complications: Secondary | ICD-10-CM | POA: Diagnosis not present

## 2015-09-20 DIAGNOSIS — N2581 Secondary hyperparathyroidism of renal origin: Secondary | ICD-10-CM | POA: Diagnosis not present

## 2015-09-22 DIAGNOSIS — D509 Iron deficiency anemia, unspecified: Secondary | ICD-10-CM | POA: Diagnosis not present

## 2015-09-22 DIAGNOSIS — E119 Type 2 diabetes mellitus without complications: Secondary | ICD-10-CM | POA: Diagnosis not present

## 2015-09-22 DIAGNOSIS — N2581 Secondary hyperparathyroidism of renal origin: Secondary | ICD-10-CM | POA: Diagnosis not present

## 2015-09-22 DIAGNOSIS — N186 End stage renal disease: Secondary | ICD-10-CM | POA: Diagnosis not present

## 2015-09-23 DIAGNOSIS — I120 Hypertensive chronic kidney disease with stage 5 chronic kidney disease or end stage renal disease: Secondary | ICD-10-CM | POA: Diagnosis not present

## 2015-09-23 DIAGNOSIS — N186 End stage renal disease: Secondary | ICD-10-CM | POA: Diagnosis not present

## 2015-09-23 DIAGNOSIS — Z992 Dependence on renal dialysis: Secondary | ICD-10-CM | POA: Diagnosis not present

## 2015-09-25 DIAGNOSIS — D509 Iron deficiency anemia, unspecified: Secondary | ICD-10-CM | POA: Diagnosis not present

## 2015-09-25 DIAGNOSIS — N186 End stage renal disease: Secondary | ICD-10-CM | POA: Diagnosis not present

## 2015-09-25 DIAGNOSIS — E119 Type 2 diabetes mellitus without complications: Secondary | ICD-10-CM | POA: Diagnosis not present

## 2015-09-25 DIAGNOSIS — D631 Anemia in chronic kidney disease: Secondary | ICD-10-CM | POA: Diagnosis not present

## 2015-09-25 DIAGNOSIS — N2581 Secondary hyperparathyroidism of renal origin: Secondary | ICD-10-CM | POA: Diagnosis not present

## 2015-09-27 DIAGNOSIS — E119 Type 2 diabetes mellitus without complications: Secondary | ICD-10-CM | POA: Diagnosis not present

## 2015-09-27 DIAGNOSIS — N186 End stage renal disease: Secondary | ICD-10-CM | POA: Diagnosis not present

## 2015-09-27 DIAGNOSIS — D509 Iron deficiency anemia, unspecified: Secondary | ICD-10-CM | POA: Diagnosis not present

## 2015-09-27 DIAGNOSIS — N2581 Secondary hyperparathyroidism of renal origin: Secondary | ICD-10-CM | POA: Diagnosis not present

## 2015-09-27 DIAGNOSIS — D631 Anemia in chronic kidney disease: Secondary | ICD-10-CM | POA: Diagnosis not present

## 2015-09-29 DIAGNOSIS — D509 Iron deficiency anemia, unspecified: Secondary | ICD-10-CM | POA: Diagnosis not present

## 2015-09-29 DIAGNOSIS — E119 Type 2 diabetes mellitus without complications: Secondary | ICD-10-CM | POA: Diagnosis not present

## 2015-09-29 DIAGNOSIS — N186 End stage renal disease: Secondary | ICD-10-CM | POA: Diagnosis not present

## 2015-09-29 DIAGNOSIS — N2581 Secondary hyperparathyroidism of renal origin: Secondary | ICD-10-CM | POA: Diagnosis not present

## 2015-09-29 DIAGNOSIS — D631 Anemia in chronic kidney disease: Secondary | ICD-10-CM | POA: Diagnosis not present

## 2015-10-02 DIAGNOSIS — N186 End stage renal disease: Secondary | ICD-10-CM | POA: Diagnosis not present

## 2015-10-02 DIAGNOSIS — E119 Type 2 diabetes mellitus without complications: Secondary | ICD-10-CM | POA: Diagnosis not present

## 2015-10-02 DIAGNOSIS — D509 Iron deficiency anemia, unspecified: Secondary | ICD-10-CM | POA: Diagnosis not present

## 2015-10-02 DIAGNOSIS — N2581 Secondary hyperparathyroidism of renal origin: Secondary | ICD-10-CM | POA: Diagnosis not present

## 2015-10-02 DIAGNOSIS — D631 Anemia in chronic kidney disease: Secondary | ICD-10-CM | POA: Diagnosis not present

## 2015-10-04 DIAGNOSIS — N186 End stage renal disease: Secondary | ICD-10-CM | POA: Diagnosis not present

## 2015-10-04 DIAGNOSIS — D509 Iron deficiency anemia, unspecified: Secondary | ICD-10-CM | POA: Diagnosis not present

## 2015-10-04 DIAGNOSIS — D631 Anemia in chronic kidney disease: Secondary | ICD-10-CM | POA: Diagnosis not present

## 2015-10-04 DIAGNOSIS — E119 Type 2 diabetes mellitus without complications: Secondary | ICD-10-CM | POA: Diagnosis not present

## 2015-10-04 DIAGNOSIS — N2581 Secondary hyperparathyroidism of renal origin: Secondary | ICD-10-CM | POA: Diagnosis not present

## 2015-10-06 DIAGNOSIS — N186 End stage renal disease: Secondary | ICD-10-CM | POA: Diagnosis not present

## 2015-10-06 DIAGNOSIS — D509 Iron deficiency anemia, unspecified: Secondary | ICD-10-CM | POA: Diagnosis not present

## 2015-10-06 DIAGNOSIS — E119 Type 2 diabetes mellitus without complications: Secondary | ICD-10-CM | POA: Diagnosis not present

## 2015-10-06 DIAGNOSIS — D631 Anemia in chronic kidney disease: Secondary | ICD-10-CM | POA: Diagnosis not present

## 2015-10-06 DIAGNOSIS — N2581 Secondary hyperparathyroidism of renal origin: Secondary | ICD-10-CM | POA: Diagnosis not present

## 2015-10-09 DIAGNOSIS — D509 Iron deficiency anemia, unspecified: Secondary | ICD-10-CM | POA: Diagnosis not present

## 2015-10-09 DIAGNOSIS — N186 End stage renal disease: Secondary | ICD-10-CM | POA: Diagnosis not present

## 2015-10-09 DIAGNOSIS — N2581 Secondary hyperparathyroidism of renal origin: Secondary | ICD-10-CM | POA: Diagnosis not present

## 2015-10-09 DIAGNOSIS — E119 Type 2 diabetes mellitus without complications: Secondary | ICD-10-CM | POA: Diagnosis not present

## 2015-10-09 DIAGNOSIS — D631 Anemia in chronic kidney disease: Secondary | ICD-10-CM | POA: Diagnosis not present

## 2015-10-11 DIAGNOSIS — D509 Iron deficiency anemia, unspecified: Secondary | ICD-10-CM | POA: Diagnosis not present

## 2015-10-11 DIAGNOSIS — D631 Anemia in chronic kidney disease: Secondary | ICD-10-CM | POA: Diagnosis not present

## 2015-10-11 DIAGNOSIS — N186 End stage renal disease: Secondary | ICD-10-CM | POA: Diagnosis not present

## 2015-10-11 DIAGNOSIS — E119 Type 2 diabetes mellitus without complications: Secondary | ICD-10-CM | POA: Diagnosis not present

## 2015-10-11 DIAGNOSIS — N2581 Secondary hyperparathyroidism of renal origin: Secondary | ICD-10-CM | POA: Diagnosis not present

## 2015-10-13 DIAGNOSIS — D509 Iron deficiency anemia, unspecified: Secondary | ICD-10-CM | POA: Diagnosis not present

## 2015-10-13 DIAGNOSIS — E119 Type 2 diabetes mellitus without complications: Secondary | ICD-10-CM | POA: Diagnosis not present

## 2015-10-13 DIAGNOSIS — N2581 Secondary hyperparathyroidism of renal origin: Secondary | ICD-10-CM | POA: Diagnosis not present

## 2015-10-13 DIAGNOSIS — D631 Anemia in chronic kidney disease: Secondary | ICD-10-CM | POA: Diagnosis not present

## 2015-10-13 DIAGNOSIS — N186 End stage renal disease: Secondary | ICD-10-CM | POA: Diagnosis not present

## 2015-10-16 DIAGNOSIS — N2581 Secondary hyperparathyroidism of renal origin: Secondary | ICD-10-CM | POA: Diagnosis not present

## 2015-10-16 DIAGNOSIS — D509 Iron deficiency anemia, unspecified: Secondary | ICD-10-CM | POA: Diagnosis not present

## 2015-10-16 DIAGNOSIS — N186 End stage renal disease: Secondary | ICD-10-CM | POA: Diagnosis not present

## 2015-10-16 DIAGNOSIS — E119 Type 2 diabetes mellitus without complications: Secondary | ICD-10-CM | POA: Diagnosis not present

## 2015-10-16 DIAGNOSIS — D631 Anemia in chronic kidney disease: Secondary | ICD-10-CM | POA: Diagnosis not present

## 2015-10-18 DIAGNOSIS — N186 End stage renal disease: Secondary | ICD-10-CM | POA: Diagnosis not present

## 2015-10-18 DIAGNOSIS — D631 Anemia in chronic kidney disease: Secondary | ICD-10-CM | POA: Diagnosis not present

## 2015-10-18 DIAGNOSIS — E119 Type 2 diabetes mellitus without complications: Secondary | ICD-10-CM | POA: Diagnosis not present

## 2015-10-18 DIAGNOSIS — D509 Iron deficiency anemia, unspecified: Secondary | ICD-10-CM | POA: Diagnosis not present

## 2015-10-18 DIAGNOSIS — N2581 Secondary hyperparathyroidism of renal origin: Secondary | ICD-10-CM | POA: Diagnosis not present

## 2015-10-20 DIAGNOSIS — D631 Anemia in chronic kidney disease: Secondary | ICD-10-CM | POA: Diagnosis not present

## 2015-10-20 DIAGNOSIS — E119 Type 2 diabetes mellitus without complications: Secondary | ICD-10-CM | POA: Diagnosis not present

## 2015-10-20 DIAGNOSIS — D509 Iron deficiency anemia, unspecified: Secondary | ICD-10-CM | POA: Diagnosis not present

## 2015-10-20 DIAGNOSIS — N186 End stage renal disease: Secondary | ICD-10-CM | POA: Diagnosis not present

## 2015-10-20 DIAGNOSIS — N2581 Secondary hyperparathyroidism of renal origin: Secondary | ICD-10-CM | POA: Diagnosis not present

## 2015-10-23 DIAGNOSIS — D631 Anemia in chronic kidney disease: Secondary | ICD-10-CM | POA: Diagnosis not present

## 2015-10-23 DIAGNOSIS — N186 End stage renal disease: Secondary | ICD-10-CM | POA: Diagnosis not present

## 2015-10-23 DIAGNOSIS — N2581 Secondary hyperparathyroidism of renal origin: Secondary | ICD-10-CM | POA: Diagnosis not present

## 2015-10-23 DIAGNOSIS — D509 Iron deficiency anemia, unspecified: Secondary | ICD-10-CM | POA: Diagnosis not present

## 2015-10-23 DIAGNOSIS — E119 Type 2 diabetes mellitus without complications: Secondary | ICD-10-CM | POA: Diagnosis not present

## 2015-10-24 DIAGNOSIS — Z992 Dependence on renal dialysis: Secondary | ICD-10-CM | POA: Diagnosis not present

## 2015-10-24 DIAGNOSIS — I120 Hypertensive chronic kidney disease with stage 5 chronic kidney disease or end stage renal disease: Secondary | ICD-10-CM | POA: Diagnosis not present

## 2015-10-24 DIAGNOSIS — N186 End stage renal disease: Secondary | ICD-10-CM | POA: Diagnosis not present

## 2015-10-25 DIAGNOSIS — N186 End stage renal disease: Secondary | ICD-10-CM | POA: Diagnosis not present

## 2015-10-25 DIAGNOSIS — E119 Type 2 diabetes mellitus without complications: Secondary | ICD-10-CM | POA: Diagnosis not present

## 2015-10-25 DIAGNOSIS — N2581 Secondary hyperparathyroidism of renal origin: Secondary | ICD-10-CM | POA: Diagnosis not present

## 2015-10-25 DIAGNOSIS — D509 Iron deficiency anemia, unspecified: Secondary | ICD-10-CM | POA: Diagnosis not present

## 2015-10-25 DIAGNOSIS — D631 Anemia in chronic kidney disease: Secondary | ICD-10-CM | POA: Diagnosis not present

## 2015-10-27 DIAGNOSIS — D631 Anemia in chronic kidney disease: Secondary | ICD-10-CM | POA: Diagnosis not present

## 2015-10-27 DIAGNOSIS — N2581 Secondary hyperparathyroidism of renal origin: Secondary | ICD-10-CM | POA: Diagnosis not present

## 2015-10-27 DIAGNOSIS — D509 Iron deficiency anemia, unspecified: Secondary | ICD-10-CM | POA: Diagnosis not present

## 2015-10-27 DIAGNOSIS — N186 End stage renal disease: Secondary | ICD-10-CM | POA: Diagnosis not present

## 2015-10-27 DIAGNOSIS — E119 Type 2 diabetes mellitus without complications: Secondary | ICD-10-CM | POA: Diagnosis not present

## 2015-10-30 DIAGNOSIS — N2581 Secondary hyperparathyroidism of renal origin: Secondary | ICD-10-CM | POA: Diagnosis not present

## 2015-10-30 DIAGNOSIS — E119 Type 2 diabetes mellitus without complications: Secondary | ICD-10-CM | POA: Diagnosis not present

## 2015-10-30 DIAGNOSIS — N186 End stage renal disease: Secondary | ICD-10-CM | POA: Diagnosis not present

## 2015-10-30 DIAGNOSIS — D509 Iron deficiency anemia, unspecified: Secondary | ICD-10-CM | POA: Diagnosis not present

## 2015-10-30 DIAGNOSIS — D631 Anemia in chronic kidney disease: Secondary | ICD-10-CM | POA: Diagnosis not present

## 2015-11-01 DIAGNOSIS — N2581 Secondary hyperparathyroidism of renal origin: Secondary | ICD-10-CM | POA: Diagnosis not present

## 2015-11-01 DIAGNOSIS — D509 Iron deficiency anemia, unspecified: Secondary | ICD-10-CM | POA: Diagnosis not present

## 2015-11-01 DIAGNOSIS — E119 Type 2 diabetes mellitus without complications: Secondary | ICD-10-CM | POA: Diagnosis not present

## 2015-11-01 DIAGNOSIS — N186 End stage renal disease: Secondary | ICD-10-CM | POA: Diagnosis not present

## 2015-11-01 DIAGNOSIS — D631 Anemia in chronic kidney disease: Secondary | ICD-10-CM | POA: Diagnosis not present

## 2015-11-03 DIAGNOSIS — N2581 Secondary hyperparathyroidism of renal origin: Secondary | ICD-10-CM | POA: Diagnosis not present

## 2015-11-03 DIAGNOSIS — E119 Type 2 diabetes mellitus without complications: Secondary | ICD-10-CM | POA: Diagnosis not present

## 2015-11-03 DIAGNOSIS — D631 Anemia in chronic kidney disease: Secondary | ICD-10-CM | POA: Diagnosis not present

## 2015-11-03 DIAGNOSIS — N186 End stage renal disease: Secondary | ICD-10-CM | POA: Diagnosis not present

## 2015-11-03 DIAGNOSIS — D509 Iron deficiency anemia, unspecified: Secondary | ICD-10-CM | POA: Diagnosis not present

## 2015-11-06 DIAGNOSIS — E119 Type 2 diabetes mellitus without complications: Secondary | ICD-10-CM | POA: Diagnosis not present

## 2015-11-06 DIAGNOSIS — D631 Anemia in chronic kidney disease: Secondary | ICD-10-CM | POA: Diagnosis not present

## 2015-11-06 DIAGNOSIS — N2581 Secondary hyperparathyroidism of renal origin: Secondary | ICD-10-CM | POA: Diagnosis not present

## 2015-11-06 DIAGNOSIS — N186 End stage renal disease: Secondary | ICD-10-CM | POA: Diagnosis not present

## 2015-11-06 DIAGNOSIS — D509 Iron deficiency anemia, unspecified: Secondary | ICD-10-CM | POA: Diagnosis not present

## 2015-11-08 DIAGNOSIS — D509 Iron deficiency anemia, unspecified: Secondary | ICD-10-CM | POA: Diagnosis not present

## 2015-11-08 DIAGNOSIS — N186 End stage renal disease: Secondary | ICD-10-CM | POA: Diagnosis not present

## 2015-11-08 DIAGNOSIS — D631 Anemia in chronic kidney disease: Secondary | ICD-10-CM | POA: Diagnosis not present

## 2015-11-08 DIAGNOSIS — E119 Type 2 diabetes mellitus without complications: Secondary | ICD-10-CM | POA: Diagnosis not present

## 2015-11-08 DIAGNOSIS — N2581 Secondary hyperparathyroidism of renal origin: Secondary | ICD-10-CM | POA: Diagnosis not present

## 2015-11-10 DIAGNOSIS — D509 Iron deficiency anemia, unspecified: Secondary | ICD-10-CM | POA: Diagnosis not present

## 2015-11-10 DIAGNOSIS — D631 Anemia in chronic kidney disease: Secondary | ICD-10-CM | POA: Diagnosis not present

## 2015-11-10 DIAGNOSIS — N186 End stage renal disease: Secondary | ICD-10-CM | POA: Diagnosis not present

## 2015-11-10 DIAGNOSIS — N2581 Secondary hyperparathyroidism of renal origin: Secondary | ICD-10-CM | POA: Diagnosis not present

## 2015-11-10 DIAGNOSIS — E119 Type 2 diabetes mellitus without complications: Secondary | ICD-10-CM | POA: Diagnosis not present

## 2015-11-13 DIAGNOSIS — D631 Anemia in chronic kidney disease: Secondary | ICD-10-CM | POA: Diagnosis not present

## 2015-11-13 DIAGNOSIS — N186 End stage renal disease: Secondary | ICD-10-CM | POA: Diagnosis not present

## 2015-11-13 DIAGNOSIS — N2581 Secondary hyperparathyroidism of renal origin: Secondary | ICD-10-CM | POA: Diagnosis not present

## 2015-11-13 DIAGNOSIS — D509 Iron deficiency anemia, unspecified: Secondary | ICD-10-CM | POA: Diagnosis not present

## 2015-11-13 DIAGNOSIS — E119 Type 2 diabetes mellitus without complications: Secondary | ICD-10-CM | POA: Diagnosis not present

## 2015-11-15 DIAGNOSIS — N186 End stage renal disease: Secondary | ICD-10-CM | POA: Diagnosis not present

## 2015-11-15 DIAGNOSIS — N2581 Secondary hyperparathyroidism of renal origin: Secondary | ICD-10-CM | POA: Diagnosis not present

## 2015-11-15 DIAGNOSIS — D509 Iron deficiency anemia, unspecified: Secondary | ICD-10-CM | POA: Diagnosis not present

## 2015-11-15 DIAGNOSIS — D631 Anemia in chronic kidney disease: Secondary | ICD-10-CM | POA: Diagnosis not present

## 2015-11-15 DIAGNOSIS — E119 Type 2 diabetes mellitus without complications: Secondary | ICD-10-CM | POA: Diagnosis not present

## 2015-11-17 DIAGNOSIS — D509 Iron deficiency anemia, unspecified: Secondary | ICD-10-CM | POA: Diagnosis not present

## 2015-11-17 DIAGNOSIS — N186 End stage renal disease: Secondary | ICD-10-CM | POA: Diagnosis not present

## 2015-11-17 DIAGNOSIS — E119 Type 2 diabetes mellitus without complications: Secondary | ICD-10-CM | POA: Diagnosis not present

## 2015-11-17 DIAGNOSIS — N2581 Secondary hyperparathyroidism of renal origin: Secondary | ICD-10-CM | POA: Diagnosis not present

## 2015-11-17 DIAGNOSIS — D631 Anemia in chronic kidney disease: Secondary | ICD-10-CM | POA: Diagnosis not present

## 2015-11-20 DIAGNOSIS — N186 End stage renal disease: Secondary | ICD-10-CM | POA: Diagnosis not present

## 2015-11-20 DIAGNOSIS — D509 Iron deficiency anemia, unspecified: Secondary | ICD-10-CM | POA: Diagnosis not present

## 2015-11-20 DIAGNOSIS — N2581 Secondary hyperparathyroidism of renal origin: Secondary | ICD-10-CM | POA: Diagnosis not present

## 2015-11-20 DIAGNOSIS — D631 Anemia in chronic kidney disease: Secondary | ICD-10-CM | POA: Diagnosis not present

## 2015-11-20 DIAGNOSIS — E119 Type 2 diabetes mellitus without complications: Secondary | ICD-10-CM | POA: Diagnosis not present

## 2015-11-22 DIAGNOSIS — D509 Iron deficiency anemia, unspecified: Secondary | ICD-10-CM | POA: Diagnosis not present

## 2015-11-22 DIAGNOSIS — N186 End stage renal disease: Secondary | ICD-10-CM | POA: Diagnosis not present

## 2015-11-22 DIAGNOSIS — N2581 Secondary hyperparathyroidism of renal origin: Secondary | ICD-10-CM | POA: Diagnosis not present

## 2015-11-22 DIAGNOSIS — E119 Type 2 diabetes mellitus without complications: Secondary | ICD-10-CM | POA: Diagnosis not present

## 2015-11-22 DIAGNOSIS — D631 Anemia in chronic kidney disease: Secondary | ICD-10-CM | POA: Diagnosis not present

## 2015-11-23 DIAGNOSIS — Z992 Dependence on renal dialysis: Secondary | ICD-10-CM | POA: Diagnosis not present

## 2015-11-23 DIAGNOSIS — I120 Hypertensive chronic kidney disease with stage 5 chronic kidney disease or end stage renal disease: Secondary | ICD-10-CM | POA: Diagnosis not present

## 2015-11-23 DIAGNOSIS — N186 End stage renal disease: Secondary | ICD-10-CM | POA: Diagnosis not present

## 2015-11-24 DIAGNOSIS — N2581 Secondary hyperparathyroidism of renal origin: Secondary | ICD-10-CM | POA: Diagnosis not present

## 2015-11-24 DIAGNOSIS — N186 End stage renal disease: Secondary | ICD-10-CM | POA: Diagnosis not present

## 2015-11-24 DIAGNOSIS — D509 Iron deficiency anemia, unspecified: Secondary | ICD-10-CM | POA: Diagnosis not present

## 2015-11-24 DIAGNOSIS — E119 Type 2 diabetes mellitus without complications: Secondary | ICD-10-CM | POA: Diagnosis not present

## 2015-11-27 DIAGNOSIS — N186 End stage renal disease: Secondary | ICD-10-CM | POA: Diagnosis not present

## 2015-11-27 DIAGNOSIS — N2581 Secondary hyperparathyroidism of renal origin: Secondary | ICD-10-CM | POA: Diagnosis not present

## 2015-11-27 DIAGNOSIS — D509 Iron deficiency anemia, unspecified: Secondary | ICD-10-CM | POA: Diagnosis not present

## 2015-11-27 DIAGNOSIS — E119 Type 2 diabetes mellitus without complications: Secondary | ICD-10-CM | POA: Diagnosis not present

## 2015-11-29 DIAGNOSIS — N2581 Secondary hyperparathyroidism of renal origin: Secondary | ICD-10-CM | POA: Diagnosis not present

## 2015-11-29 DIAGNOSIS — E119 Type 2 diabetes mellitus without complications: Secondary | ICD-10-CM | POA: Diagnosis not present

## 2015-11-29 DIAGNOSIS — N186 End stage renal disease: Secondary | ICD-10-CM | POA: Diagnosis not present

## 2015-11-29 DIAGNOSIS — D509 Iron deficiency anemia, unspecified: Secondary | ICD-10-CM | POA: Diagnosis not present

## 2015-12-01 DIAGNOSIS — D509 Iron deficiency anemia, unspecified: Secondary | ICD-10-CM | POA: Diagnosis not present

## 2015-12-01 DIAGNOSIS — E119 Type 2 diabetes mellitus without complications: Secondary | ICD-10-CM | POA: Diagnosis not present

## 2015-12-01 DIAGNOSIS — N186 End stage renal disease: Secondary | ICD-10-CM | POA: Diagnosis not present

## 2015-12-01 DIAGNOSIS — N2581 Secondary hyperparathyroidism of renal origin: Secondary | ICD-10-CM | POA: Diagnosis not present

## 2015-12-04 DIAGNOSIS — E119 Type 2 diabetes mellitus without complications: Secondary | ICD-10-CM | POA: Diagnosis not present

## 2015-12-04 DIAGNOSIS — D509 Iron deficiency anemia, unspecified: Secondary | ICD-10-CM | POA: Diagnosis not present

## 2015-12-04 DIAGNOSIS — N186 End stage renal disease: Secondary | ICD-10-CM | POA: Diagnosis not present

## 2015-12-04 DIAGNOSIS — N2581 Secondary hyperparathyroidism of renal origin: Secondary | ICD-10-CM | POA: Diagnosis not present

## 2015-12-06 DIAGNOSIS — E1129 Type 2 diabetes mellitus with other diabetic kidney complication: Secondary | ICD-10-CM | POA: Diagnosis not present

## 2015-12-06 DIAGNOSIS — D509 Iron deficiency anemia, unspecified: Secondary | ICD-10-CM | POA: Diagnosis not present

## 2015-12-06 DIAGNOSIS — N2581 Secondary hyperparathyroidism of renal origin: Secondary | ICD-10-CM | POA: Diagnosis not present

## 2015-12-06 DIAGNOSIS — N186 End stage renal disease: Secondary | ICD-10-CM | POA: Diagnosis not present

## 2015-12-06 DIAGNOSIS — E119 Type 2 diabetes mellitus without complications: Secondary | ICD-10-CM | POA: Diagnosis not present

## 2015-12-08 DIAGNOSIS — E119 Type 2 diabetes mellitus without complications: Secondary | ICD-10-CM | POA: Diagnosis not present

## 2015-12-08 DIAGNOSIS — D509 Iron deficiency anemia, unspecified: Secondary | ICD-10-CM | POA: Diagnosis not present

## 2015-12-08 DIAGNOSIS — N186 End stage renal disease: Secondary | ICD-10-CM | POA: Diagnosis not present

## 2015-12-08 DIAGNOSIS — N2581 Secondary hyperparathyroidism of renal origin: Secondary | ICD-10-CM | POA: Diagnosis not present

## 2015-12-11 DIAGNOSIS — E119 Type 2 diabetes mellitus without complications: Secondary | ICD-10-CM | POA: Diagnosis not present

## 2015-12-11 DIAGNOSIS — D509 Iron deficiency anemia, unspecified: Secondary | ICD-10-CM | POA: Diagnosis not present

## 2015-12-11 DIAGNOSIS — N186 End stage renal disease: Secondary | ICD-10-CM | POA: Diagnosis not present

## 2015-12-11 DIAGNOSIS — N2581 Secondary hyperparathyroidism of renal origin: Secondary | ICD-10-CM | POA: Diagnosis not present

## 2015-12-13 DIAGNOSIS — N186 End stage renal disease: Secondary | ICD-10-CM | POA: Diagnosis not present

## 2015-12-13 DIAGNOSIS — N2581 Secondary hyperparathyroidism of renal origin: Secondary | ICD-10-CM | POA: Diagnosis not present

## 2015-12-13 DIAGNOSIS — E119 Type 2 diabetes mellitus without complications: Secondary | ICD-10-CM | POA: Diagnosis not present

## 2015-12-13 DIAGNOSIS — D509 Iron deficiency anemia, unspecified: Secondary | ICD-10-CM | POA: Diagnosis not present

## 2015-12-15 DIAGNOSIS — E119 Type 2 diabetes mellitus without complications: Secondary | ICD-10-CM | POA: Diagnosis not present

## 2015-12-15 DIAGNOSIS — N2581 Secondary hyperparathyroidism of renal origin: Secondary | ICD-10-CM | POA: Diagnosis not present

## 2015-12-15 DIAGNOSIS — N186 End stage renal disease: Secondary | ICD-10-CM | POA: Diagnosis not present

## 2015-12-15 DIAGNOSIS — D509 Iron deficiency anemia, unspecified: Secondary | ICD-10-CM | POA: Diagnosis not present

## 2015-12-18 DIAGNOSIS — E119 Type 2 diabetes mellitus without complications: Secondary | ICD-10-CM | POA: Diagnosis not present

## 2015-12-18 DIAGNOSIS — N2581 Secondary hyperparathyroidism of renal origin: Secondary | ICD-10-CM | POA: Diagnosis not present

## 2015-12-18 DIAGNOSIS — N186 End stage renal disease: Secondary | ICD-10-CM | POA: Diagnosis not present

## 2015-12-18 DIAGNOSIS — D509 Iron deficiency anemia, unspecified: Secondary | ICD-10-CM | POA: Diagnosis not present

## 2015-12-20 DIAGNOSIS — N2581 Secondary hyperparathyroidism of renal origin: Secondary | ICD-10-CM | POA: Diagnosis not present

## 2015-12-20 DIAGNOSIS — D509 Iron deficiency anemia, unspecified: Secondary | ICD-10-CM | POA: Diagnosis not present

## 2015-12-20 DIAGNOSIS — E119 Type 2 diabetes mellitus without complications: Secondary | ICD-10-CM | POA: Diagnosis not present

## 2015-12-20 DIAGNOSIS — N186 End stage renal disease: Secondary | ICD-10-CM | POA: Diagnosis not present

## 2015-12-22 DIAGNOSIS — D509 Iron deficiency anemia, unspecified: Secondary | ICD-10-CM | POA: Diagnosis not present

## 2015-12-22 DIAGNOSIS — N2581 Secondary hyperparathyroidism of renal origin: Secondary | ICD-10-CM | POA: Diagnosis not present

## 2015-12-22 DIAGNOSIS — N186 End stage renal disease: Secondary | ICD-10-CM | POA: Diagnosis not present

## 2015-12-22 DIAGNOSIS — E119 Type 2 diabetes mellitus without complications: Secondary | ICD-10-CM | POA: Diagnosis not present

## 2015-12-24 DIAGNOSIS — N186 End stage renal disease: Secondary | ICD-10-CM | POA: Diagnosis not present

## 2015-12-24 DIAGNOSIS — Z992 Dependence on renal dialysis: Secondary | ICD-10-CM | POA: Diagnosis not present

## 2015-12-24 DIAGNOSIS — I120 Hypertensive chronic kidney disease with stage 5 chronic kidney disease or end stage renal disease: Secondary | ICD-10-CM | POA: Diagnosis not present

## 2015-12-25 DIAGNOSIS — D509 Iron deficiency anemia, unspecified: Secondary | ICD-10-CM | POA: Diagnosis not present

## 2015-12-25 DIAGNOSIS — N2581 Secondary hyperparathyroidism of renal origin: Secondary | ICD-10-CM | POA: Diagnosis not present

## 2015-12-25 DIAGNOSIS — E1129 Type 2 diabetes mellitus with other diabetic kidney complication: Secondary | ICD-10-CM | POA: Diagnosis not present

## 2015-12-25 DIAGNOSIS — D631 Anemia in chronic kidney disease: Secondary | ICD-10-CM | POA: Diagnosis not present

## 2015-12-25 DIAGNOSIS — E119 Type 2 diabetes mellitus without complications: Secondary | ICD-10-CM | POA: Diagnosis not present

## 2015-12-25 DIAGNOSIS — N186 End stage renal disease: Secondary | ICD-10-CM | POA: Diagnosis not present

## 2015-12-27 DIAGNOSIS — E1129 Type 2 diabetes mellitus with other diabetic kidney complication: Secondary | ICD-10-CM | POA: Diagnosis not present

## 2015-12-27 DIAGNOSIS — N2581 Secondary hyperparathyroidism of renal origin: Secondary | ICD-10-CM | POA: Diagnosis not present

## 2015-12-27 DIAGNOSIS — N186 End stage renal disease: Secondary | ICD-10-CM | POA: Diagnosis not present

## 2015-12-27 DIAGNOSIS — D509 Iron deficiency anemia, unspecified: Secondary | ICD-10-CM | POA: Diagnosis not present

## 2015-12-27 DIAGNOSIS — D631 Anemia in chronic kidney disease: Secondary | ICD-10-CM | POA: Diagnosis not present

## 2015-12-27 DIAGNOSIS — E119 Type 2 diabetes mellitus without complications: Secondary | ICD-10-CM | POA: Diagnosis not present

## 2015-12-29 DIAGNOSIS — N2581 Secondary hyperparathyroidism of renal origin: Secondary | ICD-10-CM | POA: Diagnosis not present

## 2015-12-29 DIAGNOSIS — E1129 Type 2 diabetes mellitus with other diabetic kidney complication: Secondary | ICD-10-CM | POA: Diagnosis not present

## 2015-12-29 DIAGNOSIS — E119 Type 2 diabetes mellitus without complications: Secondary | ICD-10-CM | POA: Diagnosis not present

## 2015-12-29 DIAGNOSIS — D509 Iron deficiency anemia, unspecified: Secondary | ICD-10-CM | POA: Diagnosis not present

## 2015-12-29 DIAGNOSIS — D631 Anemia in chronic kidney disease: Secondary | ICD-10-CM | POA: Diagnosis not present

## 2015-12-29 DIAGNOSIS — N186 End stage renal disease: Secondary | ICD-10-CM | POA: Diagnosis not present

## 2016-01-01 DIAGNOSIS — D631 Anemia in chronic kidney disease: Secondary | ICD-10-CM | POA: Diagnosis not present

## 2016-01-01 DIAGNOSIS — N186 End stage renal disease: Secondary | ICD-10-CM | POA: Diagnosis not present

## 2016-01-01 DIAGNOSIS — N2581 Secondary hyperparathyroidism of renal origin: Secondary | ICD-10-CM | POA: Diagnosis not present

## 2016-01-01 DIAGNOSIS — E119 Type 2 diabetes mellitus without complications: Secondary | ICD-10-CM | POA: Diagnosis not present

## 2016-01-01 DIAGNOSIS — D509 Iron deficiency anemia, unspecified: Secondary | ICD-10-CM | POA: Diagnosis not present

## 2016-01-01 DIAGNOSIS — E1129 Type 2 diabetes mellitus with other diabetic kidney complication: Secondary | ICD-10-CM | POA: Diagnosis not present

## 2016-01-03 DIAGNOSIS — D631 Anemia in chronic kidney disease: Secondary | ICD-10-CM | POA: Diagnosis not present

## 2016-01-03 DIAGNOSIS — E1129 Type 2 diabetes mellitus with other diabetic kidney complication: Secondary | ICD-10-CM | POA: Diagnosis not present

## 2016-01-03 DIAGNOSIS — N186 End stage renal disease: Secondary | ICD-10-CM | POA: Diagnosis not present

## 2016-01-03 DIAGNOSIS — D509 Iron deficiency anemia, unspecified: Secondary | ICD-10-CM | POA: Diagnosis not present

## 2016-01-03 DIAGNOSIS — E119 Type 2 diabetes mellitus without complications: Secondary | ICD-10-CM | POA: Diagnosis not present

## 2016-01-03 DIAGNOSIS — N2581 Secondary hyperparathyroidism of renal origin: Secondary | ICD-10-CM | POA: Diagnosis not present

## 2016-01-05 DIAGNOSIS — D631 Anemia in chronic kidney disease: Secondary | ICD-10-CM | POA: Diagnosis not present

## 2016-01-05 DIAGNOSIS — E119 Type 2 diabetes mellitus without complications: Secondary | ICD-10-CM | POA: Diagnosis not present

## 2016-01-05 DIAGNOSIS — D509 Iron deficiency anemia, unspecified: Secondary | ICD-10-CM | POA: Diagnosis not present

## 2016-01-05 DIAGNOSIS — E1129 Type 2 diabetes mellitus with other diabetic kidney complication: Secondary | ICD-10-CM | POA: Diagnosis not present

## 2016-01-05 DIAGNOSIS — N2581 Secondary hyperparathyroidism of renal origin: Secondary | ICD-10-CM | POA: Diagnosis not present

## 2016-01-05 DIAGNOSIS — N186 End stage renal disease: Secondary | ICD-10-CM | POA: Diagnosis not present

## 2016-01-07 DIAGNOSIS — N186 End stage renal disease: Secondary | ICD-10-CM | POA: Diagnosis not present

## 2016-01-07 DIAGNOSIS — T82858D Stenosis of vascular prosthetic devices, implants and grafts, subsequent encounter: Secondary | ICD-10-CM | POA: Diagnosis not present

## 2016-01-07 DIAGNOSIS — I871 Compression of vein: Secondary | ICD-10-CM | POA: Diagnosis not present

## 2016-01-07 DIAGNOSIS — Z992 Dependence on renal dialysis: Secondary | ICD-10-CM | POA: Diagnosis not present

## 2016-01-08 DIAGNOSIS — D631 Anemia in chronic kidney disease: Secondary | ICD-10-CM | POA: Diagnosis not present

## 2016-01-08 DIAGNOSIS — D509 Iron deficiency anemia, unspecified: Secondary | ICD-10-CM | POA: Diagnosis not present

## 2016-01-08 DIAGNOSIS — E119 Type 2 diabetes mellitus without complications: Secondary | ICD-10-CM | POA: Diagnosis not present

## 2016-01-08 DIAGNOSIS — N186 End stage renal disease: Secondary | ICD-10-CM | POA: Diagnosis not present

## 2016-01-08 DIAGNOSIS — N2581 Secondary hyperparathyroidism of renal origin: Secondary | ICD-10-CM | POA: Diagnosis not present

## 2016-01-08 DIAGNOSIS — E1129 Type 2 diabetes mellitus with other diabetic kidney complication: Secondary | ICD-10-CM | POA: Diagnosis not present

## 2016-01-10 DIAGNOSIS — D631 Anemia in chronic kidney disease: Secondary | ICD-10-CM | POA: Diagnosis not present

## 2016-01-10 DIAGNOSIS — E119 Type 2 diabetes mellitus without complications: Secondary | ICD-10-CM | POA: Diagnosis not present

## 2016-01-10 DIAGNOSIS — N2581 Secondary hyperparathyroidism of renal origin: Secondary | ICD-10-CM | POA: Diagnosis not present

## 2016-01-10 DIAGNOSIS — N186 End stage renal disease: Secondary | ICD-10-CM | POA: Diagnosis not present

## 2016-01-10 DIAGNOSIS — E1129 Type 2 diabetes mellitus with other diabetic kidney complication: Secondary | ICD-10-CM | POA: Diagnosis not present

## 2016-01-10 DIAGNOSIS — D509 Iron deficiency anemia, unspecified: Secondary | ICD-10-CM | POA: Diagnosis not present

## 2016-01-12 DIAGNOSIS — E1129 Type 2 diabetes mellitus with other diabetic kidney complication: Secondary | ICD-10-CM | POA: Diagnosis not present

## 2016-01-12 DIAGNOSIS — E119 Type 2 diabetes mellitus without complications: Secondary | ICD-10-CM | POA: Diagnosis not present

## 2016-01-12 DIAGNOSIS — N186 End stage renal disease: Secondary | ICD-10-CM | POA: Diagnosis not present

## 2016-01-12 DIAGNOSIS — D509 Iron deficiency anemia, unspecified: Secondary | ICD-10-CM | POA: Diagnosis not present

## 2016-01-12 DIAGNOSIS — D631 Anemia in chronic kidney disease: Secondary | ICD-10-CM | POA: Diagnosis not present

## 2016-01-12 DIAGNOSIS — N2581 Secondary hyperparathyroidism of renal origin: Secondary | ICD-10-CM | POA: Diagnosis not present

## 2016-01-15 DIAGNOSIS — D509 Iron deficiency anemia, unspecified: Secondary | ICD-10-CM | POA: Diagnosis not present

## 2016-01-15 DIAGNOSIS — D631 Anemia in chronic kidney disease: Secondary | ICD-10-CM | POA: Diagnosis not present

## 2016-01-15 DIAGNOSIS — N2581 Secondary hyperparathyroidism of renal origin: Secondary | ICD-10-CM | POA: Diagnosis not present

## 2016-01-15 DIAGNOSIS — E119 Type 2 diabetes mellitus without complications: Secondary | ICD-10-CM | POA: Diagnosis not present

## 2016-01-15 DIAGNOSIS — N186 End stage renal disease: Secondary | ICD-10-CM | POA: Diagnosis not present

## 2016-01-15 DIAGNOSIS — E1129 Type 2 diabetes mellitus with other diabetic kidney complication: Secondary | ICD-10-CM | POA: Diagnosis not present

## 2016-01-17 DIAGNOSIS — N186 End stage renal disease: Secondary | ICD-10-CM | POA: Diagnosis not present

## 2016-01-17 DIAGNOSIS — N2581 Secondary hyperparathyroidism of renal origin: Secondary | ICD-10-CM | POA: Diagnosis not present

## 2016-01-17 DIAGNOSIS — D631 Anemia in chronic kidney disease: Secondary | ICD-10-CM | POA: Diagnosis not present

## 2016-01-17 DIAGNOSIS — E1129 Type 2 diabetes mellitus with other diabetic kidney complication: Secondary | ICD-10-CM | POA: Diagnosis not present

## 2016-01-17 DIAGNOSIS — E119 Type 2 diabetes mellitus without complications: Secondary | ICD-10-CM | POA: Diagnosis not present

## 2016-01-17 DIAGNOSIS — D509 Iron deficiency anemia, unspecified: Secondary | ICD-10-CM | POA: Diagnosis not present

## 2016-01-19 DIAGNOSIS — D509 Iron deficiency anemia, unspecified: Secondary | ICD-10-CM | POA: Diagnosis not present

## 2016-01-19 DIAGNOSIS — D631 Anemia in chronic kidney disease: Secondary | ICD-10-CM | POA: Diagnosis not present

## 2016-01-19 DIAGNOSIS — N186 End stage renal disease: Secondary | ICD-10-CM | POA: Diagnosis not present

## 2016-01-19 DIAGNOSIS — N2581 Secondary hyperparathyroidism of renal origin: Secondary | ICD-10-CM | POA: Diagnosis not present

## 2016-01-19 DIAGNOSIS — E1129 Type 2 diabetes mellitus with other diabetic kidney complication: Secondary | ICD-10-CM | POA: Diagnosis not present

## 2016-01-19 DIAGNOSIS — E119 Type 2 diabetes mellitus without complications: Secondary | ICD-10-CM | POA: Diagnosis not present

## 2016-01-22 DIAGNOSIS — D631 Anemia in chronic kidney disease: Secondary | ICD-10-CM | POA: Diagnosis not present

## 2016-01-22 DIAGNOSIS — D509 Iron deficiency anemia, unspecified: Secondary | ICD-10-CM | POA: Diagnosis not present

## 2016-01-22 DIAGNOSIS — N2581 Secondary hyperparathyroidism of renal origin: Secondary | ICD-10-CM | POA: Diagnosis not present

## 2016-01-22 DIAGNOSIS — E1129 Type 2 diabetes mellitus with other diabetic kidney complication: Secondary | ICD-10-CM | POA: Diagnosis not present

## 2016-01-22 DIAGNOSIS — N186 End stage renal disease: Secondary | ICD-10-CM | POA: Diagnosis not present

## 2016-01-22 DIAGNOSIS — E119 Type 2 diabetes mellitus without complications: Secondary | ICD-10-CM | POA: Diagnosis not present

## 2016-01-24 DIAGNOSIS — N186 End stage renal disease: Secondary | ICD-10-CM | POA: Diagnosis not present

## 2016-01-24 DIAGNOSIS — Z992 Dependence on renal dialysis: Secondary | ICD-10-CM | POA: Diagnosis not present

## 2016-01-24 DIAGNOSIS — I120 Hypertensive chronic kidney disease with stage 5 chronic kidney disease or end stage renal disease: Secondary | ICD-10-CM | POA: Diagnosis not present

## 2016-01-24 DIAGNOSIS — E119 Type 2 diabetes mellitus without complications: Secondary | ICD-10-CM | POA: Diagnosis not present

## 2016-01-24 DIAGNOSIS — N2581 Secondary hyperparathyroidism of renal origin: Secondary | ICD-10-CM | POA: Diagnosis not present

## 2016-01-24 DIAGNOSIS — D631 Anemia in chronic kidney disease: Secondary | ICD-10-CM | POA: Diagnosis not present

## 2016-01-24 DIAGNOSIS — D509 Iron deficiency anemia, unspecified: Secondary | ICD-10-CM | POA: Diagnosis not present

## 2016-01-24 DIAGNOSIS — E1129 Type 2 diabetes mellitus with other diabetic kidney complication: Secondary | ICD-10-CM | POA: Diagnosis not present

## 2016-01-26 DIAGNOSIS — E119 Type 2 diabetes mellitus without complications: Secondary | ICD-10-CM | POA: Diagnosis not present

## 2016-01-26 DIAGNOSIS — N186 End stage renal disease: Secondary | ICD-10-CM | POA: Diagnosis not present

## 2016-01-26 DIAGNOSIS — Z23 Encounter for immunization: Secondary | ICD-10-CM | POA: Diagnosis not present

## 2016-01-26 DIAGNOSIS — N2581 Secondary hyperparathyroidism of renal origin: Secondary | ICD-10-CM | POA: Diagnosis not present

## 2016-01-26 DIAGNOSIS — D631 Anemia in chronic kidney disease: Secondary | ICD-10-CM | POA: Diagnosis not present

## 2016-01-26 DIAGNOSIS — D509 Iron deficiency anemia, unspecified: Secondary | ICD-10-CM | POA: Diagnosis not present

## 2016-01-29 DIAGNOSIS — E119 Type 2 diabetes mellitus without complications: Secondary | ICD-10-CM | POA: Diagnosis not present

## 2016-01-29 DIAGNOSIS — Z23 Encounter for immunization: Secondary | ICD-10-CM | POA: Diagnosis not present

## 2016-01-29 DIAGNOSIS — N2581 Secondary hyperparathyroidism of renal origin: Secondary | ICD-10-CM | POA: Diagnosis not present

## 2016-01-29 DIAGNOSIS — N186 End stage renal disease: Secondary | ICD-10-CM | POA: Diagnosis not present

## 2016-01-29 DIAGNOSIS — D509 Iron deficiency anemia, unspecified: Secondary | ICD-10-CM | POA: Diagnosis not present

## 2016-01-29 DIAGNOSIS — D631 Anemia in chronic kidney disease: Secondary | ICD-10-CM | POA: Diagnosis not present

## 2016-01-31 DIAGNOSIS — D631 Anemia in chronic kidney disease: Secondary | ICD-10-CM | POA: Diagnosis not present

## 2016-01-31 DIAGNOSIS — D509 Iron deficiency anemia, unspecified: Secondary | ICD-10-CM | POA: Diagnosis not present

## 2016-01-31 DIAGNOSIS — Z23 Encounter for immunization: Secondary | ICD-10-CM | POA: Diagnosis not present

## 2016-01-31 DIAGNOSIS — N186 End stage renal disease: Secondary | ICD-10-CM | POA: Diagnosis not present

## 2016-01-31 DIAGNOSIS — N2581 Secondary hyperparathyroidism of renal origin: Secondary | ICD-10-CM | POA: Diagnosis not present

## 2016-01-31 DIAGNOSIS — E119 Type 2 diabetes mellitus without complications: Secondary | ICD-10-CM | POA: Diagnosis not present

## 2016-02-02 DIAGNOSIS — Z23 Encounter for immunization: Secondary | ICD-10-CM | POA: Diagnosis not present

## 2016-02-02 DIAGNOSIS — D509 Iron deficiency anemia, unspecified: Secondary | ICD-10-CM | POA: Diagnosis not present

## 2016-02-02 DIAGNOSIS — D631 Anemia in chronic kidney disease: Secondary | ICD-10-CM | POA: Diagnosis not present

## 2016-02-02 DIAGNOSIS — N186 End stage renal disease: Secondary | ICD-10-CM | POA: Diagnosis not present

## 2016-02-02 DIAGNOSIS — E119 Type 2 diabetes mellitus without complications: Secondary | ICD-10-CM | POA: Diagnosis not present

## 2016-02-02 DIAGNOSIS — N2581 Secondary hyperparathyroidism of renal origin: Secondary | ICD-10-CM | POA: Diagnosis not present

## 2016-02-05 DIAGNOSIS — D631 Anemia in chronic kidney disease: Secondary | ICD-10-CM | POA: Diagnosis not present

## 2016-02-05 DIAGNOSIS — D509 Iron deficiency anemia, unspecified: Secondary | ICD-10-CM | POA: Diagnosis not present

## 2016-02-05 DIAGNOSIS — Z23 Encounter for immunization: Secondary | ICD-10-CM | POA: Diagnosis not present

## 2016-02-05 DIAGNOSIS — N2581 Secondary hyperparathyroidism of renal origin: Secondary | ICD-10-CM | POA: Diagnosis not present

## 2016-02-05 DIAGNOSIS — E119 Type 2 diabetes mellitus without complications: Secondary | ICD-10-CM | POA: Diagnosis not present

## 2016-02-05 DIAGNOSIS — N186 End stage renal disease: Secondary | ICD-10-CM | POA: Diagnosis not present

## 2016-02-09 DIAGNOSIS — D509 Iron deficiency anemia, unspecified: Secondary | ICD-10-CM | POA: Diagnosis not present

## 2016-02-09 DIAGNOSIS — Z23 Encounter for immunization: Secondary | ICD-10-CM | POA: Diagnosis not present

## 2016-02-09 DIAGNOSIS — D631 Anemia in chronic kidney disease: Secondary | ICD-10-CM | POA: Diagnosis not present

## 2016-02-09 DIAGNOSIS — E119 Type 2 diabetes mellitus without complications: Secondary | ICD-10-CM | POA: Diagnosis not present

## 2016-02-09 DIAGNOSIS — N2581 Secondary hyperparathyroidism of renal origin: Secondary | ICD-10-CM | POA: Diagnosis not present

## 2016-02-09 DIAGNOSIS — N186 End stage renal disease: Secondary | ICD-10-CM | POA: Diagnosis not present

## 2016-02-12 DIAGNOSIS — D509 Iron deficiency anemia, unspecified: Secondary | ICD-10-CM | POA: Diagnosis not present

## 2016-02-12 DIAGNOSIS — Z23 Encounter for immunization: Secondary | ICD-10-CM | POA: Diagnosis not present

## 2016-02-12 DIAGNOSIS — D631 Anemia in chronic kidney disease: Secondary | ICD-10-CM | POA: Diagnosis not present

## 2016-02-12 DIAGNOSIS — E119 Type 2 diabetes mellitus without complications: Secondary | ICD-10-CM | POA: Diagnosis not present

## 2016-02-12 DIAGNOSIS — N186 End stage renal disease: Secondary | ICD-10-CM | POA: Diagnosis not present

## 2016-02-12 DIAGNOSIS — N2581 Secondary hyperparathyroidism of renal origin: Secondary | ICD-10-CM | POA: Diagnosis not present

## 2016-02-14 DIAGNOSIS — N2581 Secondary hyperparathyroidism of renal origin: Secondary | ICD-10-CM | POA: Diagnosis not present

## 2016-02-14 DIAGNOSIS — D509 Iron deficiency anemia, unspecified: Secondary | ICD-10-CM | POA: Diagnosis not present

## 2016-02-14 DIAGNOSIS — Z23 Encounter for immunization: Secondary | ICD-10-CM | POA: Diagnosis not present

## 2016-02-14 DIAGNOSIS — N186 End stage renal disease: Secondary | ICD-10-CM | POA: Diagnosis not present

## 2016-02-14 DIAGNOSIS — D631 Anemia in chronic kidney disease: Secondary | ICD-10-CM | POA: Diagnosis not present

## 2016-02-14 DIAGNOSIS — E119 Type 2 diabetes mellitus without complications: Secondary | ICD-10-CM | POA: Diagnosis not present

## 2016-02-16 DIAGNOSIS — D631 Anemia in chronic kidney disease: Secondary | ICD-10-CM | POA: Diagnosis not present

## 2016-02-16 DIAGNOSIS — N186 End stage renal disease: Secondary | ICD-10-CM | POA: Diagnosis not present

## 2016-02-16 DIAGNOSIS — D509 Iron deficiency anemia, unspecified: Secondary | ICD-10-CM | POA: Diagnosis not present

## 2016-02-16 DIAGNOSIS — E119 Type 2 diabetes mellitus without complications: Secondary | ICD-10-CM | POA: Diagnosis not present

## 2016-02-16 DIAGNOSIS — Z23 Encounter for immunization: Secondary | ICD-10-CM | POA: Diagnosis not present

## 2016-02-16 DIAGNOSIS — N2581 Secondary hyperparathyroidism of renal origin: Secondary | ICD-10-CM | POA: Diagnosis not present

## 2016-02-19 DIAGNOSIS — N2581 Secondary hyperparathyroidism of renal origin: Secondary | ICD-10-CM | POA: Diagnosis not present

## 2016-02-19 DIAGNOSIS — D631 Anemia in chronic kidney disease: Secondary | ICD-10-CM | POA: Diagnosis not present

## 2016-02-19 DIAGNOSIS — N186 End stage renal disease: Secondary | ICD-10-CM | POA: Diagnosis not present

## 2016-02-19 DIAGNOSIS — E119 Type 2 diabetes mellitus without complications: Secondary | ICD-10-CM | POA: Diagnosis not present

## 2016-02-19 DIAGNOSIS — Z23 Encounter for immunization: Secondary | ICD-10-CM | POA: Diagnosis not present

## 2016-02-19 DIAGNOSIS — D509 Iron deficiency anemia, unspecified: Secondary | ICD-10-CM | POA: Diagnosis not present

## 2016-02-21 DIAGNOSIS — E119 Type 2 diabetes mellitus without complications: Secondary | ICD-10-CM | POA: Diagnosis not present

## 2016-02-21 DIAGNOSIS — N186 End stage renal disease: Secondary | ICD-10-CM | POA: Diagnosis not present

## 2016-02-21 DIAGNOSIS — D509 Iron deficiency anemia, unspecified: Secondary | ICD-10-CM | POA: Diagnosis not present

## 2016-02-21 DIAGNOSIS — Z23 Encounter for immunization: Secondary | ICD-10-CM | POA: Diagnosis not present

## 2016-02-21 DIAGNOSIS — D631 Anemia in chronic kidney disease: Secondary | ICD-10-CM | POA: Diagnosis not present

## 2016-02-21 DIAGNOSIS — N2581 Secondary hyperparathyroidism of renal origin: Secondary | ICD-10-CM | POA: Diagnosis not present

## 2016-02-23 DIAGNOSIS — Z23 Encounter for immunization: Secondary | ICD-10-CM | POA: Diagnosis not present

## 2016-02-23 DIAGNOSIS — Z992 Dependence on renal dialysis: Secondary | ICD-10-CM | POA: Diagnosis not present

## 2016-02-23 DIAGNOSIS — N2581 Secondary hyperparathyroidism of renal origin: Secondary | ICD-10-CM | POA: Diagnosis not present

## 2016-02-23 DIAGNOSIS — I120 Hypertensive chronic kidney disease with stage 5 chronic kidney disease or end stage renal disease: Secondary | ICD-10-CM | POA: Diagnosis not present

## 2016-02-23 DIAGNOSIS — D631 Anemia in chronic kidney disease: Secondary | ICD-10-CM | POA: Diagnosis not present

## 2016-02-23 DIAGNOSIS — E119 Type 2 diabetes mellitus without complications: Secondary | ICD-10-CM | POA: Diagnosis not present

## 2016-02-23 DIAGNOSIS — D509 Iron deficiency anemia, unspecified: Secondary | ICD-10-CM | POA: Diagnosis not present

## 2016-02-23 DIAGNOSIS — N186 End stage renal disease: Secondary | ICD-10-CM | POA: Diagnosis not present

## 2016-02-26 DIAGNOSIS — N2581 Secondary hyperparathyroidism of renal origin: Secondary | ICD-10-CM | POA: Diagnosis not present

## 2016-02-26 DIAGNOSIS — E119 Type 2 diabetes mellitus without complications: Secondary | ICD-10-CM | POA: Diagnosis not present

## 2016-02-26 DIAGNOSIS — N186 End stage renal disease: Secondary | ICD-10-CM | POA: Diagnosis not present

## 2016-02-26 DIAGNOSIS — D509 Iron deficiency anemia, unspecified: Secondary | ICD-10-CM | POA: Diagnosis not present

## 2016-02-26 DIAGNOSIS — D631 Anemia in chronic kidney disease: Secondary | ICD-10-CM | POA: Diagnosis not present

## 2016-02-28 DIAGNOSIS — D509 Iron deficiency anemia, unspecified: Secondary | ICD-10-CM | POA: Diagnosis not present

## 2016-02-28 DIAGNOSIS — E119 Type 2 diabetes mellitus without complications: Secondary | ICD-10-CM | POA: Diagnosis not present

## 2016-02-28 DIAGNOSIS — D631 Anemia in chronic kidney disease: Secondary | ICD-10-CM | POA: Diagnosis not present

## 2016-02-28 DIAGNOSIS — N186 End stage renal disease: Secondary | ICD-10-CM | POA: Diagnosis not present

## 2016-02-28 DIAGNOSIS — N2581 Secondary hyperparathyroidism of renal origin: Secondary | ICD-10-CM | POA: Diagnosis not present

## 2016-03-01 DIAGNOSIS — E119 Type 2 diabetes mellitus without complications: Secondary | ICD-10-CM | POA: Diagnosis not present

## 2016-03-01 DIAGNOSIS — N186 End stage renal disease: Secondary | ICD-10-CM | POA: Diagnosis not present

## 2016-03-01 DIAGNOSIS — D631 Anemia in chronic kidney disease: Secondary | ICD-10-CM | POA: Diagnosis not present

## 2016-03-01 DIAGNOSIS — D509 Iron deficiency anemia, unspecified: Secondary | ICD-10-CM | POA: Diagnosis not present

## 2016-03-01 DIAGNOSIS — N2581 Secondary hyperparathyroidism of renal origin: Secondary | ICD-10-CM | POA: Diagnosis not present

## 2016-03-04 DIAGNOSIS — N186 End stage renal disease: Secondary | ICD-10-CM | POA: Diagnosis not present

## 2016-03-04 DIAGNOSIS — E119 Type 2 diabetes mellitus without complications: Secondary | ICD-10-CM | POA: Diagnosis not present

## 2016-03-04 DIAGNOSIS — D509 Iron deficiency anemia, unspecified: Secondary | ICD-10-CM | POA: Diagnosis not present

## 2016-03-04 DIAGNOSIS — D631 Anemia in chronic kidney disease: Secondary | ICD-10-CM | POA: Diagnosis not present

## 2016-03-04 DIAGNOSIS — N2581 Secondary hyperparathyroidism of renal origin: Secondary | ICD-10-CM | POA: Diagnosis not present

## 2016-03-06 DIAGNOSIS — D631 Anemia in chronic kidney disease: Secondary | ICD-10-CM | POA: Diagnosis not present

## 2016-03-06 DIAGNOSIS — N2581 Secondary hyperparathyroidism of renal origin: Secondary | ICD-10-CM | POA: Diagnosis not present

## 2016-03-06 DIAGNOSIS — N186 End stage renal disease: Secondary | ICD-10-CM | POA: Diagnosis not present

## 2016-03-06 DIAGNOSIS — E1129 Type 2 diabetes mellitus with other diabetic kidney complication: Secondary | ICD-10-CM | POA: Diagnosis not present

## 2016-03-06 DIAGNOSIS — D509 Iron deficiency anemia, unspecified: Secondary | ICD-10-CM | POA: Diagnosis not present

## 2016-03-06 DIAGNOSIS — E119 Type 2 diabetes mellitus without complications: Secondary | ICD-10-CM | POA: Diagnosis not present

## 2016-03-08 DIAGNOSIS — N2581 Secondary hyperparathyroidism of renal origin: Secondary | ICD-10-CM | POA: Diagnosis not present

## 2016-03-08 DIAGNOSIS — D509 Iron deficiency anemia, unspecified: Secondary | ICD-10-CM | POA: Diagnosis not present

## 2016-03-08 DIAGNOSIS — E119 Type 2 diabetes mellitus without complications: Secondary | ICD-10-CM | POA: Diagnosis not present

## 2016-03-08 DIAGNOSIS — N186 End stage renal disease: Secondary | ICD-10-CM | POA: Diagnosis not present

## 2016-03-08 DIAGNOSIS — D631 Anemia in chronic kidney disease: Secondary | ICD-10-CM | POA: Diagnosis not present

## 2016-03-11 DIAGNOSIS — D631 Anemia in chronic kidney disease: Secondary | ICD-10-CM | POA: Diagnosis not present

## 2016-03-11 DIAGNOSIS — D509 Iron deficiency anemia, unspecified: Secondary | ICD-10-CM | POA: Diagnosis not present

## 2016-03-11 DIAGNOSIS — N2581 Secondary hyperparathyroidism of renal origin: Secondary | ICD-10-CM | POA: Diagnosis not present

## 2016-03-11 DIAGNOSIS — N186 End stage renal disease: Secondary | ICD-10-CM | POA: Diagnosis not present

## 2016-03-11 DIAGNOSIS — E119 Type 2 diabetes mellitus without complications: Secondary | ICD-10-CM | POA: Diagnosis not present

## 2016-03-15 DIAGNOSIS — N2581 Secondary hyperparathyroidism of renal origin: Secondary | ICD-10-CM | POA: Diagnosis not present

## 2016-03-15 DIAGNOSIS — D509 Iron deficiency anemia, unspecified: Secondary | ICD-10-CM | POA: Diagnosis not present

## 2016-03-15 DIAGNOSIS — D631 Anemia in chronic kidney disease: Secondary | ICD-10-CM | POA: Diagnosis not present

## 2016-03-15 DIAGNOSIS — E119 Type 2 diabetes mellitus without complications: Secondary | ICD-10-CM | POA: Diagnosis not present

## 2016-03-15 DIAGNOSIS — N186 End stage renal disease: Secondary | ICD-10-CM | POA: Diagnosis not present

## 2016-03-18 DIAGNOSIS — N2581 Secondary hyperparathyroidism of renal origin: Secondary | ICD-10-CM | POA: Diagnosis not present

## 2016-03-18 DIAGNOSIS — E119 Type 2 diabetes mellitus without complications: Secondary | ICD-10-CM | POA: Diagnosis not present

## 2016-03-18 DIAGNOSIS — D509 Iron deficiency anemia, unspecified: Secondary | ICD-10-CM | POA: Diagnosis not present

## 2016-03-18 DIAGNOSIS — D631 Anemia in chronic kidney disease: Secondary | ICD-10-CM | POA: Diagnosis not present

## 2016-03-18 DIAGNOSIS — N186 End stage renal disease: Secondary | ICD-10-CM | POA: Diagnosis not present

## 2016-03-20 DIAGNOSIS — D631 Anemia in chronic kidney disease: Secondary | ICD-10-CM | POA: Diagnosis not present

## 2016-03-20 DIAGNOSIS — N186 End stage renal disease: Secondary | ICD-10-CM | POA: Diagnosis not present

## 2016-03-20 DIAGNOSIS — N2581 Secondary hyperparathyroidism of renal origin: Secondary | ICD-10-CM | POA: Diagnosis not present

## 2016-03-20 DIAGNOSIS — E119 Type 2 diabetes mellitus without complications: Secondary | ICD-10-CM | POA: Diagnosis not present

## 2016-03-20 DIAGNOSIS — D509 Iron deficiency anemia, unspecified: Secondary | ICD-10-CM | POA: Diagnosis not present

## 2016-03-22 DIAGNOSIS — D509 Iron deficiency anemia, unspecified: Secondary | ICD-10-CM | POA: Diagnosis not present

## 2016-03-22 DIAGNOSIS — N186 End stage renal disease: Secondary | ICD-10-CM | POA: Diagnosis not present

## 2016-03-22 DIAGNOSIS — E119 Type 2 diabetes mellitus without complications: Secondary | ICD-10-CM | POA: Diagnosis not present

## 2016-03-22 DIAGNOSIS — N2581 Secondary hyperparathyroidism of renal origin: Secondary | ICD-10-CM | POA: Diagnosis not present

## 2016-03-22 DIAGNOSIS — D631 Anemia in chronic kidney disease: Secondary | ICD-10-CM | POA: Diagnosis not present

## 2016-03-25 DIAGNOSIS — N2581 Secondary hyperparathyroidism of renal origin: Secondary | ICD-10-CM | POA: Diagnosis not present

## 2016-03-25 DIAGNOSIS — E119 Type 2 diabetes mellitus without complications: Secondary | ICD-10-CM | POA: Diagnosis not present

## 2016-03-25 DIAGNOSIS — N186 End stage renal disease: Secondary | ICD-10-CM | POA: Diagnosis not present

## 2016-03-25 DIAGNOSIS — I120 Hypertensive chronic kidney disease with stage 5 chronic kidney disease or end stage renal disease: Secondary | ICD-10-CM | POA: Diagnosis not present

## 2016-03-25 DIAGNOSIS — D509 Iron deficiency anemia, unspecified: Secondary | ICD-10-CM | POA: Diagnosis not present

## 2016-03-25 DIAGNOSIS — D631 Anemia in chronic kidney disease: Secondary | ICD-10-CM | POA: Diagnosis not present

## 2016-03-25 DIAGNOSIS — Z992 Dependence on renal dialysis: Secondary | ICD-10-CM | POA: Diagnosis not present

## 2016-03-27 DIAGNOSIS — E119 Type 2 diabetes mellitus without complications: Secondary | ICD-10-CM | POA: Diagnosis not present

## 2016-03-27 DIAGNOSIS — N2581 Secondary hyperparathyroidism of renal origin: Secondary | ICD-10-CM | POA: Diagnosis not present

## 2016-03-27 DIAGNOSIS — D509 Iron deficiency anemia, unspecified: Secondary | ICD-10-CM | POA: Diagnosis not present

## 2016-03-27 DIAGNOSIS — N186 End stage renal disease: Secondary | ICD-10-CM | POA: Diagnosis not present

## 2016-03-29 DIAGNOSIS — E119 Type 2 diabetes mellitus without complications: Secondary | ICD-10-CM | POA: Diagnosis not present

## 2016-03-29 DIAGNOSIS — N2581 Secondary hyperparathyroidism of renal origin: Secondary | ICD-10-CM | POA: Diagnosis not present

## 2016-03-29 DIAGNOSIS — N186 End stage renal disease: Secondary | ICD-10-CM | POA: Diagnosis not present

## 2016-03-29 DIAGNOSIS — D509 Iron deficiency anemia, unspecified: Secondary | ICD-10-CM | POA: Diagnosis not present

## 2016-04-01 DIAGNOSIS — N2581 Secondary hyperparathyroidism of renal origin: Secondary | ICD-10-CM | POA: Diagnosis not present

## 2016-04-01 DIAGNOSIS — D509 Iron deficiency anemia, unspecified: Secondary | ICD-10-CM | POA: Diagnosis not present

## 2016-04-01 DIAGNOSIS — N186 End stage renal disease: Secondary | ICD-10-CM | POA: Diagnosis not present

## 2016-04-01 DIAGNOSIS — E119 Type 2 diabetes mellitus without complications: Secondary | ICD-10-CM | POA: Diagnosis not present

## 2016-04-03 DIAGNOSIS — N2581 Secondary hyperparathyroidism of renal origin: Secondary | ICD-10-CM | POA: Diagnosis not present

## 2016-04-03 DIAGNOSIS — N186 End stage renal disease: Secondary | ICD-10-CM | POA: Diagnosis not present

## 2016-04-03 DIAGNOSIS — E119 Type 2 diabetes mellitus without complications: Secondary | ICD-10-CM | POA: Diagnosis not present

## 2016-04-03 DIAGNOSIS — D509 Iron deficiency anemia, unspecified: Secondary | ICD-10-CM | POA: Diagnosis not present

## 2016-04-05 DIAGNOSIS — N2581 Secondary hyperparathyroidism of renal origin: Secondary | ICD-10-CM | POA: Diagnosis not present

## 2016-04-05 DIAGNOSIS — N186 End stage renal disease: Secondary | ICD-10-CM | POA: Diagnosis not present

## 2016-04-05 DIAGNOSIS — D509 Iron deficiency anemia, unspecified: Secondary | ICD-10-CM | POA: Diagnosis not present

## 2016-04-05 DIAGNOSIS — E119 Type 2 diabetes mellitus without complications: Secondary | ICD-10-CM | POA: Diagnosis not present

## 2016-04-08 DIAGNOSIS — N2581 Secondary hyperparathyroidism of renal origin: Secondary | ICD-10-CM | POA: Diagnosis not present

## 2016-04-08 DIAGNOSIS — N186 End stage renal disease: Secondary | ICD-10-CM | POA: Diagnosis not present

## 2016-04-08 DIAGNOSIS — E119 Type 2 diabetes mellitus without complications: Secondary | ICD-10-CM | POA: Diagnosis not present

## 2016-04-08 DIAGNOSIS — D509 Iron deficiency anemia, unspecified: Secondary | ICD-10-CM | POA: Diagnosis not present

## 2016-04-10 DIAGNOSIS — D509 Iron deficiency anemia, unspecified: Secondary | ICD-10-CM | POA: Diagnosis not present

## 2016-04-10 DIAGNOSIS — E119 Type 2 diabetes mellitus without complications: Secondary | ICD-10-CM | POA: Diagnosis not present

## 2016-04-10 DIAGNOSIS — N186 End stage renal disease: Secondary | ICD-10-CM | POA: Diagnosis not present

## 2016-04-10 DIAGNOSIS — N2581 Secondary hyperparathyroidism of renal origin: Secondary | ICD-10-CM | POA: Diagnosis not present

## 2016-04-12 DIAGNOSIS — E119 Type 2 diabetes mellitus without complications: Secondary | ICD-10-CM | POA: Diagnosis not present

## 2016-04-12 DIAGNOSIS — N186 End stage renal disease: Secondary | ICD-10-CM | POA: Diagnosis not present

## 2016-04-12 DIAGNOSIS — N2581 Secondary hyperparathyroidism of renal origin: Secondary | ICD-10-CM | POA: Diagnosis not present

## 2016-04-12 DIAGNOSIS — D509 Iron deficiency anemia, unspecified: Secondary | ICD-10-CM | POA: Diagnosis not present

## 2016-04-14 DIAGNOSIS — D509 Iron deficiency anemia, unspecified: Secondary | ICD-10-CM | POA: Diagnosis not present

## 2016-04-14 DIAGNOSIS — N186 End stage renal disease: Secondary | ICD-10-CM | POA: Diagnosis not present

## 2016-04-14 DIAGNOSIS — E119 Type 2 diabetes mellitus without complications: Secondary | ICD-10-CM | POA: Diagnosis not present

## 2016-04-14 DIAGNOSIS — N2581 Secondary hyperparathyroidism of renal origin: Secondary | ICD-10-CM | POA: Diagnosis not present

## 2016-04-16 DIAGNOSIS — D509 Iron deficiency anemia, unspecified: Secondary | ICD-10-CM | POA: Diagnosis not present

## 2016-04-16 DIAGNOSIS — E119 Type 2 diabetes mellitus without complications: Secondary | ICD-10-CM | POA: Diagnosis not present

## 2016-04-16 DIAGNOSIS — N2581 Secondary hyperparathyroidism of renal origin: Secondary | ICD-10-CM | POA: Diagnosis not present

## 2016-04-16 DIAGNOSIS — N186 End stage renal disease: Secondary | ICD-10-CM | POA: Diagnosis not present

## 2016-04-19 DIAGNOSIS — N186 End stage renal disease: Secondary | ICD-10-CM | POA: Diagnosis not present

## 2016-04-19 DIAGNOSIS — E119 Type 2 diabetes mellitus without complications: Secondary | ICD-10-CM | POA: Diagnosis not present

## 2016-04-19 DIAGNOSIS — N2581 Secondary hyperparathyroidism of renal origin: Secondary | ICD-10-CM | POA: Diagnosis not present

## 2016-04-19 DIAGNOSIS — D509 Iron deficiency anemia, unspecified: Secondary | ICD-10-CM | POA: Diagnosis not present

## 2016-04-22 DIAGNOSIS — N186 End stage renal disease: Secondary | ICD-10-CM | POA: Diagnosis not present

## 2016-04-22 DIAGNOSIS — E119 Type 2 diabetes mellitus without complications: Secondary | ICD-10-CM | POA: Diagnosis not present

## 2016-04-22 DIAGNOSIS — N2581 Secondary hyperparathyroidism of renal origin: Secondary | ICD-10-CM | POA: Diagnosis not present

## 2016-04-22 DIAGNOSIS — D509 Iron deficiency anemia, unspecified: Secondary | ICD-10-CM | POA: Diagnosis not present

## 2016-04-24 DIAGNOSIS — N186 End stage renal disease: Secondary | ICD-10-CM | POA: Diagnosis not present

## 2016-04-24 DIAGNOSIS — Z992 Dependence on renal dialysis: Secondary | ICD-10-CM | POA: Diagnosis not present

## 2016-04-24 DIAGNOSIS — N2581 Secondary hyperparathyroidism of renal origin: Secondary | ICD-10-CM | POA: Diagnosis not present

## 2016-04-24 DIAGNOSIS — E119 Type 2 diabetes mellitus without complications: Secondary | ICD-10-CM | POA: Diagnosis not present

## 2016-04-24 DIAGNOSIS — D509 Iron deficiency anemia, unspecified: Secondary | ICD-10-CM | POA: Diagnosis not present

## 2016-04-24 DIAGNOSIS — I120 Hypertensive chronic kidney disease with stage 5 chronic kidney disease or end stage renal disease: Secondary | ICD-10-CM | POA: Diagnosis not present

## 2016-04-26 DIAGNOSIS — E119 Type 2 diabetes mellitus without complications: Secondary | ICD-10-CM | POA: Diagnosis not present

## 2016-04-26 DIAGNOSIS — N186 End stage renal disease: Secondary | ICD-10-CM | POA: Diagnosis not present

## 2016-04-26 DIAGNOSIS — D509 Iron deficiency anemia, unspecified: Secondary | ICD-10-CM | POA: Diagnosis not present

## 2016-04-26 DIAGNOSIS — N2581 Secondary hyperparathyroidism of renal origin: Secondary | ICD-10-CM | POA: Diagnosis not present

## 2016-04-29 DIAGNOSIS — N186 End stage renal disease: Secondary | ICD-10-CM | POA: Diagnosis not present

## 2016-04-29 DIAGNOSIS — N2581 Secondary hyperparathyroidism of renal origin: Secondary | ICD-10-CM | POA: Diagnosis not present

## 2016-04-29 DIAGNOSIS — D509 Iron deficiency anemia, unspecified: Secondary | ICD-10-CM | POA: Diagnosis not present

## 2016-04-29 DIAGNOSIS — E119 Type 2 diabetes mellitus without complications: Secondary | ICD-10-CM | POA: Diagnosis not present

## 2016-05-01 DIAGNOSIS — N186 End stage renal disease: Secondary | ICD-10-CM | POA: Diagnosis not present

## 2016-05-01 DIAGNOSIS — E119 Type 2 diabetes mellitus without complications: Secondary | ICD-10-CM | POA: Diagnosis not present

## 2016-05-01 DIAGNOSIS — N2581 Secondary hyperparathyroidism of renal origin: Secondary | ICD-10-CM | POA: Diagnosis not present

## 2016-05-01 DIAGNOSIS — D509 Iron deficiency anemia, unspecified: Secondary | ICD-10-CM | POA: Diagnosis not present

## 2016-05-06 DIAGNOSIS — D509 Iron deficiency anemia, unspecified: Secondary | ICD-10-CM | POA: Diagnosis not present

## 2016-05-06 DIAGNOSIS — N186 End stage renal disease: Secondary | ICD-10-CM | POA: Diagnosis not present

## 2016-05-06 DIAGNOSIS — N2581 Secondary hyperparathyroidism of renal origin: Secondary | ICD-10-CM | POA: Diagnosis not present

## 2016-05-06 DIAGNOSIS — E119 Type 2 diabetes mellitus without complications: Secondary | ICD-10-CM | POA: Diagnosis not present

## 2016-05-08 DIAGNOSIS — E119 Type 2 diabetes mellitus without complications: Secondary | ICD-10-CM | POA: Diagnosis not present

## 2016-05-08 DIAGNOSIS — N186 End stage renal disease: Secondary | ICD-10-CM | POA: Diagnosis not present

## 2016-05-08 DIAGNOSIS — N2581 Secondary hyperparathyroidism of renal origin: Secondary | ICD-10-CM | POA: Diagnosis not present

## 2016-05-08 DIAGNOSIS — D509 Iron deficiency anemia, unspecified: Secondary | ICD-10-CM | POA: Diagnosis not present

## 2016-05-10 DIAGNOSIS — E119 Type 2 diabetes mellitus without complications: Secondary | ICD-10-CM | POA: Diagnosis not present

## 2016-05-10 DIAGNOSIS — N186 End stage renal disease: Secondary | ICD-10-CM | POA: Diagnosis not present

## 2016-05-10 DIAGNOSIS — D509 Iron deficiency anemia, unspecified: Secondary | ICD-10-CM | POA: Diagnosis not present

## 2016-05-10 DIAGNOSIS — N2581 Secondary hyperparathyroidism of renal origin: Secondary | ICD-10-CM | POA: Diagnosis not present

## 2016-05-13 DIAGNOSIS — N186 End stage renal disease: Secondary | ICD-10-CM | POA: Diagnosis not present

## 2016-05-13 DIAGNOSIS — E119 Type 2 diabetes mellitus without complications: Secondary | ICD-10-CM | POA: Diagnosis not present

## 2016-05-13 DIAGNOSIS — D509 Iron deficiency anemia, unspecified: Secondary | ICD-10-CM | POA: Diagnosis not present

## 2016-05-13 DIAGNOSIS — N2581 Secondary hyperparathyroidism of renal origin: Secondary | ICD-10-CM | POA: Diagnosis not present

## 2016-05-15 DIAGNOSIS — N186 End stage renal disease: Secondary | ICD-10-CM | POA: Diagnosis not present

## 2016-05-15 DIAGNOSIS — N2581 Secondary hyperparathyroidism of renal origin: Secondary | ICD-10-CM | POA: Diagnosis not present

## 2016-05-15 DIAGNOSIS — E119 Type 2 diabetes mellitus without complications: Secondary | ICD-10-CM | POA: Diagnosis not present

## 2016-05-15 DIAGNOSIS — D509 Iron deficiency anemia, unspecified: Secondary | ICD-10-CM | POA: Diagnosis not present

## 2016-05-17 DIAGNOSIS — N186 End stage renal disease: Secondary | ICD-10-CM | POA: Diagnosis not present

## 2016-05-17 DIAGNOSIS — N2581 Secondary hyperparathyroidism of renal origin: Secondary | ICD-10-CM | POA: Diagnosis not present

## 2016-05-17 DIAGNOSIS — E119 Type 2 diabetes mellitus without complications: Secondary | ICD-10-CM | POA: Diagnosis not present

## 2016-05-17 DIAGNOSIS — D509 Iron deficiency anemia, unspecified: Secondary | ICD-10-CM | POA: Diagnosis not present

## 2016-05-20 DIAGNOSIS — E119 Type 2 diabetes mellitus without complications: Secondary | ICD-10-CM | POA: Diagnosis not present

## 2016-05-20 DIAGNOSIS — N186 End stage renal disease: Secondary | ICD-10-CM | POA: Diagnosis not present

## 2016-05-20 DIAGNOSIS — D509 Iron deficiency anemia, unspecified: Secondary | ICD-10-CM | POA: Diagnosis not present

## 2016-05-20 DIAGNOSIS — N2581 Secondary hyperparathyroidism of renal origin: Secondary | ICD-10-CM | POA: Diagnosis not present

## 2016-05-22 DIAGNOSIS — N2581 Secondary hyperparathyroidism of renal origin: Secondary | ICD-10-CM | POA: Diagnosis not present

## 2016-05-22 DIAGNOSIS — D509 Iron deficiency anemia, unspecified: Secondary | ICD-10-CM | POA: Diagnosis not present

## 2016-05-22 DIAGNOSIS — N186 End stage renal disease: Secondary | ICD-10-CM | POA: Diagnosis not present

## 2016-05-22 DIAGNOSIS — E119 Type 2 diabetes mellitus without complications: Secondary | ICD-10-CM | POA: Diagnosis not present

## 2016-05-24 DIAGNOSIS — E119 Type 2 diabetes mellitus without complications: Secondary | ICD-10-CM | POA: Diagnosis not present

## 2016-05-24 DIAGNOSIS — N186 End stage renal disease: Secondary | ICD-10-CM | POA: Diagnosis not present

## 2016-05-24 DIAGNOSIS — D509 Iron deficiency anemia, unspecified: Secondary | ICD-10-CM | POA: Diagnosis not present

## 2016-05-24 DIAGNOSIS — N2581 Secondary hyperparathyroidism of renal origin: Secondary | ICD-10-CM | POA: Diagnosis not present

## 2016-05-25 DIAGNOSIS — N186 End stage renal disease: Secondary | ICD-10-CM | POA: Diagnosis not present

## 2016-05-25 DIAGNOSIS — I120 Hypertensive chronic kidney disease with stage 5 chronic kidney disease or end stage renal disease: Secondary | ICD-10-CM | POA: Diagnosis not present

## 2016-05-25 DIAGNOSIS — Z992 Dependence on renal dialysis: Secondary | ICD-10-CM | POA: Diagnosis not present

## 2016-05-27 DIAGNOSIS — E119 Type 2 diabetes mellitus without complications: Secondary | ICD-10-CM | POA: Diagnosis not present

## 2016-05-27 DIAGNOSIS — N186 End stage renal disease: Secondary | ICD-10-CM | POA: Diagnosis not present

## 2016-05-27 DIAGNOSIS — D509 Iron deficiency anemia, unspecified: Secondary | ICD-10-CM | POA: Diagnosis not present

## 2016-05-27 DIAGNOSIS — N2581 Secondary hyperparathyroidism of renal origin: Secondary | ICD-10-CM | POA: Diagnosis not present

## 2016-05-27 DIAGNOSIS — D72828 Other elevated white blood cell count: Secondary | ICD-10-CM | POA: Diagnosis not present

## 2016-05-29 DIAGNOSIS — N2581 Secondary hyperparathyroidism of renal origin: Secondary | ICD-10-CM | POA: Diagnosis not present

## 2016-05-29 DIAGNOSIS — N186 End stage renal disease: Secondary | ICD-10-CM | POA: Diagnosis not present

## 2016-05-29 DIAGNOSIS — D72828 Other elevated white blood cell count: Secondary | ICD-10-CM | POA: Diagnosis not present

## 2016-05-29 DIAGNOSIS — E119 Type 2 diabetes mellitus without complications: Secondary | ICD-10-CM | POA: Diagnosis not present

## 2016-05-29 DIAGNOSIS — D509 Iron deficiency anemia, unspecified: Secondary | ICD-10-CM | POA: Diagnosis not present

## 2016-05-31 DIAGNOSIS — D72828 Other elevated white blood cell count: Secondary | ICD-10-CM | POA: Diagnosis not present

## 2016-05-31 DIAGNOSIS — N186 End stage renal disease: Secondary | ICD-10-CM | POA: Diagnosis not present

## 2016-05-31 DIAGNOSIS — N2581 Secondary hyperparathyroidism of renal origin: Secondary | ICD-10-CM | POA: Diagnosis not present

## 2016-05-31 DIAGNOSIS — E119 Type 2 diabetes mellitus without complications: Secondary | ICD-10-CM | POA: Diagnosis not present

## 2016-05-31 DIAGNOSIS — D509 Iron deficiency anemia, unspecified: Secondary | ICD-10-CM | POA: Diagnosis not present

## 2016-06-03 DIAGNOSIS — N186 End stage renal disease: Secondary | ICD-10-CM | POA: Diagnosis not present

## 2016-06-03 DIAGNOSIS — D72828 Other elevated white blood cell count: Secondary | ICD-10-CM | POA: Diagnosis not present

## 2016-06-03 DIAGNOSIS — D509 Iron deficiency anemia, unspecified: Secondary | ICD-10-CM | POA: Diagnosis not present

## 2016-06-03 DIAGNOSIS — E119 Type 2 diabetes mellitus without complications: Secondary | ICD-10-CM | POA: Diagnosis not present

## 2016-06-03 DIAGNOSIS — N2581 Secondary hyperparathyroidism of renal origin: Secondary | ICD-10-CM | POA: Diagnosis not present

## 2016-06-05 DIAGNOSIS — E119 Type 2 diabetes mellitus without complications: Secondary | ICD-10-CM | POA: Diagnosis not present

## 2016-06-05 DIAGNOSIS — E1129 Type 2 diabetes mellitus with other diabetic kidney complication: Secondary | ICD-10-CM | POA: Diagnosis not present

## 2016-06-05 DIAGNOSIS — D509 Iron deficiency anemia, unspecified: Secondary | ICD-10-CM | POA: Diagnosis not present

## 2016-06-05 DIAGNOSIS — N186 End stage renal disease: Secondary | ICD-10-CM | POA: Diagnosis not present

## 2016-06-05 DIAGNOSIS — N2581 Secondary hyperparathyroidism of renal origin: Secondary | ICD-10-CM | POA: Diagnosis not present

## 2016-06-05 DIAGNOSIS — D72828 Other elevated white blood cell count: Secondary | ICD-10-CM | POA: Diagnosis not present

## 2016-06-07 DIAGNOSIS — E119 Type 2 diabetes mellitus without complications: Secondary | ICD-10-CM | POA: Diagnosis not present

## 2016-06-07 DIAGNOSIS — D509 Iron deficiency anemia, unspecified: Secondary | ICD-10-CM | POA: Diagnosis not present

## 2016-06-07 DIAGNOSIS — D72828 Other elevated white blood cell count: Secondary | ICD-10-CM | POA: Diagnosis not present

## 2016-06-07 DIAGNOSIS — N2581 Secondary hyperparathyroidism of renal origin: Secondary | ICD-10-CM | POA: Diagnosis not present

## 2016-06-07 DIAGNOSIS — N186 End stage renal disease: Secondary | ICD-10-CM | POA: Diagnosis not present

## 2016-06-10 DIAGNOSIS — N186 End stage renal disease: Secondary | ICD-10-CM | POA: Diagnosis not present

## 2016-06-10 DIAGNOSIS — N2581 Secondary hyperparathyroidism of renal origin: Secondary | ICD-10-CM | POA: Diagnosis not present

## 2016-06-10 DIAGNOSIS — D72828 Other elevated white blood cell count: Secondary | ICD-10-CM | POA: Diagnosis not present

## 2016-06-10 DIAGNOSIS — D509 Iron deficiency anemia, unspecified: Secondary | ICD-10-CM | POA: Diagnosis not present

## 2016-06-10 DIAGNOSIS — E119 Type 2 diabetes mellitus without complications: Secondary | ICD-10-CM | POA: Diagnosis not present

## 2016-06-14 DIAGNOSIS — N186 End stage renal disease: Secondary | ICD-10-CM | POA: Diagnosis not present

## 2016-06-14 DIAGNOSIS — E119 Type 2 diabetes mellitus without complications: Secondary | ICD-10-CM | POA: Diagnosis not present

## 2016-06-14 DIAGNOSIS — N2581 Secondary hyperparathyroidism of renal origin: Secondary | ICD-10-CM | POA: Diagnosis not present

## 2016-06-14 DIAGNOSIS — D72828 Other elevated white blood cell count: Secondary | ICD-10-CM | POA: Diagnosis not present

## 2016-06-14 DIAGNOSIS — D509 Iron deficiency anemia, unspecified: Secondary | ICD-10-CM | POA: Diagnosis not present

## 2016-06-17 DIAGNOSIS — D72828 Other elevated white blood cell count: Secondary | ICD-10-CM | POA: Diagnosis not present

## 2016-06-17 DIAGNOSIS — N2581 Secondary hyperparathyroidism of renal origin: Secondary | ICD-10-CM | POA: Diagnosis not present

## 2016-06-17 DIAGNOSIS — D509 Iron deficiency anemia, unspecified: Secondary | ICD-10-CM | POA: Diagnosis not present

## 2016-06-17 DIAGNOSIS — E119 Type 2 diabetes mellitus without complications: Secondary | ICD-10-CM | POA: Diagnosis not present

## 2016-06-17 DIAGNOSIS — N186 End stage renal disease: Secondary | ICD-10-CM | POA: Diagnosis not present

## 2016-06-19 DIAGNOSIS — D72828 Other elevated white blood cell count: Secondary | ICD-10-CM | POA: Diagnosis not present

## 2016-06-19 DIAGNOSIS — N2581 Secondary hyperparathyroidism of renal origin: Secondary | ICD-10-CM | POA: Diagnosis not present

## 2016-06-19 DIAGNOSIS — N186 End stage renal disease: Secondary | ICD-10-CM | POA: Diagnosis not present

## 2016-06-19 DIAGNOSIS — D509 Iron deficiency anemia, unspecified: Secondary | ICD-10-CM | POA: Diagnosis not present

## 2016-06-19 DIAGNOSIS — E119 Type 2 diabetes mellitus without complications: Secondary | ICD-10-CM | POA: Diagnosis not present

## 2016-06-21 DIAGNOSIS — D72828 Other elevated white blood cell count: Secondary | ICD-10-CM | POA: Diagnosis not present

## 2016-06-21 DIAGNOSIS — E119 Type 2 diabetes mellitus without complications: Secondary | ICD-10-CM | POA: Diagnosis not present

## 2016-06-21 DIAGNOSIS — D509 Iron deficiency anemia, unspecified: Secondary | ICD-10-CM | POA: Diagnosis not present

## 2016-06-21 DIAGNOSIS — N186 End stage renal disease: Secondary | ICD-10-CM | POA: Diagnosis not present

## 2016-06-21 DIAGNOSIS — N2581 Secondary hyperparathyroidism of renal origin: Secondary | ICD-10-CM | POA: Diagnosis not present

## 2016-06-24 DIAGNOSIS — N186 End stage renal disease: Secondary | ICD-10-CM | POA: Diagnosis not present

## 2016-06-24 DIAGNOSIS — N2581 Secondary hyperparathyroidism of renal origin: Secondary | ICD-10-CM | POA: Diagnosis not present

## 2016-06-24 DIAGNOSIS — D509 Iron deficiency anemia, unspecified: Secondary | ICD-10-CM | POA: Diagnosis not present

## 2016-06-24 DIAGNOSIS — D72828 Other elevated white blood cell count: Secondary | ICD-10-CM | POA: Diagnosis not present

## 2016-06-24 DIAGNOSIS — E119 Type 2 diabetes mellitus without complications: Secondary | ICD-10-CM | POA: Diagnosis not present

## 2016-06-25 DIAGNOSIS — I120 Hypertensive chronic kidney disease with stage 5 chronic kidney disease or end stage renal disease: Secondary | ICD-10-CM | POA: Diagnosis not present

## 2016-06-25 DIAGNOSIS — N186 End stage renal disease: Secondary | ICD-10-CM | POA: Diagnosis not present

## 2016-06-25 DIAGNOSIS — Z992 Dependence on renal dialysis: Secondary | ICD-10-CM | POA: Diagnosis not present

## 2016-06-26 DIAGNOSIS — D631 Anemia in chronic kidney disease: Secondary | ICD-10-CM | POA: Diagnosis not present

## 2016-06-26 DIAGNOSIS — N186 End stage renal disease: Secondary | ICD-10-CM | POA: Diagnosis not present

## 2016-06-26 DIAGNOSIS — E119 Type 2 diabetes mellitus without complications: Secondary | ICD-10-CM | POA: Diagnosis not present

## 2016-06-26 DIAGNOSIS — N2581 Secondary hyperparathyroidism of renal origin: Secondary | ICD-10-CM | POA: Diagnosis not present

## 2016-06-28 DIAGNOSIS — N2581 Secondary hyperparathyroidism of renal origin: Secondary | ICD-10-CM | POA: Diagnosis not present

## 2016-06-28 DIAGNOSIS — E119 Type 2 diabetes mellitus without complications: Secondary | ICD-10-CM | POA: Diagnosis not present

## 2016-06-28 DIAGNOSIS — D631 Anemia in chronic kidney disease: Secondary | ICD-10-CM | POA: Diagnosis not present

## 2016-06-28 DIAGNOSIS — N186 End stage renal disease: Secondary | ICD-10-CM | POA: Diagnosis not present

## 2016-07-01 DIAGNOSIS — E119 Type 2 diabetes mellitus without complications: Secondary | ICD-10-CM | POA: Diagnosis not present

## 2016-07-01 DIAGNOSIS — N2581 Secondary hyperparathyroidism of renal origin: Secondary | ICD-10-CM | POA: Diagnosis not present

## 2016-07-01 DIAGNOSIS — N186 End stage renal disease: Secondary | ICD-10-CM | POA: Diagnosis not present

## 2016-07-01 DIAGNOSIS — D631 Anemia in chronic kidney disease: Secondary | ICD-10-CM | POA: Diagnosis not present

## 2016-07-03 DIAGNOSIS — N2581 Secondary hyperparathyroidism of renal origin: Secondary | ICD-10-CM | POA: Diagnosis not present

## 2016-07-03 DIAGNOSIS — E119 Type 2 diabetes mellitus without complications: Secondary | ICD-10-CM | POA: Diagnosis not present

## 2016-07-03 DIAGNOSIS — D631 Anemia in chronic kidney disease: Secondary | ICD-10-CM | POA: Diagnosis not present

## 2016-07-03 DIAGNOSIS — N186 End stage renal disease: Secondary | ICD-10-CM | POA: Diagnosis not present

## 2016-07-05 DIAGNOSIS — N186 End stage renal disease: Secondary | ICD-10-CM | POA: Diagnosis not present

## 2016-07-05 DIAGNOSIS — N2581 Secondary hyperparathyroidism of renal origin: Secondary | ICD-10-CM | POA: Diagnosis not present

## 2016-07-05 DIAGNOSIS — D631 Anemia in chronic kidney disease: Secondary | ICD-10-CM | POA: Diagnosis not present

## 2016-07-05 DIAGNOSIS — E119 Type 2 diabetes mellitus without complications: Secondary | ICD-10-CM | POA: Diagnosis not present

## 2016-07-08 DIAGNOSIS — N2581 Secondary hyperparathyroidism of renal origin: Secondary | ICD-10-CM | POA: Diagnosis not present

## 2016-07-08 DIAGNOSIS — D631 Anemia in chronic kidney disease: Secondary | ICD-10-CM | POA: Diagnosis not present

## 2016-07-08 DIAGNOSIS — N186 End stage renal disease: Secondary | ICD-10-CM | POA: Diagnosis not present

## 2016-07-08 DIAGNOSIS — E119 Type 2 diabetes mellitus without complications: Secondary | ICD-10-CM | POA: Diagnosis not present

## 2016-07-10 DIAGNOSIS — N186 End stage renal disease: Secondary | ICD-10-CM | POA: Diagnosis not present

## 2016-07-10 DIAGNOSIS — D631 Anemia in chronic kidney disease: Secondary | ICD-10-CM | POA: Diagnosis not present

## 2016-07-10 DIAGNOSIS — E119 Type 2 diabetes mellitus without complications: Secondary | ICD-10-CM | POA: Diagnosis not present

## 2016-07-10 DIAGNOSIS — N2581 Secondary hyperparathyroidism of renal origin: Secondary | ICD-10-CM | POA: Diagnosis not present

## 2016-07-12 DIAGNOSIS — N2581 Secondary hyperparathyroidism of renal origin: Secondary | ICD-10-CM | POA: Diagnosis not present

## 2016-07-12 DIAGNOSIS — D631 Anemia in chronic kidney disease: Secondary | ICD-10-CM | POA: Diagnosis not present

## 2016-07-12 DIAGNOSIS — N186 End stage renal disease: Secondary | ICD-10-CM | POA: Diagnosis not present

## 2016-07-12 DIAGNOSIS — E119 Type 2 diabetes mellitus without complications: Secondary | ICD-10-CM | POA: Diagnosis not present

## 2016-07-15 DIAGNOSIS — N186 End stage renal disease: Secondary | ICD-10-CM | POA: Diagnosis not present

## 2016-07-15 DIAGNOSIS — D631 Anemia in chronic kidney disease: Secondary | ICD-10-CM | POA: Diagnosis not present

## 2016-07-15 DIAGNOSIS — N2581 Secondary hyperparathyroidism of renal origin: Secondary | ICD-10-CM | POA: Diagnosis not present

## 2016-07-15 DIAGNOSIS — E119 Type 2 diabetes mellitus without complications: Secondary | ICD-10-CM | POA: Diagnosis not present

## 2016-07-17 DIAGNOSIS — E119 Type 2 diabetes mellitus without complications: Secondary | ICD-10-CM | POA: Diagnosis not present

## 2016-07-17 DIAGNOSIS — D631 Anemia in chronic kidney disease: Secondary | ICD-10-CM | POA: Diagnosis not present

## 2016-07-17 DIAGNOSIS — N2581 Secondary hyperparathyroidism of renal origin: Secondary | ICD-10-CM | POA: Diagnosis not present

## 2016-07-17 DIAGNOSIS — N186 End stage renal disease: Secondary | ICD-10-CM | POA: Diagnosis not present

## 2016-07-19 DIAGNOSIS — N186 End stage renal disease: Secondary | ICD-10-CM | POA: Diagnosis not present

## 2016-07-19 DIAGNOSIS — E119 Type 2 diabetes mellitus without complications: Secondary | ICD-10-CM | POA: Diagnosis not present

## 2016-07-19 DIAGNOSIS — N2581 Secondary hyperparathyroidism of renal origin: Secondary | ICD-10-CM | POA: Diagnosis not present

## 2016-07-19 DIAGNOSIS — D631 Anemia in chronic kidney disease: Secondary | ICD-10-CM | POA: Diagnosis not present

## 2016-07-22 DIAGNOSIS — N186 End stage renal disease: Secondary | ICD-10-CM | POA: Diagnosis not present

## 2016-07-22 DIAGNOSIS — N2581 Secondary hyperparathyroidism of renal origin: Secondary | ICD-10-CM | POA: Diagnosis not present

## 2016-07-22 DIAGNOSIS — E119 Type 2 diabetes mellitus without complications: Secondary | ICD-10-CM | POA: Diagnosis not present

## 2016-07-22 DIAGNOSIS — D631 Anemia in chronic kidney disease: Secondary | ICD-10-CM | POA: Diagnosis not present

## 2016-07-23 DIAGNOSIS — N186 End stage renal disease: Secondary | ICD-10-CM | POA: Diagnosis not present

## 2016-07-23 DIAGNOSIS — Z992 Dependence on renal dialysis: Secondary | ICD-10-CM | POA: Diagnosis not present

## 2016-07-23 DIAGNOSIS — I120 Hypertensive chronic kidney disease with stage 5 chronic kidney disease or end stage renal disease: Secondary | ICD-10-CM | POA: Diagnosis not present

## 2016-07-24 DIAGNOSIS — E119 Type 2 diabetes mellitus without complications: Secondary | ICD-10-CM | POA: Diagnosis not present

## 2016-07-24 DIAGNOSIS — D631 Anemia in chronic kidney disease: Secondary | ICD-10-CM | POA: Diagnosis not present

## 2016-07-24 DIAGNOSIS — N186 End stage renal disease: Secondary | ICD-10-CM | POA: Diagnosis not present

## 2016-07-24 DIAGNOSIS — N2581 Secondary hyperparathyroidism of renal origin: Secondary | ICD-10-CM | POA: Diagnosis not present

## 2016-07-24 DIAGNOSIS — D509 Iron deficiency anemia, unspecified: Secondary | ICD-10-CM | POA: Diagnosis not present

## 2016-07-29 DIAGNOSIS — D631 Anemia in chronic kidney disease: Secondary | ICD-10-CM | POA: Diagnosis not present

## 2016-07-29 DIAGNOSIS — E119 Type 2 diabetes mellitus without complications: Secondary | ICD-10-CM | POA: Diagnosis not present

## 2016-07-29 DIAGNOSIS — N2581 Secondary hyperparathyroidism of renal origin: Secondary | ICD-10-CM | POA: Diagnosis not present

## 2016-07-29 DIAGNOSIS — D509 Iron deficiency anemia, unspecified: Secondary | ICD-10-CM | POA: Diagnosis not present

## 2016-07-29 DIAGNOSIS — N186 End stage renal disease: Secondary | ICD-10-CM | POA: Diagnosis not present

## 2016-07-31 DIAGNOSIS — D509 Iron deficiency anemia, unspecified: Secondary | ICD-10-CM | POA: Diagnosis not present

## 2016-07-31 DIAGNOSIS — D631 Anemia in chronic kidney disease: Secondary | ICD-10-CM | POA: Diagnosis not present

## 2016-07-31 DIAGNOSIS — N2581 Secondary hyperparathyroidism of renal origin: Secondary | ICD-10-CM | POA: Diagnosis not present

## 2016-07-31 DIAGNOSIS — E119 Type 2 diabetes mellitus without complications: Secondary | ICD-10-CM | POA: Diagnosis not present

## 2016-07-31 DIAGNOSIS — N186 End stage renal disease: Secondary | ICD-10-CM | POA: Diagnosis not present

## 2016-08-02 DIAGNOSIS — N186 End stage renal disease: Secondary | ICD-10-CM | POA: Diagnosis not present

## 2016-08-02 DIAGNOSIS — N2581 Secondary hyperparathyroidism of renal origin: Secondary | ICD-10-CM | POA: Diagnosis not present

## 2016-08-02 DIAGNOSIS — D509 Iron deficiency anemia, unspecified: Secondary | ICD-10-CM | POA: Diagnosis not present

## 2016-08-02 DIAGNOSIS — D631 Anemia in chronic kidney disease: Secondary | ICD-10-CM | POA: Diagnosis not present

## 2016-08-02 DIAGNOSIS — E119 Type 2 diabetes mellitus without complications: Secondary | ICD-10-CM | POA: Diagnosis not present

## 2016-08-05 DIAGNOSIS — D509 Iron deficiency anemia, unspecified: Secondary | ICD-10-CM | POA: Diagnosis not present

## 2016-08-05 DIAGNOSIS — D631 Anemia in chronic kidney disease: Secondary | ICD-10-CM | POA: Diagnosis not present

## 2016-08-05 DIAGNOSIS — N186 End stage renal disease: Secondary | ICD-10-CM | POA: Diagnosis not present

## 2016-08-05 DIAGNOSIS — N2581 Secondary hyperparathyroidism of renal origin: Secondary | ICD-10-CM | POA: Diagnosis not present

## 2016-08-05 DIAGNOSIS — E119 Type 2 diabetes mellitus without complications: Secondary | ICD-10-CM | POA: Diagnosis not present

## 2016-08-07 DIAGNOSIS — D631 Anemia in chronic kidney disease: Secondary | ICD-10-CM | POA: Diagnosis not present

## 2016-08-07 DIAGNOSIS — E119 Type 2 diabetes mellitus without complications: Secondary | ICD-10-CM | POA: Diagnosis not present

## 2016-08-07 DIAGNOSIS — D509 Iron deficiency anemia, unspecified: Secondary | ICD-10-CM | POA: Diagnosis not present

## 2016-08-07 DIAGNOSIS — N186 End stage renal disease: Secondary | ICD-10-CM | POA: Diagnosis not present

## 2016-08-07 DIAGNOSIS — N2581 Secondary hyperparathyroidism of renal origin: Secondary | ICD-10-CM | POA: Diagnosis not present

## 2016-08-09 DIAGNOSIS — D509 Iron deficiency anemia, unspecified: Secondary | ICD-10-CM | POA: Diagnosis not present

## 2016-08-09 DIAGNOSIS — D631 Anemia in chronic kidney disease: Secondary | ICD-10-CM | POA: Diagnosis not present

## 2016-08-09 DIAGNOSIS — N2581 Secondary hyperparathyroidism of renal origin: Secondary | ICD-10-CM | POA: Diagnosis not present

## 2016-08-09 DIAGNOSIS — E119 Type 2 diabetes mellitus without complications: Secondary | ICD-10-CM | POA: Diagnosis not present

## 2016-08-09 DIAGNOSIS — N186 End stage renal disease: Secondary | ICD-10-CM | POA: Diagnosis not present

## 2016-08-12 DIAGNOSIS — N186 End stage renal disease: Secondary | ICD-10-CM | POA: Diagnosis not present

## 2016-08-12 DIAGNOSIS — D509 Iron deficiency anemia, unspecified: Secondary | ICD-10-CM | POA: Diagnosis not present

## 2016-08-12 DIAGNOSIS — D631 Anemia in chronic kidney disease: Secondary | ICD-10-CM | POA: Diagnosis not present

## 2016-08-12 DIAGNOSIS — N2581 Secondary hyperparathyroidism of renal origin: Secondary | ICD-10-CM | POA: Diagnosis not present

## 2016-08-12 DIAGNOSIS — E119 Type 2 diabetes mellitus without complications: Secondary | ICD-10-CM | POA: Diagnosis not present

## 2016-08-14 DIAGNOSIS — D631 Anemia in chronic kidney disease: Secondary | ICD-10-CM | POA: Diagnosis not present

## 2016-08-14 DIAGNOSIS — N186 End stage renal disease: Secondary | ICD-10-CM | POA: Diagnosis not present

## 2016-08-14 DIAGNOSIS — E119 Type 2 diabetes mellitus without complications: Secondary | ICD-10-CM | POA: Diagnosis not present

## 2016-08-14 DIAGNOSIS — N2581 Secondary hyperparathyroidism of renal origin: Secondary | ICD-10-CM | POA: Diagnosis not present

## 2016-08-14 DIAGNOSIS — D509 Iron deficiency anemia, unspecified: Secondary | ICD-10-CM | POA: Diagnosis not present

## 2016-08-16 DIAGNOSIS — N2581 Secondary hyperparathyroidism of renal origin: Secondary | ICD-10-CM | POA: Diagnosis not present

## 2016-08-16 DIAGNOSIS — D509 Iron deficiency anemia, unspecified: Secondary | ICD-10-CM | POA: Diagnosis not present

## 2016-08-16 DIAGNOSIS — E119 Type 2 diabetes mellitus without complications: Secondary | ICD-10-CM | POA: Diagnosis not present

## 2016-08-16 DIAGNOSIS — N186 End stage renal disease: Secondary | ICD-10-CM | POA: Diagnosis not present

## 2016-08-16 DIAGNOSIS — D631 Anemia in chronic kidney disease: Secondary | ICD-10-CM | POA: Diagnosis not present

## 2016-08-19 DIAGNOSIS — E119 Type 2 diabetes mellitus without complications: Secondary | ICD-10-CM | POA: Diagnosis not present

## 2016-08-19 DIAGNOSIS — D509 Iron deficiency anemia, unspecified: Secondary | ICD-10-CM | POA: Diagnosis not present

## 2016-08-19 DIAGNOSIS — N2581 Secondary hyperparathyroidism of renal origin: Secondary | ICD-10-CM | POA: Diagnosis not present

## 2016-08-19 DIAGNOSIS — N186 End stage renal disease: Secondary | ICD-10-CM | POA: Diagnosis not present

## 2016-08-19 DIAGNOSIS — D631 Anemia in chronic kidney disease: Secondary | ICD-10-CM | POA: Diagnosis not present

## 2016-08-21 DIAGNOSIS — D631 Anemia in chronic kidney disease: Secondary | ICD-10-CM | POA: Diagnosis not present

## 2016-08-21 DIAGNOSIS — D509 Iron deficiency anemia, unspecified: Secondary | ICD-10-CM | POA: Diagnosis not present

## 2016-08-21 DIAGNOSIS — N186 End stage renal disease: Secondary | ICD-10-CM | POA: Diagnosis not present

## 2016-08-21 DIAGNOSIS — N2581 Secondary hyperparathyroidism of renal origin: Secondary | ICD-10-CM | POA: Diagnosis not present

## 2016-08-21 DIAGNOSIS — E119 Type 2 diabetes mellitus without complications: Secondary | ICD-10-CM | POA: Diagnosis not present

## 2016-08-23 DIAGNOSIS — N186 End stage renal disease: Secondary | ICD-10-CM | POA: Diagnosis not present

## 2016-08-23 DIAGNOSIS — E119 Type 2 diabetes mellitus without complications: Secondary | ICD-10-CM | POA: Diagnosis not present

## 2016-08-23 DIAGNOSIS — I120 Hypertensive chronic kidney disease with stage 5 chronic kidney disease or end stage renal disease: Secondary | ICD-10-CM | POA: Diagnosis not present

## 2016-08-23 DIAGNOSIS — Z992 Dependence on renal dialysis: Secondary | ICD-10-CM | POA: Diagnosis not present

## 2016-08-23 DIAGNOSIS — D631 Anemia in chronic kidney disease: Secondary | ICD-10-CM | POA: Diagnosis not present

## 2016-08-23 DIAGNOSIS — D509 Iron deficiency anemia, unspecified: Secondary | ICD-10-CM | POA: Diagnosis not present

## 2016-08-23 DIAGNOSIS — N2581 Secondary hyperparathyroidism of renal origin: Secondary | ICD-10-CM | POA: Diagnosis not present

## 2016-08-26 DIAGNOSIS — N2581 Secondary hyperparathyroidism of renal origin: Secondary | ICD-10-CM | POA: Diagnosis not present

## 2016-08-26 DIAGNOSIS — N186 End stage renal disease: Secondary | ICD-10-CM | POA: Diagnosis not present

## 2016-08-26 DIAGNOSIS — E119 Type 2 diabetes mellitus without complications: Secondary | ICD-10-CM | POA: Diagnosis not present

## 2016-08-26 DIAGNOSIS — D509 Iron deficiency anemia, unspecified: Secondary | ICD-10-CM | POA: Diagnosis not present

## 2016-08-28 DIAGNOSIS — N186 End stage renal disease: Secondary | ICD-10-CM | POA: Diagnosis not present

## 2016-08-28 DIAGNOSIS — E119 Type 2 diabetes mellitus without complications: Secondary | ICD-10-CM | POA: Diagnosis not present

## 2016-08-28 DIAGNOSIS — N2581 Secondary hyperparathyroidism of renal origin: Secondary | ICD-10-CM | POA: Diagnosis not present

## 2016-08-28 DIAGNOSIS — D509 Iron deficiency anemia, unspecified: Secondary | ICD-10-CM | POA: Diagnosis not present

## 2016-08-30 DIAGNOSIS — N2581 Secondary hyperparathyroidism of renal origin: Secondary | ICD-10-CM | POA: Diagnosis not present

## 2016-08-30 DIAGNOSIS — D509 Iron deficiency anemia, unspecified: Secondary | ICD-10-CM | POA: Diagnosis not present

## 2016-08-30 DIAGNOSIS — E119 Type 2 diabetes mellitus without complications: Secondary | ICD-10-CM | POA: Diagnosis not present

## 2016-08-30 DIAGNOSIS — N186 End stage renal disease: Secondary | ICD-10-CM | POA: Diagnosis not present

## 2016-09-02 DIAGNOSIS — D509 Iron deficiency anemia, unspecified: Secondary | ICD-10-CM | POA: Diagnosis not present

## 2016-09-02 DIAGNOSIS — E119 Type 2 diabetes mellitus without complications: Secondary | ICD-10-CM | POA: Diagnosis not present

## 2016-09-02 DIAGNOSIS — N2581 Secondary hyperparathyroidism of renal origin: Secondary | ICD-10-CM | POA: Diagnosis not present

## 2016-09-02 DIAGNOSIS — N186 End stage renal disease: Secondary | ICD-10-CM | POA: Diagnosis not present

## 2016-09-04 DIAGNOSIS — N186 End stage renal disease: Secondary | ICD-10-CM | POA: Diagnosis not present

## 2016-09-04 DIAGNOSIS — E119 Type 2 diabetes mellitus without complications: Secondary | ICD-10-CM | POA: Diagnosis not present

## 2016-09-04 DIAGNOSIS — E1129 Type 2 diabetes mellitus with other diabetic kidney complication: Secondary | ICD-10-CM | POA: Diagnosis not present

## 2016-09-04 DIAGNOSIS — N2581 Secondary hyperparathyroidism of renal origin: Secondary | ICD-10-CM | POA: Diagnosis not present

## 2016-09-04 DIAGNOSIS — D509 Iron deficiency anemia, unspecified: Secondary | ICD-10-CM | POA: Diagnosis not present

## 2016-09-06 DIAGNOSIS — N186 End stage renal disease: Secondary | ICD-10-CM | POA: Diagnosis not present

## 2016-09-06 DIAGNOSIS — E119 Type 2 diabetes mellitus without complications: Secondary | ICD-10-CM | POA: Diagnosis not present

## 2016-09-06 DIAGNOSIS — D509 Iron deficiency anemia, unspecified: Secondary | ICD-10-CM | POA: Diagnosis not present

## 2016-09-06 DIAGNOSIS — N2581 Secondary hyperparathyroidism of renal origin: Secondary | ICD-10-CM | POA: Diagnosis not present

## 2016-09-09 DIAGNOSIS — N2581 Secondary hyperparathyroidism of renal origin: Secondary | ICD-10-CM | POA: Diagnosis not present

## 2016-09-09 DIAGNOSIS — E119 Type 2 diabetes mellitus without complications: Secondary | ICD-10-CM | POA: Diagnosis not present

## 2016-09-09 DIAGNOSIS — D509 Iron deficiency anemia, unspecified: Secondary | ICD-10-CM | POA: Diagnosis not present

## 2016-09-09 DIAGNOSIS — N186 End stage renal disease: Secondary | ICD-10-CM | POA: Diagnosis not present

## 2016-09-11 DIAGNOSIS — N186 End stage renal disease: Secondary | ICD-10-CM | POA: Diagnosis not present

## 2016-09-11 DIAGNOSIS — D509 Iron deficiency anemia, unspecified: Secondary | ICD-10-CM | POA: Diagnosis not present

## 2016-09-11 DIAGNOSIS — N2581 Secondary hyperparathyroidism of renal origin: Secondary | ICD-10-CM | POA: Diagnosis not present

## 2016-09-11 DIAGNOSIS — E119 Type 2 diabetes mellitus without complications: Secondary | ICD-10-CM | POA: Diagnosis not present

## 2016-09-13 DIAGNOSIS — E119 Type 2 diabetes mellitus without complications: Secondary | ICD-10-CM | POA: Diagnosis not present

## 2016-09-13 DIAGNOSIS — D509 Iron deficiency anemia, unspecified: Secondary | ICD-10-CM | POA: Diagnosis not present

## 2016-09-13 DIAGNOSIS — N186 End stage renal disease: Secondary | ICD-10-CM | POA: Diagnosis not present

## 2016-09-13 DIAGNOSIS — N2581 Secondary hyperparathyroidism of renal origin: Secondary | ICD-10-CM | POA: Diagnosis not present

## 2016-09-16 DIAGNOSIS — D509 Iron deficiency anemia, unspecified: Secondary | ICD-10-CM | POA: Diagnosis not present

## 2016-09-16 DIAGNOSIS — N186 End stage renal disease: Secondary | ICD-10-CM | POA: Diagnosis not present

## 2016-09-16 DIAGNOSIS — N2581 Secondary hyperparathyroidism of renal origin: Secondary | ICD-10-CM | POA: Diagnosis not present

## 2016-09-16 DIAGNOSIS — E119 Type 2 diabetes mellitus without complications: Secondary | ICD-10-CM | POA: Diagnosis not present

## 2016-09-18 DIAGNOSIS — D509 Iron deficiency anemia, unspecified: Secondary | ICD-10-CM | POA: Diagnosis not present

## 2016-09-18 DIAGNOSIS — N186 End stage renal disease: Secondary | ICD-10-CM | POA: Diagnosis not present

## 2016-09-18 DIAGNOSIS — N2581 Secondary hyperparathyroidism of renal origin: Secondary | ICD-10-CM | POA: Diagnosis not present

## 2016-09-18 DIAGNOSIS — E119 Type 2 diabetes mellitus without complications: Secondary | ICD-10-CM | POA: Diagnosis not present

## 2016-09-22 DIAGNOSIS — I120 Hypertensive chronic kidney disease with stage 5 chronic kidney disease or end stage renal disease: Secondary | ICD-10-CM | POA: Diagnosis not present

## 2016-09-22 DIAGNOSIS — N186 End stage renal disease: Secondary | ICD-10-CM | POA: Diagnosis not present

## 2016-09-22 DIAGNOSIS — Z992 Dependence on renal dialysis: Secondary | ICD-10-CM | POA: Diagnosis not present

## 2016-09-23 DIAGNOSIS — E119 Type 2 diabetes mellitus without complications: Secondary | ICD-10-CM | POA: Diagnosis not present

## 2016-09-23 DIAGNOSIS — N2581 Secondary hyperparathyroidism of renal origin: Secondary | ICD-10-CM | POA: Diagnosis not present

## 2016-09-23 DIAGNOSIS — D509 Iron deficiency anemia, unspecified: Secondary | ICD-10-CM | POA: Diagnosis not present

## 2016-09-23 DIAGNOSIS — N186 End stage renal disease: Secondary | ICD-10-CM | POA: Diagnosis not present

## 2016-09-25 DIAGNOSIS — E119 Type 2 diabetes mellitus without complications: Secondary | ICD-10-CM | POA: Diagnosis not present

## 2016-09-25 DIAGNOSIS — N186 End stage renal disease: Secondary | ICD-10-CM | POA: Diagnosis not present

## 2016-09-25 DIAGNOSIS — D509 Iron deficiency anemia, unspecified: Secondary | ICD-10-CM | POA: Diagnosis not present

## 2016-09-25 DIAGNOSIS — N2581 Secondary hyperparathyroidism of renal origin: Secondary | ICD-10-CM | POA: Diagnosis not present

## 2016-09-27 DIAGNOSIS — D509 Iron deficiency anemia, unspecified: Secondary | ICD-10-CM | POA: Diagnosis not present

## 2016-09-27 DIAGNOSIS — N186 End stage renal disease: Secondary | ICD-10-CM | POA: Diagnosis not present

## 2016-09-27 DIAGNOSIS — N2581 Secondary hyperparathyroidism of renal origin: Secondary | ICD-10-CM | POA: Diagnosis not present

## 2016-09-27 DIAGNOSIS — E119 Type 2 diabetes mellitus without complications: Secondary | ICD-10-CM | POA: Diagnosis not present

## 2016-09-30 DIAGNOSIS — D509 Iron deficiency anemia, unspecified: Secondary | ICD-10-CM | POA: Diagnosis not present

## 2016-09-30 DIAGNOSIS — N2581 Secondary hyperparathyroidism of renal origin: Secondary | ICD-10-CM | POA: Diagnosis not present

## 2016-09-30 DIAGNOSIS — E119 Type 2 diabetes mellitus without complications: Secondary | ICD-10-CM | POA: Diagnosis not present

## 2016-09-30 DIAGNOSIS — N186 End stage renal disease: Secondary | ICD-10-CM | POA: Diagnosis not present

## 2016-10-02 DIAGNOSIS — N2581 Secondary hyperparathyroidism of renal origin: Secondary | ICD-10-CM | POA: Diagnosis not present

## 2016-10-02 DIAGNOSIS — N186 End stage renal disease: Secondary | ICD-10-CM | POA: Diagnosis not present

## 2016-10-02 DIAGNOSIS — E119 Type 2 diabetes mellitus without complications: Secondary | ICD-10-CM | POA: Diagnosis not present

## 2016-10-02 DIAGNOSIS — D509 Iron deficiency anemia, unspecified: Secondary | ICD-10-CM | POA: Diagnosis not present

## 2016-10-04 DIAGNOSIS — N186 End stage renal disease: Secondary | ICD-10-CM | POA: Diagnosis not present

## 2016-10-04 DIAGNOSIS — N2581 Secondary hyperparathyroidism of renal origin: Secondary | ICD-10-CM | POA: Diagnosis not present

## 2016-10-04 DIAGNOSIS — D509 Iron deficiency anemia, unspecified: Secondary | ICD-10-CM | POA: Diagnosis not present

## 2016-10-04 DIAGNOSIS — E119 Type 2 diabetes mellitus without complications: Secondary | ICD-10-CM | POA: Diagnosis not present

## 2016-10-07 DIAGNOSIS — N186 End stage renal disease: Secondary | ICD-10-CM | POA: Diagnosis not present

## 2016-10-07 DIAGNOSIS — N2581 Secondary hyperparathyroidism of renal origin: Secondary | ICD-10-CM | POA: Diagnosis not present

## 2016-10-07 DIAGNOSIS — E119 Type 2 diabetes mellitus without complications: Secondary | ICD-10-CM | POA: Diagnosis not present

## 2016-10-07 DIAGNOSIS — D509 Iron deficiency anemia, unspecified: Secondary | ICD-10-CM | POA: Diagnosis not present

## 2016-10-09 DIAGNOSIS — N2581 Secondary hyperparathyroidism of renal origin: Secondary | ICD-10-CM | POA: Diagnosis not present

## 2016-10-09 DIAGNOSIS — D509 Iron deficiency anemia, unspecified: Secondary | ICD-10-CM | POA: Diagnosis not present

## 2016-10-09 DIAGNOSIS — N186 End stage renal disease: Secondary | ICD-10-CM | POA: Diagnosis not present

## 2016-10-09 DIAGNOSIS — E119 Type 2 diabetes mellitus without complications: Secondary | ICD-10-CM | POA: Diagnosis not present

## 2016-10-11 DIAGNOSIS — N2581 Secondary hyperparathyroidism of renal origin: Secondary | ICD-10-CM | POA: Diagnosis not present

## 2016-10-11 DIAGNOSIS — E119 Type 2 diabetes mellitus without complications: Secondary | ICD-10-CM | POA: Diagnosis not present

## 2016-10-11 DIAGNOSIS — D509 Iron deficiency anemia, unspecified: Secondary | ICD-10-CM | POA: Diagnosis not present

## 2016-10-11 DIAGNOSIS — N186 End stage renal disease: Secondary | ICD-10-CM | POA: Diagnosis not present

## 2016-10-14 DIAGNOSIS — N2581 Secondary hyperparathyroidism of renal origin: Secondary | ICD-10-CM | POA: Diagnosis not present

## 2016-10-14 DIAGNOSIS — D509 Iron deficiency anemia, unspecified: Secondary | ICD-10-CM | POA: Diagnosis not present

## 2016-10-14 DIAGNOSIS — E119 Type 2 diabetes mellitus without complications: Secondary | ICD-10-CM | POA: Diagnosis not present

## 2016-10-14 DIAGNOSIS — N186 End stage renal disease: Secondary | ICD-10-CM | POA: Diagnosis not present

## 2016-10-16 DIAGNOSIS — D509 Iron deficiency anemia, unspecified: Secondary | ICD-10-CM | POA: Diagnosis not present

## 2016-10-16 DIAGNOSIS — N2581 Secondary hyperparathyroidism of renal origin: Secondary | ICD-10-CM | POA: Diagnosis not present

## 2016-10-16 DIAGNOSIS — E119 Type 2 diabetes mellitus without complications: Secondary | ICD-10-CM | POA: Diagnosis not present

## 2016-10-16 DIAGNOSIS — N186 End stage renal disease: Secondary | ICD-10-CM | POA: Diagnosis not present

## 2016-10-18 DIAGNOSIS — N2581 Secondary hyperparathyroidism of renal origin: Secondary | ICD-10-CM | POA: Diagnosis not present

## 2016-10-18 DIAGNOSIS — N186 End stage renal disease: Secondary | ICD-10-CM | POA: Diagnosis not present

## 2016-10-18 DIAGNOSIS — D509 Iron deficiency anemia, unspecified: Secondary | ICD-10-CM | POA: Diagnosis not present

## 2016-10-18 DIAGNOSIS — E119 Type 2 diabetes mellitus without complications: Secondary | ICD-10-CM | POA: Diagnosis not present

## 2016-10-23 DIAGNOSIS — D509 Iron deficiency anemia, unspecified: Secondary | ICD-10-CM | POA: Diagnosis not present

## 2016-10-23 DIAGNOSIS — N186 End stage renal disease: Secondary | ICD-10-CM | POA: Diagnosis not present

## 2016-10-23 DIAGNOSIS — E119 Type 2 diabetes mellitus without complications: Secondary | ICD-10-CM | POA: Diagnosis not present

## 2016-10-23 DIAGNOSIS — Z992 Dependence on renal dialysis: Secondary | ICD-10-CM | POA: Diagnosis not present

## 2016-10-23 DIAGNOSIS — N2581 Secondary hyperparathyroidism of renal origin: Secondary | ICD-10-CM | POA: Diagnosis not present

## 2016-10-23 DIAGNOSIS — I120 Hypertensive chronic kidney disease with stage 5 chronic kidney disease or end stage renal disease: Secondary | ICD-10-CM | POA: Diagnosis not present

## 2016-10-25 DIAGNOSIS — E119 Type 2 diabetes mellitus without complications: Secondary | ICD-10-CM | POA: Diagnosis not present

## 2016-10-25 DIAGNOSIS — N186 End stage renal disease: Secondary | ICD-10-CM | POA: Diagnosis not present

## 2016-10-25 DIAGNOSIS — D509 Iron deficiency anemia, unspecified: Secondary | ICD-10-CM | POA: Diagnosis not present

## 2016-10-25 DIAGNOSIS — D631 Anemia in chronic kidney disease: Secondary | ICD-10-CM | POA: Diagnosis not present

## 2016-10-25 DIAGNOSIS — N2581 Secondary hyperparathyroidism of renal origin: Secondary | ICD-10-CM | POA: Diagnosis not present

## 2016-10-28 DIAGNOSIS — D631 Anemia in chronic kidney disease: Secondary | ICD-10-CM | POA: Diagnosis not present

## 2016-10-28 DIAGNOSIS — D509 Iron deficiency anemia, unspecified: Secondary | ICD-10-CM | POA: Diagnosis not present

## 2016-10-28 DIAGNOSIS — E119 Type 2 diabetes mellitus without complications: Secondary | ICD-10-CM | POA: Diagnosis not present

## 2016-10-28 DIAGNOSIS — N2581 Secondary hyperparathyroidism of renal origin: Secondary | ICD-10-CM | POA: Diagnosis not present

## 2016-10-28 DIAGNOSIS — N186 End stage renal disease: Secondary | ICD-10-CM | POA: Diagnosis not present

## 2016-10-30 DIAGNOSIS — N2581 Secondary hyperparathyroidism of renal origin: Secondary | ICD-10-CM | POA: Diagnosis not present

## 2016-10-30 DIAGNOSIS — D631 Anemia in chronic kidney disease: Secondary | ICD-10-CM | POA: Diagnosis not present

## 2016-10-30 DIAGNOSIS — N186 End stage renal disease: Secondary | ICD-10-CM | POA: Diagnosis not present

## 2016-10-30 DIAGNOSIS — E119 Type 2 diabetes mellitus without complications: Secondary | ICD-10-CM | POA: Diagnosis not present

## 2016-10-30 DIAGNOSIS — D509 Iron deficiency anemia, unspecified: Secondary | ICD-10-CM | POA: Diagnosis not present

## 2016-11-01 DIAGNOSIS — D509 Iron deficiency anemia, unspecified: Secondary | ICD-10-CM | POA: Diagnosis not present

## 2016-11-01 DIAGNOSIS — D631 Anemia in chronic kidney disease: Secondary | ICD-10-CM | POA: Diagnosis not present

## 2016-11-01 DIAGNOSIS — E119 Type 2 diabetes mellitus without complications: Secondary | ICD-10-CM | POA: Diagnosis not present

## 2016-11-01 DIAGNOSIS — N186 End stage renal disease: Secondary | ICD-10-CM | POA: Diagnosis not present

## 2016-11-01 DIAGNOSIS — N2581 Secondary hyperparathyroidism of renal origin: Secondary | ICD-10-CM | POA: Diagnosis not present

## 2016-11-04 DIAGNOSIS — N2581 Secondary hyperparathyroidism of renal origin: Secondary | ICD-10-CM | POA: Diagnosis not present

## 2016-11-04 DIAGNOSIS — E119 Type 2 diabetes mellitus without complications: Secondary | ICD-10-CM | POA: Diagnosis not present

## 2016-11-04 DIAGNOSIS — N186 End stage renal disease: Secondary | ICD-10-CM | POA: Diagnosis not present

## 2016-11-04 DIAGNOSIS — D509 Iron deficiency anemia, unspecified: Secondary | ICD-10-CM | POA: Diagnosis not present

## 2016-11-04 DIAGNOSIS — D631 Anemia in chronic kidney disease: Secondary | ICD-10-CM | POA: Diagnosis not present

## 2016-11-06 DIAGNOSIS — E119 Type 2 diabetes mellitus without complications: Secondary | ICD-10-CM | POA: Diagnosis not present

## 2016-11-06 DIAGNOSIS — D509 Iron deficiency anemia, unspecified: Secondary | ICD-10-CM | POA: Diagnosis not present

## 2016-11-06 DIAGNOSIS — N2581 Secondary hyperparathyroidism of renal origin: Secondary | ICD-10-CM | POA: Diagnosis not present

## 2016-11-06 DIAGNOSIS — N186 End stage renal disease: Secondary | ICD-10-CM | POA: Diagnosis not present

## 2016-11-06 DIAGNOSIS — D631 Anemia in chronic kidney disease: Secondary | ICD-10-CM | POA: Diagnosis not present

## 2016-11-08 DIAGNOSIS — E119 Type 2 diabetes mellitus without complications: Secondary | ICD-10-CM | POA: Diagnosis not present

## 2016-11-08 DIAGNOSIS — D631 Anemia in chronic kidney disease: Secondary | ICD-10-CM | POA: Diagnosis not present

## 2016-11-08 DIAGNOSIS — N2581 Secondary hyperparathyroidism of renal origin: Secondary | ICD-10-CM | POA: Diagnosis not present

## 2016-11-08 DIAGNOSIS — N186 End stage renal disease: Secondary | ICD-10-CM | POA: Diagnosis not present

## 2016-11-08 DIAGNOSIS — D509 Iron deficiency anemia, unspecified: Secondary | ICD-10-CM | POA: Diagnosis not present

## 2016-11-11 DIAGNOSIS — D509 Iron deficiency anemia, unspecified: Secondary | ICD-10-CM | POA: Diagnosis not present

## 2016-11-11 DIAGNOSIS — D631 Anemia in chronic kidney disease: Secondary | ICD-10-CM | POA: Diagnosis not present

## 2016-11-11 DIAGNOSIS — N186 End stage renal disease: Secondary | ICD-10-CM | POA: Diagnosis not present

## 2016-11-11 DIAGNOSIS — E119 Type 2 diabetes mellitus without complications: Secondary | ICD-10-CM | POA: Diagnosis not present

## 2016-11-11 DIAGNOSIS — N2581 Secondary hyperparathyroidism of renal origin: Secondary | ICD-10-CM | POA: Diagnosis not present

## 2016-11-13 DIAGNOSIS — E119 Type 2 diabetes mellitus without complications: Secondary | ICD-10-CM | POA: Diagnosis not present

## 2016-11-13 DIAGNOSIS — N2581 Secondary hyperparathyroidism of renal origin: Secondary | ICD-10-CM | POA: Diagnosis not present

## 2016-11-13 DIAGNOSIS — D631 Anemia in chronic kidney disease: Secondary | ICD-10-CM | POA: Diagnosis not present

## 2016-11-13 DIAGNOSIS — D509 Iron deficiency anemia, unspecified: Secondary | ICD-10-CM | POA: Diagnosis not present

## 2016-11-13 DIAGNOSIS — N186 End stage renal disease: Secondary | ICD-10-CM | POA: Diagnosis not present

## 2016-11-15 DIAGNOSIS — E119 Type 2 diabetes mellitus without complications: Secondary | ICD-10-CM | POA: Diagnosis not present

## 2016-11-15 DIAGNOSIS — N186 End stage renal disease: Secondary | ICD-10-CM | POA: Diagnosis not present

## 2016-11-15 DIAGNOSIS — D631 Anemia in chronic kidney disease: Secondary | ICD-10-CM | POA: Diagnosis not present

## 2016-11-15 DIAGNOSIS — D509 Iron deficiency anemia, unspecified: Secondary | ICD-10-CM | POA: Diagnosis not present

## 2016-11-15 DIAGNOSIS — N2581 Secondary hyperparathyroidism of renal origin: Secondary | ICD-10-CM | POA: Diagnosis not present

## 2016-11-18 DIAGNOSIS — D631 Anemia in chronic kidney disease: Secondary | ICD-10-CM | POA: Diagnosis not present

## 2016-11-18 DIAGNOSIS — D509 Iron deficiency anemia, unspecified: Secondary | ICD-10-CM | POA: Diagnosis not present

## 2016-11-18 DIAGNOSIS — E119 Type 2 diabetes mellitus without complications: Secondary | ICD-10-CM | POA: Diagnosis not present

## 2016-11-18 DIAGNOSIS — N2581 Secondary hyperparathyroidism of renal origin: Secondary | ICD-10-CM | POA: Diagnosis not present

## 2016-11-18 DIAGNOSIS — N186 End stage renal disease: Secondary | ICD-10-CM | POA: Diagnosis not present

## 2016-11-20 DIAGNOSIS — D509 Iron deficiency anemia, unspecified: Secondary | ICD-10-CM | POA: Diagnosis not present

## 2016-11-20 DIAGNOSIS — E119 Type 2 diabetes mellitus without complications: Secondary | ICD-10-CM | POA: Diagnosis not present

## 2016-11-20 DIAGNOSIS — D631 Anemia in chronic kidney disease: Secondary | ICD-10-CM | POA: Diagnosis not present

## 2016-11-20 DIAGNOSIS — N2581 Secondary hyperparathyroidism of renal origin: Secondary | ICD-10-CM | POA: Diagnosis not present

## 2016-11-20 DIAGNOSIS — N186 End stage renal disease: Secondary | ICD-10-CM | POA: Diagnosis not present

## 2016-11-22 DIAGNOSIS — I120 Hypertensive chronic kidney disease with stage 5 chronic kidney disease or end stage renal disease: Secondary | ICD-10-CM | POA: Diagnosis not present

## 2016-11-22 DIAGNOSIS — Z992 Dependence on renal dialysis: Secondary | ICD-10-CM | POA: Diagnosis not present

## 2016-11-22 DIAGNOSIS — N186 End stage renal disease: Secondary | ICD-10-CM | POA: Diagnosis not present

## 2016-11-25 DIAGNOSIS — N186 End stage renal disease: Secondary | ICD-10-CM | POA: Diagnosis not present

## 2016-11-25 DIAGNOSIS — D631 Anemia in chronic kidney disease: Secondary | ICD-10-CM | POA: Diagnosis not present

## 2016-11-25 DIAGNOSIS — N2581 Secondary hyperparathyroidism of renal origin: Secondary | ICD-10-CM | POA: Diagnosis not present

## 2016-11-25 DIAGNOSIS — D509 Iron deficiency anemia, unspecified: Secondary | ICD-10-CM | POA: Diagnosis not present

## 2016-11-25 DIAGNOSIS — E119 Type 2 diabetes mellitus without complications: Secondary | ICD-10-CM | POA: Diagnosis not present

## 2016-11-27 DIAGNOSIS — E119 Type 2 diabetes mellitus without complications: Secondary | ICD-10-CM | POA: Diagnosis not present

## 2016-11-27 DIAGNOSIS — D509 Iron deficiency anemia, unspecified: Secondary | ICD-10-CM | POA: Diagnosis not present

## 2016-11-27 DIAGNOSIS — D631 Anemia in chronic kidney disease: Secondary | ICD-10-CM | POA: Diagnosis not present

## 2016-11-27 DIAGNOSIS — N2581 Secondary hyperparathyroidism of renal origin: Secondary | ICD-10-CM | POA: Diagnosis not present

## 2016-11-27 DIAGNOSIS — N186 End stage renal disease: Secondary | ICD-10-CM | POA: Diagnosis not present

## 2016-11-29 DIAGNOSIS — E119 Type 2 diabetes mellitus without complications: Secondary | ICD-10-CM | POA: Diagnosis not present

## 2016-11-29 DIAGNOSIS — N2581 Secondary hyperparathyroidism of renal origin: Secondary | ICD-10-CM | POA: Diagnosis not present

## 2016-11-29 DIAGNOSIS — D631 Anemia in chronic kidney disease: Secondary | ICD-10-CM | POA: Diagnosis not present

## 2016-11-29 DIAGNOSIS — N186 End stage renal disease: Secondary | ICD-10-CM | POA: Diagnosis not present

## 2016-11-29 DIAGNOSIS — D509 Iron deficiency anemia, unspecified: Secondary | ICD-10-CM | POA: Diagnosis not present

## 2016-12-03 DIAGNOSIS — N186 End stage renal disease: Secondary | ICD-10-CM | POA: Diagnosis not present

## 2016-12-03 DIAGNOSIS — E119 Type 2 diabetes mellitus without complications: Secondary | ICD-10-CM | POA: Diagnosis not present

## 2016-12-03 DIAGNOSIS — D631 Anemia in chronic kidney disease: Secondary | ICD-10-CM | POA: Diagnosis not present

## 2016-12-03 DIAGNOSIS — N2581 Secondary hyperparathyroidism of renal origin: Secondary | ICD-10-CM | POA: Diagnosis not present

## 2016-12-03 DIAGNOSIS — D509 Iron deficiency anemia, unspecified: Secondary | ICD-10-CM | POA: Diagnosis not present

## 2016-12-04 DIAGNOSIS — E119 Type 2 diabetes mellitus without complications: Secondary | ICD-10-CM | POA: Diagnosis not present

## 2016-12-04 DIAGNOSIS — E1129 Type 2 diabetes mellitus with other diabetic kidney complication: Secondary | ICD-10-CM | POA: Diagnosis not present

## 2016-12-04 DIAGNOSIS — D509 Iron deficiency anemia, unspecified: Secondary | ICD-10-CM | POA: Diagnosis not present

## 2016-12-04 DIAGNOSIS — D631 Anemia in chronic kidney disease: Secondary | ICD-10-CM | POA: Diagnosis not present

## 2016-12-04 DIAGNOSIS — N186 End stage renal disease: Secondary | ICD-10-CM | POA: Diagnosis not present

## 2016-12-04 DIAGNOSIS — N2581 Secondary hyperparathyroidism of renal origin: Secondary | ICD-10-CM | POA: Diagnosis not present

## 2016-12-06 DIAGNOSIS — E119 Type 2 diabetes mellitus without complications: Secondary | ICD-10-CM | POA: Diagnosis not present

## 2016-12-06 DIAGNOSIS — N2581 Secondary hyperparathyroidism of renal origin: Secondary | ICD-10-CM | POA: Diagnosis not present

## 2016-12-06 DIAGNOSIS — D631 Anemia in chronic kidney disease: Secondary | ICD-10-CM | POA: Diagnosis not present

## 2016-12-06 DIAGNOSIS — D509 Iron deficiency anemia, unspecified: Secondary | ICD-10-CM | POA: Diagnosis not present

## 2016-12-06 DIAGNOSIS — N186 End stage renal disease: Secondary | ICD-10-CM | POA: Diagnosis not present

## 2016-12-09 DIAGNOSIS — N186 End stage renal disease: Secondary | ICD-10-CM | POA: Diagnosis not present

## 2016-12-09 DIAGNOSIS — N2581 Secondary hyperparathyroidism of renal origin: Secondary | ICD-10-CM | POA: Diagnosis not present

## 2016-12-09 DIAGNOSIS — E119 Type 2 diabetes mellitus without complications: Secondary | ICD-10-CM | POA: Diagnosis not present

## 2016-12-09 DIAGNOSIS — D631 Anemia in chronic kidney disease: Secondary | ICD-10-CM | POA: Diagnosis not present

## 2016-12-09 DIAGNOSIS — D509 Iron deficiency anemia, unspecified: Secondary | ICD-10-CM | POA: Diagnosis not present

## 2016-12-11 DIAGNOSIS — D631 Anemia in chronic kidney disease: Secondary | ICD-10-CM | POA: Diagnosis not present

## 2016-12-11 DIAGNOSIS — E119 Type 2 diabetes mellitus without complications: Secondary | ICD-10-CM | POA: Diagnosis not present

## 2016-12-11 DIAGNOSIS — D509 Iron deficiency anemia, unspecified: Secondary | ICD-10-CM | POA: Diagnosis not present

## 2016-12-11 DIAGNOSIS — N186 End stage renal disease: Secondary | ICD-10-CM | POA: Diagnosis not present

## 2016-12-11 DIAGNOSIS — N2581 Secondary hyperparathyroidism of renal origin: Secondary | ICD-10-CM | POA: Diagnosis not present

## 2016-12-13 DIAGNOSIS — E119 Type 2 diabetes mellitus without complications: Secondary | ICD-10-CM | POA: Diagnosis not present

## 2016-12-13 DIAGNOSIS — N186 End stage renal disease: Secondary | ICD-10-CM | POA: Diagnosis not present

## 2016-12-13 DIAGNOSIS — D509 Iron deficiency anemia, unspecified: Secondary | ICD-10-CM | POA: Diagnosis not present

## 2016-12-13 DIAGNOSIS — D631 Anemia in chronic kidney disease: Secondary | ICD-10-CM | POA: Diagnosis not present

## 2016-12-13 DIAGNOSIS — N2581 Secondary hyperparathyroidism of renal origin: Secondary | ICD-10-CM | POA: Diagnosis not present

## 2016-12-16 DIAGNOSIS — N2581 Secondary hyperparathyroidism of renal origin: Secondary | ICD-10-CM | POA: Diagnosis not present

## 2016-12-16 DIAGNOSIS — D509 Iron deficiency anemia, unspecified: Secondary | ICD-10-CM | POA: Diagnosis not present

## 2016-12-16 DIAGNOSIS — N186 End stage renal disease: Secondary | ICD-10-CM | POA: Diagnosis not present

## 2016-12-16 DIAGNOSIS — D631 Anemia in chronic kidney disease: Secondary | ICD-10-CM | POA: Diagnosis not present

## 2016-12-16 DIAGNOSIS — E119 Type 2 diabetes mellitus without complications: Secondary | ICD-10-CM | POA: Diagnosis not present

## 2016-12-18 DIAGNOSIS — N186 End stage renal disease: Secondary | ICD-10-CM | POA: Diagnosis not present

## 2016-12-18 DIAGNOSIS — D509 Iron deficiency anemia, unspecified: Secondary | ICD-10-CM | POA: Diagnosis not present

## 2016-12-18 DIAGNOSIS — E119 Type 2 diabetes mellitus without complications: Secondary | ICD-10-CM | POA: Diagnosis not present

## 2016-12-18 DIAGNOSIS — N2581 Secondary hyperparathyroidism of renal origin: Secondary | ICD-10-CM | POA: Diagnosis not present

## 2016-12-18 DIAGNOSIS — D631 Anemia in chronic kidney disease: Secondary | ICD-10-CM | POA: Diagnosis not present

## 2016-12-20 DIAGNOSIS — E119 Type 2 diabetes mellitus without complications: Secondary | ICD-10-CM | POA: Diagnosis not present

## 2016-12-20 DIAGNOSIS — D631 Anemia in chronic kidney disease: Secondary | ICD-10-CM | POA: Diagnosis not present

## 2016-12-20 DIAGNOSIS — D509 Iron deficiency anemia, unspecified: Secondary | ICD-10-CM | POA: Diagnosis not present

## 2016-12-20 DIAGNOSIS — N2581 Secondary hyperparathyroidism of renal origin: Secondary | ICD-10-CM | POA: Diagnosis not present

## 2016-12-20 DIAGNOSIS — N186 End stage renal disease: Secondary | ICD-10-CM | POA: Diagnosis not present

## 2016-12-23 DIAGNOSIS — N2581 Secondary hyperparathyroidism of renal origin: Secondary | ICD-10-CM | POA: Diagnosis not present

## 2016-12-23 DIAGNOSIS — N186 End stage renal disease: Secondary | ICD-10-CM | POA: Diagnosis not present

## 2016-12-23 DIAGNOSIS — D509 Iron deficiency anemia, unspecified: Secondary | ICD-10-CM | POA: Diagnosis not present

## 2016-12-23 DIAGNOSIS — E119 Type 2 diabetes mellitus without complications: Secondary | ICD-10-CM | POA: Diagnosis not present

## 2016-12-23 DIAGNOSIS — Z992 Dependence on renal dialysis: Secondary | ICD-10-CM | POA: Diagnosis not present

## 2016-12-23 DIAGNOSIS — I120 Hypertensive chronic kidney disease with stage 5 chronic kidney disease or end stage renal disease: Secondary | ICD-10-CM | POA: Diagnosis not present

## 2016-12-23 DIAGNOSIS — D631 Anemia in chronic kidney disease: Secondary | ICD-10-CM | POA: Diagnosis not present

## 2016-12-25 DIAGNOSIS — D509 Iron deficiency anemia, unspecified: Secondary | ICD-10-CM | POA: Diagnosis not present

## 2016-12-25 DIAGNOSIS — E119 Type 2 diabetes mellitus without complications: Secondary | ICD-10-CM | POA: Diagnosis not present

## 2016-12-25 DIAGNOSIS — N186 End stage renal disease: Secondary | ICD-10-CM | POA: Diagnosis not present

## 2016-12-25 DIAGNOSIS — N2581 Secondary hyperparathyroidism of renal origin: Secondary | ICD-10-CM | POA: Diagnosis not present

## 2016-12-25 DIAGNOSIS — D631 Anemia in chronic kidney disease: Secondary | ICD-10-CM | POA: Diagnosis not present

## 2016-12-27 DIAGNOSIS — D509 Iron deficiency anemia, unspecified: Secondary | ICD-10-CM | POA: Diagnosis not present

## 2016-12-27 DIAGNOSIS — D631 Anemia in chronic kidney disease: Secondary | ICD-10-CM | POA: Diagnosis not present

## 2016-12-27 DIAGNOSIS — N2581 Secondary hyperparathyroidism of renal origin: Secondary | ICD-10-CM | POA: Diagnosis not present

## 2016-12-27 DIAGNOSIS — N186 End stage renal disease: Secondary | ICD-10-CM | POA: Diagnosis not present

## 2016-12-27 DIAGNOSIS — E119 Type 2 diabetes mellitus without complications: Secondary | ICD-10-CM | POA: Diagnosis not present

## 2017-01-01 DIAGNOSIS — D631 Anemia in chronic kidney disease: Secondary | ICD-10-CM | POA: Diagnosis not present

## 2017-01-01 DIAGNOSIS — E119 Type 2 diabetes mellitus without complications: Secondary | ICD-10-CM | POA: Diagnosis not present

## 2017-01-01 DIAGNOSIS — N2581 Secondary hyperparathyroidism of renal origin: Secondary | ICD-10-CM | POA: Diagnosis not present

## 2017-01-01 DIAGNOSIS — N186 End stage renal disease: Secondary | ICD-10-CM | POA: Diagnosis not present

## 2017-01-01 DIAGNOSIS — D509 Iron deficiency anemia, unspecified: Secondary | ICD-10-CM | POA: Diagnosis not present

## 2017-01-03 DIAGNOSIS — E119 Type 2 diabetes mellitus without complications: Secondary | ICD-10-CM | POA: Diagnosis not present

## 2017-01-03 DIAGNOSIS — N2581 Secondary hyperparathyroidism of renal origin: Secondary | ICD-10-CM | POA: Diagnosis not present

## 2017-01-03 DIAGNOSIS — D509 Iron deficiency anemia, unspecified: Secondary | ICD-10-CM | POA: Diagnosis not present

## 2017-01-03 DIAGNOSIS — N186 End stage renal disease: Secondary | ICD-10-CM | POA: Diagnosis not present

## 2017-01-03 DIAGNOSIS — D631 Anemia in chronic kidney disease: Secondary | ICD-10-CM | POA: Diagnosis not present

## 2017-01-06 DIAGNOSIS — D509 Iron deficiency anemia, unspecified: Secondary | ICD-10-CM | POA: Diagnosis not present

## 2017-01-06 DIAGNOSIS — E119 Type 2 diabetes mellitus without complications: Secondary | ICD-10-CM | POA: Diagnosis not present

## 2017-01-06 DIAGNOSIS — N2581 Secondary hyperparathyroidism of renal origin: Secondary | ICD-10-CM | POA: Diagnosis not present

## 2017-01-06 DIAGNOSIS — N186 End stage renal disease: Secondary | ICD-10-CM | POA: Diagnosis not present

## 2017-01-06 DIAGNOSIS — D631 Anemia in chronic kidney disease: Secondary | ICD-10-CM | POA: Diagnosis not present

## 2017-01-08 DIAGNOSIS — D631 Anemia in chronic kidney disease: Secondary | ICD-10-CM | POA: Diagnosis not present

## 2017-01-08 DIAGNOSIS — E119 Type 2 diabetes mellitus without complications: Secondary | ICD-10-CM | POA: Diagnosis not present

## 2017-01-08 DIAGNOSIS — N2581 Secondary hyperparathyroidism of renal origin: Secondary | ICD-10-CM | POA: Diagnosis not present

## 2017-01-08 DIAGNOSIS — N186 End stage renal disease: Secondary | ICD-10-CM | POA: Diagnosis not present

## 2017-01-08 DIAGNOSIS — D509 Iron deficiency anemia, unspecified: Secondary | ICD-10-CM | POA: Diagnosis not present

## 2017-01-10 DIAGNOSIS — E119 Type 2 diabetes mellitus without complications: Secondary | ICD-10-CM | POA: Diagnosis not present

## 2017-01-10 DIAGNOSIS — N2581 Secondary hyperparathyroidism of renal origin: Secondary | ICD-10-CM | POA: Diagnosis not present

## 2017-01-10 DIAGNOSIS — D631 Anemia in chronic kidney disease: Secondary | ICD-10-CM | POA: Diagnosis not present

## 2017-01-10 DIAGNOSIS — N186 End stage renal disease: Secondary | ICD-10-CM | POA: Diagnosis not present

## 2017-01-10 DIAGNOSIS — D509 Iron deficiency anemia, unspecified: Secondary | ICD-10-CM | POA: Diagnosis not present

## 2017-01-13 DIAGNOSIS — N186 End stage renal disease: Secondary | ICD-10-CM | POA: Diagnosis not present

## 2017-01-13 DIAGNOSIS — E119 Type 2 diabetes mellitus without complications: Secondary | ICD-10-CM | POA: Diagnosis not present

## 2017-01-13 DIAGNOSIS — D509 Iron deficiency anemia, unspecified: Secondary | ICD-10-CM | POA: Diagnosis not present

## 2017-01-13 DIAGNOSIS — D631 Anemia in chronic kidney disease: Secondary | ICD-10-CM | POA: Diagnosis not present

## 2017-01-13 DIAGNOSIS — N2581 Secondary hyperparathyroidism of renal origin: Secondary | ICD-10-CM | POA: Diagnosis not present

## 2017-01-15 DIAGNOSIS — D631 Anemia in chronic kidney disease: Secondary | ICD-10-CM | POA: Diagnosis not present

## 2017-01-15 DIAGNOSIS — E119 Type 2 diabetes mellitus without complications: Secondary | ICD-10-CM | POA: Diagnosis not present

## 2017-01-15 DIAGNOSIS — N186 End stage renal disease: Secondary | ICD-10-CM | POA: Diagnosis not present

## 2017-01-15 DIAGNOSIS — D509 Iron deficiency anemia, unspecified: Secondary | ICD-10-CM | POA: Diagnosis not present

## 2017-01-15 DIAGNOSIS — N2581 Secondary hyperparathyroidism of renal origin: Secondary | ICD-10-CM | POA: Diagnosis not present

## 2017-01-17 DIAGNOSIS — N186 End stage renal disease: Secondary | ICD-10-CM | POA: Diagnosis not present

## 2017-01-17 DIAGNOSIS — N2581 Secondary hyperparathyroidism of renal origin: Secondary | ICD-10-CM | POA: Diagnosis not present

## 2017-01-17 DIAGNOSIS — E119 Type 2 diabetes mellitus without complications: Secondary | ICD-10-CM | POA: Diagnosis not present

## 2017-01-17 DIAGNOSIS — D631 Anemia in chronic kidney disease: Secondary | ICD-10-CM | POA: Diagnosis not present

## 2017-01-17 DIAGNOSIS — D509 Iron deficiency anemia, unspecified: Secondary | ICD-10-CM | POA: Diagnosis not present

## 2017-01-20 DIAGNOSIS — N2581 Secondary hyperparathyroidism of renal origin: Secondary | ICD-10-CM | POA: Diagnosis not present

## 2017-01-20 DIAGNOSIS — D509 Iron deficiency anemia, unspecified: Secondary | ICD-10-CM | POA: Diagnosis not present

## 2017-01-20 DIAGNOSIS — E119 Type 2 diabetes mellitus without complications: Secondary | ICD-10-CM | POA: Diagnosis not present

## 2017-01-20 DIAGNOSIS — N186 End stage renal disease: Secondary | ICD-10-CM | POA: Diagnosis not present

## 2017-01-20 DIAGNOSIS — D631 Anemia in chronic kidney disease: Secondary | ICD-10-CM | POA: Diagnosis not present

## 2017-01-22 DIAGNOSIS — N186 End stage renal disease: Secondary | ICD-10-CM | POA: Diagnosis not present

## 2017-01-22 DIAGNOSIS — N2581 Secondary hyperparathyroidism of renal origin: Secondary | ICD-10-CM | POA: Diagnosis not present

## 2017-01-22 DIAGNOSIS — D631 Anemia in chronic kidney disease: Secondary | ICD-10-CM | POA: Diagnosis not present

## 2017-01-22 DIAGNOSIS — D509 Iron deficiency anemia, unspecified: Secondary | ICD-10-CM | POA: Diagnosis not present

## 2017-01-22 DIAGNOSIS — E119 Type 2 diabetes mellitus without complications: Secondary | ICD-10-CM | POA: Diagnosis not present

## 2017-01-23 DIAGNOSIS — Z992 Dependence on renal dialysis: Secondary | ICD-10-CM | POA: Diagnosis not present

## 2017-01-23 DIAGNOSIS — I120 Hypertensive chronic kidney disease with stage 5 chronic kidney disease or end stage renal disease: Secondary | ICD-10-CM | POA: Diagnosis not present

## 2017-01-23 DIAGNOSIS — N186 End stage renal disease: Secondary | ICD-10-CM | POA: Diagnosis not present

## 2017-01-24 DIAGNOSIS — E119 Type 2 diabetes mellitus without complications: Secondary | ICD-10-CM | POA: Diagnosis not present

## 2017-01-24 DIAGNOSIS — D509 Iron deficiency anemia, unspecified: Secondary | ICD-10-CM | POA: Diagnosis not present

## 2017-01-24 DIAGNOSIS — N186 End stage renal disease: Secondary | ICD-10-CM | POA: Diagnosis not present

## 2017-01-24 DIAGNOSIS — D631 Anemia in chronic kidney disease: Secondary | ICD-10-CM | POA: Diagnosis not present

## 2017-01-24 DIAGNOSIS — Z23 Encounter for immunization: Secondary | ICD-10-CM | POA: Diagnosis not present

## 2017-01-24 DIAGNOSIS — N2581 Secondary hyperparathyroidism of renal origin: Secondary | ICD-10-CM | POA: Diagnosis not present

## 2017-01-27 DIAGNOSIS — N2581 Secondary hyperparathyroidism of renal origin: Secondary | ICD-10-CM | POA: Diagnosis not present

## 2017-01-27 DIAGNOSIS — E119 Type 2 diabetes mellitus without complications: Secondary | ICD-10-CM | POA: Diagnosis not present

## 2017-01-27 DIAGNOSIS — D509 Iron deficiency anemia, unspecified: Secondary | ICD-10-CM | POA: Diagnosis not present

## 2017-01-27 DIAGNOSIS — N186 End stage renal disease: Secondary | ICD-10-CM | POA: Diagnosis not present

## 2017-01-27 DIAGNOSIS — Z23 Encounter for immunization: Secondary | ICD-10-CM | POA: Diagnosis not present

## 2017-01-27 DIAGNOSIS — D631 Anemia in chronic kidney disease: Secondary | ICD-10-CM | POA: Diagnosis not present

## 2017-01-29 DIAGNOSIS — D631 Anemia in chronic kidney disease: Secondary | ICD-10-CM | POA: Diagnosis not present

## 2017-01-29 DIAGNOSIS — N186 End stage renal disease: Secondary | ICD-10-CM | POA: Diagnosis not present

## 2017-01-29 DIAGNOSIS — Z23 Encounter for immunization: Secondary | ICD-10-CM | POA: Diagnosis not present

## 2017-01-29 DIAGNOSIS — N2581 Secondary hyperparathyroidism of renal origin: Secondary | ICD-10-CM | POA: Diagnosis not present

## 2017-01-29 DIAGNOSIS — E119 Type 2 diabetes mellitus without complications: Secondary | ICD-10-CM | POA: Diagnosis not present

## 2017-01-29 DIAGNOSIS — D509 Iron deficiency anemia, unspecified: Secondary | ICD-10-CM | POA: Diagnosis not present

## 2017-01-31 DIAGNOSIS — D631 Anemia in chronic kidney disease: Secondary | ICD-10-CM | POA: Diagnosis not present

## 2017-01-31 DIAGNOSIS — Z23 Encounter for immunization: Secondary | ICD-10-CM | POA: Diagnosis not present

## 2017-01-31 DIAGNOSIS — E119 Type 2 diabetes mellitus without complications: Secondary | ICD-10-CM | POA: Diagnosis not present

## 2017-01-31 DIAGNOSIS — N186 End stage renal disease: Secondary | ICD-10-CM | POA: Diagnosis not present

## 2017-01-31 DIAGNOSIS — D509 Iron deficiency anemia, unspecified: Secondary | ICD-10-CM | POA: Diagnosis not present

## 2017-01-31 DIAGNOSIS — N2581 Secondary hyperparathyroidism of renal origin: Secondary | ICD-10-CM | POA: Diagnosis not present

## 2017-02-03 DIAGNOSIS — E119 Type 2 diabetes mellitus without complications: Secondary | ICD-10-CM | POA: Diagnosis not present

## 2017-02-03 DIAGNOSIS — Z23 Encounter for immunization: Secondary | ICD-10-CM | POA: Diagnosis not present

## 2017-02-03 DIAGNOSIS — N186 End stage renal disease: Secondary | ICD-10-CM | POA: Diagnosis not present

## 2017-02-03 DIAGNOSIS — N2581 Secondary hyperparathyroidism of renal origin: Secondary | ICD-10-CM | POA: Diagnosis not present

## 2017-02-03 DIAGNOSIS — D631 Anemia in chronic kidney disease: Secondary | ICD-10-CM | POA: Diagnosis not present

## 2017-02-03 DIAGNOSIS — D509 Iron deficiency anemia, unspecified: Secondary | ICD-10-CM | POA: Diagnosis not present

## 2017-02-05 DIAGNOSIS — D631 Anemia in chronic kidney disease: Secondary | ICD-10-CM | POA: Diagnosis not present

## 2017-02-05 DIAGNOSIS — E119 Type 2 diabetes mellitus without complications: Secondary | ICD-10-CM | POA: Diagnosis not present

## 2017-02-05 DIAGNOSIS — Z23 Encounter for immunization: Secondary | ICD-10-CM | POA: Diagnosis not present

## 2017-02-05 DIAGNOSIS — N186 End stage renal disease: Secondary | ICD-10-CM | POA: Diagnosis not present

## 2017-02-05 DIAGNOSIS — D509 Iron deficiency anemia, unspecified: Secondary | ICD-10-CM | POA: Diagnosis not present

## 2017-02-05 DIAGNOSIS — N2581 Secondary hyperparathyroidism of renal origin: Secondary | ICD-10-CM | POA: Diagnosis not present

## 2017-02-10 DIAGNOSIS — E119 Type 2 diabetes mellitus without complications: Secondary | ICD-10-CM | POA: Diagnosis not present

## 2017-02-10 DIAGNOSIS — D509 Iron deficiency anemia, unspecified: Secondary | ICD-10-CM | POA: Diagnosis not present

## 2017-02-10 DIAGNOSIS — D631 Anemia in chronic kidney disease: Secondary | ICD-10-CM | POA: Diagnosis not present

## 2017-02-10 DIAGNOSIS — Z23 Encounter for immunization: Secondary | ICD-10-CM | POA: Diagnosis not present

## 2017-02-10 DIAGNOSIS — N2581 Secondary hyperparathyroidism of renal origin: Secondary | ICD-10-CM | POA: Diagnosis not present

## 2017-02-10 DIAGNOSIS — N186 End stage renal disease: Secondary | ICD-10-CM | POA: Diagnosis not present

## 2017-02-12 DIAGNOSIS — Z23 Encounter for immunization: Secondary | ICD-10-CM | POA: Diagnosis not present

## 2017-02-12 DIAGNOSIS — E119 Type 2 diabetes mellitus without complications: Secondary | ICD-10-CM | POA: Diagnosis not present

## 2017-02-12 DIAGNOSIS — N2581 Secondary hyperparathyroidism of renal origin: Secondary | ICD-10-CM | POA: Diagnosis not present

## 2017-02-12 DIAGNOSIS — N186 End stage renal disease: Secondary | ICD-10-CM | POA: Diagnosis not present

## 2017-02-12 DIAGNOSIS — D509 Iron deficiency anemia, unspecified: Secondary | ICD-10-CM | POA: Diagnosis not present

## 2017-02-12 DIAGNOSIS — D631 Anemia in chronic kidney disease: Secondary | ICD-10-CM | POA: Diagnosis not present

## 2017-02-14 DIAGNOSIS — E119 Type 2 diabetes mellitus without complications: Secondary | ICD-10-CM | POA: Diagnosis not present

## 2017-02-14 DIAGNOSIS — D509 Iron deficiency anemia, unspecified: Secondary | ICD-10-CM | POA: Diagnosis not present

## 2017-02-14 DIAGNOSIS — D631 Anemia in chronic kidney disease: Secondary | ICD-10-CM | POA: Diagnosis not present

## 2017-02-14 DIAGNOSIS — N186 End stage renal disease: Secondary | ICD-10-CM | POA: Diagnosis not present

## 2017-02-14 DIAGNOSIS — N2581 Secondary hyperparathyroidism of renal origin: Secondary | ICD-10-CM | POA: Diagnosis not present

## 2017-02-14 DIAGNOSIS — Z23 Encounter for immunization: Secondary | ICD-10-CM | POA: Diagnosis not present

## 2017-02-17 DIAGNOSIS — E119 Type 2 diabetes mellitus without complications: Secondary | ICD-10-CM | POA: Diagnosis not present

## 2017-02-17 DIAGNOSIS — D631 Anemia in chronic kidney disease: Secondary | ICD-10-CM | POA: Diagnosis not present

## 2017-02-17 DIAGNOSIS — Z23 Encounter for immunization: Secondary | ICD-10-CM | POA: Diagnosis not present

## 2017-02-17 DIAGNOSIS — N2581 Secondary hyperparathyroidism of renal origin: Secondary | ICD-10-CM | POA: Diagnosis not present

## 2017-02-17 DIAGNOSIS — N186 End stage renal disease: Secondary | ICD-10-CM | POA: Diagnosis not present

## 2017-02-17 DIAGNOSIS — D509 Iron deficiency anemia, unspecified: Secondary | ICD-10-CM | POA: Diagnosis not present

## 2017-02-19 DIAGNOSIS — D631 Anemia in chronic kidney disease: Secondary | ICD-10-CM | POA: Diagnosis not present

## 2017-02-19 DIAGNOSIS — E119 Type 2 diabetes mellitus without complications: Secondary | ICD-10-CM | POA: Diagnosis not present

## 2017-02-19 DIAGNOSIS — N2581 Secondary hyperparathyroidism of renal origin: Secondary | ICD-10-CM | POA: Diagnosis not present

## 2017-02-19 DIAGNOSIS — D509 Iron deficiency anemia, unspecified: Secondary | ICD-10-CM | POA: Diagnosis not present

## 2017-02-19 DIAGNOSIS — Z23 Encounter for immunization: Secondary | ICD-10-CM | POA: Diagnosis not present

## 2017-02-19 DIAGNOSIS — N186 End stage renal disease: Secondary | ICD-10-CM | POA: Diagnosis not present

## 2017-02-21 DIAGNOSIS — D631 Anemia in chronic kidney disease: Secondary | ICD-10-CM | POA: Diagnosis not present

## 2017-02-21 DIAGNOSIS — N186 End stage renal disease: Secondary | ICD-10-CM | POA: Diagnosis not present

## 2017-02-21 DIAGNOSIS — D509 Iron deficiency anemia, unspecified: Secondary | ICD-10-CM | POA: Diagnosis not present

## 2017-02-21 DIAGNOSIS — E119 Type 2 diabetes mellitus without complications: Secondary | ICD-10-CM | POA: Diagnosis not present

## 2017-02-21 DIAGNOSIS — N2581 Secondary hyperparathyroidism of renal origin: Secondary | ICD-10-CM | POA: Diagnosis not present

## 2017-02-21 DIAGNOSIS — Z23 Encounter for immunization: Secondary | ICD-10-CM | POA: Diagnosis not present

## 2017-02-22 DIAGNOSIS — Z992 Dependence on renal dialysis: Secondary | ICD-10-CM | POA: Diagnosis not present

## 2017-02-22 DIAGNOSIS — I120 Hypertensive chronic kidney disease with stage 5 chronic kidney disease or end stage renal disease: Secondary | ICD-10-CM | POA: Diagnosis not present

## 2017-02-22 DIAGNOSIS — N186 End stage renal disease: Secondary | ICD-10-CM | POA: Diagnosis not present

## 2017-02-24 DIAGNOSIS — N186 End stage renal disease: Secondary | ICD-10-CM | POA: Diagnosis not present

## 2017-02-24 DIAGNOSIS — E119 Type 2 diabetes mellitus without complications: Secondary | ICD-10-CM | POA: Diagnosis not present

## 2017-02-24 DIAGNOSIS — N2581 Secondary hyperparathyroidism of renal origin: Secondary | ICD-10-CM | POA: Diagnosis not present

## 2017-02-24 DIAGNOSIS — D509 Iron deficiency anemia, unspecified: Secondary | ICD-10-CM | POA: Diagnosis not present

## 2017-02-26 DIAGNOSIS — E119 Type 2 diabetes mellitus without complications: Secondary | ICD-10-CM | POA: Diagnosis not present

## 2017-02-26 DIAGNOSIS — D509 Iron deficiency anemia, unspecified: Secondary | ICD-10-CM | POA: Diagnosis not present

## 2017-02-26 DIAGNOSIS — N186 End stage renal disease: Secondary | ICD-10-CM | POA: Diagnosis not present

## 2017-02-26 DIAGNOSIS — N2581 Secondary hyperparathyroidism of renal origin: Secondary | ICD-10-CM | POA: Diagnosis not present

## 2017-02-28 DIAGNOSIS — E119 Type 2 diabetes mellitus without complications: Secondary | ICD-10-CM | POA: Diagnosis not present

## 2017-02-28 DIAGNOSIS — D509 Iron deficiency anemia, unspecified: Secondary | ICD-10-CM | POA: Diagnosis not present

## 2017-02-28 DIAGNOSIS — N2581 Secondary hyperparathyroidism of renal origin: Secondary | ICD-10-CM | POA: Diagnosis not present

## 2017-02-28 DIAGNOSIS — N186 End stage renal disease: Secondary | ICD-10-CM | POA: Diagnosis not present

## 2017-03-03 DIAGNOSIS — D509 Iron deficiency anemia, unspecified: Secondary | ICD-10-CM | POA: Diagnosis not present

## 2017-03-03 DIAGNOSIS — E119 Type 2 diabetes mellitus without complications: Secondary | ICD-10-CM | POA: Diagnosis not present

## 2017-03-03 DIAGNOSIS — N2581 Secondary hyperparathyroidism of renal origin: Secondary | ICD-10-CM | POA: Diagnosis not present

## 2017-03-03 DIAGNOSIS — N186 End stage renal disease: Secondary | ICD-10-CM | POA: Diagnosis not present

## 2017-03-05 DIAGNOSIS — E119 Type 2 diabetes mellitus without complications: Secondary | ICD-10-CM | POA: Diagnosis not present

## 2017-03-05 DIAGNOSIS — N186 End stage renal disease: Secondary | ICD-10-CM | POA: Diagnosis not present

## 2017-03-05 DIAGNOSIS — D509 Iron deficiency anemia, unspecified: Secondary | ICD-10-CM | POA: Diagnosis not present

## 2017-03-05 DIAGNOSIS — E1129 Type 2 diabetes mellitus with other diabetic kidney complication: Secondary | ICD-10-CM | POA: Diagnosis not present

## 2017-03-05 DIAGNOSIS — N2581 Secondary hyperparathyroidism of renal origin: Secondary | ICD-10-CM | POA: Diagnosis not present

## 2017-03-07 DIAGNOSIS — N2581 Secondary hyperparathyroidism of renal origin: Secondary | ICD-10-CM | POA: Diagnosis not present

## 2017-03-07 DIAGNOSIS — E119 Type 2 diabetes mellitus without complications: Secondary | ICD-10-CM | POA: Diagnosis not present

## 2017-03-07 DIAGNOSIS — D509 Iron deficiency anemia, unspecified: Secondary | ICD-10-CM | POA: Diagnosis not present

## 2017-03-07 DIAGNOSIS — N186 End stage renal disease: Secondary | ICD-10-CM | POA: Diagnosis not present

## 2017-03-10 DIAGNOSIS — E119 Type 2 diabetes mellitus without complications: Secondary | ICD-10-CM | POA: Diagnosis not present

## 2017-03-10 DIAGNOSIS — N2581 Secondary hyperparathyroidism of renal origin: Secondary | ICD-10-CM | POA: Diagnosis not present

## 2017-03-10 DIAGNOSIS — N186 End stage renal disease: Secondary | ICD-10-CM | POA: Diagnosis not present

## 2017-03-10 DIAGNOSIS — D509 Iron deficiency anemia, unspecified: Secondary | ICD-10-CM | POA: Diagnosis not present

## 2017-03-12 DIAGNOSIS — N186 End stage renal disease: Secondary | ICD-10-CM | POA: Diagnosis not present

## 2017-03-12 DIAGNOSIS — D509 Iron deficiency anemia, unspecified: Secondary | ICD-10-CM | POA: Diagnosis not present

## 2017-03-12 DIAGNOSIS — E119 Type 2 diabetes mellitus without complications: Secondary | ICD-10-CM | POA: Diagnosis not present

## 2017-03-12 DIAGNOSIS — N2581 Secondary hyperparathyroidism of renal origin: Secondary | ICD-10-CM | POA: Diagnosis not present

## 2017-03-14 DIAGNOSIS — N2581 Secondary hyperparathyroidism of renal origin: Secondary | ICD-10-CM | POA: Diagnosis not present

## 2017-03-14 DIAGNOSIS — E119 Type 2 diabetes mellitus without complications: Secondary | ICD-10-CM | POA: Diagnosis not present

## 2017-03-14 DIAGNOSIS — N186 End stage renal disease: Secondary | ICD-10-CM | POA: Diagnosis not present

## 2017-03-14 DIAGNOSIS — D509 Iron deficiency anemia, unspecified: Secondary | ICD-10-CM | POA: Diagnosis not present

## 2017-03-17 DIAGNOSIS — E119 Type 2 diabetes mellitus without complications: Secondary | ICD-10-CM | POA: Diagnosis not present

## 2017-03-17 DIAGNOSIS — N2581 Secondary hyperparathyroidism of renal origin: Secondary | ICD-10-CM | POA: Diagnosis not present

## 2017-03-17 DIAGNOSIS — N186 End stage renal disease: Secondary | ICD-10-CM | POA: Diagnosis not present

## 2017-03-17 DIAGNOSIS — D509 Iron deficiency anemia, unspecified: Secondary | ICD-10-CM | POA: Diagnosis not present

## 2017-03-19 DIAGNOSIS — N186 End stage renal disease: Secondary | ICD-10-CM | POA: Diagnosis not present

## 2017-03-19 DIAGNOSIS — E119 Type 2 diabetes mellitus without complications: Secondary | ICD-10-CM | POA: Diagnosis not present

## 2017-03-19 DIAGNOSIS — D509 Iron deficiency anemia, unspecified: Secondary | ICD-10-CM | POA: Diagnosis not present

## 2017-03-19 DIAGNOSIS — N2581 Secondary hyperparathyroidism of renal origin: Secondary | ICD-10-CM | POA: Diagnosis not present

## 2017-03-21 DIAGNOSIS — N2581 Secondary hyperparathyroidism of renal origin: Secondary | ICD-10-CM | POA: Diagnosis not present

## 2017-03-21 DIAGNOSIS — D509 Iron deficiency anemia, unspecified: Secondary | ICD-10-CM | POA: Diagnosis not present

## 2017-03-21 DIAGNOSIS — E119 Type 2 diabetes mellitus without complications: Secondary | ICD-10-CM | POA: Diagnosis not present

## 2017-03-21 DIAGNOSIS — N186 End stage renal disease: Secondary | ICD-10-CM | POA: Diagnosis not present

## 2017-03-25 DIAGNOSIS — I120 Hypertensive chronic kidney disease with stage 5 chronic kidney disease or end stage renal disease: Secondary | ICD-10-CM | POA: Diagnosis not present

## 2017-03-25 DIAGNOSIS — N186 End stage renal disease: Secondary | ICD-10-CM | POA: Diagnosis not present

## 2017-03-25 DIAGNOSIS — Z992 Dependence on renal dialysis: Secondary | ICD-10-CM | POA: Diagnosis not present

## 2017-03-26 DIAGNOSIS — N2581 Secondary hyperparathyroidism of renal origin: Secondary | ICD-10-CM | POA: Diagnosis not present

## 2017-03-26 DIAGNOSIS — N186 End stage renal disease: Secondary | ICD-10-CM | POA: Diagnosis not present

## 2017-03-26 DIAGNOSIS — D509 Iron deficiency anemia, unspecified: Secondary | ICD-10-CM | POA: Diagnosis not present

## 2017-03-26 DIAGNOSIS — D631 Anemia in chronic kidney disease: Secondary | ICD-10-CM | POA: Diagnosis not present

## 2017-03-26 DIAGNOSIS — E119 Type 2 diabetes mellitus without complications: Secondary | ICD-10-CM | POA: Diagnosis not present

## 2017-03-28 DIAGNOSIS — N186 End stage renal disease: Secondary | ICD-10-CM | POA: Diagnosis not present

## 2017-03-28 DIAGNOSIS — D631 Anemia in chronic kidney disease: Secondary | ICD-10-CM | POA: Diagnosis not present

## 2017-03-28 DIAGNOSIS — E119 Type 2 diabetes mellitus without complications: Secondary | ICD-10-CM | POA: Diagnosis not present

## 2017-03-28 DIAGNOSIS — D509 Iron deficiency anemia, unspecified: Secondary | ICD-10-CM | POA: Diagnosis not present

## 2017-03-28 DIAGNOSIS — N2581 Secondary hyperparathyroidism of renal origin: Secondary | ICD-10-CM | POA: Diagnosis not present

## 2017-03-31 DIAGNOSIS — D509 Iron deficiency anemia, unspecified: Secondary | ICD-10-CM | POA: Diagnosis not present

## 2017-03-31 DIAGNOSIS — D631 Anemia in chronic kidney disease: Secondary | ICD-10-CM | POA: Diagnosis not present

## 2017-03-31 DIAGNOSIS — E119 Type 2 diabetes mellitus without complications: Secondary | ICD-10-CM | POA: Diagnosis not present

## 2017-03-31 DIAGNOSIS — N186 End stage renal disease: Secondary | ICD-10-CM | POA: Diagnosis not present

## 2017-03-31 DIAGNOSIS — N2581 Secondary hyperparathyroidism of renal origin: Secondary | ICD-10-CM | POA: Diagnosis not present

## 2017-04-02 DIAGNOSIS — N2581 Secondary hyperparathyroidism of renal origin: Secondary | ICD-10-CM | POA: Diagnosis not present

## 2017-04-02 DIAGNOSIS — E119 Type 2 diabetes mellitus without complications: Secondary | ICD-10-CM | POA: Diagnosis not present

## 2017-04-02 DIAGNOSIS — D509 Iron deficiency anemia, unspecified: Secondary | ICD-10-CM | POA: Diagnosis not present

## 2017-04-02 DIAGNOSIS — D631 Anemia in chronic kidney disease: Secondary | ICD-10-CM | POA: Diagnosis not present

## 2017-04-02 DIAGNOSIS — N186 End stage renal disease: Secondary | ICD-10-CM | POA: Diagnosis not present

## 2017-04-04 DIAGNOSIS — N2581 Secondary hyperparathyroidism of renal origin: Secondary | ICD-10-CM | POA: Diagnosis not present

## 2017-04-04 DIAGNOSIS — E119 Type 2 diabetes mellitus without complications: Secondary | ICD-10-CM | POA: Diagnosis not present

## 2017-04-04 DIAGNOSIS — D509 Iron deficiency anemia, unspecified: Secondary | ICD-10-CM | POA: Diagnosis not present

## 2017-04-04 DIAGNOSIS — D631 Anemia in chronic kidney disease: Secondary | ICD-10-CM | POA: Diagnosis not present

## 2017-04-04 DIAGNOSIS — N186 End stage renal disease: Secondary | ICD-10-CM | POA: Diagnosis not present

## 2017-04-07 DIAGNOSIS — E119 Type 2 diabetes mellitus without complications: Secondary | ICD-10-CM | POA: Diagnosis not present

## 2017-04-07 DIAGNOSIS — D631 Anemia in chronic kidney disease: Secondary | ICD-10-CM | POA: Diagnosis not present

## 2017-04-07 DIAGNOSIS — N186 End stage renal disease: Secondary | ICD-10-CM | POA: Diagnosis not present

## 2017-04-07 DIAGNOSIS — D509 Iron deficiency anemia, unspecified: Secondary | ICD-10-CM | POA: Diagnosis not present

## 2017-04-07 DIAGNOSIS — N2581 Secondary hyperparathyroidism of renal origin: Secondary | ICD-10-CM | POA: Diagnosis not present

## 2017-04-09 DIAGNOSIS — D631 Anemia in chronic kidney disease: Secondary | ICD-10-CM | POA: Diagnosis not present

## 2017-04-09 DIAGNOSIS — N186 End stage renal disease: Secondary | ICD-10-CM | POA: Diagnosis not present

## 2017-04-09 DIAGNOSIS — N2581 Secondary hyperparathyroidism of renal origin: Secondary | ICD-10-CM | POA: Diagnosis not present

## 2017-04-09 DIAGNOSIS — E119 Type 2 diabetes mellitus without complications: Secondary | ICD-10-CM | POA: Diagnosis not present

## 2017-04-09 DIAGNOSIS — D509 Iron deficiency anemia, unspecified: Secondary | ICD-10-CM | POA: Diagnosis not present

## 2017-04-11 DIAGNOSIS — E119 Type 2 diabetes mellitus without complications: Secondary | ICD-10-CM | POA: Diagnosis not present

## 2017-04-11 DIAGNOSIS — N186 End stage renal disease: Secondary | ICD-10-CM | POA: Diagnosis not present

## 2017-04-11 DIAGNOSIS — N2581 Secondary hyperparathyroidism of renal origin: Secondary | ICD-10-CM | POA: Diagnosis not present

## 2017-04-11 DIAGNOSIS — D509 Iron deficiency anemia, unspecified: Secondary | ICD-10-CM | POA: Diagnosis not present

## 2017-04-11 DIAGNOSIS — D631 Anemia in chronic kidney disease: Secondary | ICD-10-CM | POA: Diagnosis not present

## 2017-04-13 DIAGNOSIS — D509 Iron deficiency anemia, unspecified: Secondary | ICD-10-CM | POA: Diagnosis not present

## 2017-04-13 DIAGNOSIS — N186 End stage renal disease: Secondary | ICD-10-CM | POA: Diagnosis not present

## 2017-04-13 DIAGNOSIS — N2581 Secondary hyperparathyroidism of renal origin: Secondary | ICD-10-CM | POA: Diagnosis not present

## 2017-04-13 DIAGNOSIS — D631 Anemia in chronic kidney disease: Secondary | ICD-10-CM | POA: Diagnosis not present

## 2017-04-13 DIAGNOSIS — E119 Type 2 diabetes mellitus without complications: Secondary | ICD-10-CM | POA: Diagnosis not present

## 2017-04-15 DIAGNOSIS — D631 Anemia in chronic kidney disease: Secondary | ICD-10-CM | POA: Diagnosis not present

## 2017-04-15 DIAGNOSIS — E119 Type 2 diabetes mellitus without complications: Secondary | ICD-10-CM | POA: Diagnosis not present

## 2017-04-15 DIAGNOSIS — D509 Iron deficiency anemia, unspecified: Secondary | ICD-10-CM | POA: Diagnosis not present

## 2017-04-15 DIAGNOSIS — N2581 Secondary hyperparathyroidism of renal origin: Secondary | ICD-10-CM | POA: Diagnosis not present

## 2017-04-15 DIAGNOSIS — N186 End stage renal disease: Secondary | ICD-10-CM | POA: Diagnosis not present

## 2017-04-18 DIAGNOSIS — D631 Anemia in chronic kidney disease: Secondary | ICD-10-CM | POA: Diagnosis not present

## 2017-04-18 DIAGNOSIS — D509 Iron deficiency anemia, unspecified: Secondary | ICD-10-CM | POA: Diagnosis not present

## 2017-04-18 DIAGNOSIS — N186 End stage renal disease: Secondary | ICD-10-CM | POA: Diagnosis not present

## 2017-04-18 DIAGNOSIS — E119 Type 2 diabetes mellitus without complications: Secondary | ICD-10-CM | POA: Diagnosis not present

## 2017-04-18 DIAGNOSIS — N2581 Secondary hyperparathyroidism of renal origin: Secondary | ICD-10-CM | POA: Diagnosis not present

## 2017-04-21 DIAGNOSIS — E119 Type 2 diabetes mellitus without complications: Secondary | ICD-10-CM | POA: Diagnosis not present

## 2017-04-21 DIAGNOSIS — D631 Anemia in chronic kidney disease: Secondary | ICD-10-CM | POA: Diagnosis not present

## 2017-04-21 DIAGNOSIS — N2581 Secondary hyperparathyroidism of renal origin: Secondary | ICD-10-CM | POA: Diagnosis not present

## 2017-04-21 DIAGNOSIS — N186 End stage renal disease: Secondary | ICD-10-CM | POA: Diagnosis not present

## 2017-04-21 DIAGNOSIS — D509 Iron deficiency anemia, unspecified: Secondary | ICD-10-CM | POA: Diagnosis not present

## 2017-04-23 DIAGNOSIS — N2581 Secondary hyperparathyroidism of renal origin: Secondary | ICD-10-CM | POA: Diagnosis not present

## 2017-04-23 DIAGNOSIS — N186 End stage renal disease: Secondary | ICD-10-CM | POA: Diagnosis not present

## 2017-04-23 DIAGNOSIS — D509 Iron deficiency anemia, unspecified: Secondary | ICD-10-CM | POA: Diagnosis not present

## 2017-04-23 DIAGNOSIS — E119 Type 2 diabetes mellitus without complications: Secondary | ICD-10-CM | POA: Diagnosis not present

## 2017-04-23 DIAGNOSIS — D631 Anemia in chronic kidney disease: Secondary | ICD-10-CM | POA: Diagnosis not present

## 2017-04-24 DIAGNOSIS — N186 End stage renal disease: Secondary | ICD-10-CM | POA: Diagnosis not present

## 2017-04-24 DIAGNOSIS — I120 Hypertensive chronic kidney disease with stage 5 chronic kidney disease or end stage renal disease: Secondary | ICD-10-CM | POA: Diagnosis not present

## 2017-04-24 DIAGNOSIS — Z992 Dependence on renal dialysis: Secondary | ICD-10-CM | POA: Diagnosis not present

## 2017-04-25 DIAGNOSIS — D509 Iron deficiency anemia, unspecified: Secondary | ICD-10-CM | POA: Diagnosis not present

## 2017-04-25 DIAGNOSIS — N186 End stage renal disease: Secondary | ICD-10-CM | POA: Diagnosis not present

## 2017-04-25 DIAGNOSIS — N2581 Secondary hyperparathyroidism of renal origin: Secondary | ICD-10-CM | POA: Diagnosis not present

## 2017-04-25 DIAGNOSIS — E119 Type 2 diabetes mellitus without complications: Secondary | ICD-10-CM | POA: Diagnosis not present

## 2017-04-28 DIAGNOSIS — D509 Iron deficiency anemia, unspecified: Secondary | ICD-10-CM | POA: Diagnosis not present

## 2017-04-28 DIAGNOSIS — N2581 Secondary hyperparathyroidism of renal origin: Secondary | ICD-10-CM | POA: Diagnosis not present

## 2017-04-28 DIAGNOSIS — N186 End stage renal disease: Secondary | ICD-10-CM | POA: Diagnosis not present

## 2017-04-28 DIAGNOSIS — E119 Type 2 diabetes mellitus without complications: Secondary | ICD-10-CM | POA: Diagnosis not present

## 2017-05-02 DIAGNOSIS — D509 Iron deficiency anemia, unspecified: Secondary | ICD-10-CM | POA: Diagnosis not present

## 2017-05-02 DIAGNOSIS — N2581 Secondary hyperparathyroidism of renal origin: Secondary | ICD-10-CM | POA: Diagnosis not present

## 2017-05-02 DIAGNOSIS — N186 End stage renal disease: Secondary | ICD-10-CM | POA: Diagnosis not present

## 2017-05-02 DIAGNOSIS — E119 Type 2 diabetes mellitus without complications: Secondary | ICD-10-CM | POA: Diagnosis not present

## 2017-05-05 DIAGNOSIS — D509 Iron deficiency anemia, unspecified: Secondary | ICD-10-CM | POA: Diagnosis not present

## 2017-05-05 DIAGNOSIS — N186 End stage renal disease: Secondary | ICD-10-CM | POA: Diagnosis not present

## 2017-05-05 DIAGNOSIS — E119 Type 2 diabetes mellitus without complications: Secondary | ICD-10-CM | POA: Diagnosis not present

## 2017-05-05 DIAGNOSIS — N2581 Secondary hyperparathyroidism of renal origin: Secondary | ICD-10-CM | POA: Diagnosis not present

## 2017-05-09 DIAGNOSIS — D509 Iron deficiency anemia, unspecified: Secondary | ICD-10-CM | POA: Diagnosis not present

## 2017-05-09 DIAGNOSIS — N186 End stage renal disease: Secondary | ICD-10-CM | POA: Diagnosis not present

## 2017-05-09 DIAGNOSIS — N2581 Secondary hyperparathyroidism of renal origin: Secondary | ICD-10-CM | POA: Diagnosis not present

## 2017-05-09 DIAGNOSIS — E119 Type 2 diabetes mellitus without complications: Secondary | ICD-10-CM | POA: Diagnosis not present

## 2017-05-12 DIAGNOSIS — D509 Iron deficiency anemia, unspecified: Secondary | ICD-10-CM | POA: Diagnosis not present

## 2017-05-12 DIAGNOSIS — N2581 Secondary hyperparathyroidism of renal origin: Secondary | ICD-10-CM | POA: Diagnosis not present

## 2017-05-12 DIAGNOSIS — E119 Type 2 diabetes mellitus without complications: Secondary | ICD-10-CM | POA: Diagnosis not present

## 2017-05-12 DIAGNOSIS — N186 End stage renal disease: Secondary | ICD-10-CM | POA: Diagnosis not present

## 2017-05-14 DIAGNOSIS — N2581 Secondary hyperparathyroidism of renal origin: Secondary | ICD-10-CM | POA: Diagnosis not present

## 2017-05-14 DIAGNOSIS — E119 Type 2 diabetes mellitus without complications: Secondary | ICD-10-CM | POA: Diagnosis not present

## 2017-05-14 DIAGNOSIS — N186 End stage renal disease: Secondary | ICD-10-CM | POA: Diagnosis not present

## 2017-05-14 DIAGNOSIS — D509 Iron deficiency anemia, unspecified: Secondary | ICD-10-CM | POA: Diagnosis not present

## 2017-05-16 DIAGNOSIS — E119 Type 2 diabetes mellitus without complications: Secondary | ICD-10-CM | POA: Diagnosis not present

## 2017-05-16 DIAGNOSIS — D509 Iron deficiency anemia, unspecified: Secondary | ICD-10-CM | POA: Diagnosis not present

## 2017-05-16 DIAGNOSIS — N186 End stage renal disease: Secondary | ICD-10-CM | POA: Diagnosis not present

## 2017-05-16 DIAGNOSIS — N2581 Secondary hyperparathyroidism of renal origin: Secondary | ICD-10-CM | POA: Diagnosis not present

## 2017-05-18 DIAGNOSIS — N2581 Secondary hyperparathyroidism of renal origin: Secondary | ICD-10-CM | POA: Diagnosis not present

## 2017-05-18 DIAGNOSIS — D509 Iron deficiency anemia, unspecified: Secondary | ICD-10-CM | POA: Diagnosis not present

## 2017-05-18 DIAGNOSIS — N186 End stage renal disease: Secondary | ICD-10-CM | POA: Diagnosis not present

## 2017-05-18 DIAGNOSIS — E119 Type 2 diabetes mellitus without complications: Secondary | ICD-10-CM | POA: Diagnosis not present

## 2017-05-21 DIAGNOSIS — N2581 Secondary hyperparathyroidism of renal origin: Secondary | ICD-10-CM | POA: Diagnosis not present

## 2017-05-21 DIAGNOSIS — N186 End stage renal disease: Secondary | ICD-10-CM | POA: Diagnosis not present

## 2017-05-21 DIAGNOSIS — D509 Iron deficiency anemia, unspecified: Secondary | ICD-10-CM | POA: Diagnosis not present

## 2017-05-21 DIAGNOSIS — E119 Type 2 diabetes mellitus without complications: Secondary | ICD-10-CM | POA: Diagnosis not present

## 2017-05-23 DIAGNOSIS — N2581 Secondary hyperparathyroidism of renal origin: Secondary | ICD-10-CM | POA: Diagnosis not present

## 2017-05-23 DIAGNOSIS — D509 Iron deficiency anemia, unspecified: Secondary | ICD-10-CM | POA: Diagnosis not present

## 2017-05-23 DIAGNOSIS — E119 Type 2 diabetes mellitus without complications: Secondary | ICD-10-CM | POA: Diagnosis not present

## 2017-05-23 DIAGNOSIS — N186 End stage renal disease: Secondary | ICD-10-CM | POA: Diagnosis not present

## 2017-05-25 DIAGNOSIS — Z992 Dependence on renal dialysis: Secondary | ICD-10-CM | POA: Diagnosis not present

## 2017-05-25 DIAGNOSIS — N186 End stage renal disease: Secondary | ICD-10-CM | POA: Diagnosis not present

## 2017-05-25 DIAGNOSIS — I120 Hypertensive chronic kidney disease with stage 5 chronic kidney disease or end stage renal disease: Secondary | ICD-10-CM | POA: Diagnosis not present

## 2017-05-28 DIAGNOSIS — N186 End stage renal disease: Secondary | ICD-10-CM | POA: Diagnosis not present

## 2017-05-28 DIAGNOSIS — N2581 Secondary hyperparathyroidism of renal origin: Secondary | ICD-10-CM | POA: Diagnosis not present

## 2017-05-28 DIAGNOSIS — E119 Type 2 diabetes mellitus without complications: Secondary | ICD-10-CM | POA: Diagnosis not present

## 2017-05-28 DIAGNOSIS — D509 Iron deficiency anemia, unspecified: Secondary | ICD-10-CM | POA: Diagnosis not present

## 2017-05-28 DIAGNOSIS — D631 Anemia in chronic kidney disease: Secondary | ICD-10-CM | POA: Diagnosis not present

## 2017-05-30 DIAGNOSIS — N186 End stage renal disease: Secondary | ICD-10-CM | POA: Diagnosis not present

## 2017-05-30 DIAGNOSIS — N2581 Secondary hyperparathyroidism of renal origin: Secondary | ICD-10-CM | POA: Diagnosis not present

## 2017-05-30 DIAGNOSIS — E119 Type 2 diabetes mellitus without complications: Secondary | ICD-10-CM | POA: Diagnosis not present

## 2017-05-30 DIAGNOSIS — D631 Anemia in chronic kidney disease: Secondary | ICD-10-CM | POA: Diagnosis not present

## 2017-05-30 DIAGNOSIS — D509 Iron deficiency anemia, unspecified: Secondary | ICD-10-CM | POA: Diagnosis not present

## 2017-06-02 DIAGNOSIS — N186 End stage renal disease: Secondary | ICD-10-CM | POA: Diagnosis not present

## 2017-06-02 DIAGNOSIS — D509 Iron deficiency anemia, unspecified: Secondary | ICD-10-CM | POA: Diagnosis not present

## 2017-06-02 DIAGNOSIS — E119 Type 2 diabetes mellitus without complications: Secondary | ICD-10-CM | POA: Diagnosis not present

## 2017-06-02 DIAGNOSIS — D631 Anemia in chronic kidney disease: Secondary | ICD-10-CM | POA: Diagnosis not present

## 2017-06-02 DIAGNOSIS — N2581 Secondary hyperparathyroidism of renal origin: Secondary | ICD-10-CM | POA: Diagnosis not present

## 2017-06-04 DIAGNOSIS — E119 Type 2 diabetes mellitus without complications: Secondary | ICD-10-CM | POA: Diagnosis not present

## 2017-06-04 DIAGNOSIS — N186 End stage renal disease: Secondary | ICD-10-CM | POA: Diagnosis not present

## 2017-06-04 DIAGNOSIS — E1129 Type 2 diabetes mellitus with other diabetic kidney complication: Secondary | ICD-10-CM | POA: Diagnosis not present

## 2017-06-04 DIAGNOSIS — D509 Iron deficiency anemia, unspecified: Secondary | ICD-10-CM | POA: Diagnosis not present

## 2017-06-04 DIAGNOSIS — N2581 Secondary hyperparathyroidism of renal origin: Secondary | ICD-10-CM | POA: Diagnosis not present

## 2017-06-04 DIAGNOSIS — D631 Anemia in chronic kidney disease: Secondary | ICD-10-CM | POA: Diagnosis not present

## 2017-06-06 DIAGNOSIS — N186 End stage renal disease: Secondary | ICD-10-CM | POA: Diagnosis not present

## 2017-06-06 DIAGNOSIS — E119 Type 2 diabetes mellitus without complications: Secondary | ICD-10-CM | POA: Diagnosis not present

## 2017-06-06 DIAGNOSIS — D509 Iron deficiency anemia, unspecified: Secondary | ICD-10-CM | POA: Diagnosis not present

## 2017-06-06 DIAGNOSIS — D631 Anemia in chronic kidney disease: Secondary | ICD-10-CM | POA: Diagnosis not present

## 2017-06-06 DIAGNOSIS — N2581 Secondary hyperparathyroidism of renal origin: Secondary | ICD-10-CM | POA: Diagnosis not present

## 2017-06-09 DIAGNOSIS — N2581 Secondary hyperparathyroidism of renal origin: Secondary | ICD-10-CM | POA: Diagnosis not present

## 2017-06-09 DIAGNOSIS — N186 End stage renal disease: Secondary | ICD-10-CM | POA: Diagnosis not present

## 2017-06-09 DIAGNOSIS — D631 Anemia in chronic kidney disease: Secondary | ICD-10-CM | POA: Diagnosis not present

## 2017-06-09 DIAGNOSIS — D509 Iron deficiency anemia, unspecified: Secondary | ICD-10-CM | POA: Diagnosis not present

## 2017-06-09 DIAGNOSIS — E119 Type 2 diabetes mellitus without complications: Secondary | ICD-10-CM | POA: Diagnosis not present

## 2017-06-10 ENCOUNTER — Encounter: Payer: Self-pay | Admitting: Vascular Surgery

## 2017-06-10 ENCOUNTER — Ambulatory Visit (INDEPENDENT_AMBULATORY_CARE_PROVIDER_SITE_OTHER): Payer: Medicare Other | Admitting: Vascular Surgery

## 2017-06-10 ENCOUNTER — Other Ambulatory Visit: Payer: Self-pay | Admitting: *Deleted

## 2017-06-10 ENCOUNTER — Encounter: Payer: Self-pay | Admitting: *Deleted

## 2017-06-10 VITALS — BP 205/123 | HR 108 | Temp 98.5°F | Resp 20 | Ht 71.5 in | Wt 275.0 lb

## 2017-06-10 DIAGNOSIS — N186 End stage renal disease: Secondary | ICD-10-CM

## 2017-06-10 DIAGNOSIS — Z992 Dependence on renal dialysis: Secondary | ICD-10-CM | POA: Diagnosis not present

## 2017-06-10 NOTE — Progress Notes (Signed)
Patient name: Joshua [name] MRN: [id number] DOB: [birth year] Sex: male  REASON FOR VISIT:   To evaluate for plication of large aneurysms of left AV fistula the consult is requested by Dr. Jimmy Footman.  HPI:   Joshua [name] is a pleasant 37 y.o. male who is had a left brachiocephalic fistula for 4-5 years.  He has developed 2 very large aneurysms and is sent for evaluation for plication.  He dialyzes on Tuesdays Thursdays and Saturdays.  He tells me that the fistula has been working well.  He denies any uremic symptoms.  Specifically, he denies nausea, vomiting, fatigue, anorexia, or palpitations.  His blood pressure was high today but he had not taken his medications this morning.  Past Medical History:  Diagnosis Date  . Anemia of chronic disease   . Arthritis   . Chronic combined systolic and diastolic heart failure ([place])    a.  Echo (12/2012):  Somewhat bright myocardium, moderate to severe LVH, EF 30%, diffuse HK, diastolic dysfunction, mild AI, moderate to severe LAE, moderate to severe RV, moderate to severe reduced RVSF, mild RAE, PASP 38 mm Hg.  . ESRD (end stage renal disease) ([place])    hemodialysis Tues, Thurs, Sat  . Headache(784.0)    Regular  . Hx of cardiac catheterization    a. Nuclear (04/2013):  Mild inferolateral ischemia, EF 46%;  b. R/L HC (04/2013):  No CAD, RA 5, RV 44/3/8, PA 43/16 (mean 28), PCWP 15  . Hypertensive cardiovascular disease    Renal Artery [place] (09/2012): No evidence of renal artery stenosis  . NICM (nonischemic cardiomyopathy) ([place])   . Sleep apnea   . Type II diabetes mellitus (HCC)     Family History  Problem Relation Age of Onset  . Diabetes Father   . Hypertension Father   . Hypertension Maternal Grandmother   . Hypertension Maternal Grandfather   . Kidney disease Maternal Grandfather   . Diabetes Maternal Grandfather     SOCIAL HISTORY: Social History   Tobacco Use  . Smoking status: Former Smoker    Years: 0.10    Types:  Cigarettes    Last attempt to quit: 09/24/1999    Years since quitting: 17.7  . Smokeless tobacco: Never Used  Substance Use Topics  . Alcohol use: No    Allergies  Allergen Reactions  . Isosorbide Dinitrate     Severe headaches with po nitrates (imdur, isordil), do not prescribe  . Nitrates, Organic     Severe headaches with po nitrates (imdur, isordil, etc), do not prescribe    Current Outpatient Medications  Medication Sig Dispense Refill  . aspirin 325 MG tablet Take 325 mg by mouth daily.    . calcium acetate (PHOSLO) 667 MG capsule Take by mouth.    . carvedilol (COREG) 12.5 MG tablet Take 25 mg by mouth 2 (two) times daily with a meal. Take two tablets twice daily with a meal    . glimepiride (AMARYL) 4 MG tablet     . JANUVIA 100 MG tablet     . Multiple Vitamin (MULTIVITAMIN WITH MINERALS) TABS tablet Take 1 tablet by mouth daily.    . hydrALAZINE (APRESOLINE) 25 MG tablet Take 1 tablet (25 mg total) by mouth 3 (three) times daily. (Patient not taking: Reported on 06/10/2017) 90 tablet 11  . minoxidil (LONITEN) 2.5 MG tablet Take 2 tablets (5 mg total) by mouth 2 (two) times daily. (Patient not taking: Reported on 06/10/2017) 120 tablet 0   No current  facility-administered medications for this visit.     REVIEW OF SYSTEMS:  [X]  denotes positive finding, [ ]  denotes negative finding Cardiac  Comments:  Chest pain or chest pressure:    Shortness of breath upon exertion:    Short of breath when lying flat:    Irregular heart rhythm:        Vascular    Pain in calf, thigh, or hip brought on by ambulation:    Pain in feet at night that wakes you up from your sleep:     Blood clot in your veins:    Leg swelling:         Pulmonary    Oxygen at home:    Productive cough:     Wheezing:         Neurologic    Sudden weakness in arms or legs:     Sudden numbness in arms or legs:     Sudden onset of difficulty speaking or slurred speech:    Temporary loss of vision in one  eye:     Problems with dizziness:         Gastrointestinal    Blood in stool:     Vomited blood:         Genitourinary    Burning when urinating:     Blood in urine:        Psychiatric    Major depression:         Hematologic    Bleeding problems:    Problems with blood clotting too easily:        Skin    Rashes or ulcers:        Constitutional    Fever or chills:     PHYSICAL EXAM:   Vitals:   06/10/17 0924 06/10/17 0926  BP: (!) 189/124 (!) 205/123  Pulse: (!) 108   Resp: 20   Temp: 98.5 F (36.9 C)   TempSrc: Oral   SpO2: 98%   Weight: 275 lb (124.7 kg)   Height: 5' 11.5" (1.816 m)     GENERAL: The patient is a well-nourished male, in no acute distress. The vital signs are documented above. CARDIAC: There is a regular rate and rhythm.  VASCULAR: He has palpable radial pulses. He has 2 very large aneurysms in his left upper arm fistula.  The one adjacent to the antecubital level is the larger of the 2.  The fistula has a bruit and a thrill. PULMONARY: There is good air exchange bilaterally without wheezing or rales. MUSCULOSKELETAL: There are no major deformities or cyanosis. NEUROLOGIC: No focal weakness or paresthesias are detected. SKIN: There are no ulcers or rashes noted. PSYCHIATRIC: The patient has a normal affect.  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  I explained that the options are to try to plicate these in a staged fashion versus abandoning this fistula and working on new access.  He would like to try to salvage this graft if at all possible I think this is reasonable.  However, the aneurysms are quite large and this will have to be done in a staged fashion and will be technically challenging given the size of the aneurysms.  I recommended that we address the larger aneurysm adjacent to the antecubital level first so that they  will still have room to cannulate his fistula further  centrally.  Once the new area is ready for cannulation we could address the second large aneurysm.  This has been scheduled for 06/19/2017.  I have reviewed the procedure and the potential complications and he is agreeable to proceed.  HYPERTENSION: The patient's initial blood pressure today was elevated. We repeated this and this was still elevated. We have encouraged the patient to follow up with their primary care physician for management of their blood pressure.   [name] [facility] [phone]

## 2017-06-11 DIAGNOSIS — N186 End stage renal disease: Secondary | ICD-10-CM | POA: Diagnosis not present

## 2017-06-11 DIAGNOSIS — N2581 Secondary hyperparathyroidism of renal origin: Secondary | ICD-10-CM | POA: Diagnosis not present

## 2017-06-11 DIAGNOSIS — D631 Anemia in chronic kidney disease: Secondary | ICD-10-CM | POA: Diagnosis not present

## 2017-06-11 DIAGNOSIS — E119 Type 2 diabetes mellitus without complications: Secondary | ICD-10-CM | POA: Diagnosis not present

## 2017-06-11 DIAGNOSIS — D509 Iron deficiency anemia, unspecified: Secondary | ICD-10-CM | POA: Diagnosis not present

## 2017-06-13 DIAGNOSIS — N2581 Secondary hyperparathyroidism of renal origin: Secondary | ICD-10-CM | POA: Diagnosis not present

## 2017-06-13 DIAGNOSIS — N186 End stage renal disease: Secondary | ICD-10-CM | POA: Diagnosis not present

## 2017-06-13 DIAGNOSIS — E119 Type 2 diabetes mellitus without complications: Secondary | ICD-10-CM | POA: Diagnosis not present

## 2017-06-13 DIAGNOSIS — D509 Iron deficiency anemia, unspecified: Secondary | ICD-10-CM | POA: Diagnosis not present

## 2017-06-13 DIAGNOSIS — D631 Anemia in chronic kidney disease: Secondary | ICD-10-CM | POA: Diagnosis not present

## 2017-06-16 DIAGNOSIS — D631 Anemia in chronic kidney disease: Secondary | ICD-10-CM | POA: Diagnosis not present

## 2017-06-16 DIAGNOSIS — N2581 Secondary hyperparathyroidism of renal origin: Secondary | ICD-10-CM | POA: Diagnosis not present

## 2017-06-16 DIAGNOSIS — D509 Iron deficiency anemia, unspecified: Secondary | ICD-10-CM | POA: Diagnosis not present

## 2017-06-16 DIAGNOSIS — E119 Type 2 diabetes mellitus without complications: Secondary | ICD-10-CM | POA: Diagnosis not present

## 2017-06-16 DIAGNOSIS — N186 End stage renal disease: Secondary | ICD-10-CM | POA: Diagnosis not present

## 2017-06-18 ENCOUNTER — Encounter (HOSPITAL_COMMUNITY): Payer: Self-pay | Admitting: Emergency Medicine

## 2017-06-18 ENCOUNTER — Other Ambulatory Visit: Payer: Self-pay

## 2017-06-18 ENCOUNTER — Telehealth: Payer: Self-pay | Admitting: *Deleted

## 2017-06-18 ENCOUNTER — Encounter (HOSPITAL_COMMUNITY): Payer: Self-pay | Admitting: *Deleted

## 2017-06-18 DIAGNOSIS — N186 End stage renal disease: Secondary | ICD-10-CM | POA: Diagnosis not present

## 2017-06-18 DIAGNOSIS — E119 Type 2 diabetes mellitus without complications: Secondary | ICD-10-CM | POA: Diagnosis not present

## 2017-06-18 DIAGNOSIS — N2581 Secondary hyperparathyroidism of renal origin: Secondary | ICD-10-CM | POA: Diagnosis not present

## 2017-06-18 DIAGNOSIS — D509 Iron deficiency anemia, unspecified: Secondary | ICD-10-CM | POA: Diagnosis not present

## 2017-06-18 DIAGNOSIS — D631 Anemia in chronic kidney disease: Secondary | ICD-10-CM | POA: Diagnosis not present

## 2017-06-18 NOTE — Telephone Encounter (Signed)
Notification faxed via Epic to requesting office.  Sent to scheduling to arrange f/u.

## 2017-06-18 NOTE — Telephone Encounter (Signed)
   [facility] Pre-operative Risk Assessment    Request for surgical clearance:  1. What type of surgery is being performed? Resection of AVF fistula aneurysm - left arm   2. When is this surgery scheduled? TBS   3. What type of clearance is required (medical clearance vs. Pharmacy clearance to hold med vs. Both)? BOTH  4. Are there any medications that need to be held prior to surgery and how long? Not specified    5. Practice name and name of physician performing surgery? Dr. Gae Gallop @ [facility]   6. What is your office phone and fax number? (p) [phone]  (f) [phone] (surgical nurse - Zigmund Daniel)  7. Anesthesia type (None, local, MAC, general) ? Not specified    Fidel Levy 06/18/2017, 4:37 PM  _________________________________________________________________   (provider comments below)

## 2017-06-18 NOTE — Progress Notes (Signed)
I spoke with Lattie Haw at [place], patient is due for dialysis around 11.  I confirmed the phone number I have for the patient.  I asked for recent records- labs and studies- EKG- that might have been sent from another facility, H/P.

## 2017-06-18 NOTE — Telephone Encounter (Signed)
   Primary Cardiologist:Dr McAlhaney  Chart reviewed as part of pre-operative protocol coverage. Because of Joshua PENSON Jr.'s past medical history and time since last visit, he/she will require a follow-up visit in order to better assess preoperative cardiovascular risk.  Pre-op covering staff: - Please schedule appointment and call patient to inform them. - Please contact requesting surgeon's office via preferred method (i.e, phone, fax) to inform them of need for appointment prior to surgery.  Kerin Ransom, PA-C  06/18/2017, 4:58 PM

## 2017-06-18 NOTE — Progress Notes (Deleted)
I spoke with Joshua [name] at [place], patient is due for dialysis around 11.  I confirmed the phone number I have for the patient.  I asked for recent records- labs and studies- EKG- that might have been sent from another facility, H/P

## 2017-06-18 NOTE — Progress Notes (Signed)
Anesthesia Chart Review:  Pt is a same day work up   Pt is a 37 year old male scheduled for resection of L arm AV fistula aneurysm on 06/19/2017 with [name], MD  - Used to see Loralie Champagne, MD with cardiology for NICM. Last office visit 10/10/13. F/u in 4-6 weeks recommended but did not occur.   PMH includes:  CHF, nonischemic cardiomyopathy, HTN, DM, ESRD on hemodialysis, OSA, anemia of chronic disease. Former smoker.   Medications include: ASA 81mg , carvedilol, glipmepiride, januvia  BP at 06/10/17 office visit was 189/124, and 205/123 on recheck.   Labs will be obtained day of surgery  Cardiac cath 05/16/13:  1. No angiographic evidence of CAD 2. Non-ischemic cardiomyopathy 3. Mild pulmonary HTN  Echo 01/08/13:  - Left ventricle: There may be mild noncompaction, based onthe images of the apex. Also, images of the LV wall are alittle bright. Amyloid will need to be considered. Thecavity size was mildly dilated. Wall thickness wasincreased in a pattern of moderate to severe LVH. Theestimated ejection fraction was 30%. Diffuse hypokinesis.Findings consistent with left ventricular diastolic dysfunction. - Aortic valve: Mild regurgitation. - Left atrium: The atrium was moderately to severelydilated. - Right ventricle: The cavity size was moderately toseverely dilated. Systolic function was moderately toseverely reduced. - Right atrium: The atrium was mildly dilated. - Pulmonary arteries: PA peak pressure: 4mm Hg (S). - Pericardium, extracardiac: A trivial pericardial effusionwas identified posterior to the heart.  Reviewed case with Dr. Therisa Doyne. Pt will need to see cardiology prior to surgery.  I notified Zigmund Daniel in Dr. Nicole Cella office.   Willeen Cass, FNP-BC [facility]/Anesthesiology Phone: [phone] 06/18/2017 1:01 PM

## 2017-06-18 NOTE — Progress Notes (Signed)
Mr Knoedler denies chest pain or shortness of breath.  Patient states that he has "atrial fib at times."  I asked how he knows this - he said , it beats irregularly and then it goes back to normal.  Patient reports that the last time that happened was a couple of months ago.  I asked patient if he was seen by a doctor or if he had an EKG, he said no.  I asked patient if he had seen a cardiologist recently, he responded, "no it has been a long while."  Mr Berrios reports that his fasting CBG's run around 160 and later in the day 180.  I instructed patient to not take Glimepiride this evening, and to not take any mediations for diabetes in am. I instructed patient to check CBG after awaking and every 2 hours until arrival  to the hospital.  I Instructed patient if CBG is less than 70 to drink  1/2 cup of a clear juice. Recheck CBG in 15 minutes then call pre- op desk at [phone] for further instructions

## 2017-06-19 ENCOUNTER — Ambulatory Visit (HOSPITAL_COMMUNITY): Admission: RE | Admit: 2017-06-19 | Payer: Medicare Other | Source: Ambulatory Visit | Admitting: Vascular Surgery

## 2017-06-19 HISTORY — DX: Gastro-esophageal reflux disease without esophagitis: K21.9

## 2017-06-19 SURGERY — RESECTION OF ARTERIOVENOUS FISTULA ANEURYSM
Anesthesia: General | Laterality: Left

## 2017-06-20 DIAGNOSIS — D509 Iron deficiency anemia, unspecified: Secondary | ICD-10-CM | POA: Diagnosis not present

## 2017-06-20 DIAGNOSIS — N186 End stage renal disease: Secondary | ICD-10-CM | POA: Diagnosis not present

## 2017-06-20 DIAGNOSIS — N2581 Secondary hyperparathyroidism of renal origin: Secondary | ICD-10-CM | POA: Diagnosis not present

## 2017-06-20 DIAGNOSIS — E119 Type 2 diabetes mellitus without complications: Secondary | ICD-10-CM | POA: Diagnosis not present

## 2017-06-20 DIAGNOSIS — D631 Anemia in chronic kidney disease: Secondary | ICD-10-CM | POA: Diagnosis not present

## 2017-06-20 NOTE — Progress Notes (Deleted)
Cardiology Office Note   Date:  06/20/2017   ID:  Joshua Patch., DOB 12-21-1980, MRN [id number]  PCP:  System, Provider Not In  Cardiologist:   No primary care provider on file. Referring:  ***  No chief complaint on file.     History of Present Illness: Joshua [name]. is a 37 y.o. male who presents for preop evaluation prior to AV fistula placement.   ***     Past Medical History:  Diagnosis Date  . Anemia of chronic disease   . Arthritis   . Chronic combined systolic and diastolic heart failure ([place])    a.  Echo (12/2012):  Somewhat bright myocardium, moderate to severe LVH, EF 30%, diffuse HK, diastolic dysfunction, mild AI, moderate to severe LAE, moderate to severe RV, moderate to severe reduced RVSF, mild RAE, PASP 38 mm Hg.  . Depression   . ESRD (end stage renal disease) ([place])    hemodialysis Tues, Thurs, Sat- [address].  . GERD (gastroesophageal reflux disease)   . Headache(784.0)    -no headache since starting dialysis   . Hx of cardiac catheterization    a. Nuclear (04/2013):  Mild inferolateral ischemia, EF 46%;  b. R/L HC (04/2013):  No CAD, RA 5, RV 44/3/8, PA 43/16 (mean 28), PCWP 15  . Hypertension   . Hypertensive cardiovascular disease    Renal Artery [place] (09/2012): No evidence of renal artery stenosis  . NICM (nonischemic cardiomyopathy) ([place])   . Sleep apnea   . Type II diabetes mellitus ([place])     Past Surgical History:  Procedure Laterality Date  . AV FISTULA PLACEMENT Left 04/18/2013   Procedure: ARTERIOVENOUS (AV) FISTULA CREATION;  Surgeon: Elam Dutch, MD;  Location: [place];  Service: Vascular;  Laterality: Left;  . INSERTION OF DIALYSIS CATHETER N/A 05/10/2013   Procedure: INSERTION OF DIALYSIS CATHETER;  Surgeon: Elam Dutch, MD;  Location: [place];  Service: Vascular;  Laterality: N/A;  . LEFT AND RIGHT HEART CATHETERIZATION WITH CORONARY ANGIOGRAM N/A 05/16/2013   Procedure: LEFT AND RIGHT HEART CATHETERIZATION WITH CORONARY  ANGIOGRAM;  Surgeon: Burnell Blanks, MD;  Location: [facility] CATH LAB;  Service: Cardiovascular;  Laterality: N/A;     Current Outpatient Medications  Medication Sig Dispense Refill  . aspirin 81 MG tablet Take 81 mg by mouth daily.     . calcium acetate (PHOSLO) 667 MG capsule Take 1,334 mg by mouth 3 (three) times daily with meals.     . carvedilol (COREG) 12.5 MG tablet Take 25 mg by mouth 2 (two) times daily with a meal.     . cinacalcet (SENSIPAR) 30 MG tablet Take 60 mg by mouth daily.    Marland Kitchen glimepiride (AMARYL) 4 MG tablet Take 8 mg by mouth 2 (two) times daily.     . hydrALAZINE (APRESOLINE) 25 MG tablet Take 1 tablet (25 mg total) by mouth 3 (three) times daily. (Patient not taking: Reported on 06/10/2017) 90 tablet 11  . JANUVIA 100 MG tablet Take 100 mg by mouth daily.     . minoxidil (LONITEN) 2.5 MG tablet Take 2 tablets (5 mg total) by mouth 2 (two) times daily. (Patient not taking: Reported on 06/10/2017) 120 tablet 0  . multivitamin (RENA-VIT) TABS tablet Take 1 tablet by mouth daily.    . promethazine (PHENERGAN) 12.5 MG tablet Take 12.5 mg by mouth every 6 (six) hours as needed for nausea or vomiting.     No current facility-administered  medications for this visit.     Allergies:   Isosorbide dinitrate and Nitrates, organic    Social History:  The patient  reports that he quit smoking about 17 years ago. His smoking use included cigarettes. He quit after 0.10 years of use. he has never used smokeless tobacco. He reports that he does not drink alcohol or use drugs.   Family History:  The patient's ***family history includes Diabetes in his father and maternal grandfather; Hypertension in his father, maternal grandfather, and maternal grandmother; Kidney disease in his maternal grandfather.    ROS:  Please see the history of present illness.   Otherwise, review of systems are positive for {NONE DEFAULTED:18576::"none"}.   All other systems are reviewed and negative.     PHYSICAL EXAM: VS:  There were no vitals taken for this visit. , BMI There is no height or weight on file to calculate BMI. GENERAL:  Well appearing HEENT:  Pupils equal round and reactive, fundi not visualized, oral mucosa unremarkable NECK:  No jugular venous distention, waveform within normal limits, carotid upstroke brisk and symmetric, no bruits, no thyromegaly LYMPHATICS:  No cervical, inguinal adenopathy LUNGS:  Clear to auscultation bilaterally BACK:  No CVA tenderness CHEST:  Unremarkable HEART:  PMI not displaced or sustained,S1 and S2 within normal limits, no S3, no S4, no clicks, no rubs, *** murmurs ABD:  Flat, positive bowel sounds normal in frequency in pitch, no bruits, no rebound, no guarding, no midline pulsatile mass, no hepatomegaly, no splenomegaly EXT:  2 plus pulses throughout, no edema, no cyanosis no clubbing SKIN:  No rashes no nodules NEURO:  Cranial nerves II through XII grossly intact, motor grossly intact throughout PSYCH:  Cognitively intact, oriented to person place and time    EKG:  EKG {ACTION; IS/IS QXI:50388828} ordered today. The ekg ordered today demonstrates ***   Recent Labs: No results found for requested labs within last 8760 hours.    Lipid Panel    Component Value Date/Time   CHOL 107 01/25/2013 0338   TRIG 105 01/25/2013 0338   HDL 17 (L) 01/25/2013 0338   CHOLHDL 6.3 01/25/2013 0338   VLDL 21 01/25/2013 0338   LDLCALC 69 01/25/2013 0338      Wt Readings from Last 3 Encounters:  06/10/17 275 lb (124.7 kg)  10/10/13 273 lb 3.2 oz (123.9 kg)  05/17/13 242 lb 14.4 oz (110.2 kg)      Other studies Reviewed: Additional studies/ records that were reviewed today include: ***. Review of the above records demonstrates:  Please see elsewhere in the note.  ***   ASSESSMENT AND PLAN:  *** Preop:  ***  Chronic diasotlic and systolic HF:  ***  Sleep apnea:  ***  HTN:  ***  DM:  ***   Current medicines are reviewed at  length with the patient today.  The patient {ACTIONS; HAS/DOES NOT HAVE:19233} concerns regarding medicines.  The following changes have been made:  {PLAN; NO CHANGE:13088:s}  Labs/ tests ordered today include: *** No orders of the defined types were placed in this encounter.    Disposition:   FU with ***    Signed, Minus Breeding, MD  06/20/2017 9:46 PM    [facility]

## 2017-06-22 ENCOUNTER — Ambulatory Visit: Payer: Medicare Other | Admitting: Cardiology

## 2017-06-23 DIAGNOSIS — N2581 Secondary hyperparathyroidism of renal origin: Secondary | ICD-10-CM | POA: Diagnosis not present

## 2017-06-23 DIAGNOSIS — D631 Anemia in chronic kidney disease: Secondary | ICD-10-CM | POA: Diagnosis not present

## 2017-06-23 DIAGNOSIS — E119 Type 2 diabetes mellitus without complications: Secondary | ICD-10-CM | POA: Diagnosis not present

## 2017-06-23 DIAGNOSIS — N186 End stage renal disease: Secondary | ICD-10-CM | POA: Diagnosis not present

## 2017-06-23 DIAGNOSIS — D509 Iron deficiency anemia, unspecified: Secondary | ICD-10-CM | POA: Diagnosis not present

## 2017-06-25 ENCOUNTER — Encounter: Payer: Self-pay | Admitting: Cardiology

## 2017-06-25 DIAGNOSIS — Z992 Dependence on renal dialysis: Secondary | ICD-10-CM | POA: Diagnosis not present

## 2017-06-25 DIAGNOSIS — N2581 Secondary hyperparathyroidism of renal origin: Secondary | ICD-10-CM | POA: Diagnosis not present

## 2017-06-25 DIAGNOSIS — E119 Type 2 diabetes mellitus without complications: Secondary | ICD-10-CM | POA: Diagnosis not present

## 2017-06-25 DIAGNOSIS — D509 Iron deficiency anemia, unspecified: Secondary | ICD-10-CM | POA: Diagnosis not present

## 2017-06-25 DIAGNOSIS — D631 Anemia in chronic kidney disease: Secondary | ICD-10-CM | POA: Diagnosis not present

## 2017-06-25 DIAGNOSIS — I120 Hypertensive chronic kidney disease with stage 5 chronic kidney disease or end stage renal disease: Secondary | ICD-10-CM | POA: Diagnosis not present

## 2017-06-25 DIAGNOSIS — N186 End stage renal disease: Secondary | ICD-10-CM | POA: Diagnosis not present

## 2017-06-26 DIAGNOSIS — I120 Hypertensive chronic kidney disease with stage 5 chronic kidney disease or end stage renal disease: Secondary | ICD-10-CM | POA: Diagnosis not present

## 2017-06-26 DIAGNOSIS — N186 End stage renal disease: Secondary | ICD-10-CM | POA: Diagnosis not present

## 2017-06-26 DIAGNOSIS — Z992 Dependence on renal dialysis: Secondary | ICD-10-CM | POA: Diagnosis not present

## 2017-06-27 DIAGNOSIS — N186 End stage renal disease: Secondary | ICD-10-CM | POA: Diagnosis not present

## 2017-06-27 DIAGNOSIS — E119 Type 2 diabetes mellitus without complications: Secondary | ICD-10-CM | POA: Diagnosis not present

## 2017-06-27 DIAGNOSIS — D509 Iron deficiency anemia, unspecified: Secondary | ICD-10-CM | POA: Diagnosis not present

## 2017-06-27 DIAGNOSIS — D631 Anemia in chronic kidney disease: Secondary | ICD-10-CM | POA: Diagnosis not present

## 2017-06-27 DIAGNOSIS — N2581 Secondary hyperparathyroidism of renal origin: Secondary | ICD-10-CM | POA: Diagnosis not present

## 2017-06-30 DIAGNOSIS — D509 Iron deficiency anemia, unspecified: Secondary | ICD-10-CM | POA: Diagnosis not present

## 2017-06-30 DIAGNOSIS — N186 End stage renal disease: Secondary | ICD-10-CM | POA: Diagnosis not present

## 2017-06-30 DIAGNOSIS — N2581 Secondary hyperparathyroidism of renal origin: Secondary | ICD-10-CM | POA: Diagnosis not present

## 2017-06-30 DIAGNOSIS — E119 Type 2 diabetes mellitus without complications: Secondary | ICD-10-CM | POA: Diagnosis not present

## 2017-06-30 DIAGNOSIS — D631 Anemia in chronic kidney disease: Secondary | ICD-10-CM | POA: Diagnosis not present

## 2017-07-02 DIAGNOSIS — N186 End stage renal disease: Secondary | ICD-10-CM | POA: Diagnosis not present

## 2017-07-02 DIAGNOSIS — N2581 Secondary hyperparathyroidism of renal origin: Secondary | ICD-10-CM | POA: Diagnosis not present

## 2017-07-02 DIAGNOSIS — D631 Anemia in chronic kidney disease: Secondary | ICD-10-CM | POA: Diagnosis not present

## 2017-07-02 DIAGNOSIS — D509 Iron deficiency anemia, unspecified: Secondary | ICD-10-CM | POA: Diagnosis not present

## 2017-07-02 DIAGNOSIS — E119 Type 2 diabetes mellitus without complications: Secondary | ICD-10-CM | POA: Diagnosis not present

## 2017-07-02 NOTE — Progress Notes (Signed)
Cardiology Office Note   Date:  07/03/2017   ID:  Joshua Patch., DOB 12-May-1981, MRN [id number]  PCP:  Patient, No Pcp Per  Cardiologist:   No primary care provider on file. Referring:  Dr. Scot Dock  Chief Complaint  Patient presents with  . Pre-op Exam  . Cardiomyopathy      History of Present Illness: Joshua [name]. is a 37 y.o. male who presents for preop evaluation prior to AV fistula placement.   He is previously had a nonischemic cardiomyopathy.  He had cardiac cath in the past and has had very difficult to control hypertension and this was felt to be the etiology.  He was to have follow-up echocardiography in 2015 but he says he did not know about this.  He has not had any cardiology follow-up.  In the past he seen multiple cardiologists.  He is followed closely however in dialysis and has his volume manage.  Dr. Jimmy Footman manages his hypertension.  He says that his blood pressure is very well controlled when he is not predialysis.  The patient is going to have a fistula repaired.  He is never had problems with surgery before.  He denies any chest pressure, neck or arm discomfort.  He does not have any palpitations, presyncope or syncope.  He has no weight gain or edema.   Past Medical History:  Diagnosis Date  . Anemia of chronic disease   . Arthritis   . Chronic combined systolic and diastolic heart failure ([place])    a.  Echo (12/2012):  Somewhat bright myocardium, moderate to severe LVH, EF 30%, diffuse HK, diastolic dysfunction, mild AI, moderate to severe LAE, moderate to severe RV, moderate to severe reduced RVSF, mild RAE, PASP 38 mm Hg.  . Depression   . ESRD (end stage renal disease) ([place])    hemodialysis Tues, Thurs, Sat- [address]  . GERD (gastroesophageal reflux disease)   . Headache(784.0)    -no headache since starting dialysis   . Hx of cardiac catheterization    a. Nuclear (04/2013):  Mild inferolateral ischemia, EF 46%;  b. R/L HC (04/2013):  No  CAD, RA 5, RV 44/3/8, PA 43/16 (mean 28), PCWP 15  . Hypertension   . Hypertensive cardiovascular disease    Renal Artery [place] (09/2012): No evidence of renal artery stenosis  . NICM (nonischemic cardiomyopathy) ([place])   . Sleep apnea   . Type II diabetes mellitus ([place])     Past Surgical History:  Procedure Laterality Date  . AV FISTULA PLACEMENT Left 04/18/2013   Procedure: ARTERIOVENOUS (AV) FISTULA CREATION;  Surgeon: Elam Dutch, MD;  Location: [facility];  Service: Vascular;  Laterality: Left;  . INSERTION OF DIALYSIS CATHETER N/A 05/10/2013   Procedure: INSERTION OF DIALYSIS CATHETER;  Surgeon: Elam Dutch, MD;  Location: [place];  Service: Vascular;  Laterality: N/A;  . LEFT AND RIGHT HEART CATHETERIZATION WITH CORONARY ANGIOGRAM N/A 05/16/2013   Procedure: LEFT AND RIGHT HEART CATHETERIZATION WITH CORONARY ANGIOGRAM;  Surgeon: Burnell Blanks, MD;  Location: [facility] CATH LAB;  Service: Cardiovascular;  Laterality: N/A;     Current Outpatient Medications  Medication Sig Dispense Refill  . aspirin 81 MG tablet Take 81 mg by mouth daily.     . calcium acetate (PHOSLO) 667 MG capsule Take 1,334 mg by mouth 3 (three) times daily with meals.     . carvedilol (COREG) 12.5 MG tablet Take 25 mg by mouth 2 (two) times daily  with a meal.     . cinacalcet (SENSIPAR) 30 MG tablet Take 60 mg by mouth daily.    Marland Kitchen glimepiride (AMARYL) 4 MG tablet Take 8 mg by mouth 2 (two) times daily.     . hydrALAZINE (APRESOLINE) 25 MG tablet Take 1 tablet (25 mg total) by mouth 3 (three) times daily. 90 tablet 11  . JANUVIA 100 MG tablet Take 100 mg by mouth daily.     . minoxidil (LONITEN) 2.5 MG tablet Take 2 tablets (5 mg total) by mouth 2 (two) times daily. 120 tablet 0  . multivitamin (RENA-VIT) TABS tablet Take 1 tablet by mouth daily.    . promethazine (PHENERGAN) 12.5 MG tablet Take 12.5 mg by mouth every 6 (six) hours as needed for nausea or vomiting.     No current facility-administered  medications for this visit.     Allergies:   Isosorbide dinitrate and Nitrates, organic    Social History:  The patient  reports that he quit smoking about 17 years ago. His smoking use included cigarettes. He quit after 0.10 years of use. he has never used smokeless tobacco. He reports that he does not drink alcohol or use drugs.   Family History:  The patient's family history includes Diabetes in his father and maternal grandfather; Hypertension in his father, maternal grandfather, and maternal grandmother; Kidney disease in his maternal grandfather; Stomach cancer in his mother.    ROS:  Please see the history of present illness.   Otherwise, review of systems are positive for none.   All other systems are reviewed and negative.    PHYSICAL EXAM: VS:  BP (!) 169/103   Pulse 96   Ht 5\' 10"  (1.778 m)   Wt 275 lb 3.2 oz (124.8 kg)   BMI 39.49 kg/m  , BMI Body mass index is 39.49 kg/m. GENERAL:  Well appearing HEENT:  Pupils equal round and reactive, fundi not visualized, oral mucosa unremarkable NECK:  No jugular venous distention, waveform within normal limits, carotid upstroke brisk and symmetric, no bruits, no thyromegaly LYMPHATICS:  No cervical, inguinal adenopathy LUNGS:  Clear to auscultation bilaterally BACK:  No CVA tenderness CHEST:  Unremarkable HEART:  PMI not displaced or sustained,S1 and S2 within normal limits, no S3, no S4, no clicks, no rubs, systolic murmur slight, no diastolic murmurs ABD:  Flat, positive bowel sounds normal in frequency in pitch, no bruits, no rebound, no guarding, no midline pulsatile mass, no hepatomegaly, no splenomegaly EXT:  2 plus pulses throughout, no edema, no cyanosis no clubbing, large left arm fistula with thrill and bruit. SKIN:  No rashes no nodules NEURO:  Cranial nerves II through XII grossly intact, motor grossly intact throughout PSYCH:  Cognitively intact, oriented to person place and time    EKG:  EKG is ordered today. The  ekg ordered today demonstrates sinus rhythm, rate 96, left axis deviation, no acute ST-T wave changes.   Recent Labs: No results found for requested labs within last 8760 hours.    Lipid Panel    Component Value Date/Time   CHOL 107 01/25/2013 0338   TRIG 105 01/25/2013 0338   HDL 17 (L) 01/25/2013 0338   CHOLHDL 6.3 01/25/2013 0338   VLDL 21 01/25/2013 0338   LDLCALC 69 01/25/2013 0338      Wt Readings from Last 3 Encounters:  07/03/17 275 lb 3.2 oz (124.8 kg)  06/10/17 275 lb (124.7 kg)  10/10/13 273 lb 3.2 oz (123.9 kg)  Other studies Reviewed: Additional studies/ records that were reviewed today include: Echo 2015. Review of the above records demonstrates:  Please see elsewhere in the note.     ASSESSMENT AND PLAN:   Preop: I am going to check an echocardiogram but I suspect that the patient will be okay for surgery but would like to see this result first.  He has no anginal symptoms  And is not going for high risk procedure.  Chronic diasotlic and systolic HF: His volume is managed via dialysis.  I will be following up with an echocardiogram.  Sleep apnea: He has chosen not to use CPAP.  HTN: His blood pressure is currently elevated.  However, he has this managed actively by his nephrologist and he says his blood pressure is well controlled after dialysis and nondialysis days and only goes up the night before dialysis.  He actually says it sometimes low at the end of dialysis.  DM: His A1c was slightly elevated.  However, he is very well aware of all of his labs and they are followed very routinely.  He has active management of his diabetes.  Hypertensive heart disease:  See above.   Current medicines are reviewed at length with the patient today.  The patient does not have concerns regarding medicines.  The following changes have been made:  no change  Labs/ tests ordered today include:   Orders Placed This Encounter  Procedures  . EKG 12-Lead  .  ECHOCARDIOGRAM COMPLETE     Disposition:   FU with me in one year or sooner based on the results of the echo.     Signed, Minus Breeding, MD  07/03/2017 5:07 PM    [place]

## 2017-07-03 ENCOUNTER — Encounter: Payer: Self-pay | Admitting: Cardiology

## 2017-07-03 ENCOUNTER — Ambulatory Visit (INDEPENDENT_AMBULATORY_CARE_PROVIDER_SITE_OTHER): Payer: Medicare Other | Admitting: Cardiology

## 2017-07-03 VITALS — BP 169/103 | HR 96 | Ht 70.0 in | Wt 275.2 lb

## 2017-07-03 DIAGNOSIS — I420 Dilated cardiomyopathy: Secondary | ICD-10-CM | POA: Diagnosis not present

## 2017-07-03 DIAGNOSIS — Z01810 Encounter for preprocedural cardiovascular examination: Secondary | ICD-10-CM | POA: Diagnosis not present

## 2017-07-03 DIAGNOSIS — I132 Hypertensive heart and chronic kidney disease with heart failure and with stage 5 chronic kidney disease, or end stage renal disease: Secondary | ICD-10-CM | POA: Diagnosis not present

## 2017-07-03 NOTE — Patient Instructions (Signed)
Medication Instructions:  Continue current medications  If you need a refill on your cardiac medications before your next appointment, please call your pharmacy.  Labwork: None Ordered   Testing/Procedures: Your physician has requested that you have an echocardiogram. Echocardiography is a painless test that uses sound waves to create images of your heart. It provides your doctor with information about the size and shape of your heart and how well your heart's chambers and valves are working. This procedure takes approximately one hour. There are no restrictions for this procedure.  Follow-Up: Your physician wants you to follow-up in: 1 Year. You should receive a reminder letter in the mail two months in advance. If you do not receive a letter, please call our office [phone].    Thank you for choosing [facility] at [facility]!!

## 2017-07-04 DIAGNOSIS — D509 Iron deficiency anemia, unspecified: Secondary | ICD-10-CM | POA: Diagnosis not present

## 2017-07-04 DIAGNOSIS — N2581 Secondary hyperparathyroidism of renal origin: Secondary | ICD-10-CM | POA: Diagnosis not present

## 2017-07-04 DIAGNOSIS — D631 Anemia in chronic kidney disease: Secondary | ICD-10-CM | POA: Diagnosis not present

## 2017-07-04 DIAGNOSIS — E119 Type 2 diabetes mellitus without complications: Secondary | ICD-10-CM | POA: Diagnosis not present

## 2017-07-04 DIAGNOSIS — N186 End stage renal disease: Secondary | ICD-10-CM | POA: Diagnosis not present

## 2017-07-07 DIAGNOSIS — N186 End stage renal disease: Secondary | ICD-10-CM | POA: Diagnosis not present

## 2017-07-07 DIAGNOSIS — E119 Type 2 diabetes mellitus without complications: Secondary | ICD-10-CM | POA: Diagnosis not present

## 2017-07-07 DIAGNOSIS — N2581 Secondary hyperparathyroidism of renal origin: Secondary | ICD-10-CM | POA: Diagnosis not present

## 2017-07-07 DIAGNOSIS — D509 Iron deficiency anemia, unspecified: Secondary | ICD-10-CM | POA: Diagnosis not present

## 2017-07-07 DIAGNOSIS — D631 Anemia in chronic kidney disease: Secondary | ICD-10-CM | POA: Diagnosis not present

## 2017-07-09 DIAGNOSIS — D631 Anemia in chronic kidney disease: Secondary | ICD-10-CM | POA: Diagnosis not present

## 2017-07-09 DIAGNOSIS — E119 Type 2 diabetes mellitus without complications: Secondary | ICD-10-CM | POA: Diagnosis not present

## 2017-07-09 DIAGNOSIS — N186 End stage renal disease: Secondary | ICD-10-CM | POA: Diagnosis not present

## 2017-07-09 DIAGNOSIS — D509 Iron deficiency anemia, unspecified: Secondary | ICD-10-CM | POA: Diagnosis not present

## 2017-07-09 DIAGNOSIS — N2581 Secondary hyperparathyroidism of renal origin: Secondary | ICD-10-CM | POA: Diagnosis not present

## 2017-07-11 DIAGNOSIS — D631 Anemia in chronic kidney disease: Secondary | ICD-10-CM | POA: Diagnosis not present

## 2017-07-11 DIAGNOSIS — D509 Iron deficiency anemia, unspecified: Secondary | ICD-10-CM | POA: Diagnosis not present

## 2017-07-11 DIAGNOSIS — E119 Type 2 diabetes mellitus without complications: Secondary | ICD-10-CM | POA: Diagnosis not present

## 2017-07-11 DIAGNOSIS — N2581 Secondary hyperparathyroidism of renal origin: Secondary | ICD-10-CM | POA: Diagnosis not present

## 2017-07-11 DIAGNOSIS — N186 End stage renal disease: Secondary | ICD-10-CM | POA: Diagnosis not present

## 2017-07-13 ENCOUNTER — Other Ambulatory Visit: Payer: Self-pay

## 2017-07-13 ENCOUNTER — Ambulatory Visit (HOSPITAL_COMMUNITY): Payer: Medicare Other | Attending: Cardiology

## 2017-07-13 DIAGNOSIS — I420 Dilated cardiomyopathy: Secondary | ICD-10-CM | POA: Diagnosis not present

## 2017-07-13 DIAGNOSIS — E1122 Type 2 diabetes mellitus with diabetic chronic kidney disease: Secondary | ICD-10-CM | POA: Insufficient documentation

## 2017-07-13 DIAGNOSIS — G4730 Sleep apnea, unspecified: Secondary | ICD-10-CM | POA: Insufficient documentation

## 2017-07-13 DIAGNOSIS — N186 End stage renal disease: Secondary | ICD-10-CM | POA: Insufficient documentation

## 2017-07-13 DIAGNOSIS — I509 Heart failure, unspecified: Secondary | ICD-10-CM | POA: Insufficient documentation

## 2017-07-13 DIAGNOSIS — I132 Hypertensive heart and chronic kidney disease with heart failure and with stage 5 chronic kidney disease, or end stage renal disease: Secondary | ICD-10-CM | POA: Insufficient documentation

## 2017-07-14 DIAGNOSIS — N2581 Secondary hyperparathyroidism of renal origin: Secondary | ICD-10-CM | POA: Diagnosis not present

## 2017-07-14 DIAGNOSIS — N186 End stage renal disease: Secondary | ICD-10-CM | POA: Diagnosis not present

## 2017-07-14 DIAGNOSIS — D509 Iron deficiency anemia, unspecified: Secondary | ICD-10-CM | POA: Diagnosis not present

## 2017-07-14 DIAGNOSIS — E119 Type 2 diabetes mellitus without complications: Secondary | ICD-10-CM | POA: Diagnosis not present

## 2017-07-14 DIAGNOSIS — D631 Anemia in chronic kidney disease: Secondary | ICD-10-CM | POA: Diagnosis not present

## 2017-07-16 DIAGNOSIS — N186 End stage renal disease: Secondary | ICD-10-CM | POA: Diagnosis not present

## 2017-07-16 DIAGNOSIS — D509 Iron deficiency anemia, unspecified: Secondary | ICD-10-CM | POA: Diagnosis not present

## 2017-07-16 DIAGNOSIS — N2581 Secondary hyperparathyroidism of renal origin: Secondary | ICD-10-CM | POA: Diagnosis not present

## 2017-07-16 DIAGNOSIS — D631 Anemia in chronic kidney disease: Secondary | ICD-10-CM | POA: Diagnosis not present

## 2017-07-16 DIAGNOSIS — E119 Type 2 diabetes mellitus without complications: Secondary | ICD-10-CM | POA: Diagnosis not present

## 2017-07-18 DIAGNOSIS — D631 Anemia in chronic kidney disease: Secondary | ICD-10-CM | POA: Diagnosis not present

## 2017-07-18 DIAGNOSIS — N2581 Secondary hyperparathyroidism of renal origin: Secondary | ICD-10-CM | POA: Diagnosis not present

## 2017-07-18 DIAGNOSIS — D509 Iron deficiency anemia, unspecified: Secondary | ICD-10-CM | POA: Diagnosis not present

## 2017-07-18 DIAGNOSIS — E119 Type 2 diabetes mellitus without complications: Secondary | ICD-10-CM | POA: Diagnosis not present

## 2017-07-18 DIAGNOSIS — N186 End stage renal disease: Secondary | ICD-10-CM | POA: Diagnosis not present

## 2017-07-21 DIAGNOSIS — E119 Type 2 diabetes mellitus without complications: Secondary | ICD-10-CM | POA: Diagnosis not present

## 2017-07-21 DIAGNOSIS — N2581 Secondary hyperparathyroidism of renal origin: Secondary | ICD-10-CM | POA: Diagnosis not present

## 2017-07-21 DIAGNOSIS — N186 End stage renal disease: Secondary | ICD-10-CM | POA: Diagnosis not present

## 2017-07-21 DIAGNOSIS — D509 Iron deficiency anemia, unspecified: Secondary | ICD-10-CM | POA: Diagnosis not present

## 2017-07-21 DIAGNOSIS — D631 Anemia in chronic kidney disease: Secondary | ICD-10-CM | POA: Diagnosis not present

## 2017-07-23 DIAGNOSIS — E119 Type 2 diabetes mellitus without complications: Secondary | ICD-10-CM | POA: Diagnosis not present

## 2017-07-23 DIAGNOSIS — N2581 Secondary hyperparathyroidism of renal origin: Secondary | ICD-10-CM | POA: Diagnosis not present

## 2017-07-23 DIAGNOSIS — N186 End stage renal disease: Secondary | ICD-10-CM | POA: Diagnosis not present

## 2017-07-23 DIAGNOSIS — D631 Anemia in chronic kidney disease: Secondary | ICD-10-CM | POA: Diagnosis not present

## 2017-07-23 DIAGNOSIS — D509 Iron deficiency anemia, unspecified: Secondary | ICD-10-CM | POA: Diagnosis not present

## 2017-07-24 DIAGNOSIS — Z992 Dependence on renal dialysis: Secondary | ICD-10-CM | POA: Diagnosis not present

## 2017-07-24 DIAGNOSIS — I120 Hypertensive chronic kidney disease with stage 5 chronic kidney disease or end stage renal disease: Secondary | ICD-10-CM | POA: Diagnosis not present

## 2017-07-24 DIAGNOSIS — N186 End stage renal disease: Secondary | ICD-10-CM | POA: Diagnosis not present

## 2017-07-25 DIAGNOSIS — A499 Bacterial infection, unspecified: Secondary | ICD-10-CM | POA: Diagnosis not present

## 2017-07-25 DIAGNOSIS — D631 Anemia in chronic kidney disease: Secondary | ICD-10-CM | POA: Diagnosis not present

## 2017-07-25 DIAGNOSIS — N186 End stage renal disease: Secondary | ICD-10-CM | POA: Diagnosis not present

## 2017-07-25 DIAGNOSIS — E1129 Type 2 diabetes mellitus with other diabetic kidney complication: Secondary | ICD-10-CM | POA: Diagnosis not present

## 2017-07-25 DIAGNOSIS — E8779 Other fluid overload: Secondary | ICD-10-CM | POA: Diagnosis not present

## 2017-07-25 DIAGNOSIS — E119 Type 2 diabetes mellitus without complications: Secondary | ICD-10-CM | POA: Diagnosis not present

## 2017-07-25 DIAGNOSIS — N2581 Secondary hyperparathyroidism of renal origin: Secondary | ICD-10-CM | POA: Diagnosis not present

## 2017-07-25 DIAGNOSIS — D509 Iron deficiency anemia, unspecified: Secondary | ICD-10-CM | POA: Diagnosis not present

## 2017-07-28 ENCOUNTER — Other Ambulatory Visit: Payer: Self-pay | Admitting: *Deleted

## 2017-07-28 DIAGNOSIS — E119 Type 2 diabetes mellitus without complications: Secondary | ICD-10-CM | POA: Diagnosis not present

## 2017-07-28 DIAGNOSIS — D509 Iron deficiency anemia, unspecified: Secondary | ICD-10-CM | POA: Diagnosis not present

## 2017-07-28 DIAGNOSIS — N186 End stage renal disease: Secondary | ICD-10-CM | POA: Diagnosis not present

## 2017-07-28 DIAGNOSIS — A499 Bacterial infection, unspecified: Secondary | ICD-10-CM | POA: Diagnosis not present

## 2017-07-28 DIAGNOSIS — E1129 Type 2 diabetes mellitus with other diabetic kidney complication: Secondary | ICD-10-CM | POA: Diagnosis not present

## 2017-07-28 DIAGNOSIS — N2581 Secondary hyperparathyroidism of renal origin: Secondary | ICD-10-CM | POA: Diagnosis not present

## 2017-07-30 DIAGNOSIS — N186 End stage renal disease: Secondary | ICD-10-CM | POA: Diagnosis not present

## 2017-07-30 DIAGNOSIS — N2581 Secondary hyperparathyroidism of renal origin: Secondary | ICD-10-CM | POA: Diagnosis not present

## 2017-07-30 DIAGNOSIS — E1129 Type 2 diabetes mellitus with other diabetic kidney complication: Secondary | ICD-10-CM | POA: Diagnosis not present

## 2017-07-30 DIAGNOSIS — E119 Type 2 diabetes mellitus without complications: Secondary | ICD-10-CM | POA: Diagnosis not present

## 2017-07-30 DIAGNOSIS — A499 Bacterial infection, unspecified: Secondary | ICD-10-CM | POA: Diagnosis not present

## 2017-07-30 DIAGNOSIS — D509 Iron deficiency anemia, unspecified: Secondary | ICD-10-CM | POA: Diagnosis not present

## 2017-07-31 ENCOUNTER — Other Ambulatory Visit: Payer: Self-pay

## 2017-07-31 ENCOUNTER — Encounter (HOSPITAL_COMMUNITY): Payer: Self-pay | Admitting: *Deleted

## 2017-07-31 MED ORDER — DEXTROSE 5 % IV SOLN
3.0000 g | INTRAVENOUS | Status: AC
  Administered 2017-08-03: 3 g via INTRAVENOUS
  Filled 2017-07-31: qty 3000

## 2017-07-31 NOTE — Progress Notes (Signed)
Anesthesia Chart Review:  Pt is a same-day workup  Pt is a 37 year old male scheduled for plication of L arm AV fistula aneurysm on 08/03/2017 with Curt Jews, MD  - Surgery was originally scheduled for 06/19/17 but was delayed in order for pt to be evaluated by cardiology.  Saw cardiologist Minus Breeding, MD 07/03/17. Echo ordered, results below. Pt cleared for surgery in comment on echo.   - Nephrologist is Mauricia Area, MD  PMH includes:  CHF, nonischemic cardiomyopathy, HTN, DM, ESRD on hemodialysis, OSA, anemia of chronic disease. Former smoker.   Medications include: ASA 81mg , carvedilol, glipmepiride, januvia  Labs will be obtained day of surgery  EKG 07/03/17: NSR.  LAD.  Pulmonary disease pattern.  Minimal voltage criteria for LVH, may be normal variant.  Prolonged QT (QTc 510 ms)  Echo 07/13/17:  - Left ventricle: The cavity size was mildly dilated. There was moderate concentric hypertrophy. Systolic function was mildly to moderately reduced. The estimated ejection fraction was in the range of 40% to 45%. Mild diffuse hypokinesis with no identifiable regional variations. Features are consistent with a pseudonormal left ventricular filling pattern, with concomitant abnormal relaxation and increased filling pressure (grade 2 diastolic dysfunction). - Left atrium: The atrium was moderately to severely dilated. - Impressions: Compared to 2014, left ventricular systolic function has improved.   Cardiac cath 05/16/13:  1. No angiographic evidence of CAD 2. Non-ischemic cardiomyopathy 3. Mild pulmonary HTN   By notes, pt reports his HTN is uncontrolled/BP high prior to dialysis, and BP sometimes low after dialysis.   If labs and BP acceptable day of surgery, I anticipate pt can proceed with surgery as scheduled.   Willeen Cass, FNP-BC [facility]/Anesthesiology Phone: [phone] 07/31/2017 2:42 PM

## 2017-07-31 NOTE — Progress Notes (Signed)
Pt denies any acute cardiopulmonary issues. Pt under the care of Dr. Lissa Merlin, Cardiology at [facility]. Pt denies having a chest x ray within the last year. Pt made aware to stop taking otc vitamins, fish oil and herbal medications. Do not take any NSAIDs ie: Ibuprofen, Advil, Naproxen (Aleve), Motrin, BC and Goody Powder. Pt made aware to not take Glimepiride the night before surgery and morning of surgery and no Januvia DOS. Pt made aware to check BG every 2 hours prior to arrival to hospital on DOS. Pt made aware to treat a BG < 70 with 4 ounces of apple  juice, wait 15 minutes after intervention to recheck BG, if BG remains < 70, call Short Stay unit to speak with a nurse.Pt verbalized understanding of all pre-op instructions. Anesthesia asked to review pt clearance note.

## 2017-08-01 DIAGNOSIS — D509 Iron deficiency anemia, unspecified: Secondary | ICD-10-CM | POA: Diagnosis not present

## 2017-08-01 DIAGNOSIS — E119 Type 2 diabetes mellitus without complications: Secondary | ICD-10-CM | POA: Diagnosis not present

## 2017-08-01 DIAGNOSIS — N2581 Secondary hyperparathyroidism of renal origin: Secondary | ICD-10-CM | POA: Diagnosis not present

## 2017-08-01 DIAGNOSIS — A499 Bacterial infection, unspecified: Secondary | ICD-10-CM | POA: Diagnosis not present

## 2017-08-01 DIAGNOSIS — E1129 Type 2 diabetes mellitus with other diabetic kidney complication: Secondary | ICD-10-CM | POA: Diagnosis not present

## 2017-08-01 DIAGNOSIS — N186 End stage renal disease: Secondary | ICD-10-CM | POA: Diagnosis not present

## 2017-08-03 ENCOUNTER — Encounter (HOSPITAL_COMMUNITY): Payer: Self-pay | Admitting: *Deleted

## 2017-08-03 ENCOUNTER — Ambulatory Visit (HOSPITAL_COMMUNITY)
Admission: RE | Admit: 2017-08-03 | Discharge: 2017-08-03 | Disposition: A | Discharge status: Home / Self Care | Payer: Medicare Other | Source: Ambulatory Visit | Attending: Vascular Surgery | Admitting: Vascular Surgery

## 2017-08-03 ENCOUNTER — Encounter (HOSPITAL_COMMUNITY)
Admission: RE | Disposition: A | Discharge status: Home / Self Care | Payer: Self-pay | Source: Ambulatory Visit | Attending: Vascular Surgery

## 2017-08-03 ENCOUNTER — Other Ambulatory Visit: Payer: Self-pay

## 2017-08-03 ENCOUNTER — Ambulatory Visit (HOSPITAL_COMMUNITY): Payer: Medicare Other | Admitting: Certified Registered"

## 2017-08-03 DIAGNOSIS — I132 Hypertensive heart and chronic kidney disease with heart failure and with stage 5 chronic kidney disease, or end stage renal disease: Secondary | ICD-10-CM | POA: Diagnosis not present

## 2017-08-03 DIAGNOSIS — T829XXA Unspecified complication of cardiac and vascular prosthetic device, implant and graft, initial encounter: Secondary | ICD-10-CM | POA: Diagnosis not present

## 2017-08-03 DIAGNOSIS — G4733 Obstructive sleep apnea (adult) (pediatric): Secondary | ICD-10-CM | POA: Insufficient documentation

## 2017-08-03 DIAGNOSIS — D631 Anemia in chronic kidney disease: Secondary | ICD-10-CM | POA: Insufficient documentation

## 2017-08-03 DIAGNOSIS — Z7982 Long term (current) use of aspirin: Secondary | ICD-10-CM | POA: Diagnosis not present

## 2017-08-03 DIAGNOSIS — Z992 Dependence on renal dialysis: Secondary | ICD-10-CM | POA: Diagnosis not present

## 2017-08-03 DIAGNOSIS — Z87891 Personal history of nicotine dependence: Secondary | ICD-10-CM | POA: Insufficient documentation

## 2017-08-03 DIAGNOSIS — Z8249 Family history of ischemic heart disease and other diseases of the circulatory system: Secondary | ICD-10-CM | POA: Insufficient documentation

## 2017-08-03 DIAGNOSIS — Y832 Surgical operation with anastomosis, bypass or graft as the cause of abnormal reaction of the patient, or of later complication, without mention of misadventure at the time of the procedure: Secondary | ICD-10-CM | POA: Diagnosis not present

## 2017-08-03 DIAGNOSIS — I428 Other cardiomyopathies: Secondary | ICD-10-CM | POA: Insufficient documentation

## 2017-08-03 DIAGNOSIS — N186 End stage renal disease: Secondary | ICD-10-CM | POA: Insufficient documentation

## 2017-08-03 DIAGNOSIS — Z7984 Long term (current) use of oral hypoglycemic drugs: Secondary | ICD-10-CM | POA: Insufficient documentation

## 2017-08-03 DIAGNOSIS — K219 Gastro-esophageal reflux disease without esophagitis: Secondary | ICD-10-CM | POA: Insufficient documentation

## 2017-08-03 DIAGNOSIS — T82898A Other specified complication of vascular prosthetic devices, implants and grafts, initial encounter: Secondary | ICD-10-CM | POA: Diagnosis not present

## 2017-08-03 DIAGNOSIS — E1122 Type 2 diabetes mellitus with diabetic chronic kidney disease: Secondary | ICD-10-CM | POA: Diagnosis not present

## 2017-08-03 DIAGNOSIS — F419 Anxiety disorder, unspecified: Secondary | ICD-10-CM | POA: Insufficient documentation

## 2017-08-03 DIAGNOSIS — I272 Pulmonary hypertension, unspecified: Secondary | ICD-10-CM | POA: Diagnosis not present

## 2017-08-03 DIAGNOSIS — I5043 Acute on chronic combined systolic (congestive) and diastolic (congestive) heart failure: Secondary | ICD-10-CM | POA: Diagnosis not present

## 2017-08-03 DIAGNOSIS — I5042 Chronic combined systolic (congestive) and diastolic (congestive) heart failure: Secondary | ICD-10-CM | POA: Insufficient documentation

## 2017-08-03 DIAGNOSIS — Z6837 Body mass index (BMI) 37.0-37.9, adult: Secondary | ICD-10-CM | POA: Diagnosis not present

## 2017-08-03 DIAGNOSIS — M1990 Unspecified osteoarthritis, unspecified site: Secondary | ICD-10-CM | POA: Insufficient documentation

## 2017-08-03 HISTORY — PX: FISTULA SUPERFICIALIZATION: SHX6341

## 2017-08-03 HISTORY — DX: Anxiety disorder, unspecified: F41.9

## 2017-08-03 HISTORY — DX: Bronchitis, not specified as acute or chronic: J40

## 2017-08-03 LAB — POCT I-STAT 4, (NA,K, GLUC, HGB,HCT)
Glucose, Bld: 190 mg/dL — ABNORMAL HIGH (ref 65–99)
HCT: 32 % — ABNORMAL LOW (ref 39.0–52.0)
Hemoglobin: 10.9 g/dL — ABNORMAL LOW (ref 13.0–17.0)
POTASSIUM: 4.4 mmol/L (ref 3.5–5.1)
Sodium: 135 mmol/L (ref 135–145)

## 2017-08-03 LAB — GLUCOSE, CAPILLARY
GLUCOSE-CAPILLARY: 194 mg/dL — AB (ref 65–99)
GLUCOSE-CAPILLARY: 172 mg/dL — AB (ref 65–99)
GLUCOSE-CAPILLARY: 194 mg/dL — AB (ref 65–99)

## 2017-08-03 SURGERY — FISTULA SUPERFICIALIZATION
Anesthesia: General | Site: Arm Upper | Laterality: Left

## 2017-08-03 MED ORDER — OXYCODONE-ACETAMINOPHEN 5-325 MG PO TABS: 1.0000 | ORAL_TABLET | Freq: Four times a day (QID) | ORAL | 0 refills | Status: DC | PRN

## 2017-08-03 MED ORDER — ONDANSETRON HCL 4 MG/2ML IJ SOLN
INTRAMUSCULAR | Status: DC | PRN
  Administered 2017-08-03: 4 mg via INTRAVENOUS

## 2017-08-03 MED ORDER — PROPOFOL 10 MG/ML IV BOLUS
INTRAVENOUS | Status: DC | PRN
  Administered 2017-08-03: 200 mg via INTRAVENOUS

## 2017-08-03 MED ORDER — MIDAZOLAM HCL 2 MG/2ML IJ SOLN
INTRAMUSCULAR | Status: AC
  Filled 2017-08-03: qty 2

## 2017-08-03 MED ORDER — CARVEDILOL 25 MG PO TABS
25.0000 mg | ORAL_TABLET | Freq: Once | ORAL | Status: DC
  Filled 2017-08-03: qty 1

## 2017-08-03 MED ORDER — LIDOCAINE HCL (CARDIAC) 20 MG/ML IV SOLN
INTRAVENOUS | Status: AC
  Filled 2017-08-03: qty 5

## 2017-08-03 MED ORDER — SODIUM CHLORIDE 0.9 % IV SOLN
INTRAVENOUS | Status: DC | PRN
  Administered 2017-08-03: 10:00:00

## 2017-08-03 MED ORDER — PHENYLEPHRINE 40 MCG/ML (10ML) SYRINGE FOR IV PUSH (FOR BLOOD PRESSURE SUPPORT)
PREFILLED_SYRINGE | INTRAVENOUS | Status: DC | PRN
  Administered 2017-08-03 (×2): 80 ug via INTRAVENOUS

## 2017-08-03 MED ORDER — CARVEDILOL 12.5 MG PO TABS
ORAL_TABLET | ORAL | Status: AC
  Administered 2017-08-03: 25 mg
  Filled 2017-08-03: qty 2

## 2017-08-03 MED ORDER — SUCCINYLCHOLINE CHLORIDE 200 MG/10ML IV SOSY
PREFILLED_SYRINGE | INTRAVENOUS | Status: AC
  Filled 2017-08-03: qty 10

## 2017-08-03 MED ORDER — SODIUM CHLORIDE 0.9 % IV SOLN
INTRAVENOUS | Status: DC
  Administered 2017-08-03: 10:00:00 via INTRAVENOUS

## 2017-08-03 MED ORDER — SUCCINYLCHOLINE CHLORIDE 20 MG/ML IJ SOLN
INTRAMUSCULAR | Status: DC | PRN
  Administered 2017-08-03: 120 mg via INTRAVENOUS

## 2017-08-03 MED ORDER — SODIUM CHLORIDE 0.9 % IV SOLN
INTRAVENOUS | Status: DC
  Administered 2017-08-03: 08:00:00 via INTRAVENOUS

## 2017-08-03 MED ORDER — ONDANSETRON HCL 4 MG/2ML IJ SOLN
INTRAMUSCULAR | Status: AC
  Filled 2017-08-03: qty 2

## 2017-08-03 MED ORDER — 0.9 % SODIUM CHLORIDE (POUR BTL) OPTIME
TOPICAL | Status: DC | PRN
  Administered 2017-08-03: 1000 mL

## 2017-08-03 MED ORDER — FENTANYL CITRATE (PF) 250 MCG/5ML IJ SOLN
INTRAMUSCULAR | Status: AC
  Filled 2017-08-03: qty 5

## 2017-08-03 MED ORDER — FENTANYL CITRATE (PF) 100 MCG/2ML IJ SOLN: 25.0000 ug | INTRAMUSCULAR | Status: DC | PRN

## 2017-08-03 MED ORDER — ONDANSETRON HCL 4 MG/2ML IJ SOLN: 4.0000 mg | Freq: Once | INTRAMUSCULAR | Status: DC | PRN

## 2017-08-03 MED ORDER — FENTANYL CITRATE (PF) 100 MCG/2ML IJ SOLN
INTRAMUSCULAR | Status: DC | PRN
  Administered 2017-08-03: 100 ug via INTRAVENOUS
  Administered 2017-08-03: 150 ug via INTRAVENOUS

## 2017-08-03 MED ORDER — MEPERIDINE HCL 50 MG/ML IJ SOLN: 6.2500 mg | INTRAMUSCULAR | Status: DC | PRN

## 2017-08-03 MED ORDER — PROPOFOL 10 MG/ML IV BOLUS
INTRAVENOUS | Status: AC
  Filled 2017-08-03: qty 20

## 2017-08-03 MED ORDER — LIDOCAINE HCL (CARDIAC) 20 MG/ML IV SOLN
INTRAVENOUS | Status: DC | PRN
  Administered 2017-08-03: 100 mg via INTRAVENOUS

## 2017-08-03 MED ORDER — MIDAZOLAM HCL 5 MG/5ML IJ SOLN
INTRAMUSCULAR | Status: DC | PRN
  Administered 2017-08-03: 2 mg via INTRAVENOUS

## 2017-08-03 MED ORDER — PHENYLEPHRINE 40 MCG/ML (10ML) SYRINGE FOR IV PUSH (FOR BLOOD PRESSURE SUPPORT)
PREFILLED_SYRINGE | INTRAVENOUS | Status: AC
  Filled 2017-08-03: qty 10

## 2017-08-03 SURGICAL SUPPLY — 30 items
ARMBAND PINK RESTRICT EXTREMIT (MISCELLANEOUS) ×3 IMPLANT
BANDAGE ELASTIC 6 VELCRO ST LF (GAUZE/BANDAGES/DRESSINGS) ×3 IMPLANT
BNDG GAUZE ELAST 4 BULKY (GAUZE/BANDAGES/DRESSINGS) ×3 IMPLANT
CANISTER SUCT 3000ML PPV (MISCELLANEOUS) ×3 IMPLANT
CLIP LIGATING EXTRA MED SLVR (CLIP) ×3 IMPLANT
CLIP LIGATING EXTRA SM BLUE (MISCELLANEOUS) ×3 IMPLANT
COVER PROBE W GEL 5X96 (DRAPES) IMPLANT
DECANTER SPIKE VIAL GLASS SM (MISCELLANEOUS) ×3 IMPLANT
DERMABOND ADVANCED (GAUZE/BANDAGES/DRESSINGS) ×2
DERMABOND ADVANCED .7 DNX12 (GAUZE/BANDAGES/DRESSINGS) ×1 IMPLANT
ELECT REM PT RETURN 9FT ADLT (ELECTROSURGICAL) ×3
ELECTRODE REM PT RTRN 9FT ADLT (ELECTROSURGICAL) ×1 IMPLANT
GLOVE BIO SURGEON STRL SZ 6.5 (GLOVE) ×6 IMPLANT
GLOVE BIO SURGEONS STRL SZ 6.5 (GLOVE) ×3
GLOVE SS BIOGEL STRL SZ 7.5 (GLOVE) ×1 IMPLANT
GLOVE SUPERSENSE BIOGEL SZ 7.5 (GLOVE) ×2
GOWN STRL REUS W/ TWL LRG LVL3 (GOWN DISPOSABLE) ×3 IMPLANT
GOWN STRL REUS W/TWL LRG LVL3 (GOWN DISPOSABLE) ×6
KIT BASIN OR (CUSTOM PROCEDURE TRAY) ×3 IMPLANT
KIT ROOM TURNOVER OR (KITS) ×3 IMPLANT
NS IRRIG 1000ML POUR BTL (IV SOLUTION) ×3 IMPLANT
PACK CV ACCESS (CUSTOM PROCEDURE TRAY) ×3 IMPLANT
PAD ARMBOARD 7.5X6 YLW CONV (MISCELLANEOUS) ×6 IMPLANT
SUT PROLENE 5 0 C 1 24 (SUTURE) ×6 IMPLANT
SUT PROLENE 6 0 CC (SUTURE) ×3 IMPLANT
SUT VIC AB 3-0 SH 27 (SUTURE) ×2
SUT VIC AB 3-0 SH 27X BRD (SUTURE) ×1 IMPLANT
TOWEL GREEN STERILE (TOWEL DISPOSABLE) ×3 IMPLANT
UNDERPAD 30X30 (UNDERPADS AND DIAPERS) ×3 IMPLANT
WATER STERILE IRR 1000ML POUR (IV SOLUTION) ×3 IMPLANT

## 2017-08-03 NOTE — Anesthesia Procedure Notes (Addendum)
Procedure Name: Intubation Date/Time: 08/03/2017 10:28 AM Performed by: Josie Dixon, CRNA Pre-anesthesia Checklist: Patient identified, Emergency Drugs available, Suction available and Patient being monitored Patient Re-evaluated:Patient Re-evaluated prior to induction Oxygen Delivery Method: Circle system utilized Preoxygenation: Pre-oxygenation with 100% oxygen Induction Type: IV induction Ventilation: Mask ventilation without difficulty and Mask ventilation throughout procedure Laryngoscope Size: Mac and 4 Grade View: Grade II Tube type: Oral Tube size: 8.0 mm Number of attempts: 1 Airway Equipment and Method: Patient positioned with wedge pillow and Stylet Placement Confirmation: ETT inserted through vocal cords under direct vision,  breath sounds checked- equal and bilateral and positive ETCO2 Secured at: 22 cm Tube secured with: Tape Dental Injury: Teeth and Oropharynx as per pre-operative assessment

## 2017-08-03 NOTE — Transfer of Care (Signed)
Immediate Anesthesia Transfer of Care Note  Patient: Joshua [name].  Procedure(s) Performed: FISTULA PLICATION LEFT UPPER ARM ANEURYSM (Left Arm Upper)  Patient Location: PACU  Anesthesia Type:General  Level of Consciousness: Drowsy, responsive.  Airway & Oxygen Therapy: spontaneous breathing with 2L O2 via face mask  Post-op Assessment: Report given to RN and Post -op Vital signs reviewed and stable  Post vital signs: Reviewed and stable  Last Vitals:  Vitals:   08/03/17 0750 08/03/17 1212  BP: (!) 178/103 126/68  Pulse:  86  Resp:  17  Temp:  (!) (P) 36.1 C  SpO2:  98%    Last Pain:  Vitals:   08/03/17 0647  TempSrc: Oral      Patients Stated Pain Goal: 3 (78/47/84 1282)  Complications: No apparent anesthesia complications

## 2017-08-03 NOTE — Op Note (Signed)
    OPERATIVE REPORT  DATE OF SURGERY: 08/03/2017  PATIENT: Joshua Patch., 37 y.o. 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  PHYSICIAN ASSISTANT: Liana Crocker PA-C  ANESTHESIA: General  EBL: Minimal ml  Total I/O In: 350 [I.V.:300; IV Piggyback:50] Out: 90 [Blood:90]  BLOOD ADMINISTERED: None  DRAINS: None  SPECIMEN: None  COUNTS CORRECT:  YES  PLAN OF CARE: PACU  PATIENT DISPOSITION:  PACU - hemodynamically stable  PROCEDURE DETAILS: Patient has long-standing history of end-stage renal disease with access via a left upper arm brachiocephalic fistula.  He has had progressive aneurysmal degeneration of the fistula.  He continues to have good flow through this and has had no evidence of skin breakdown or disruption.  He had seen Dr. Scot Dock in our office and it was recommended that he undergo plication of the more distal segment first and then after this heals plication of the more proximal segment.  The patient was placed in supine position in the area of the left arm prepped and draped in usual sterile fashion.  An incision was made over the aneurysmal segment near the antecubital space.  The patient did have redundancy of the fistula.  There was a large saccular aneurysmal component of this.  The vein was isolated to the level of the brachial artery anastomosis.  The vein was tortuous and segment was not aneurysmal.  There was a segment between the 2 large aneurysms it was not aneurysmal in the mid upper arm.  The aneurysmal portion was mobilized circumferentially.  The vein was occluded near the brachial artery anastomosis and above the aneurysm.  The aneurysm was  resected and the vein was left long enough to sew the vein into into itself with a running 5-0 Prolene suture.  Clamps were removed and excellent flow was noted.  The patient did have some redundancy of the skin over the most aneurysmal segment of the fistula.  This was resected.  The wounds were irrigated with saline and hemostasis was obtained electrocautery.  The wounds were closed with 3-0 Vicryl in the subcuticular tissue.  Sterile dressing was applied and the patient was transferred to the recovery room in stable condition.  He will be seen in the office in 13month at which time he may begin to use the lower segment and have resection of the upper segment of his fistula per his original plan   Joshua [name], M.D., [facility] 08/03/2017 1:51 PM

## 2017-08-03 NOTE — H&P (Signed)
Office Visit   06/10/2017 [facility], Judeth Cornfield, MD  Vascular Surgery   ESRD on dialysis [facility])  Dx   New Patient (Initial Visit)   ; Referred by Mauricia Area, MD  Reason for Visit   Additional Documentation   Vitals:   BP 205/123 Abnormal      Pulse 108 Abnormal     Temp 98.5 F (36.9 C) (Oral)   Resp 20   Ht 5' 11.5" (1.816 m)   Wt 275 lb (124.7 kg)   SpO2 98%   BMI 37.82 kg/m   BSA 2.51 m      More Vitals   Flowsheets:   Clinical Intake,   Vital Signs,   MEWS Score,   Anthropometrics     Encounter Info:   Billing Info,   History,   Allergies,   Detailed Report     All Notes   Progress Notes by Angelia Mould, MD at 06/10/2017 9:30 AM   Author: Angelia Mould, MD Author Type: Physician Filed: 06/10/2017 9:51 AM  Note Status: Signed Cosign: Cosign Not Required Encounter Date: 06/10/2017  Editor: Angelia Mould, MD (Physician)      Patient name: Joshua [name]  MRN: [id number]        DOB: 1980/12/24            Sex: male  REASON FOR VISIT:   To evaluate for plication of large aneurysms of left AV fistula the consult is requested by Dr. Jimmy Footman.  HPI:   Joshua [name] is a pleasant 37 y.o. male who is had a left brachiocephalic fistula for 4-5 years.  He has developed 2 very large aneurysms and is sent for evaluation for plication.  He dialyzes on Tuesdays Thursdays and Saturdays.  He tells me that the fistula has been working well.  He denies any uremic symptoms.  Specifically, he denies nausea, vomiting, fatigue, anorexia, or palpitations.  His blood pressure was high today but he had not taken his medications this morning.      Past Medical History:  Diagnosis Date  . Anemia of chronic disease   . Arthritis   . Chronic combined systolic and diastolic heart failure ([place])    a.  Echo (12/2012):  Somewhat bright myocardium, moderate to severe LVH, EF 30%, diffuse HK,  diastolic dysfunction, mild AI, moderate to severe LAE, moderate to severe RV, moderate to severe reduced RVSF, mild RAE, PASP 38 mm Hg.  . ESRD (end stage renal disease) ([place])    hemodialysis Tues, Thurs, Sat  . Headache(784.0)    Regular  . Hx of cardiac catheterization    a. Nuclear (04/2013):  Mild inferolateral ischemia, EF 46%;  b. R/L HC (04/2013):  No CAD, RA 5, RV 44/3/8, PA 43/16 (mean 28), PCWP 15  . Hypertensive cardiovascular disease    Renal Artery [place] (09/2012): No evidence of renal artery stenosis  . NICM (nonischemic cardiomyopathy) ([place])   . Sleep apnea   . Type II diabetes mellitus ([facility])          Family History  Problem Relation Age of Onset  . Diabetes Father   . Hypertension Father   . Hypertension Maternal Grandmother   . Hypertension Maternal Grandfather   . Kidney disease Maternal Grandfather   . Diabetes Maternal Grandfather     SOCIAL HISTORY: Social History        Tobacco Use  . Smoking status: Former Smoker    Years: 0.10  Types: Cigarettes    Last attempt to quit: 09/24/1999    Years since quitting: 17.7  . Smokeless tobacco: Never Used  Substance Use Topics  . Alcohol use: No         Allergies  Allergen Reactions  . Isosorbide Dinitrate     Severe headaches with po nitrates (imdur, isordil), do not prescribe  . Nitrates, Organic     Severe headaches with po nitrates (imdur, isordil, etc), do not prescribe          Current Outpatient Medications  Medication Sig Dispense Refill  . aspirin 325 MG tablet Take 325 mg by mouth daily.    . calcium acetate (PHOSLO) 667 MG capsule Take by mouth.    . carvedilol (COREG) 12.5 MG tablet Take 25 mg by mouth 2 (two) times daily with a meal. Take two tablets twice daily with a meal    . glimepiride (AMARYL) 4 MG tablet     . JANUVIA 100 MG tablet     . Multiple Vitamin (MULTIVITAMIN WITH MINERALS) TABS tablet Take 1 tablet by mouth daily.    .  hydrALAZINE (APRESOLINE) 25 MG tablet Take 1 tablet (25 mg total) by mouth 3 (three) times daily. (Patient not taking: Reported on 06/10/2017) 90 tablet 11  . minoxidil (LONITEN) 2.5 MG tablet Take 2 tablets (5 mg total) by mouth 2 (two) times daily. (Patient not taking: Reported on 06/10/2017) 120 tablet 0   No current facility-administered medications for this visit.     REVIEW OF SYSTEMS:  [X]  denotes positive finding, [ ]  denotes negative finding Cardiac  Comments:  Chest pain or chest pressure:    Shortness of breath upon exertion:    Short of breath when lying flat:    Irregular heart rhythm:        Vascular    Pain in calf, thigh, or hip brought on by ambulation:    Pain in feet at night that wakes you up from your sleep:     Blood clot in your veins:    Leg swelling:         Pulmonary    Oxygen at home:    Productive cough:     Wheezing:         Neurologic    Sudden weakness in arms or legs:     Sudden numbness in arms or legs:     Sudden onset of difficulty speaking or slurred speech:    Temporary loss of vision in one eye:     Problems with dizziness:         Gastrointestinal    Blood in stool:     Vomited blood:         Genitourinary    Burning when urinating:     Blood in urine:        Psychiatric    Major depression:         Hematologic    Bleeding problems:    Problems with blood clotting too easily:        Skin    Rashes or ulcers:        Constitutional    Fever or chills:     PHYSICAL EXAM:       Vitals:   06/10/17 0924 06/10/17 0926  BP: (!) 189/124 (!) 205/123  Pulse: (!) 108   Resp: 20   Temp: 98.5 F (36.9 C)   TempSrc: Oral   SpO2: 98%   Weight: 275 lb (124.7 kg)  Height: 5' 11.5" (1.816 m)     GENERAL: The patient is a well-nourished male, in no acute distress. The vital signs are documented above. CARDIAC: There is a  regular rate and rhythm.  VASCULAR: He has palpable radial pulses. He has 2 very large aneurysms in his left upper arm fistula.  The one adjacent to the antecubital level is the larger of the 2.  The fistula has a bruit and a thrill. PULMONARY: There is good air exchange bilaterally without wheezing or rales. MUSCULOSKELETAL: There are no major deformities or cyanosis. NEUROLOGIC: No focal weakness or paresthesias are detected. SKIN: There are no ulcers or rashes noted. PSYCHIATRIC: The patient has a normal affect.  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  I explained that the options are to try to plicate these in a staged fashion versus abandoning this fistula and working on new access.  He would like to try to salvage this graft if at all possible I think this is reasonable.  However, the aneurysms are quite large and this will have to be done in a staged fashion and will be technically challenging given the size of the aneurysms.  I recommended that we address the larger aneurysm adjacent to the antecubital level first so that they will still have room to cannulate his fistula further centrally.  Once the new area is ready for cannulation we could address the second large aneurysm.  This has been scheduled for 06/19/2017.  I have reviewed the procedure and the potential complications and he is agreeable to proceed.  HYPERTENSION: The patient's initial blood pressure today was elevated. We repeated this and this was still elevated. We have encouraged the patient to follow up with their primary care physician for management of their blood pressure.   [name] [facility] [phone]    Addendum:  The patient has been re-examined and re-evaluated.  The patient's history and physical has been reviewed and is unchanged.     Joshua Rinck. is a 37 y.o. male is being admitted with Left arm fistula aneurysm. All the risks, benefits and other treatment options have been discussed with the patient. The patient has consented to proceed with Procedure(s): FISTULA PLICATION LEFT ARM ANEURYSM as a surgical intervention.  Joshua [name] 08/03/2017 7:22 AM [facility]

## 2017-08-03 NOTE — Anesthesia Postprocedure Evaluation (Signed)
Anesthesia Post Note  Patient: Maveryck Bahri.  Procedure(s) Performed: FISTULA PLICATION LEFT UPPER ARM ANEURYSM (Left Arm Upper)     Patient location during evaluation: PACU Anesthesia Type: General Level of consciousness: awake and alert and oriented Pain management: pain level controlled Vital Signs Assessment: post-procedure vital signs reviewed and stable Respiratory status: spontaneous breathing, nonlabored ventilation and respiratory function stable Cardiovascular status: blood pressure returned to baseline and stable Postop Assessment: no apparent nausea or vomiting Anesthetic complications: no    Last Vitals:  Vitals:   08/03/17 1345 08/03/17 1353  BP: (!) 155/106 (!) 160/108  Pulse: 86 84  Resp: (!) 22 (!) 23  Temp: (!) 36.3 C   SpO2: 95% 97%    Last Pain:  Vitals:   08/03/17 0647  TempSrc: Oral                 Roniesha Hollingshead A.

## 2017-08-03 NOTE — Anesthesia Preprocedure Evaluation (Signed)
Anesthesia Evaluation  Patient identified by MRN, date of birth, ID band Patient awake    Reviewed: Allergy & Precautions, NPO status , Patient's Chart, lab work & pertinent test results, reviewed documented beta blocker date and time   Airway Mallampati: I  TM Distance: >3 FB Neck ROM: Full    Dental no notable dental hx. (+) Teeth Intact   Pulmonary sleep apnea , former smoker,    Pulmonary exam normal breath sounds clear to auscultation       Cardiovascular hypertension, Pt. on medications and Pt. on home beta blockers +CHF  Normal cardiovascular exam Rhythm:Regular Rate:Normal  Pseudoaneurysm left upper extremity AV Fistula   Neuro/Psych  Headaches, PSYCHIATRIC DISORDERS Anxiety    GI/Hepatic GERD  Medicated and Controlled,  Endo/Other  diabetes, Poorly Controlled, Type 2, Oral Hypoglycemic AgentsMorbid obesity  Renal/GU ESRF and DialysisRenal disease  negative genitourinary   Musculoskeletal  (+) Arthritis , Osteoarthritis,    Abdominal (+) + obese,   Peds  Hematology  (+) anemia ,   Anesthesia Other Findings   Reproductive/Obstetrics                             Anesthesia Physical Anesthesia Plan  ASA: IV  Anesthesia Plan: General   Post-op Pain Management:    Induction: Intravenous  PONV Risk Score and Plan: 3 and Midazolam, Ondansetron and Dexamethasone  Airway Management Planned: Oral ETT and LMA  Additional Equipment:   Intra-op Plan:   Post-operative Plan: Extubation in OR  Informed Consent: I have reviewed the patients History and Physical, chart, labs and discussed the procedure including the risks, benefits and alternatives for the proposed anesthesia with the patient or authorized representative who has indicated his/her understanding and acceptance.   Dental advisory given  Plan Discussed with: CRNA, Anesthesiologist and Surgeon  Anesthesia Plan Comments:          Anesthesia Quick Evaluation

## 2017-08-03 NOTE — Discharge Instructions (Signed)
[facility]  Discharge Instructions  AV Fistula or Graft Surgery for Dialysis Access  Please refer to the following instructions for your post-procedure care. Your surgeon or physician assistant will discuss any changes with you.  Activity  You may drive the day following your surgery, if you are comfortable and no longer taking prescription pain medication. Resume full activity as the soreness in your incision resolves.  Bathing/Showering  You may shower after you go home. Keep your incision dry for 48 hours. Do not soak in a bathtub, hot tub, or swim until the incision heals completely. You may not shower if you have a hemodialysis catheter.  Incision Care  Clean your incision with mild soap and water after 48 hours. Pat the area dry with a clean towel. You do not need a bandage unless otherwise instructed. Do not apply any ointments or creams to your incision. You may have skin glue on your incision. Do not peel it off. It will come off on its own in about one week. Your arm may swell a bit after surgery. To reduce swelling use pillows to elevate your arm so it is above your heart. Your doctor will tell you if you need to lightly wrap your arm with an ACE bandage.  Diet  Resume your normal diet. There are not special food restrictions following this procedure. In order to heal from your surgery, it is CRITICAL to get adequate nutrition. Your body requires vitamins, minerals, and protein. Vegetables are the best source of vitamins and minerals. Vegetables also provide the perfect balance of protein. Processed food has little nutritional value, so try to avoid this.  Medications  Resume taking all of your medications. If your incision is causing pain, you may take over-the counter pain relievers such as acetaminophen (Tylenol). If you were prescribed a stronger pain medication, please be aware these medications can cause nausea and constipation. Prevent  nausea by taking the medication with a snack or meal. Avoid constipation by drinking plenty of fluids and eating foods with high amount of fiber, such as fruits, vegetables, and grains.  Do not take Tylenol if you are taking prescription pain medications.  Follow up Your surgeon may want to see you in the office following your access surgery. If so, this will be arranged at the time of your surgery.  Please call us immediately for any of the following conditions:  Increased pain, redness, drainage (pus) from your incision site Fever of 101 degrees or higher Severe or worsening pain at your incision site Hand pain or numbness.  Reduce your risk of vascular disease:  Stop smoking. If you would like help, call [facility] at 1-800-QUIT-NOW 4048015538) or [place] at [place] your cholesterol Maintain a desired weight Control your diabetes Keep your blood pressure down  Dialysis  It will take several weeks to several months for your new dialysis access to be ready for use. Your surgeon will determine when it is okay to use it. Your nephrologist will continue to direct your dialysis. You can continue to use your Permcath until your new access is ready for use.   08/03/2017 Joshua [name] [id number] 1981-02-02  Surgeon(s): Early, Arvilla Meres, MD  Procedure(s): FISTULA PLICATION LEFT UPPER ARM ANEURYSM  x May stick graft on designated area only:  Do NOT stick over incision for 4 weeks. May stick above incision now. SEE DIAGRAM.   If you have any questions, please call the office at [phone].

## 2017-08-04 ENCOUNTER — Telehealth: Payer: Self-pay | Admitting: Vascular Surgery

## 2017-08-04 ENCOUNTER — Encounter (HOSPITAL_COMMUNITY): Payer: Self-pay | Admitting: Vascular Surgery

## 2017-08-04 DIAGNOSIS — E1129 Type 2 diabetes mellitus with other diabetic kidney complication: Secondary | ICD-10-CM | POA: Diagnosis not present

## 2017-08-04 DIAGNOSIS — N186 End stage renal disease: Secondary | ICD-10-CM | POA: Diagnosis not present

## 2017-08-04 DIAGNOSIS — E119 Type 2 diabetes mellitus without complications: Secondary | ICD-10-CM | POA: Diagnosis not present

## 2017-08-04 DIAGNOSIS — N2581 Secondary hyperparathyroidism of renal origin: Secondary | ICD-10-CM | POA: Diagnosis not present

## 2017-08-04 DIAGNOSIS — A499 Bacterial infection, unspecified: Secondary | ICD-10-CM | POA: Diagnosis not present

## 2017-08-04 DIAGNOSIS — D509 Iron deficiency anemia, unspecified: Secondary | ICD-10-CM | POA: Diagnosis not present

## 2017-08-04 NOTE — Telephone Encounter (Signed)
Unable to leave message on pt phone re 04/10 appt. Mailed letter.

## 2017-08-04 NOTE — Telephone Encounter (Signed)
-----   Message from Mena Goes, RN sent at 08/03/2017  2:32 PM EDT ----- Regarding: 2-3 weeks to discuss surgery   ----- Message ----- From: Gabriel Earing, PA-C Sent: 08/03/2017  11:54 AM To: [facility] Charge Pool  S/p revision of left arm fistula 08/03/17 by Dr. Donnetta Hutching (set up by [facility]).  Given the pt dialyzes T/T/S, please have pt come back to see Dr. Scot Dock in 2-3 weeks.  He will also need to be set up for more surgery so needs to be on [facility]'s schedule.   Thanks

## 2017-08-05 ENCOUNTER — Ambulatory Visit: Payer: Medicare Other | Admitting: *Deleted

## 2017-08-05 DIAGNOSIS — E119 Type 2 diabetes mellitus without complications: Secondary | ICD-10-CM | POA: Diagnosis not present

## 2017-08-05 DIAGNOSIS — N2581 Secondary hyperparathyroidism of renal origin: Secondary | ICD-10-CM | POA: Diagnosis not present

## 2017-08-05 DIAGNOSIS — D509 Iron deficiency anemia, unspecified: Secondary | ICD-10-CM | POA: Diagnosis not present

## 2017-08-05 DIAGNOSIS — N186 End stage renal disease: Secondary | ICD-10-CM | POA: Diagnosis not present

## 2017-08-05 DIAGNOSIS — A499 Bacterial infection, unspecified: Secondary | ICD-10-CM | POA: Diagnosis not present

## 2017-08-05 DIAGNOSIS — Z4801 Encounter for change or removal of surgical wound dressing: Secondary | ICD-10-CM

## 2017-08-05 DIAGNOSIS — E1129 Type 2 diabetes mellitus with other diabetic kidney complication: Secondary | ICD-10-CM | POA: Diagnosis not present

## 2017-08-05 NOTE — Progress Notes (Signed)
Patient came by today at the request of Ferol Luz, PA for [facility]. He was at HD today ([place]) and had some problems with his dressings. Per Ria Comment, patient is afebrile and had no issues with his HD session this morning.Patient had fistula plication left upper arm by Dr. Donnetta Hutching on 08-03-17.   He was seen for dressing change, reportedly this has not been changed since he left the hospital. Incision is intact with normal serosanguinous drainage noted. I cleaned the area and wrapped it with an ABD pad, then used loose ACE wrap to cover the incision. I then discussed wound cleansing and care with the patient. I also instructed him of dressing changes including the proper use of loose Ace wrap. I went over S&S of infection and possible steal syndrome. Joshua [name] voiced understanding of all this information, he used the teach back method to reinforce my instructions. He will keep his regular postop appt with Dr. Scot Dock.

## 2017-08-06 DIAGNOSIS — A499 Bacterial infection, unspecified: Secondary | ICD-10-CM | POA: Diagnosis not present

## 2017-08-06 DIAGNOSIS — E1129 Type 2 diabetes mellitus with other diabetic kidney complication: Secondary | ICD-10-CM | POA: Diagnosis not present

## 2017-08-06 DIAGNOSIS — E119 Type 2 diabetes mellitus without complications: Secondary | ICD-10-CM | POA: Diagnosis not present

## 2017-08-06 DIAGNOSIS — N2581 Secondary hyperparathyroidism of renal origin: Secondary | ICD-10-CM | POA: Diagnosis not present

## 2017-08-06 DIAGNOSIS — D509 Iron deficiency anemia, unspecified: Secondary | ICD-10-CM | POA: Diagnosis not present

## 2017-08-06 DIAGNOSIS — N186 End stage renal disease: Secondary | ICD-10-CM | POA: Diagnosis not present

## 2017-08-08 DIAGNOSIS — N186 End stage renal disease: Secondary | ICD-10-CM | POA: Diagnosis not present

## 2017-08-08 DIAGNOSIS — A499 Bacterial infection, unspecified: Secondary | ICD-10-CM | POA: Diagnosis not present

## 2017-08-08 DIAGNOSIS — E1129 Type 2 diabetes mellitus with other diabetic kidney complication: Secondary | ICD-10-CM | POA: Diagnosis not present

## 2017-08-08 DIAGNOSIS — E119 Type 2 diabetes mellitus without complications: Secondary | ICD-10-CM | POA: Diagnosis not present

## 2017-08-08 DIAGNOSIS — D509 Iron deficiency anemia, unspecified: Secondary | ICD-10-CM | POA: Diagnosis not present

## 2017-08-08 DIAGNOSIS — N2581 Secondary hyperparathyroidism of renal origin: Secondary | ICD-10-CM | POA: Diagnosis not present

## 2017-08-13 DIAGNOSIS — N2581 Secondary hyperparathyroidism of renal origin: Secondary | ICD-10-CM | POA: Diagnosis not present

## 2017-08-13 DIAGNOSIS — E119 Type 2 diabetes mellitus without complications: Secondary | ICD-10-CM | POA: Diagnosis not present

## 2017-08-13 DIAGNOSIS — A499 Bacterial infection, unspecified: Secondary | ICD-10-CM | POA: Diagnosis not present

## 2017-08-13 DIAGNOSIS — E1129 Type 2 diabetes mellitus with other diabetic kidney complication: Secondary | ICD-10-CM | POA: Diagnosis not present

## 2017-08-13 DIAGNOSIS — D509 Iron deficiency anemia, unspecified: Secondary | ICD-10-CM | POA: Diagnosis not present

## 2017-08-13 DIAGNOSIS — N186 End stage renal disease: Secondary | ICD-10-CM | POA: Diagnosis not present

## 2017-08-15 DIAGNOSIS — N2581 Secondary hyperparathyroidism of renal origin: Secondary | ICD-10-CM | POA: Diagnosis not present

## 2017-08-15 DIAGNOSIS — E119 Type 2 diabetes mellitus without complications: Secondary | ICD-10-CM | POA: Diagnosis not present

## 2017-08-15 DIAGNOSIS — D509 Iron deficiency anemia, unspecified: Secondary | ICD-10-CM | POA: Diagnosis not present

## 2017-08-15 DIAGNOSIS — N186 End stage renal disease: Secondary | ICD-10-CM | POA: Diagnosis not present

## 2017-08-15 DIAGNOSIS — E1129 Type 2 diabetes mellitus with other diabetic kidney complication: Secondary | ICD-10-CM | POA: Diagnosis not present

## 2017-08-15 DIAGNOSIS — A499 Bacterial infection, unspecified: Secondary | ICD-10-CM | POA: Diagnosis not present

## 2017-08-18 DIAGNOSIS — N2581 Secondary hyperparathyroidism of renal origin: Secondary | ICD-10-CM | POA: Diagnosis not present

## 2017-08-18 DIAGNOSIS — A499 Bacterial infection, unspecified: Secondary | ICD-10-CM | POA: Diagnosis not present

## 2017-08-18 DIAGNOSIS — N186 End stage renal disease: Secondary | ICD-10-CM | POA: Diagnosis not present

## 2017-08-18 DIAGNOSIS — D509 Iron deficiency anemia, unspecified: Secondary | ICD-10-CM | POA: Diagnosis not present

## 2017-08-18 DIAGNOSIS — E119 Type 2 diabetes mellitus without complications: Secondary | ICD-10-CM | POA: Diagnosis not present

## 2017-08-18 DIAGNOSIS — E1129 Type 2 diabetes mellitus with other diabetic kidney complication: Secondary | ICD-10-CM | POA: Diagnosis not present

## 2017-08-20 DIAGNOSIS — N186 End stage renal disease: Secondary | ICD-10-CM | POA: Diagnosis not present

## 2017-08-20 DIAGNOSIS — E119 Type 2 diabetes mellitus without complications: Secondary | ICD-10-CM | POA: Diagnosis not present

## 2017-08-20 DIAGNOSIS — D509 Iron deficiency anemia, unspecified: Secondary | ICD-10-CM | POA: Diagnosis not present

## 2017-08-20 DIAGNOSIS — N2581 Secondary hyperparathyroidism of renal origin: Secondary | ICD-10-CM | POA: Diagnosis not present

## 2017-08-20 DIAGNOSIS — A499 Bacterial infection, unspecified: Secondary | ICD-10-CM | POA: Diagnosis not present

## 2017-08-20 DIAGNOSIS — E1129 Type 2 diabetes mellitus with other diabetic kidney complication: Secondary | ICD-10-CM | POA: Diagnosis not present

## 2017-08-22 DIAGNOSIS — N2581 Secondary hyperparathyroidism of renal origin: Secondary | ICD-10-CM | POA: Diagnosis not present

## 2017-08-22 DIAGNOSIS — A499 Bacterial infection, unspecified: Secondary | ICD-10-CM | POA: Diagnosis not present

## 2017-08-22 DIAGNOSIS — E1129 Type 2 diabetes mellitus with other diabetic kidney complication: Secondary | ICD-10-CM | POA: Diagnosis not present

## 2017-08-22 DIAGNOSIS — N186 End stage renal disease: Secondary | ICD-10-CM | POA: Diagnosis not present

## 2017-08-22 DIAGNOSIS — E119 Type 2 diabetes mellitus without complications: Secondary | ICD-10-CM | POA: Diagnosis not present

## 2017-08-22 DIAGNOSIS — D509 Iron deficiency anemia, unspecified: Secondary | ICD-10-CM | POA: Diagnosis not present

## 2017-08-24 DIAGNOSIS — N186 End stage renal disease: Secondary | ICD-10-CM | POA: Diagnosis not present

## 2017-08-24 DIAGNOSIS — I120 Hypertensive chronic kidney disease with stage 5 chronic kidney disease or end stage renal disease: Secondary | ICD-10-CM | POA: Diagnosis not present

## 2017-08-24 DIAGNOSIS — Z992 Dependence on renal dialysis: Secondary | ICD-10-CM | POA: Diagnosis not present

## 2017-08-25 DIAGNOSIS — N186 End stage renal disease: Secondary | ICD-10-CM | POA: Diagnosis not present

## 2017-08-25 DIAGNOSIS — D509 Iron deficiency anemia, unspecified: Secondary | ICD-10-CM | POA: Diagnosis not present

## 2017-08-25 DIAGNOSIS — N2581 Secondary hyperparathyroidism of renal origin: Secondary | ICD-10-CM | POA: Diagnosis not present

## 2017-08-25 DIAGNOSIS — E119 Type 2 diabetes mellitus without complications: Secondary | ICD-10-CM | POA: Diagnosis not present

## 2017-08-27 DIAGNOSIS — D509 Iron deficiency anemia, unspecified: Secondary | ICD-10-CM | POA: Diagnosis not present

## 2017-08-27 DIAGNOSIS — N186 End stage renal disease: Secondary | ICD-10-CM | POA: Diagnosis not present

## 2017-08-27 DIAGNOSIS — E119 Type 2 diabetes mellitus without complications: Secondary | ICD-10-CM | POA: Diagnosis not present

## 2017-08-27 DIAGNOSIS — N2581 Secondary hyperparathyroidism of renal origin: Secondary | ICD-10-CM | POA: Diagnosis not present

## 2017-08-29 DIAGNOSIS — N186 End stage renal disease: Secondary | ICD-10-CM | POA: Diagnosis not present

## 2017-08-29 DIAGNOSIS — N2581 Secondary hyperparathyroidism of renal origin: Secondary | ICD-10-CM | POA: Diagnosis not present

## 2017-08-29 DIAGNOSIS — E119 Type 2 diabetes mellitus without complications: Secondary | ICD-10-CM | POA: Diagnosis not present

## 2017-08-29 DIAGNOSIS — D509 Iron deficiency anemia, unspecified: Secondary | ICD-10-CM | POA: Diagnosis not present

## 2017-09-01 DIAGNOSIS — D509 Iron deficiency anemia, unspecified: Secondary | ICD-10-CM | POA: Diagnosis not present

## 2017-09-01 DIAGNOSIS — N186 End stage renal disease: Secondary | ICD-10-CM | POA: Diagnosis not present

## 2017-09-01 DIAGNOSIS — E119 Type 2 diabetes mellitus without complications: Secondary | ICD-10-CM | POA: Diagnosis not present

## 2017-09-01 DIAGNOSIS — N2581 Secondary hyperparathyroidism of renal origin: Secondary | ICD-10-CM | POA: Diagnosis not present

## 2017-09-02 ENCOUNTER — Ambulatory Visit (INDEPENDENT_AMBULATORY_CARE_PROVIDER_SITE_OTHER): Payer: Medicare Other | Admitting: Vascular Surgery

## 2017-09-02 ENCOUNTER — Encounter: Payer: Self-pay | Admitting: Vascular Surgery

## 2017-09-02 VITALS — BP 160/97 | HR 95 | Temp 98.1°F | Resp 18 | Ht 70.0 in | Wt 276.0 lb

## 2017-09-02 DIAGNOSIS — N186 End stage renal disease: Secondary | ICD-10-CM

## 2017-09-02 DIAGNOSIS — Z992 Dependence on renal dialysis: Secondary | ICD-10-CM

## 2017-09-02 NOTE — Progress Notes (Signed)
Patient name: Joshua [name]. MRN: [id number] DOB: [birth year] Sex: male  REASON FOR VISIT:   Follow-up after revision of left arm fistula.  HPI:   Joshua [name]. is a pleasant 37 y.o. male who I had seen on 06/10/2017 with a large aneurysmal left upper arm fistula.  This patient had 2 very large aneurysms in the left upper arm fistula.  We discussed plicating these in a staged fashion versus abandoning of the fistula and working on new access.  He wished to try to salvage the fistula if at all possible.  On 08/03/2017 the patient underwent revision of the left brachiocephalic fistula by Dr. Curt Jews.  He resected the lower aneurysmal segment with end to end anastomosis.  Patient returns for follow-up visit.  He states that he had some wound separation but that this has improved significantly.  He is keen he had a dressing over the small area of drainage just above the antecubital level.  He does home dialysis and states that the fistula is working well.  Current Outpatient Medications  Medication Sig Dispense Refill  . aspirin 81 MG tablet Take 81 mg by mouth daily.     . calcium acetate (PHOSLO) 667 MG capsule Take 1,334 mg by mouth 3 (three) times daily with meals.     . carvedilol (COREG) 12.5 MG tablet Take 25 mg by mouth 2 (two) times daily with a meal.     . cinacalcet (SENSIPAR) 30 MG tablet Take 60 mg by mouth daily.    Marland Kitchen glimepiride (AMARYL) 4 MG tablet Take 8 mg by mouth 2 (two) times daily.     Marland Kitchen JANUVIA 100 MG tablet Take 100 mg by mouth daily.     . minoxidil (LONITEN) 2.5 MG tablet Take 2 tablets (5 mg total) by mouth 2 (two) times daily. 120 tablet 0  . multivitamin (RENA-VIT) TABS tablet Take 1 tablet by mouth daily.    Marland Kitchen oxyCODONE-acetaminophen (PERCOCET) 5-325 MG tablet Take 1 tablet by mouth every 6 (six) hours as needed for severe pain. 12 tablet 0  . promethazine (PHENERGAN) 12.5 MG tablet Take 12.5 mg by mouth every 6 (six) hours as needed for nausea or  vomiting.    . hydrALAZINE (APRESOLINE) 25 MG tablet Take 1 tablet (25 mg total) by mouth 3 (three) times daily. (Patient not taking: Reported on 07/31/2017) 90 tablet 11   No current facility-administered medications for this visit.     REVIEW OF SYSTEMS:  [X]  denotes positive finding, [ ]  denotes negative finding Cardiac  Comments:  Chest pain or chest pressure:    Shortness of breath upon exertion:    Short of breath when lying flat:    Irregular heart rhythm:    Constitutional    Fever or chills:     PHYSICAL EXAM:   Vitals:   09/02/17 1200  BP: (!) 160/97  Pulse: 95  Resp: 18  Temp: 98.1 F (36.7 C)  TempSrc: Oral  SpO2: 98%  Weight: 276 lb (125.2 kg)  Height: 5\' 10"  (1.778 m)    GENERAL: The patient is a well-nourished male, in no acute distress. The vital signs are documented above. CARDIOVASCULAR: There is a regular rate and rhythm. PULMONARY: There is good air exchange bilaterally without wheezing or rales. He has a large aneurysmal fistula throughout his entire upper arm.  The incision from his recent surgery has almost completely healed.  DATA:   No new data.  MEDICAL ISSUES:   END-STAGE  RENAL DISEASE: At this point I would recommend placing new access in the right arm and continuing to use the fistula left arm until his new access is ready.  I really am hesitant to consider operating again as I am not sure that this fistula in the left arm is salvageable.  The entire fistula is extremely large and aneurysmal.  He feels strongly about trying to continue to use this as long as possible.  Therefore he will continue to use the fistula and if he decides to allow [place] to attempt new access in the right arm then certainly we can arrange that after preoperative evaluation.  [name] [facility] [phone]

## 2017-09-03 DIAGNOSIS — E119 Type 2 diabetes mellitus without complications: Secondary | ICD-10-CM | POA: Diagnosis not present

## 2017-09-03 DIAGNOSIS — E1129 Type 2 diabetes mellitus with other diabetic kidney complication: Secondary | ICD-10-CM | POA: Diagnosis not present

## 2017-09-03 DIAGNOSIS — D509 Iron deficiency anemia, unspecified: Secondary | ICD-10-CM | POA: Diagnosis not present

## 2017-09-03 DIAGNOSIS — N2581 Secondary hyperparathyroidism of renal origin: Secondary | ICD-10-CM | POA: Diagnosis not present

## 2017-09-03 DIAGNOSIS — N186 End stage renal disease: Secondary | ICD-10-CM | POA: Diagnosis not present

## 2017-09-05 DIAGNOSIS — D509 Iron deficiency anemia, unspecified: Secondary | ICD-10-CM | POA: Diagnosis not present

## 2017-09-05 DIAGNOSIS — E119 Type 2 diabetes mellitus without complications: Secondary | ICD-10-CM | POA: Diagnosis not present

## 2017-09-05 DIAGNOSIS — N2581 Secondary hyperparathyroidism of renal origin: Secondary | ICD-10-CM | POA: Diagnosis not present

## 2017-09-05 DIAGNOSIS — N186 End stage renal disease: Secondary | ICD-10-CM | POA: Diagnosis not present

## 2017-09-08 DIAGNOSIS — D509 Iron deficiency anemia, unspecified: Secondary | ICD-10-CM | POA: Diagnosis not present

## 2017-09-08 DIAGNOSIS — N186 End stage renal disease: Secondary | ICD-10-CM | POA: Diagnosis not present

## 2017-09-08 DIAGNOSIS — E119 Type 2 diabetes mellitus without complications: Secondary | ICD-10-CM | POA: Diagnosis not present

## 2017-09-08 DIAGNOSIS — N2581 Secondary hyperparathyroidism of renal origin: Secondary | ICD-10-CM | POA: Diagnosis not present

## 2017-09-10 DIAGNOSIS — N2581 Secondary hyperparathyroidism of renal origin: Secondary | ICD-10-CM | POA: Diagnosis not present

## 2017-09-10 DIAGNOSIS — D509 Iron deficiency anemia, unspecified: Secondary | ICD-10-CM | POA: Diagnosis not present

## 2017-09-10 DIAGNOSIS — E119 Type 2 diabetes mellitus without complications: Secondary | ICD-10-CM | POA: Diagnosis not present

## 2017-09-10 DIAGNOSIS — N186 End stage renal disease: Secondary | ICD-10-CM | POA: Diagnosis not present

## 2017-09-12 DIAGNOSIS — N2581 Secondary hyperparathyroidism of renal origin: Secondary | ICD-10-CM | POA: Diagnosis not present

## 2017-09-12 DIAGNOSIS — D509 Iron deficiency anemia, unspecified: Secondary | ICD-10-CM | POA: Diagnosis not present

## 2017-09-12 DIAGNOSIS — N186 End stage renal disease: Secondary | ICD-10-CM | POA: Diagnosis not present

## 2017-09-12 DIAGNOSIS — E119 Type 2 diabetes mellitus without complications: Secondary | ICD-10-CM | POA: Diagnosis not present

## 2017-09-15 DIAGNOSIS — E119 Type 2 diabetes mellitus without complications: Secondary | ICD-10-CM | POA: Diagnosis not present

## 2017-09-15 DIAGNOSIS — N2581 Secondary hyperparathyroidism of renal origin: Secondary | ICD-10-CM | POA: Diagnosis not present

## 2017-09-15 DIAGNOSIS — N186 End stage renal disease: Secondary | ICD-10-CM | POA: Diagnosis not present

## 2017-09-15 DIAGNOSIS — D509 Iron deficiency anemia, unspecified: Secondary | ICD-10-CM | POA: Diagnosis not present

## 2017-09-17 DIAGNOSIS — N2581 Secondary hyperparathyroidism of renal origin: Secondary | ICD-10-CM | POA: Diagnosis not present

## 2017-09-17 DIAGNOSIS — N186 End stage renal disease: Secondary | ICD-10-CM | POA: Diagnosis not present

## 2017-09-17 DIAGNOSIS — E119 Type 2 diabetes mellitus without complications: Secondary | ICD-10-CM | POA: Diagnosis not present

## 2017-09-17 DIAGNOSIS — D509 Iron deficiency anemia, unspecified: Secondary | ICD-10-CM | POA: Diagnosis not present

## 2017-09-19 DIAGNOSIS — E119 Type 2 diabetes mellitus without complications: Secondary | ICD-10-CM | POA: Diagnosis not present

## 2017-09-19 DIAGNOSIS — N186 End stage renal disease: Secondary | ICD-10-CM | POA: Diagnosis not present

## 2017-09-19 DIAGNOSIS — N2581 Secondary hyperparathyroidism of renal origin: Secondary | ICD-10-CM | POA: Diagnosis not present

## 2017-09-19 DIAGNOSIS — D509 Iron deficiency anemia, unspecified: Secondary | ICD-10-CM | POA: Diagnosis not present

## 2017-09-22 DIAGNOSIS — E119 Type 2 diabetes mellitus without complications: Secondary | ICD-10-CM | POA: Diagnosis not present

## 2017-09-22 DIAGNOSIS — N2581 Secondary hyperparathyroidism of renal origin: Secondary | ICD-10-CM | POA: Diagnosis not present

## 2017-09-22 DIAGNOSIS — N186 End stage renal disease: Secondary | ICD-10-CM | POA: Diagnosis not present

## 2017-09-22 DIAGNOSIS — D509 Iron deficiency anemia, unspecified: Secondary | ICD-10-CM | POA: Diagnosis not present

## 2017-09-23 DIAGNOSIS — Z992 Dependence on renal dialysis: Secondary | ICD-10-CM | POA: Diagnosis not present

## 2017-09-23 DIAGNOSIS — I120 Hypertensive chronic kidney disease with stage 5 chronic kidney disease or end stage renal disease: Secondary | ICD-10-CM | POA: Diagnosis not present

## 2017-09-23 DIAGNOSIS — N186 End stage renal disease: Secondary | ICD-10-CM | POA: Diagnosis not present

## 2017-09-24 DIAGNOSIS — N186 End stage renal disease: Secondary | ICD-10-CM | POA: Diagnosis not present

## 2017-09-24 DIAGNOSIS — D509 Iron deficiency anemia, unspecified: Secondary | ICD-10-CM | POA: Diagnosis not present

## 2017-09-24 DIAGNOSIS — E119 Type 2 diabetes mellitus without complications: Secondary | ICD-10-CM | POA: Diagnosis not present

## 2017-09-24 DIAGNOSIS — N2581 Secondary hyperparathyroidism of renal origin: Secondary | ICD-10-CM | POA: Diagnosis not present

## 2017-09-24 DIAGNOSIS — D631 Anemia in chronic kidney disease: Secondary | ICD-10-CM | POA: Diagnosis not present

## 2017-09-26 DIAGNOSIS — N186 End stage renal disease: Secondary | ICD-10-CM | POA: Diagnosis not present

## 2017-09-26 DIAGNOSIS — D509 Iron deficiency anemia, unspecified: Secondary | ICD-10-CM | POA: Diagnosis not present

## 2017-09-26 DIAGNOSIS — N2581 Secondary hyperparathyroidism of renal origin: Secondary | ICD-10-CM | POA: Diagnosis not present

## 2017-09-26 DIAGNOSIS — E119 Type 2 diabetes mellitus without complications: Secondary | ICD-10-CM | POA: Diagnosis not present

## 2017-09-26 DIAGNOSIS — D631 Anemia in chronic kidney disease: Secondary | ICD-10-CM | POA: Diagnosis not present

## 2017-09-29 DIAGNOSIS — D509 Iron deficiency anemia, unspecified: Secondary | ICD-10-CM | POA: Diagnosis not present

## 2017-09-29 DIAGNOSIS — D631 Anemia in chronic kidney disease: Secondary | ICD-10-CM | POA: Diagnosis not present

## 2017-09-29 DIAGNOSIS — N186 End stage renal disease: Secondary | ICD-10-CM | POA: Diagnosis not present

## 2017-09-29 DIAGNOSIS — N2581 Secondary hyperparathyroidism of renal origin: Secondary | ICD-10-CM | POA: Diagnosis not present

## 2017-09-29 DIAGNOSIS — E119 Type 2 diabetes mellitus without complications: Secondary | ICD-10-CM | POA: Diagnosis not present

## 2017-10-01 DIAGNOSIS — N186 End stage renal disease: Secondary | ICD-10-CM | POA: Diagnosis not present

## 2017-10-01 DIAGNOSIS — D631 Anemia in chronic kidney disease: Secondary | ICD-10-CM | POA: Diagnosis not present

## 2017-10-01 DIAGNOSIS — D509 Iron deficiency anemia, unspecified: Secondary | ICD-10-CM | POA: Diagnosis not present

## 2017-10-01 DIAGNOSIS — N2581 Secondary hyperparathyroidism of renal origin: Secondary | ICD-10-CM | POA: Diagnosis not present

## 2017-10-01 DIAGNOSIS — E119 Type 2 diabetes mellitus without complications: Secondary | ICD-10-CM | POA: Diagnosis not present

## 2017-10-03 DIAGNOSIS — D631 Anemia in chronic kidney disease: Secondary | ICD-10-CM | POA: Diagnosis not present

## 2017-10-03 DIAGNOSIS — E119 Type 2 diabetes mellitus without complications: Secondary | ICD-10-CM | POA: Diagnosis not present

## 2017-10-03 DIAGNOSIS — N186 End stage renal disease: Secondary | ICD-10-CM | POA: Diagnosis not present

## 2017-10-03 DIAGNOSIS — N2581 Secondary hyperparathyroidism of renal origin: Secondary | ICD-10-CM | POA: Diagnosis not present

## 2017-10-03 DIAGNOSIS — D509 Iron deficiency anemia, unspecified: Secondary | ICD-10-CM | POA: Diagnosis not present

## 2017-10-08 DIAGNOSIS — N186 End stage renal disease: Secondary | ICD-10-CM | POA: Diagnosis not present

## 2017-10-08 DIAGNOSIS — D509 Iron deficiency anemia, unspecified: Secondary | ICD-10-CM | POA: Diagnosis not present

## 2017-10-08 DIAGNOSIS — N2581 Secondary hyperparathyroidism of renal origin: Secondary | ICD-10-CM | POA: Diagnosis not present

## 2017-10-08 DIAGNOSIS — D631 Anemia in chronic kidney disease: Secondary | ICD-10-CM | POA: Diagnosis not present

## 2017-10-08 DIAGNOSIS — E119 Type 2 diabetes mellitus without complications: Secondary | ICD-10-CM | POA: Diagnosis not present

## 2017-10-10 DIAGNOSIS — E119 Type 2 diabetes mellitus without complications: Secondary | ICD-10-CM | POA: Diagnosis not present

## 2017-10-10 DIAGNOSIS — D509 Iron deficiency anemia, unspecified: Secondary | ICD-10-CM | POA: Diagnosis not present

## 2017-10-10 DIAGNOSIS — N2581 Secondary hyperparathyroidism of renal origin: Secondary | ICD-10-CM | POA: Diagnosis not present

## 2017-10-10 DIAGNOSIS — N186 End stage renal disease: Secondary | ICD-10-CM | POA: Diagnosis not present

## 2017-10-10 DIAGNOSIS — D631 Anemia in chronic kidney disease: Secondary | ICD-10-CM | POA: Diagnosis not present

## 2017-10-13 DIAGNOSIS — N186 End stage renal disease: Secondary | ICD-10-CM | POA: Diagnosis not present

## 2017-10-13 DIAGNOSIS — D631 Anemia in chronic kidney disease: Secondary | ICD-10-CM | POA: Diagnosis not present

## 2017-10-13 DIAGNOSIS — N2581 Secondary hyperparathyroidism of renal origin: Secondary | ICD-10-CM | POA: Diagnosis not present

## 2017-10-13 DIAGNOSIS — D509 Iron deficiency anemia, unspecified: Secondary | ICD-10-CM | POA: Diagnosis not present

## 2017-10-13 DIAGNOSIS — E119 Type 2 diabetes mellitus without complications: Secondary | ICD-10-CM | POA: Diagnosis not present

## 2017-10-15 DIAGNOSIS — D509 Iron deficiency anemia, unspecified: Secondary | ICD-10-CM | POA: Diagnosis not present

## 2017-10-15 DIAGNOSIS — D631 Anemia in chronic kidney disease: Secondary | ICD-10-CM | POA: Diagnosis not present

## 2017-10-15 DIAGNOSIS — N2581 Secondary hyperparathyroidism of renal origin: Secondary | ICD-10-CM | POA: Diagnosis not present

## 2017-10-15 DIAGNOSIS — N186 End stage renal disease: Secondary | ICD-10-CM | POA: Diagnosis not present

## 2017-10-15 DIAGNOSIS — E119 Type 2 diabetes mellitus without complications: Secondary | ICD-10-CM | POA: Diagnosis not present

## 2017-10-17 DIAGNOSIS — N186 End stage renal disease: Secondary | ICD-10-CM | POA: Diagnosis not present

## 2017-10-17 DIAGNOSIS — N2581 Secondary hyperparathyroidism of renal origin: Secondary | ICD-10-CM | POA: Diagnosis not present

## 2017-10-17 DIAGNOSIS — D509 Iron deficiency anemia, unspecified: Secondary | ICD-10-CM | POA: Diagnosis not present

## 2017-10-17 DIAGNOSIS — E119 Type 2 diabetes mellitus without complications: Secondary | ICD-10-CM | POA: Diagnosis not present

## 2017-10-17 DIAGNOSIS — D631 Anemia in chronic kidney disease: Secondary | ICD-10-CM | POA: Diagnosis not present

## 2017-10-20 DIAGNOSIS — E119 Type 2 diabetes mellitus without complications: Secondary | ICD-10-CM | POA: Diagnosis not present

## 2017-10-20 DIAGNOSIS — N186 End stage renal disease: Secondary | ICD-10-CM | POA: Diagnosis not present

## 2017-10-20 DIAGNOSIS — D631 Anemia in chronic kidney disease: Secondary | ICD-10-CM | POA: Diagnosis not present

## 2017-10-20 DIAGNOSIS — N2581 Secondary hyperparathyroidism of renal origin: Secondary | ICD-10-CM | POA: Diagnosis not present

## 2017-10-20 DIAGNOSIS — D509 Iron deficiency anemia, unspecified: Secondary | ICD-10-CM | POA: Diagnosis not present

## 2017-10-22 DIAGNOSIS — D509 Iron deficiency anemia, unspecified: Secondary | ICD-10-CM | POA: Diagnosis not present

## 2017-10-22 DIAGNOSIS — N2581 Secondary hyperparathyroidism of renal origin: Secondary | ICD-10-CM | POA: Diagnosis not present

## 2017-10-22 DIAGNOSIS — E119 Type 2 diabetes mellitus without complications: Secondary | ICD-10-CM | POA: Diagnosis not present

## 2017-10-22 DIAGNOSIS — N186 End stage renal disease: Secondary | ICD-10-CM | POA: Diagnosis not present

## 2017-10-22 DIAGNOSIS — D631 Anemia in chronic kidney disease: Secondary | ICD-10-CM | POA: Diagnosis not present

## 2017-10-24 DIAGNOSIS — D509 Iron deficiency anemia, unspecified: Secondary | ICD-10-CM | POA: Diagnosis not present

## 2017-10-24 DIAGNOSIS — Z992 Dependence on renal dialysis: Secondary | ICD-10-CM | POA: Diagnosis not present

## 2017-10-24 DIAGNOSIS — E119 Type 2 diabetes mellitus without complications: Secondary | ICD-10-CM | POA: Diagnosis not present

## 2017-10-24 DIAGNOSIS — N2581 Secondary hyperparathyroidism of renal origin: Secondary | ICD-10-CM | POA: Diagnosis not present

## 2017-10-24 DIAGNOSIS — I120 Hypertensive chronic kidney disease with stage 5 chronic kidney disease or end stage renal disease: Secondary | ICD-10-CM | POA: Diagnosis not present

## 2017-10-24 DIAGNOSIS — N186 End stage renal disease: Secondary | ICD-10-CM | POA: Diagnosis not present

## 2017-10-27 DIAGNOSIS — N186 End stage renal disease: Secondary | ICD-10-CM | POA: Diagnosis not present

## 2017-10-27 DIAGNOSIS — N2581 Secondary hyperparathyroidism of renal origin: Secondary | ICD-10-CM | POA: Diagnosis not present

## 2017-10-27 DIAGNOSIS — E119 Type 2 diabetes mellitus without complications: Secondary | ICD-10-CM | POA: Diagnosis not present

## 2017-10-27 DIAGNOSIS — D509 Iron deficiency anemia, unspecified: Secondary | ICD-10-CM | POA: Diagnosis not present

## 2017-10-29 DIAGNOSIS — E119 Type 2 diabetes mellitus without complications: Secondary | ICD-10-CM | POA: Diagnosis not present

## 2017-10-29 DIAGNOSIS — N186 End stage renal disease: Secondary | ICD-10-CM | POA: Diagnosis not present

## 2017-10-29 DIAGNOSIS — D509 Iron deficiency anemia, unspecified: Secondary | ICD-10-CM | POA: Diagnosis not present

## 2017-10-29 DIAGNOSIS — N2581 Secondary hyperparathyroidism of renal origin: Secondary | ICD-10-CM | POA: Diagnosis not present

## 2017-10-31 DIAGNOSIS — N2581 Secondary hyperparathyroidism of renal origin: Secondary | ICD-10-CM | POA: Diagnosis not present

## 2017-10-31 DIAGNOSIS — E119 Type 2 diabetes mellitus without complications: Secondary | ICD-10-CM | POA: Diagnosis not present

## 2017-10-31 DIAGNOSIS — N186 End stage renal disease: Secondary | ICD-10-CM | POA: Diagnosis not present

## 2017-10-31 DIAGNOSIS — D509 Iron deficiency anemia, unspecified: Secondary | ICD-10-CM | POA: Diagnosis not present

## 2017-11-03 DIAGNOSIS — D509 Iron deficiency anemia, unspecified: Secondary | ICD-10-CM | POA: Diagnosis not present

## 2017-11-03 DIAGNOSIS — N2581 Secondary hyperparathyroidism of renal origin: Secondary | ICD-10-CM | POA: Diagnosis not present

## 2017-11-03 DIAGNOSIS — N186 End stage renal disease: Secondary | ICD-10-CM | POA: Diagnosis not present

## 2017-11-03 DIAGNOSIS — E119 Type 2 diabetes mellitus without complications: Secondary | ICD-10-CM | POA: Diagnosis not present

## 2017-11-05 DIAGNOSIS — E119 Type 2 diabetes mellitus without complications: Secondary | ICD-10-CM | POA: Diagnosis not present

## 2017-11-05 DIAGNOSIS — N186 End stage renal disease: Secondary | ICD-10-CM | POA: Diagnosis not present

## 2017-11-05 DIAGNOSIS — D509 Iron deficiency anemia, unspecified: Secondary | ICD-10-CM | POA: Diagnosis not present

## 2017-11-05 DIAGNOSIS — N2581 Secondary hyperparathyroidism of renal origin: Secondary | ICD-10-CM | POA: Diagnosis not present

## 2017-11-07 DIAGNOSIS — N186 End stage renal disease: Secondary | ICD-10-CM | POA: Diagnosis not present

## 2017-11-07 DIAGNOSIS — E119 Type 2 diabetes mellitus without complications: Secondary | ICD-10-CM | POA: Diagnosis not present

## 2017-11-07 DIAGNOSIS — N2581 Secondary hyperparathyroidism of renal origin: Secondary | ICD-10-CM | POA: Diagnosis not present

## 2017-11-07 DIAGNOSIS — D509 Iron deficiency anemia, unspecified: Secondary | ICD-10-CM | POA: Diagnosis not present

## 2017-11-10 DIAGNOSIS — N186 End stage renal disease: Secondary | ICD-10-CM | POA: Diagnosis not present

## 2017-11-10 DIAGNOSIS — E119 Type 2 diabetes mellitus without complications: Secondary | ICD-10-CM | POA: Diagnosis not present

## 2017-11-10 DIAGNOSIS — N2581 Secondary hyperparathyroidism of renal origin: Secondary | ICD-10-CM | POA: Diagnosis not present

## 2017-11-10 DIAGNOSIS — D509 Iron deficiency anemia, unspecified: Secondary | ICD-10-CM | POA: Diagnosis not present

## 2017-11-12 DIAGNOSIS — N2581 Secondary hyperparathyroidism of renal origin: Secondary | ICD-10-CM | POA: Diagnosis not present

## 2017-11-12 DIAGNOSIS — E119 Type 2 diabetes mellitus without complications: Secondary | ICD-10-CM | POA: Diagnosis not present

## 2017-11-12 DIAGNOSIS — N186 End stage renal disease: Secondary | ICD-10-CM | POA: Diagnosis not present

## 2017-11-12 DIAGNOSIS — D509 Iron deficiency anemia, unspecified: Secondary | ICD-10-CM | POA: Diagnosis not present

## 2017-11-17 DIAGNOSIS — E119 Type 2 diabetes mellitus without complications: Secondary | ICD-10-CM | POA: Diagnosis not present

## 2017-11-17 DIAGNOSIS — D509 Iron deficiency anemia, unspecified: Secondary | ICD-10-CM | POA: Diagnosis not present

## 2017-11-17 DIAGNOSIS — N2581 Secondary hyperparathyroidism of renal origin: Secondary | ICD-10-CM | POA: Diagnosis not present

## 2017-11-17 DIAGNOSIS — N186 End stage renal disease: Secondary | ICD-10-CM | POA: Diagnosis not present

## 2017-11-19 DIAGNOSIS — N2581 Secondary hyperparathyroidism of renal origin: Secondary | ICD-10-CM | POA: Diagnosis not present

## 2017-11-19 DIAGNOSIS — D509 Iron deficiency anemia, unspecified: Secondary | ICD-10-CM | POA: Diagnosis not present

## 2017-11-19 DIAGNOSIS — E119 Type 2 diabetes mellitus without complications: Secondary | ICD-10-CM | POA: Diagnosis not present

## 2017-11-19 DIAGNOSIS — N186 End stage renal disease: Secondary | ICD-10-CM | POA: Diagnosis not present

## 2017-11-21 DIAGNOSIS — E119 Type 2 diabetes mellitus without complications: Secondary | ICD-10-CM | POA: Diagnosis not present

## 2017-11-21 DIAGNOSIS — D509 Iron deficiency anemia, unspecified: Secondary | ICD-10-CM | POA: Diagnosis not present

## 2017-11-21 DIAGNOSIS — N186 End stage renal disease: Secondary | ICD-10-CM | POA: Diagnosis not present

## 2017-11-21 DIAGNOSIS — N2581 Secondary hyperparathyroidism of renal origin: Secondary | ICD-10-CM | POA: Diagnosis not present

## 2017-11-23 DIAGNOSIS — Z992 Dependence on renal dialysis: Secondary | ICD-10-CM | POA: Diagnosis not present

## 2017-11-23 DIAGNOSIS — N186 End stage renal disease: Secondary | ICD-10-CM | POA: Diagnosis not present

## 2017-11-23 DIAGNOSIS — I120 Hypertensive chronic kidney disease with stage 5 chronic kidney disease or end stage renal disease: Secondary | ICD-10-CM | POA: Diagnosis not present

## 2017-11-24 DIAGNOSIS — E119 Type 2 diabetes mellitus without complications: Secondary | ICD-10-CM | POA: Diagnosis not present

## 2017-11-24 DIAGNOSIS — N186 End stage renal disease: Secondary | ICD-10-CM | POA: Diagnosis not present

## 2017-11-24 DIAGNOSIS — N2581 Secondary hyperparathyroidism of renal origin: Secondary | ICD-10-CM | POA: Diagnosis not present

## 2017-11-24 DIAGNOSIS — D509 Iron deficiency anemia, unspecified: Secondary | ICD-10-CM | POA: Diagnosis not present

## 2017-11-24 DIAGNOSIS — D631 Anemia in chronic kidney disease: Secondary | ICD-10-CM | POA: Diagnosis not present

## 2017-11-26 DIAGNOSIS — D509 Iron deficiency anemia, unspecified: Secondary | ICD-10-CM | POA: Diagnosis not present

## 2017-11-26 DIAGNOSIS — E119 Type 2 diabetes mellitus without complications: Secondary | ICD-10-CM | POA: Diagnosis not present

## 2017-11-26 DIAGNOSIS — N2581 Secondary hyperparathyroidism of renal origin: Secondary | ICD-10-CM | POA: Diagnosis not present

## 2017-11-26 DIAGNOSIS — D631 Anemia in chronic kidney disease: Secondary | ICD-10-CM | POA: Diagnosis not present

## 2017-11-26 DIAGNOSIS — N186 End stage renal disease: Secondary | ICD-10-CM | POA: Diagnosis not present

## 2017-11-28 DIAGNOSIS — E119 Type 2 diabetes mellitus without complications: Secondary | ICD-10-CM | POA: Diagnosis not present

## 2017-11-28 DIAGNOSIS — D509 Iron deficiency anemia, unspecified: Secondary | ICD-10-CM | POA: Diagnosis not present

## 2017-11-28 DIAGNOSIS — N2581 Secondary hyperparathyroidism of renal origin: Secondary | ICD-10-CM | POA: Diagnosis not present

## 2017-11-28 DIAGNOSIS — D631 Anemia in chronic kidney disease: Secondary | ICD-10-CM | POA: Diagnosis not present

## 2017-11-28 DIAGNOSIS — N186 End stage renal disease: Secondary | ICD-10-CM | POA: Diagnosis not present

## 2017-12-01 DIAGNOSIS — D509 Iron deficiency anemia, unspecified: Secondary | ICD-10-CM | POA: Diagnosis not present

## 2017-12-01 DIAGNOSIS — E119 Type 2 diabetes mellitus without complications: Secondary | ICD-10-CM | POA: Diagnosis not present

## 2017-12-01 DIAGNOSIS — D631 Anemia in chronic kidney disease: Secondary | ICD-10-CM | POA: Diagnosis not present

## 2017-12-01 DIAGNOSIS — N2581 Secondary hyperparathyroidism of renal origin: Secondary | ICD-10-CM | POA: Diagnosis not present

## 2017-12-01 DIAGNOSIS — N186 End stage renal disease: Secondary | ICD-10-CM | POA: Diagnosis not present

## 2017-12-03 DIAGNOSIS — N186 End stage renal disease: Secondary | ICD-10-CM | POA: Diagnosis not present

## 2017-12-03 DIAGNOSIS — D509 Iron deficiency anemia, unspecified: Secondary | ICD-10-CM | POA: Diagnosis not present

## 2017-12-03 DIAGNOSIS — E119 Type 2 diabetes mellitus without complications: Secondary | ICD-10-CM | POA: Diagnosis not present

## 2017-12-03 DIAGNOSIS — N2581 Secondary hyperparathyroidism of renal origin: Secondary | ICD-10-CM | POA: Diagnosis not present

## 2017-12-03 DIAGNOSIS — D631 Anemia in chronic kidney disease: Secondary | ICD-10-CM | POA: Diagnosis not present

## 2017-12-03 DIAGNOSIS — E1129 Type 2 diabetes mellitus with other diabetic kidney complication: Secondary | ICD-10-CM | POA: Diagnosis not present

## 2017-12-05 DIAGNOSIS — D509 Iron deficiency anemia, unspecified: Secondary | ICD-10-CM | POA: Diagnosis not present

## 2017-12-05 DIAGNOSIS — D631 Anemia in chronic kidney disease: Secondary | ICD-10-CM | POA: Diagnosis not present

## 2017-12-05 DIAGNOSIS — E119 Type 2 diabetes mellitus without complications: Secondary | ICD-10-CM | POA: Diagnosis not present

## 2017-12-05 DIAGNOSIS — N2581 Secondary hyperparathyroidism of renal origin: Secondary | ICD-10-CM | POA: Diagnosis not present

## 2017-12-05 DIAGNOSIS — N186 End stage renal disease: Secondary | ICD-10-CM | POA: Diagnosis not present

## 2017-12-08 DIAGNOSIS — D631 Anemia in chronic kidney disease: Secondary | ICD-10-CM | POA: Diagnosis not present

## 2017-12-08 DIAGNOSIS — E119 Type 2 diabetes mellitus without complications: Secondary | ICD-10-CM | POA: Diagnosis not present

## 2017-12-08 DIAGNOSIS — D509 Iron deficiency anemia, unspecified: Secondary | ICD-10-CM | POA: Diagnosis not present

## 2017-12-08 DIAGNOSIS — N2581 Secondary hyperparathyroidism of renal origin: Secondary | ICD-10-CM | POA: Diagnosis not present

## 2017-12-08 DIAGNOSIS — N186 End stage renal disease: Secondary | ICD-10-CM | POA: Diagnosis not present

## 2017-12-10 DIAGNOSIS — E119 Type 2 diabetes mellitus without complications: Secondary | ICD-10-CM | POA: Diagnosis not present

## 2017-12-10 DIAGNOSIS — N2581 Secondary hyperparathyroidism of renal origin: Secondary | ICD-10-CM | POA: Diagnosis not present

## 2017-12-10 DIAGNOSIS — D631 Anemia in chronic kidney disease: Secondary | ICD-10-CM | POA: Diagnosis not present

## 2017-12-10 DIAGNOSIS — D509 Iron deficiency anemia, unspecified: Secondary | ICD-10-CM | POA: Diagnosis not present

## 2017-12-10 DIAGNOSIS — N186 End stage renal disease: Secondary | ICD-10-CM | POA: Diagnosis not present

## 2017-12-12 DIAGNOSIS — D509 Iron deficiency anemia, unspecified: Secondary | ICD-10-CM | POA: Diagnosis not present

## 2017-12-12 DIAGNOSIS — N186 End stage renal disease: Secondary | ICD-10-CM | POA: Diagnosis not present

## 2017-12-12 DIAGNOSIS — D631 Anemia in chronic kidney disease: Secondary | ICD-10-CM | POA: Diagnosis not present

## 2017-12-12 DIAGNOSIS — N2581 Secondary hyperparathyroidism of renal origin: Secondary | ICD-10-CM | POA: Diagnosis not present

## 2017-12-12 DIAGNOSIS — E119 Type 2 diabetes mellitus without complications: Secondary | ICD-10-CM | POA: Diagnosis not present

## 2017-12-15 DIAGNOSIS — N186 End stage renal disease: Secondary | ICD-10-CM | POA: Diagnosis not present

## 2017-12-15 DIAGNOSIS — N2581 Secondary hyperparathyroidism of renal origin: Secondary | ICD-10-CM | POA: Diagnosis not present

## 2017-12-15 DIAGNOSIS — D509 Iron deficiency anemia, unspecified: Secondary | ICD-10-CM | POA: Diagnosis not present

## 2017-12-15 DIAGNOSIS — D631 Anemia in chronic kidney disease: Secondary | ICD-10-CM | POA: Diagnosis not present

## 2017-12-15 DIAGNOSIS — E119 Type 2 diabetes mellitus without complications: Secondary | ICD-10-CM | POA: Diagnosis not present

## 2017-12-17 DIAGNOSIS — E119 Type 2 diabetes mellitus without complications: Secondary | ICD-10-CM | POA: Diagnosis not present

## 2017-12-17 DIAGNOSIS — N2581 Secondary hyperparathyroidism of renal origin: Secondary | ICD-10-CM | POA: Diagnosis not present

## 2017-12-17 DIAGNOSIS — N186 End stage renal disease: Secondary | ICD-10-CM | POA: Diagnosis not present

## 2017-12-17 DIAGNOSIS — D631 Anemia in chronic kidney disease: Secondary | ICD-10-CM | POA: Diagnosis not present

## 2017-12-17 DIAGNOSIS — D509 Iron deficiency anemia, unspecified: Secondary | ICD-10-CM | POA: Diagnosis not present

## 2017-12-19 DIAGNOSIS — E119 Type 2 diabetes mellitus without complications: Secondary | ICD-10-CM | POA: Diagnosis not present

## 2017-12-19 DIAGNOSIS — N2581 Secondary hyperparathyroidism of renal origin: Secondary | ICD-10-CM | POA: Diagnosis not present

## 2017-12-19 DIAGNOSIS — D631 Anemia in chronic kidney disease: Secondary | ICD-10-CM | POA: Diagnosis not present

## 2017-12-19 DIAGNOSIS — D509 Iron deficiency anemia, unspecified: Secondary | ICD-10-CM | POA: Diagnosis not present

## 2017-12-19 DIAGNOSIS — N186 End stage renal disease: Secondary | ICD-10-CM | POA: Diagnosis not present

## 2017-12-22 DIAGNOSIS — D509 Iron deficiency anemia, unspecified: Secondary | ICD-10-CM | POA: Diagnosis not present

## 2017-12-22 DIAGNOSIS — D631 Anemia in chronic kidney disease: Secondary | ICD-10-CM | POA: Diagnosis not present

## 2017-12-22 DIAGNOSIS — E119 Type 2 diabetes mellitus without complications: Secondary | ICD-10-CM | POA: Diagnosis not present

## 2017-12-22 DIAGNOSIS — N2581 Secondary hyperparathyroidism of renal origin: Secondary | ICD-10-CM | POA: Diagnosis not present

## 2017-12-22 DIAGNOSIS — N186 End stage renal disease: Secondary | ICD-10-CM | POA: Diagnosis not present

## 2017-12-24 DIAGNOSIS — I120 Hypertensive chronic kidney disease with stage 5 chronic kidney disease or end stage renal disease: Secondary | ICD-10-CM | POA: Diagnosis not present

## 2017-12-24 DIAGNOSIS — E119 Type 2 diabetes mellitus without complications: Secondary | ICD-10-CM | POA: Diagnosis not present

## 2017-12-24 DIAGNOSIS — N186 End stage renal disease: Secondary | ICD-10-CM | POA: Diagnosis not present

## 2017-12-24 DIAGNOSIS — Z992 Dependence on renal dialysis: Secondary | ICD-10-CM | POA: Diagnosis not present

## 2017-12-24 DIAGNOSIS — D631 Anemia in chronic kidney disease: Secondary | ICD-10-CM | POA: Diagnosis not present

## 2017-12-24 DIAGNOSIS — D509 Iron deficiency anemia, unspecified: Secondary | ICD-10-CM | POA: Diagnosis not present

## 2017-12-24 DIAGNOSIS — N2581 Secondary hyperparathyroidism of renal origin: Secondary | ICD-10-CM | POA: Diagnosis not present

## 2017-12-26 DIAGNOSIS — D509 Iron deficiency anemia, unspecified: Secondary | ICD-10-CM | POA: Diagnosis not present

## 2017-12-26 DIAGNOSIS — N186 End stage renal disease: Secondary | ICD-10-CM | POA: Diagnosis not present

## 2017-12-26 DIAGNOSIS — N2581 Secondary hyperparathyroidism of renal origin: Secondary | ICD-10-CM | POA: Diagnosis not present

## 2017-12-26 DIAGNOSIS — D631 Anemia in chronic kidney disease: Secondary | ICD-10-CM | POA: Diagnosis not present

## 2017-12-26 DIAGNOSIS — E119 Type 2 diabetes mellitus without complications: Secondary | ICD-10-CM | POA: Diagnosis not present

## 2017-12-29 DIAGNOSIS — D509 Iron deficiency anemia, unspecified: Secondary | ICD-10-CM | POA: Diagnosis not present

## 2017-12-29 DIAGNOSIS — N186 End stage renal disease: Secondary | ICD-10-CM | POA: Diagnosis not present

## 2017-12-29 DIAGNOSIS — E119 Type 2 diabetes mellitus without complications: Secondary | ICD-10-CM | POA: Diagnosis not present

## 2017-12-29 DIAGNOSIS — D631 Anemia in chronic kidney disease: Secondary | ICD-10-CM | POA: Diagnosis not present

## 2017-12-29 DIAGNOSIS — N2581 Secondary hyperparathyroidism of renal origin: Secondary | ICD-10-CM | POA: Diagnosis not present

## 2017-12-31 DIAGNOSIS — N2581 Secondary hyperparathyroidism of renal origin: Secondary | ICD-10-CM | POA: Diagnosis not present

## 2017-12-31 DIAGNOSIS — E119 Type 2 diabetes mellitus without complications: Secondary | ICD-10-CM | POA: Diagnosis not present

## 2017-12-31 DIAGNOSIS — D631 Anemia in chronic kidney disease: Secondary | ICD-10-CM | POA: Diagnosis not present

## 2017-12-31 DIAGNOSIS — N186 End stage renal disease: Secondary | ICD-10-CM | POA: Diagnosis not present

## 2017-12-31 DIAGNOSIS — D509 Iron deficiency anemia, unspecified: Secondary | ICD-10-CM | POA: Diagnosis not present

## 2018-01-02 DIAGNOSIS — N186 End stage renal disease: Secondary | ICD-10-CM | POA: Diagnosis not present

## 2018-01-02 DIAGNOSIS — E119 Type 2 diabetes mellitus without complications: Secondary | ICD-10-CM | POA: Diagnosis not present

## 2018-01-02 DIAGNOSIS — D509 Iron deficiency anemia, unspecified: Secondary | ICD-10-CM | POA: Diagnosis not present

## 2018-01-02 DIAGNOSIS — N2581 Secondary hyperparathyroidism of renal origin: Secondary | ICD-10-CM | POA: Diagnosis not present

## 2018-01-02 DIAGNOSIS — D631 Anemia in chronic kidney disease: Secondary | ICD-10-CM | POA: Diagnosis not present

## 2018-01-05 DIAGNOSIS — E119 Type 2 diabetes mellitus without complications: Secondary | ICD-10-CM | POA: Diagnosis not present

## 2018-01-05 DIAGNOSIS — D509 Iron deficiency anemia, unspecified: Secondary | ICD-10-CM | POA: Diagnosis not present

## 2018-01-05 DIAGNOSIS — N186 End stage renal disease: Secondary | ICD-10-CM | POA: Diagnosis not present

## 2018-01-05 DIAGNOSIS — D631 Anemia in chronic kidney disease: Secondary | ICD-10-CM | POA: Diagnosis not present

## 2018-01-05 DIAGNOSIS — N2581 Secondary hyperparathyroidism of renal origin: Secondary | ICD-10-CM | POA: Diagnosis not present

## 2018-01-07 DIAGNOSIS — D509 Iron deficiency anemia, unspecified: Secondary | ICD-10-CM | POA: Diagnosis not present

## 2018-01-07 DIAGNOSIS — N2581 Secondary hyperparathyroidism of renal origin: Secondary | ICD-10-CM | POA: Diagnosis not present

## 2018-01-07 DIAGNOSIS — E119 Type 2 diabetes mellitus without complications: Secondary | ICD-10-CM | POA: Diagnosis not present

## 2018-01-07 DIAGNOSIS — D631 Anemia in chronic kidney disease: Secondary | ICD-10-CM | POA: Diagnosis not present

## 2018-01-07 DIAGNOSIS — N186 End stage renal disease: Secondary | ICD-10-CM | POA: Diagnosis not present

## 2018-01-09 DIAGNOSIS — N2581 Secondary hyperparathyroidism of renal origin: Secondary | ICD-10-CM | POA: Diagnosis not present

## 2018-01-09 DIAGNOSIS — D509 Iron deficiency anemia, unspecified: Secondary | ICD-10-CM | POA: Diagnosis not present

## 2018-01-09 DIAGNOSIS — N186 End stage renal disease: Secondary | ICD-10-CM | POA: Diagnosis not present

## 2018-01-09 DIAGNOSIS — E119 Type 2 diabetes mellitus without complications: Secondary | ICD-10-CM | POA: Diagnosis not present

## 2018-01-09 DIAGNOSIS — D631 Anemia in chronic kidney disease: Secondary | ICD-10-CM | POA: Diagnosis not present

## 2018-01-12 DIAGNOSIS — D509 Iron deficiency anemia, unspecified: Secondary | ICD-10-CM | POA: Diagnosis not present

## 2018-01-12 DIAGNOSIS — E119 Type 2 diabetes mellitus without complications: Secondary | ICD-10-CM | POA: Diagnosis not present

## 2018-01-12 DIAGNOSIS — N2581 Secondary hyperparathyroidism of renal origin: Secondary | ICD-10-CM | POA: Diagnosis not present

## 2018-01-12 DIAGNOSIS — D631 Anemia in chronic kidney disease: Secondary | ICD-10-CM | POA: Diagnosis not present

## 2018-01-12 DIAGNOSIS — N186 End stage renal disease: Secondary | ICD-10-CM | POA: Diagnosis not present

## 2018-01-14 DIAGNOSIS — N186 End stage renal disease: Secondary | ICD-10-CM | POA: Diagnosis not present

## 2018-01-14 DIAGNOSIS — E119 Type 2 diabetes mellitus without complications: Secondary | ICD-10-CM | POA: Diagnosis not present

## 2018-01-14 DIAGNOSIS — D631 Anemia in chronic kidney disease: Secondary | ICD-10-CM | POA: Diagnosis not present

## 2018-01-14 DIAGNOSIS — N2581 Secondary hyperparathyroidism of renal origin: Secondary | ICD-10-CM | POA: Diagnosis not present

## 2018-01-14 DIAGNOSIS — D509 Iron deficiency anemia, unspecified: Secondary | ICD-10-CM | POA: Diagnosis not present

## 2018-01-19 DIAGNOSIS — D631 Anemia in chronic kidney disease: Secondary | ICD-10-CM | POA: Diagnosis not present

## 2018-01-19 DIAGNOSIS — D509 Iron deficiency anemia, unspecified: Secondary | ICD-10-CM | POA: Diagnosis not present

## 2018-01-19 DIAGNOSIS — N186 End stage renal disease: Secondary | ICD-10-CM | POA: Diagnosis not present

## 2018-01-19 DIAGNOSIS — E119 Type 2 diabetes mellitus without complications: Secondary | ICD-10-CM | POA: Diagnosis not present

## 2018-01-19 DIAGNOSIS — N2581 Secondary hyperparathyroidism of renal origin: Secondary | ICD-10-CM | POA: Diagnosis not present

## 2018-01-21 DIAGNOSIS — N2581 Secondary hyperparathyroidism of renal origin: Secondary | ICD-10-CM | POA: Diagnosis not present

## 2018-01-21 DIAGNOSIS — D509 Iron deficiency anemia, unspecified: Secondary | ICD-10-CM | POA: Diagnosis not present

## 2018-01-21 DIAGNOSIS — D631 Anemia in chronic kidney disease: Secondary | ICD-10-CM | POA: Diagnosis not present

## 2018-01-21 DIAGNOSIS — N186 End stage renal disease: Secondary | ICD-10-CM | POA: Diagnosis not present

## 2018-01-21 DIAGNOSIS — E119 Type 2 diabetes mellitus without complications: Secondary | ICD-10-CM | POA: Diagnosis not present

## 2018-01-23 DIAGNOSIS — N2581 Secondary hyperparathyroidism of renal origin: Secondary | ICD-10-CM | POA: Diagnosis not present

## 2018-01-23 DIAGNOSIS — D631 Anemia in chronic kidney disease: Secondary | ICD-10-CM | POA: Diagnosis not present

## 2018-01-23 DIAGNOSIS — E119 Type 2 diabetes mellitus without complications: Secondary | ICD-10-CM | POA: Diagnosis not present

## 2018-01-23 DIAGNOSIS — D509 Iron deficiency anemia, unspecified: Secondary | ICD-10-CM | POA: Diagnosis not present

## 2018-01-23 DIAGNOSIS — N186 End stage renal disease: Secondary | ICD-10-CM | POA: Diagnosis not present

## 2018-01-24 DIAGNOSIS — I120 Hypertensive chronic kidney disease with stage 5 chronic kidney disease or end stage renal disease: Secondary | ICD-10-CM | POA: Diagnosis not present

## 2018-01-24 DIAGNOSIS — Z992 Dependence on renal dialysis: Secondary | ICD-10-CM | POA: Diagnosis not present

## 2018-01-24 DIAGNOSIS — N186 End stage renal disease: Secondary | ICD-10-CM | POA: Diagnosis not present

## 2018-01-26 DIAGNOSIS — D509 Iron deficiency anemia, unspecified: Secondary | ICD-10-CM | POA: Diagnosis not present

## 2018-01-26 DIAGNOSIS — N2581 Secondary hyperparathyroidism of renal origin: Secondary | ICD-10-CM | POA: Diagnosis not present

## 2018-01-26 DIAGNOSIS — D631 Anemia in chronic kidney disease: Secondary | ICD-10-CM | POA: Diagnosis not present

## 2018-01-26 DIAGNOSIS — E119 Type 2 diabetes mellitus without complications: Secondary | ICD-10-CM | POA: Diagnosis not present

## 2018-01-26 DIAGNOSIS — N186 End stage renal disease: Secondary | ICD-10-CM | POA: Diagnosis not present

## 2018-01-28 DIAGNOSIS — D509 Iron deficiency anemia, unspecified: Secondary | ICD-10-CM | POA: Diagnosis not present

## 2018-01-28 DIAGNOSIS — D631 Anemia in chronic kidney disease: Secondary | ICD-10-CM | POA: Diagnosis not present

## 2018-01-28 DIAGNOSIS — N2581 Secondary hyperparathyroidism of renal origin: Secondary | ICD-10-CM | POA: Diagnosis not present

## 2018-01-28 DIAGNOSIS — E119 Type 2 diabetes mellitus without complications: Secondary | ICD-10-CM | POA: Diagnosis not present

## 2018-01-28 DIAGNOSIS — N186 End stage renal disease: Secondary | ICD-10-CM | POA: Diagnosis not present

## 2018-01-30 DIAGNOSIS — D509 Iron deficiency anemia, unspecified: Secondary | ICD-10-CM | POA: Diagnosis not present

## 2018-01-30 DIAGNOSIS — D631 Anemia in chronic kidney disease: Secondary | ICD-10-CM | POA: Diagnosis not present

## 2018-01-30 DIAGNOSIS — N2581 Secondary hyperparathyroidism of renal origin: Secondary | ICD-10-CM | POA: Diagnosis not present

## 2018-01-30 DIAGNOSIS — E119 Type 2 diabetes mellitus without complications: Secondary | ICD-10-CM | POA: Diagnosis not present

## 2018-01-30 DIAGNOSIS — N186 End stage renal disease: Secondary | ICD-10-CM | POA: Diagnosis not present

## 2018-02-02 DIAGNOSIS — D509 Iron deficiency anemia, unspecified: Secondary | ICD-10-CM | POA: Diagnosis not present

## 2018-02-02 DIAGNOSIS — D631 Anemia in chronic kidney disease: Secondary | ICD-10-CM | POA: Diagnosis not present

## 2018-02-02 DIAGNOSIS — N186 End stage renal disease: Secondary | ICD-10-CM | POA: Diagnosis not present

## 2018-02-02 DIAGNOSIS — N2581 Secondary hyperparathyroidism of renal origin: Secondary | ICD-10-CM | POA: Diagnosis not present

## 2018-02-02 DIAGNOSIS — E119 Type 2 diabetes mellitus without complications: Secondary | ICD-10-CM | POA: Diagnosis not present

## 2018-02-04 DIAGNOSIS — D631 Anemia in chronic kidney disease: Secondary | ICD-10-CM | POA: Diagnosis not present

## 2018-02-04 DIAGNOSIS — E119 Type 2 diabetes mellitus without complications: Secondary | ICD-10-CM | POA: Diagnosis not present

## 2018-02-04 DIAGNOSIS — N186 End stage renal disease: Secondary | ICD-10-CM | POA: Diagnosis not present

## 2018-02-04 DIAGNOSIS — N2581 Secondary hyperparathyroidism of renal origin: Secondary | ICD-10-CM | POA: Diagnosis not present

## 2018-02-04 DIAGNOSIS — D509 Iron deficiency anemia, unspecified: Secondary | ICD-10-CM | POA: Diagnosis not present

## 2018-02-06 DIAGNOSIS — D631 Anemia in chronic kidney disease: Secondary | ICD-10-CM | POA: Diagnosis not present

## 2018-02-06 DIAGNOSIS — E119 Type 2 diabetes mellitus without complications: Secondary | ICD-10-CM | POA: Diagnosis not present

## 2018-02-06 DIAGNOSIS — N2581 Secondary hyperparathyroidism of renal origin: Secondary | ICD-10-CM | POA: Diagnosis not present

## 2018-02-06 DIAGNOSIS — N186 End stage renal disease: Secondary | ICD-10-CM | POA: Diagnosis not present

## 2018-02-06 DIAGNOSIS — D509 Iron deficiency anemia, unspecified: Secondary | ICD-10-CM | POA: Diagnosis not present

## 2018-02-09 DIAGNOSIS — D631 Anemia in chronic kidney disease: Secondary | ICD-10-CM | POA: Diagnosis not present

## 2018-02-09 DIAGNOSIS — N2581 Secondary hyperparathyroidism of renal origin: Secondary | ICD-10-CM | POA: Diagnosis not present

## 2018-02-09 DIAGNOSIS — E119 Type 2 diabetes mellitus without complications: Secondary | ICD-10-CM | POA: Diagnosis not present

## 2018-02-09 DIAGNOSIS — N186 End stage renal disease: Secondary | ICD-10-CM | POA: Diagnosis not present

## 2018-02-09 DIAGNOSIS — D509 Iron deficiency anemia, unspecified: Secondary | ICD-10-CM | POA: Diagnosis not present

## 2018-02-11 DIAGNOSIS — E119 Type 2 diabetes mellitus without complications: Secondary | ICD-10-CM | POA: Diagnosis not present

## 2018-02-11 DIAGNOSIS — D509 Iron deficiency anemia, unspecified: Secondary | ICD-10-CM | POA: Diagnosis not present

## 2018-02-11 DIAGNOSIS — N2581 Secondary hyperparathyroidism of renal origin: Secondary | ICD-10-CM | POA: Diagnosis not present

## 2018-02-11 DIAGNOSIS — D631 Anemia in chronic kidney disease: Secondary | ICD-10-CM | POA: Diagnosis not present

## 2018-02-11 DIAGNOSIS — N186 End stage renal disease: Secondary | ICD-10-CM | POA: Diagnosis not present

## 2018-02-13 DIAGNOSIS — D631 Anemia in chronic kidney disease: Secondary | ICD-10-CM | POA: Diagnosis not present

## 2018-02-13 DIAGNOSIS — N2581 Secondary hyperparathyroidism of renal origin: Secondary | ICD-10-CM | POA: Diagnosis not present

## 2018-02-13 DIAGNOSIS — D509 Iron deficiency anemia, unspecified: Secondary | ICD-10-CM | POA: Diagnosis not present

## 2018-02-13 DIAGNOSIS — E119 Type 2 diabetes mellitus without complications: Secondary | ICD-10-CM | POA: Diagnosis not present

## 2018-02-13 DIAGNOSIS — N186 End stage renal disease: Secondary | ICD-10-CM | POA: Diagnosis not present

## 2018-02-16 DIAGNOSIS — D631 Anemia in chronic kidney disease: Secondary | ICD-10-CM | POA: Diagnosis not present

## 2018-02-16 DIAGNOSIS — D509 Iron deficiency anemia, unspecified: Secondary | ICD-10-CM | POA: Diagnosis not present

## 2018-02-16 DIAGNOSIS — N186 End stage renal disease: Secondary | ICD-10-CM | POA: Diagnosis not present

## 2018-02-16 DIAGNOSIS — E119 Type 2 diabetes mellitus without complications: Secondary | ICD-10-CM | POA: Diagnosis not present

## 2018-02-16 DIAGNOSIS — N2581 Secondary hyperparathyroidism of renal origin: Secondary | ICD-10-CM | POA: Diagnosis not present

## 2018-02-18 DIAGNOSIS — E119 Type 2 diabetes mellitus without complications: Secondary | ICD-10-CM | POA: Diagnosis not present

## 2018-02-18 DIAGNOSIS — D509 Iron deficiency anemia, unspecified: Secondary | ICD-10-CM | POA: Diagnosis not present

## 2018-02-18 DIAGNOSIS — N2581 Secondary hyperparathyroidism of renal origin: Secondary | ICD-10-CM | POA: Diagnosis not present

## 2018-02-18 DIAGNOSIS — D631 Anemia in chronic kidney disease: Secondary | ICD-10-CM | POA: Diagnosis not present

## 2018-02-18 DIAGNOSIS — N186 End stage renal disease: Secondary | ICD-10-CM | POA: Diagnosis not present

## 2018-02-20 DIAGNOSIS — E119 Type 2 diabetes mellitus without complications: Secondary | ICD-10-CM | POA: Diagnosis not present

## 2018-02-20 DIAGNOSIS — N2581 Secondary hyperparathyroidism of renal origin: Secondary | ICD-10-CM | POA: Diagnosis not present

## 2018-02-20 DIAGNOSIS — N186 End stage renal disease: Secondary | ICD-10-CM | POA: Diagnosis not present

## 2018-02-20 DIAGNOSIS — D509 Iron deficiency anemia, unspecified: Secondary | ICD-10-CM | POA: Diagnosis not present

## 2018-02-20 DIAGNOSIS — D631 Anemia in chronic kidney disease: Secondary | ICD-10-CM | POA: Diagnosis not present

## 2018-02-23 DIAGNOSIS — N2581 Secondary hyperparathyroidism of renal origin: Secondary | ICD-10-CM | POA: Diagnosis not present

## 2018-02-23 DIAGNOSIS — I120 Hypertensive chronic kidney disease with stage 5 chronic kidney disease or end stage renal disease: Secondary | ICD-10-CM | POA: Diagnosis not present

## 2018-02-23 DIAGNOSIS — N186 End stage renal disease: Secondary | ICD-10-CM | POA: Diagnosis not present

## 2018-02-23 DIAGNOSIS — Z992 Dependence on renal dialysis: Secondary | ICD-10-CM | POA: Diagnosis not present

## 2018-02-23 DIAGNOSIS — Z23 Encounter for immunization: Secondary | ICD-10-CM | POA: Diagnosis not present

## 2018-02-23 DIAGNOSIS — E119 Type 2 diabetes mellitus without complications: Secondary | ICD-10-CM | POA: Diagnosis not present

## 2018-02-23 DIAGNOSIS — D509 Iron deficiency anemia, unspecified: Secondary | ICD-10-CM | POA: Diagnosis not present

## 2018-02-25 DIAGNOSIS — N2581 Secondary hyperparathyroidism of renal origin: Secondary | ICD-10-CM | POA: Diagnosis not present

## 2018-02-25 DIAGNOSIS — N186 End stage renal disease: Secondary | ICD-10-CM | POA: Diagnosis not present

## 2018-02-25 DIAGNOSIS — E119 Type 2 diabetes mellitus without complications: Secondary | ICD-10-CM | POA: Diagnosis not present

## 2018-02-25 DIAGNOSIS — Z23 Encounter for immunization: Secondary | ICD-10-CM | POA: Diagnosis not present

## 2018-02-25 DIAGNOSIS — D509 Iron deficiency anemia, unspecified: Secondary | ICD-10-CM | POA: Diagnosis not present

## 2018-02-27 DIAGNOSIS — E119 Type 2 diabetes mellitus without complications: Secondary | ICD-10-CM | POA: Diagnosis not present

## 2018-02-27 DIAGNOSIS — N2581 Secondary hyperparathyroidism of renal origin: Secondary | ICD-10-CM | POA: Diagnosis not present

## 2018-02-27 DIAGNOSIS — D509 Iron deficiency anemia, unspecified: Secondary | ICD-10-CM | POA: Diagnosis not present

## 2018-02-27 DIAGNOSIS — Z23 Encounter for immunization: Secondary | ICD-10-CM | POA: Diagnosis not present

## 2018-02-27 DIAGNOSIS — N186 End stage renal disease: Secondary | ICD-10-CM | POA: Diagnosis not present

## 2018-03-02 DIAGNOSIS — N2581 Secondary hyperparathyroidism of renal origin: Secondary | ICD-10-CM | POA: Diagnosis not present

## 2018-03-02 DIAGNOSIS — E119 Type 2 diabetes mellitus without complications: Secondary | ICD-10-CM | POA: Diagnosis not present

## 2018-03-02 DIAGNOSIS — D509 Iron deficiency anemia, unspecified: Secondary | ICD-10-CM | POA: Diagnosis not present

## 2018-03-02 DIAGNOSIS — Z23 Encounter for immunization: Secondary | ICD-10-CM | POA: Diagnosis not present

## 2018-03-02 DIAGNOSIS — N186 End stage renal disease: Secondary | ICD-10-CM | POA: Diagnosis not present

## 2018-03-04 DIAGNOSIS — N186 End stage renal disease: Secondary | ICD-10-CM | POA: Diagnosis not present

## 2018-03-04 DIAGNOSIS — E1129 Type 2 diabetes mellitus with other diabetic kidney complication: Secondary | ICD-10-CM | POA: Diagnosis not present

## 2018-03-04 DIAGNOSIS — Z23 Encounter for immunization: Secondary | ICD-10-CM | POA: Diagnosis not present

## 2018-03-04 DIAGNOSIS — E119 Type 2 diabetes mellitus without complications: Secondary | ICD-10-CM | POA: Diagnosis not present

## 2018-03-04 DIAGNOSIS — N2581 Secondary hyperparathyroidism of renal origin: Secondary | ICD-10-CM | POA: Diagnosis not present

## 2018-03-04 DIAGNOSIS — D509 Iron deficiency anemia, unspecified: Secondary | ICD-10-CM | POA: Diagnosis not present

## 2018-03-06 DIAGNOSIS — N186 End stage renal disease: Secondary | ICD-10-CM | POA: Diagnosis not present

## 2018-03-06 DIAGNOSIS — E119 Type 2 diabetes mellitus without complications: Secondary | ICD-10-CM | POA: Diagnosis not present

## 2018-03-06 DIAGNOSIS — N2581 Secondary hyperparathyroidism of renal origin: Secondary | ICD-10-CM | POA: Diagnosis not present

## 2018-03-06 DIAGNOSIS — Z23 Encounter for immunization: Secondary | ICD-10-CM | POA: Diagnosis not present

## 2018-03-06 DIAGNOSIS — D509 Iron deficiency anemia, unspecified: Secondary | ICD-10-CM | POA: Diagnosis not present

## 2018-03-09 DIAGNOSIS — Z23 Encounter for immunization: Secondary | ICD-10-CM | POA: Diagnosis not present

## 2018-03-09 DIAGNOSIS — E119 Type 2 diabetes mellitus without complications: Secondary | ICD-10-CM | POA: Diagnosis not present

## 2018-03-09 DIAGNOSIS — N2581 Secondary hyperparathyroidism of renal origin: Secondary | ICD-10-CM | POA: Diagnosis not present

## 2018-03-09 DIAGNOSIS — D509 Iron deficiency anemia, unspecified: Secondary | ICD-10-CM | POA: Diagnosis not present

## 2018-03-09 DIAGNOSIS — N186 End stage renal disease: Secondary | ICD-10-CM | POA: Diagnosis not present

## 2018-03-11 DIAGNOSIS — Z23 Encounter for immunization: Secondary | ICD-10-CM | POA: Diagnosis not present

## 2018-03-11 DIAGNOSIS — N2581 Secondary hyperparathyroidism of renal origin: Secondary | ICD-10-CM | POA: Diagnosis not present

## 2018-03-11 DIAGNOSIS — D509 Iron deficiency anemia, unspecified: Secondary | ICD-10-CM | POA: Diagnosis not present

## 2018-03-11 DIAGNOSIS — E119 Type 2 diabetes mellitus without complications: Secondary | ICD-10-CM | POA: Diagnosis not present

## 2018-03-11 DIAGNOSIS — N186 End stage renal disease: Secondary | ICD-10-CM | POA: Diagnosis not present

## 2018-03-13 DIAGNOSIS — D509 Iron deficiency anemia, unspecified: Secondary | ICD-10-CM | POA: Diagnosis not present

## 2018-03-13 DIAGNOSIS — E119 Type 2 diabetes mellitus without complications: Secondary | ICD-10-CM | POA: Diagnosis not present

## 2018-03-13 DIAGNOSIS — N186 End stage renal disease: Secondary | ICD-10-CM | POA: Diagnosis not present

## 2018-03-13 DIAGNOSIS — Z23 Encounter for immunization: Secondary | ICD-10-CM | POA: Diagnosis not present

## 2018-03-13 DIAGNOSIS — N2581 Secondary hyperparathyroidism of renal origin: Secondary | ICD-10-CM | POA: Diagnosis not present

## 2018-03-16 DIAGNOSIS — E119 Type 2 diabetes mellitus without complications: Secondary | ICD-10-CM | POA: Diagnosis not present

## 2018-03-16 DIAGNOSIS — Z23 Encounter for immunization: Secondary | ICD-10-CM | POA: Diagnosis not present

## 2018-03-16 DIAGNOSIS — N186 End stage renal disease: Secondary | ICD-10-CM | POA: Diagnosis not present

## 2018-03-16 DIAGNOSIS — D509 Iron deficiency anemia, unspecified: Secondary | ICD-10-CM | POA: Diagnosis not present

## 2018-03-16 DIAGNOSIS — N2581 Secondary hyperparathyroidism of renal origin: Secondary | ICD-10-CM | POA: Diagnosis not present

## 2018-03-18 DIAGNOSIS — N186 End stage renal disease: Secondary | ICD-10-CM | POA: Diagnosis not present

## 2018-03-18 DIAGNOSIS — N2581 Secondary hyperparathyroidism of renal origin: Secondary | ICD-10-CM | POA: Diagnosis not present

## 2018-03-18 DIAGNOSIS — Z23 Encounter for immunization: Secondary | ICD-10-CM | POA: Diagnosis not present

## 2018-03-18 DIAGNOSIS — D509 Iron deficiency anemia, unspecified: Secondary | ICD-10-CM | POA: Diagnosis not present

## 2018-03-18 DIAGNOSIS — E119 Type 2 diabetes mellitus without complications: Secondary | ICD-10-CM | POA: Diagnosis not present

## 2018-03-20 DIAGNOSIS — Z23 Encounter for immunization: Secondary | ICD-10-CM | POA: Diagnosis not present

## 2018-03-20 DIAGNOSIS — E119 Type 2 diabetes mellitus without complications: Secondary | ICD-10-CM | POA: Diagnosis not present

## 2018-03-20 DIAGNOSIS — D509 Iron deficiency anemia, unspecified: Secondary | ICD-10-CM | POA: Diagnosis not present

## 2018-03-20 DIAGNOSIS — N186 End stage renal disease: Secondary | ICD-10-CM | POA: Diagnosis not present

## 2018-03-20 DIAGNOSIS — N2581 Secondary hyperparathyroidism of renal origin: Secondary | ICD-10-CM | POA: Diagnosis not present

## 2018-03-25 DIAGNOSIS — D509 Iron deficiency anemia, unspecified: Secondary | ICD-10-CM | POA: Diagnosis not present

## 2018-03-25 DIAGNOSIS — N186 End stage renal disease: Secondary | ICD-10-CM | POA: Diagnosis not present

## 2018-03-25 DIAGNOSIS — N2581 Secondary hyperparathyroidism of renal origin: Secondary | ICD-10-CM | POA: Diagnosis not present

## 2018-03-25 DIAGNOSIS — Z23 Encounter for immunization: Secondary | ICD-10-CM | POA: Diagnosis not present

## 2018-03-25 DIAGNOSIS — E119 Type 2 diabetes mellitus without complications: Secondary | ICD-10-CM | POA: Diagnosis not present

## 2018-03-26 DIAGNOSIS — Z992 Dependence on renal dialysis: Secondary | ICD-10-CM | POA: Diagnosis not present

## 2018-03-26 DIAGNOSIS — I120 Hypertensive chronic kidney disease with stage 5 chronic kidney disease or end stage renal disease: Secondary | ICD-10-CM | POA: Diagnosis not present

## 2018-03-26 DIAGNOSIS — N186 End stage renal disease: Secondary | ICD-10-CM | POA: Diagnosis not present

## 2018-03-27 DIAGNOSIS — N2581 Secondary hyperparathyroidism of renal origin: Secondary | ICD-10-CM | POA: Diagnosis not present

## 2018-03-27 DIAGNOSIS — E119 Type 2 diabetes mellitus without complications: Secondary | ICD-10-CM | POA: Diagnosis not present

## 2018-03-27 DIAGNOSIS — D509 Iron deficiency anemia, unspecified: Secondary | ICD-10-CM | POA: Diagnosis not present

## 2018-03-27 DIAGNOSIS — N186 End stage renal disease: Secondary | ICD-10-CM | POA: Diagnosis not present

## 2018-03-30 DIAGNOSIS — E119 Type 2 diabetes mellitus without complications: Secondary | ICD-10-CM | POA: Diagnosis not present

## 2018-03-30 DIAGNOSIS — N2581 Secondary hyperparathyroidism of renal origin: Secondary | ICD-10-CM | POA: Diagnosis not present

## 2018-03-30 DIAGNOSIS — D509 Iron deficiency anemia, unspecified: Secondary | ICD-10-CM | POA: Diagnosis not present

## 2018-03-30 DIAGNOSIS — N186 End stage renal disease: Secondary | ICD-10-CM | POA: Diagnosis not present

## 2018-04-01 DIAGNOSIS — N186 End stage renal disease: Secondary | ICD-10-CM | POA: Diagnosis not present

## 2018-04-01 DIAGNOSIS — D509 Iron deficiency anemia, unspecified: Secondary | ICD-10-CM | POA: Diagnosis not present

## 2018-04-01 DIAGNOSIS — E119 Type 2 diabetes mellitus without complications: Secondary | ICD-10-CM | POA: Diagnosis not present

## 2018-04-01 DIAGNOSIS — N2581 Secondary hyperparathyroidism of renal origin: Secondary | ICD-10-CM | POA: Diagnosis not present

## 2018-04-03 DIAGNOSIS — N2581 Secondary hyperparathyroidism of renal origin: Secondary | ICD-10-CM | POA: Diagnosis not present

## 2018-04-03 DIAGNOSIS — E119 Type 2 diabetes mellitus without complications: Secondary | ICD-10-CM | POA: Diagnosis not present

## 2018-04-03 DIAGNOSIS — D509 Iron deficiency anemia, unspecified: Secondary | ICD-10-CM | POA: Diagnosis not present

## 2018-04-03 DIAGNOSIS — N186 End stage renal disease: Secondary | ICD-10-CM | POA: Diagnosis not present

## 2018-04-06 DIAGNOSIS — E119 Type 2 diabetes mellitus without complications: Secondary | ICD-10-CM | POA: Diagnosis not present

## 2018-04-06 DIAGNOSIS — D509 Iron deficiency anemia, unspecified: Secondary | ICD-10-CM | POA: Diagnosis not present

## 2018-04-06 DIAGNOSIS — N186 End stage renal disease: Secondary | ICD-10-CM | POA: Diagnosis not present

## 2018-04-06 DIAGNOSIS — N2581 Secondary hyperparathyroidism of renal origin: Secondary | ICD-10-CM | POA: Diagnosis not present

## 2018-04-08 DIAGNOSIS — N186 End stage renal disease: Secondary | ICD-10-CM | POA: Diagnosis not present

## 2018-04-08 DIAGNOSIS — N2581 Secondary hyperparathyroidism of renal origin: Secondary | ICD-10-CM | POA: Diagnosis not present

## 2018-04-08 DIAGNOSIS — E119 Type 2 diabetes mellitus without complications: Secondary | ICD-10-CM | POA: Diagnosis not present

## 2018-04-08 DIAGNOSIS — D509 Iron deficiency anemia, unspecified: Secondary | ICD-10-CM | POA: Diagnosis not present

## 2018-04-10 DIAGNOSIS — E119 Type 2 diabetes mellitus without complications: Secondary | ICD-10-CM | POA: Diagnosis not present

## 2018-04-10 DIAGNOSIS — N2581 Secondary hyperparathyroidism of renal origin: Secondary | ICD-10-CM | POA: Diagnosis not present

## 2018-04-10 DIAGNOSIS — D509 Iron deficiency anemia, unspecified: Secondary | ICD-10-CM | POA: Diagnosis not present

## 2018-04-10 DIAGNOSIS — N186 End stage renal disease: Secondary | ICD-10-CM | POA: Diagnosis not present

## 2018-04-13 DIAGNOSIS — E119 Type 2 diabetes mellitus without complications: Secondary | ICD-10-CM | POA: Diagnosis not present

## 2018-04-13 DIAGNOSIS — N2581 Secondary hyperparathyroidism of renal origin: Secondary | ICD-10-CM | POA: Diagnosis not present

## 2018-04-13 DIAGNOSIS — D509 Iron deficiency anemia, unspecified: Secondary | ICD-10-CM | POA: Diagnosis not present

## 2018-04-13 DIAGNOSIS — N186 End stage renal disease: Secondary | ICD-10-CM | POA: Diagnosis not present

## 2018-04-15 DIAGNOSIS — N2581 Secondary hyperparathyroidism of renal origin: Secondary | ICD-10-CM | POA: Diagnosis not present

## 2018-04-15 DIAGNOSIS — D509 Iron deficiency anemia, unspecified: Secondary | ICD-10-CM | POA: Diagnosis not present

## 2018-04-15 DIAGNOSIS — N186 End stage renal disease: Secondary | ICD-10-CM | POA: Diagnosis not present

## 2018-04-15 DIAGNOSIS — E119 Type 2 diabetes mellitus without complications: Secondary | ICD-10-CM | POA: Diagnosis not present

## 2018-04-17 DIAGNOSIS — N2581 Secondary hyperparathyroidism of renal origin: Secondary | ICD-10-CM | POA: Diagnosis not present

## 2018-04-17 DIAGNOSIS — N186 End stage renal disease: Secondary | ICD-10-CM | POA: Diagnosis not present

## 2018-04-17 DIAGNOSIS — D509 Iron deficiency anemia, unspecified: Secondary | ICD-10-CM | POA: Diagnosis not present

## 2018-04-17 DIAGNOSIS — E119 Type 2 diabetes mellitus without complications: Secondary | ICD-10-CM | POA: Diagnosis not present

## 2018-04-19 DIAGNOSIS — N186 End stage renal disease: Secondary | ICD-10-CM | POA: Diagnosis not present

## 2018-04-19 DIAGNOSIS — E119 Type 2 diabetes mellitus without complications: Secondary | ICD-10-CM | POA: Diagnosis not present

## 2018-04-19 DIAGNOSIS — D509 Iron deficiency anemia, unspecified: Secondary | ICD-10-CM | POA: Diagnosis not present

## 2018-04-19 DIAGNOSIS — N2581 Secondary hyperparathyroidism of renal origin: Secondary | ICD-10-CM | POA: Diagnosis not present

## 2018-04-21 DIAGNOSIS — E119 Type 2 diabetes mellitus without complications: Secondary | ICD-10-CM | POA: Diagnosis not present

## 2018-04-21 DIAGNOSIS — N2581 Secondary hyperparathyroidism of renal origin: Secondary | ICD-10-CM | POA: Diagnosis not present

## 2018-04-21 DIAGNOSIS — N186 End stage renal disease: Secondary | ICD-10-CM | POA: Diagnosis not present

## 2018-04-21 DIAGNOSIS — D509 Iron deficiency anemia, unspecified: Secondary | ICD-10-CM | POA: Diagnosis not present

## 2018-04-24 DIAGNOSIS — N186 End stage renal disease: Secondary | ICD-10-CM | POA: Diagnosis not present

## 2018-04-24 DIAGNOSIS — N2581 Secondary hyperparathyroidism of renal origin: Secondary | ICD-10-CM | POA: Diagnosis not present

## 2018-04-24 DIAGNOSIS — D509 Iron deficiency anemia, unspecified: Secondary | ICD-10-CM | POA: Diagnosis not present

## 2018-04-24 DIAGNOSIS — E119 Type 2 diabetes mellitus without complications: Secondary | ICD-10-CM | POA: Diagnosis not present

## 2018-04-25 DIAGNOSIS — N186 End stage renal disease: Secondary | ICD-10-CM | POA: Diagnosis not present

## 2018-04-25 DIAGNOSIS — Z992 Dependence on renal dialysis: Secondary | ICD-10-CM | POA: Diagnosis not present

## 2018-04-25 DIAGNOSIS — I120 Hypertensive chronic kidney disease with stage 5 chronic kidney disease or end stage renal disease: Secondary | ICD-10-CM | POA: Diagnosis not present

## 2018-04-27 DIAGNOSIS — N2581 Secondary hyperparathyroidism of renal origin: Secondary | ICD-10-CM | POA: Diagnosis not present

## 2018-04-27 DIAGNOSIS — D509 Iron deficiency anemia, unspecified: Secondary | ICD-10-CM | POA: Diagnosis not present

## 2018-04-27 DIAGNOSIS — E1129 Type 2 diabetes mellitus with other diabetic kidney complication: Secondary | ICD-10-CM | POA: Diagnosis not present

## 2018-04-27 DIAGNOSIS — N186 End stage renal disease: Secondary | ICD-10-CM | POA: Diagnosis not present

## 2018-04-27 DIAGNOSIS — D631 Anemia in chronic kidney disease: Secondary | ICD-10-CM | POA: Diagnosis not present

## 2018-04-27 DIAGNOSIS — E119 Type 2 diabetes mellitus without complications: Secondary | ICD-10-CM | POA: Diagnosis not present

## 2018-04-29 DIAGNOSIS — D509 Iron deficiency anemia, unspecified: Secondary | ICD-10-CM | POA: Diagnosis not present

## 2018-04-29 DIAGNOSIS — N186 End stage renal disease: Secondary | ICD-10-CM | POA: Diagnosis not present

## 2018-04-29 DIAGNOSIS — D631 Anemia in chronic kidney disease: Secondary | ICD-10-CM | POA: Diagnosis not present

## 2018-04-29 DIAGNOSIS — E1129 Type 2 diabetes mellitus with other diabetic kidney complication: Secondary | ICD-10-CM | POA: Diagnosis not present

## 2018-04-29 DIAGNOSIS — N2581 Secondary hyperparathyroidism of renal origin: Secondary | ICD-10-CM | POA: Diagnosis not present

## 2018-04-29 DIAGNOSIS — E119 Type 2 diabetes mellitus without complications: Secondary | ICD-10-CM | POA: Diagnosis not present

## 2018-05-01 DIAGNOSIS — E1129 Type 2 diabetes mellitus with other diabetic kidney complication: Secondary | ICD-10-CM | POA: Diagnosis not present

## 2018-05-01 DIAGNOSIS — E119 Type 2 diabetes mellitus without complications: Secondary | ICD-10-CM | POA: Diagnosis not present

## 2018-05-01 DIAGNOSIS — N2581 Secondary hyperparathyroidism of renal origin: Secondary | ICD-10-CM | POA: Diagnosis not present

## 2018-05-01 DIAGNOSIS — D631 Anemia in chronic kidney disease: Secondary | ICD-10-CM | POA: Diagnosis not present

## 2018-05-01 DIAGNOSIS — N186 End stage renal disease: Secondary | ICD-10-CM | POA: Diagnosis not present

## 2018-05-01 DIAGNOSIS — D509 Iron deficiency anemia, unspecified: Secondary | ICD-10-CM | POA: Diagnosis not present

## 2018-05-04 DIAGNOSIS — N2581 Secondary hyperparathyroidism of renal origin: Secondary | ICD-10-CM | POA: Diagnosis not present

## 2018-05-04 DIAGNOSIS — N186 End stage renal disease: Secondary | ICD-10-CM | POA: Diagnosis not present

## 2018-05-04 DIAGNOSIS — D631 Anemia in chronic kidney disease: Secondary | ICD-10-CM | POA: Diagnosis not present

## 2018-05-04 DIAGNOSIS — E119 Type 2 diabetes mellitus without complications: Secondary | ICD-10-CM | POA: Diagnosis not present

## 2018-05-04 DIAGNOSIS — D509 Iron deficiency anemia, unspecified: Secondary | ICD-10-CM | POA: Diagnosis not present

## 2018-05-04 DIAGNOSIS — E1129 Type 2 diabetes mellitus with other diabetic kidney complication: Secondary | ICD-10-CM | POA: Diagnosis not present

## 2018-05-05 ENCOUNTER — Encounter: Payer: Self-pay | Admitting: Vascular Surgery

## 2018-05-05 ENCOUNTER — Other Ambulatory Visit: Payer: Self-pay | Admitting: *Deleted

## 2018-05-05 ENCOUNTER — Encounter: Payer: Self-pay | Admitting: *Deleted

## 2018-05-05 ENCOUNTER — Other Ambulatory Visit: Payer: Self-pay

## 2018-05-05 ENCOUNTER — Ambulatory Visit (INDEPENDENT_AMBULATORY_CARE_PROVIDER_SITE_OTHER): Payer: Medicare Other | Admitting: Vascular Surgery

## 2018-05-05 VITALS — BP 158/97 | HR 93 | Temp 98.5°F | Resp 16 | Ht 71.0 in | Wt 265.0 lb

## 2018-05-05 DIAGNOSIS — Z992 Dependence on renal dialysis: Secondary | ICD-10-CM

## 2018-05-05 DIAGNOSIS — N186 End stage renal disease: Secondary | ICD-10-CM

## 2018-05-05 NOTE — H&P (View-Only) (Signed)
Patient name: Joshua [name]. MRN: [id number] DOB: [birth year] Sex: male  REASON FOR VISIT:   Large aneurysmal left upper arm fistula  HPI:   Joshua [name]. is a pleasant 37 y.o. male who was referred for evaluation of a large aneurysmal left upper arm fistula.  He has had this for many years.  Has had 1 previous revision of the proximal fistula.  He was referred because of increased swelling of the aneurysm which keeps getting larger.  He denies pain or paresthesias in the left arm.  He states that the dialysis center has not had any problems with his fistula.  He denies any recent uremic symptoms.  Current Outpatient Medications  Medication Sig Dispense Refill  . aspirin 81 MG tablet Take 81 mg by mouth daily.     . calcium acetate (PHOSLO) 667 MG capsule Take 1,334 mg by mouth 3 (three) times daily with meals.     . carvedilol (COREG) 12.5 MG tablet Take 25 mg by mouth 2 (two) times daily with a meal.     . carvedilol (COREG) 12.5 MG tablet Take by mouth.    . cinacalcet (SENSIPAR) 30 MG tablet Take 60 mg by mouth daily.    Marland Kitchen glimepiride (AMARYL) 4 MG tablet Take 8 mg by mouth 2 (two) times daily.     . hydrALAZINE (APRESOLINE) 25 MG tablet Take 1 tablet (25 mg total) by mouth 3 (three) times daily. 90 tablet 11  . JANUVIA 100 MG tablet Take 100 mg by mouth daily.     . minoxidil (LONITEN) 2.5 MG tablet Take 2 tablets (5 mg total) by mouth 2 (two) times daily. 120 tablet 0  . multivitamin (RENA-VIT) TABS tablet Take 1 tablet by mouth daily.    . promethazine (PHENERGAN) 12.5 MG tablet Take 12.5 mg by mouth every 6 (six) hours as needed for nausea or vomiting.    Marland Kitchen oxyCODONE-acetaminophen (PERCOCET) 5-325 MG tablet Take 1 tablet by mouth every 6 (six) hours as needed for severe pain. (Patient not taking: Reported on 05/05/2018) 12 tablet 0   No current facility-administered medications for this visit.     REVIEW OF SYSTEMS:  [X]  denotes positive finding, [ ]  denotes negative  finding Vascular    Leg swelling    Cardiac    Chest pain or chest pressure:    Shortness of breath upon exertion:    Short of breath when lying flat:    Irregular heart rhythm:    Constitutional    Fever or chills:     PHYSICAL EXAM:   Vitals:   05/05/18 0822  BP: (!) 158/97  Pulse: 93  Resp: 16  Temp: 98.5 F (36.9 C)  TempSrc: Oral  SpO2: 98%  Weight: 265 lb (120.2 kg)  Height: 5\' 11"  (1.803 m)    GENERAL: The patient is a well-nourished male, in no acute distress. The vital signs are documented above. CARDIOVASCULAR: There is a regular rate and rhythm. PULMONARY: There is good air exchange bilaterally without wheezing or rales. This fistula has an excellent thrill.  It is very aneurysmal with 3 large aneurysms. There is 1 small superficial wound on the central aneurysm.  DATA:   No new data  MEDICAL ISSUES:   ANEURYSMAL LEFT UPPER ARM FISTULA: I have explained that given how enlarged this is I think really the best option would be to place new access in the right arm and ultimately excised this large aneurysmal fistula.  However, he feels  quite strongly about continuing to use this for as long as possible.  This reason I think it would be reasonable to excise the central aneurysm which would still allow him to be accessed above and below this.  Ultimately I think he will need new access however.  He dialyzes on Tuesdays Thursdays and Saturdays and we will schedule this on a Monday.  The first available Monday is December 30.  I have discussed the procedure and potential complications with him and he is agreeable to proceed.  [name] [facility] [phone]

## 2018-05-05 NOTE — Progress Notes (Signed)
Patient name: Joshua [name]. MRN: [id number] DOB: 1980-12-20 Sex: male  REASON FOR VISIT:   Large aneurysmal left upper arm fistula  HPI:   Joshua [name]. is a pleasant 37 y.o. male who was referred for evaluation of a large aneurysmal left upper arm fistula.  He has had this for many years.  Has had 1 previous revision of the proximal fistula.  He was referred because of increased swelling of the aneurysm which keeps getting larger.  He denies pain or paresthesias in the left arm.  He states that the dialysis center has not had any problems with his fistula.  He denies any recent uremic symptoms.  Current Outpatient Medications  Medication Sig Dispense Refill  . aspirin 81 MG tablet Take 81 mg by mouth daily.     . calcium acetate (PHOSLO) 667 MG capsule Take 1,334 mg by mouth 3 (three) times daily with meals.     . carvedilol (COREG) 12.5 MG tablet Take 25 mg by mouth 2 (two) times daily with a meal.     . carvedilol (COREG) 12.5 MG tablet Take by mouth.    . cinacalcet (SENSIPAR) 30 MG tablet Take 60 mg by mouth daily.    Marland Kitchen glimepiride (AMARYL) 4 MG tablet Take 8 mg by mouth 2 (two) times daily.     . hydrALAZINE (APRESOLINE) 25 MG tablet Take 1 tablet (25 mg total) by mouth 3 (three) times daily. 90 tablet 11  . JANUVIA 100 MG tablet Take 100 mg by mouth daily.     . minoxidil (LONITEN) 2.5 MG tablet Take 2 tablets (5 mg total) by mouth 2 (two) times daily. 120 tablet 0  . multivitamin (RENA-VIT) TABS tablet Take 1 tablet by mouth daily.    . promethazine (PHENERGAN) 12.5 MG tablet Take 12.5 mg by mouth every 6 (six) hours as needed for nausea or vomiting.    Marland Kitchen oxyCODONE-acetaminophen (PERCOCET) 5-325 MG tablet Take 1 tablet by mouth every 6 (six) hours as needed for severe pain. (Patient not taking: Reported on 05/05/2018) 12 tablet 0   No current facility-administered medications for this visit.     REVIEW OF SYSTEMS:  [X]  denotes positive finding, [ ]  denotes negative  finding Vascular    Leg swelling    Cardiac    Chest pain or chest pressure:    Shortness of breath upon exertion:    Short of breath when lying flat:    Irregular heart rhythm:    Constitutional    Fever or chills:     PHYSICAL EXAM:   Vitals:   05/05/18 0822  BP: (!) 158/97  Pulse: 93  Resp: 16  Temp: 98.5 F (36.9 C)  TempSrc: Oral  SpO2: 98%  Weight: 265 lb (120.2 kg)  Height: 5\' 11"  (1.803 m)    GENERAL: The patient is a well-nourished male, in no acute distress. The vital signs are documented above. CARDIOVASCULAR: There is a regular rate and rhythm. PULMONARY: There is good air exchange bilaterally without wheezing or rales. This fistula has an excellent thrill.  It is very aneurysmal with 3 large aneurysms. There is 1 small superficial wound on the central aneurysm.  DATA:   No new data  MEDICAL ISSUES:   ANEURYSMAL LEFT UPPER ARM FISTULA: I have explained that given how enlarged this is I think really the best option would be to place new access in the right arm and ultimately excised this large aneurysmal fistula.  However, he feels  quite strongly about continuing to use this for as long as possible.  This reason I think it would be reasonable to excise the central aneurysm which would still allow him to be accessed above and below this.  Ultimately I think he will need new access however.  He dialyzes on Tuesdays Thursdays and Saturdays and we will schedule this on a Monday.  The first available Monday is December 30.  I have discussed the procedure and potential complications with him and he is agreeable to proceed.  [name] [facility] [phone]

## 2018-05-06 DIAGNOSIS — D509 Iron deficiency anemia, unspecified: Secondary | ICD-10-CM | POA: Diagnosis not present

## 2018-05-06 DIAGNOSIS — N2581 Secondary hyperparathyroidism of renal origin: Secondary | ICD-10-CM | POA: Diagnosis not present

## 2018-05-06 DIAGNOSIS — D631 Anemia in chronic kidney disease: Secondary | ICD-10-CM | POA: Diagnosis not present

## 2018-05-06 DIAGNOSIS — E119 Type 2 diabetes mellitus without complications: Secondary | ICD-10-CM | POA: Diagnosis not present

## 2018-05-06 DIAGNOSIS — E1129 Type 2 diabetes mellitus with other diabetic kidney complication: Secondary | ICD-10-CM | POA: Diagnosis not present

## 2018-05-06 DIAGNOSIS — N186 End stage renal disease: Secondary | ICD-10-CM | POA: Diagnosis not present

## 2018-05-08 DIAGNOSIS — D631 Anemia in chronic kidney disease: Secondary | ICD-10-CM | POA: Diagnosis not present

## 2018-05-08 DIAGNOSIS — D509 Iron deficiency anemia, unspecified: Secondary | ICD-10-CM | POA: Diagnosis not present

## 2018-05-08 DIAGNOSIS — E1129 Type 2 diabetes mellitus with other diabetic kidney complication: Secondary | ICD-10-CM | POA: Diagnosis not present

## 2018-05-08 DIAGNOSIS — N186 End stage renal disease: Secondary | ICD-10-CM | POA: Diagnosis not present

## 2018-05-08 DIAGNOSIS — N2581 Secondary hyperparathyroidism of renal origin: Secondary | ICD-10-CM | POA: Diagnosis not present

## 2018-05-08 DIAGNOSIS — E119 Type 2 diabetes mellitus without complications: Secondary | ICD-10-CM | POA: Diagnosis not present

## 2018-05-11 DIAGNOSIS — D631 Anemia in chronic kidney disease: Secondary | ICD-10-CM | POA: Diagnosis not present

## 2018-05-11 DIAGNOSIS — N2581 Secondary hyperparathyroidism of renal origin: Secondary | ICD-10-CM | POA: Diagnosis not present

## 2018-05-11 DIAGNOSIS — N186 End stage renal disease: Secondary | ICD-10-CM | POA: Diagnosis not present

## 2018-05-11 DIAGNOSIS — E1129 Type 2 diabetes mellitus with other diabetic kidney complication: Secondary | ICD-10-CM | POA: Diagnosis not present

## 2018-05-11 DIAGNOSIS — D509 Iron deficiency anemia, unspecified: Secondary | ICD-10-CM | POA: Diagnosis not present

## 2018-05-11 DIAGNOSIS — E119 Type 2 diabetes mellitus without complications: Secondary | ICD-10-CM | POA: Diagnosis not present

## 2018-05-13 DIAGNOSIS — N2581 Secondary hyperparathyroidism of renal origin: Secondary | ICD-10-CM | POA: Diagnosis not present

## 2018-05-13 DIAGNOSIS — N186 End stage renal disease: Secondary | ICD-10-CM | POA: Diagnosis not present

## 2018-05-13 DIAGNOSIS — E119 Type 2 diabetes mellitus without complications: Secondary | ICD-10-CM | POA: Diagnosis not present

## 2018-05-13 DIAGNOSIS — E1129 Type 2 diabetes mellitus with other diabetic kidney complication: Secondary | ICD-10-CM | POA: Diagnosis not present

## 2018-05-13 DIAGNOSIS — D631 Anemia in chronic kidney disease: Secondary | ICD-10-CM | POA: Diagnosis not present

## 2018-05-13 DIAGNOSIS — D509 Iron deficiency anemia, unspecified: Secondary | ICD-10-CM | POA: Diagnosis not present

## 2018-05-17 DIAGNOSIS — N186 End stage renal disease: Secondary | ICD-10-CM | POA: Diagnosis not present

## 2018-05-17 DIAGNOSIS — D509 Iron deficiency anemia, unspecified: Secondary | ICD-10-CM | POA: Diagnosis not present

## 2018-05-17 DIAGNOSIS — E119 Type 2 diabetes mellitus without complications: Secondary | ICD-10-CM | POA: Diagnosis not present

## 2018-05-17 DIAGNOSIS — E1129 Type 2 diabetes mellitus with other diabetic kidney complication: Secondary | ICD-10-CM | POA: Diagnosis not present

## 2018-05-17 DIAGNOSIS — N2581 Secondary hyperparathyroidism of renal origin: Secondary | ICD-10-CM | POA: Diagnosis not present

## 2018-05-17 DIAGNOSIS — D631 Anemia in chronic kidney disease: Secondary | ICD-10-CM | POA: Diagnosis not present

## 2018-05-18 ENCOUNTER — Encounter (HOSPITAL_COMMUNITY): Payer: Self-pay | Admitting: *Deleted

## 2018-05-18 ENCOUNTER — Other Ambulatory Visit: Payer: Self-pay

## 2018-05-18 NOTE — Progress Notes (Signed)
Mr Joshua [name] denies chest pain or shortness of breath. Mr Joshua [name] states that CBG is a littl high now, but that it will be ok by Monday, states that it was 200. I instructed Mr. Joshua [name] to not take Glimepiride or Januvia the morning of surgery.  Do not take Glimopride the or the evening before. I instructed patient to check CBG after awaking and every 2 hours until arrival  to the hospital.  I Instructed patient if CBG is less than 70 to drink 1/2 cup of a clear juice. Recheck CBG in 15 minutes then call pre- op desk at [phone] for further instructions.

## 2018-05-20 DIAGNOSIS — N186 End stage renal disease: Secondary | ICD-10-CM | POA: Diagnosis not present

## 2018-05-20 DIAGNOSIS — D631 Anemia in chronic kidney disease: Secondary | ICD-10-CM | POA: Diagnosis not present

## 2018-05-20 DIAGNOSIS — N2581 Secondary hyperparathyroidism of renal origin: Secondary | ICD-10-CM | POA: Diagnosis not present

## 2018-05-20 DIAGNOSIS — E119 Type 2 diabetes mellitus without complications: Secondary | ICD-10-CM | POA: Diagnosis not present

## 2018-05-20 DIAGNOSIS — D509 Iron deficiency anemia, unspecified: Secondary | ICD-10-CM | POA: Diagnosis not present

## 2018-05-20 DIAGNOSIS — E1129 Type 2 diabetes mellitus with other diabetic kidney complication: Secondary | ICD-10-CM | POA: Diagnosis not present

## 2018-05-22 DIAGNOSIS — N186 End stage renal disease: Secondary | ICD-10-CM | POA: Diagnosis not present

## 2018-05-22 DIAGNOSIS — D631 Anemia in chronic kidney disease: Secondary | ICD-10-CM | POA: Diagnosis not present

## 2018-05-22 DIAGNOSIS — D509 Iron deficiency anemia, unspecified: Secondary | ICD-10-CM | POA: Diagnosis not present

## 2018-05-22 DIAGNOSIS — N2581 Secondary hyperparathyroidism of renal origin: Secondary | ICD-10-CM | POA: Diagnosis not present

## 2018-05-22 DIAGNOSIS — E119 Type 2 diabetes mellitus without complications: Secondary | ICD-10-CM | POA: Diagnosis not present

## 2018-05-22 DIAGNOSIS — E1129 Type 2 diabetes mellitus with other diabetic kidney complication: Secondary | ICD-10-CM | POA: Diagnosis not present

## 2018-05-24 ENCOUNTER — Ambulatory Visit (HOSPITAL_COMMUNITY)
Admission: RE | Admit: 2018-05-24 | Discharge: 2018-05-24 | Disposition: A | Discharge status: Home / Self Care | Payer: Medicare Other | Attending: Vascular Surgery | Admitting: Vascular Surgery

## 2018-05-24 ENCOUNTER — Encounter (HOSPITAL_COMMUNITY)
Admission: RE | Disposition: A | Discharge status: Home / Self Care | Payer: Self-pay | Source: Home / Self Care | Attending: Vascular Surgery

## 2018-05-24 ENCOUNTER — Encounter (HOSPITAL_COMMUNITY): Payer: Self-pay

## 2018-05-24 ENCOUNTER — Other Ambulatory Visit: Payer: Self-pay

## 2018-05-24 ENCOUNTER — Ambulatory Visit (HOSPITAL_COMMUNITY): Payer: Medicare Other | Admitting: Anesthesiology

## 2018-05-24 DIAGNOSIS — I509 Heart failure, unspecified: Secondary | ICD-10-CM | POA: Insufficient documentation

## 2018-05-24 DIAGNOSIS — Y832 Surgical operation with anastomosis, bypass or graft as the cause of abnormal reaction of the patient, or of later complication, without mention of misadventure at the time of the procedure: Secondary | ICD-10-CM | POA: Diagnosis not present

## 2018-05-24 DIAGNOSIS — Z9119 Patient's noncompliance with other medical treatment and regimen: Secondary | ICD-10-CM | POA: Insufficient documentation

## 2018-05-24 DIAGNOSIS — I132 Hypertensive heart and chronic kidney disease with heart failure and with stage 5 chronic kidney disease, or end stage renal disease: Secondary | ICD-10-CM | POA: Insufficient documentation

## 2018-05-24 DIAGNOSIS — E1122 Type 2 diabetes mellitus with diabetic chronic kidney disease: Secondary | ICD-10-CM | POA: Insufficient documentation

## 2018-05-24 DIAGNOSIS — N186 End stage renal disease: Secondary | ICD-10-CM | POA: Diagnosis not present

## 2018-05-24 DIAGNOSIS — Z7982 Long term (current) use of aspirin: Secondary | ICD-10-CM | POA: Insufficient documentation

## 2018-05-24 DIAGNOSIS — Z79899 Other long term (current) drug therapy: Secondary | ICD-10-CM | POA: Diagnosis not present

## 2018-05-24 DIAGNOSIS — G4730 Sleep apnea, unspecified: Secondary | ICD-10-CM | POA: Diagnosis not present

## 2018-05-24 DIAGNOSIS — Z7984 Long term (current) use of oral hypoglycemic drugs: Secondary | ICD-10-CM | POA: Insufficient documentation

## 2018-05-24 DIAGNOSIS — I5043 Acute on chronic combined systolic (congestive) and diastolic (congestive) heart failure: Secondary | ICD-10-CM | POA: Diagnosis not present

## 2018-05-24 DIAGNOSIS — Z992 Dependence on renal dialysis: Secondary | ICD-10-CM | POA: Diagnosis not present

## 2018-05-24 DIAGNOSIS — T82898A Other specified complication of vascular prosthetic devices, implants and grafts, initial encounter: Secondary | ICD-10-CM | POA: Diagnosis not present

## 2018-05-24 DIAGNOSIS — Z87891 Personal history of nicotine dependence: Secondary | ICD-10-CM | POA: Diagnosis not present

## 2018-05-24 HISTORY — PX: REVISON OF ARTERIOVENOUS FISTULA: SHX6074

## 2018-05-24 LAB — POCT I-STAT 4, (NA,K, GLUC, HGB,HCT)
GLUCOSE: 189 mg/dL — AB (ref 70–99)
HCT: 33 % — ABNORMAL LOW (ref 39.0–52.0)
Hemoglobin: 11.2 g/dL — ABNORMAL LOW (ref 13.0–17.0)
Potassium: 3.8 mmol/L (ref 3.5–5.1)
Sodium: 134 mmol/L — ABNORMAL LOW (ref 135–145)

## 2018-05-24 LAB — GLUCOSE, CAPILLARY
Glucose-Capillary: 158 mg/dL — ABNORMAL HIGH (ref 70–99)
Glucose-Capillary: 143 mg/dL — ABNORMAL HIGH (ref 70–99)

## 2018-05-24 SURGERY — REVISON OF ARTERIOVENOUS FISTULA
Anesthesia: General | Site: Arm Upper | Laterality: Left

## 2018-05-24 MED ORDER — OXYCODONE HCL 5 MG/5ML PO SOLN: 5.0000 mg | Freq: Once | ORAL | Status: DC | PRN

## 2018-05-24 MED ORDER — LIDOCAINE 2% (20 MG/ML) 5 ML SYRINGE
INTRAMUSCULAR | Status: AC
  Filled 2018-05-24: qty 5

## 2018-05-24 MED ORDER — CARVEDILOL 25 MG PO TABS
25.0000 mg | ORAL_TABLET | Freq: Once | ORAL | Status: AC
  Administered 2018-05-24: 25 mg via ORAL
  Filled 2018-05-24: qty 1

## 2018-05-24 MED ORDER — PROTAMINE SULFATE 10 MG/ML IV SOLN
INTRAVENOUS | Status: AC
  Filled 2018-05-24: qty 5

## 2018-05-24 MED ORDER — SODIUM CHLORIDE 0.9 % IV SOLN
INTRAVENOUS | Status: DC | PRN
  Administered 2018-05-24: 25 ug/min via INTRAVENOUS

## 2018-05-24 MED ORDER — SODIUM CHLORIDE 0.9 % IV SOLN
INTRAVENOUS | Status: DC | PRN
  Administered 2018-05-24: 10:00:00

## 2018-05-24 MED ORDER — OXYCODONE-ACETAMINOPHEN 5-325 MG PO TABS: 1.0000 | ORAL_TABLET | Freq: Four times a day (QID) | ORAL | 0 refills | Status: DC | PRN

## 2018-05-24 MED ORDER — FENTANYL CITRATE (PF) 100 MCG/2ML IJ SOLN: 25.0000 ug | INTRAMUSCULAR | Status: DC | PRN

## 2018-05-24 MED ORDER — SODIUM CHLORIDE 0.9 % IV SOLN
INTRAVENOUS | Status: AC
  Filled 2018-05-24: qty 1.2

## 2018-05-24 MED ORDER — PROPOFOL 10 MG/ML IV BOLUS
INTRAVENOUS | Status: AC
  Filled 2018-05-24: qty 20

## 2018-05-24 MED ORDER — MIDAZOLAM HCL 2 MG/2ML IJ SOLN
INTRAMUSCULAR | Status: DC | PRN
  Administered 2018-05-24: 2 mg via INTRAVENOUS

## 2018-05-24 MED ORDER — SODIUM CHLORIDE 0.9 % IV SOLN
INTRAVENOUS | Status: DC
  Administered 2018-05-24: 09:00:00 via INTRAVENOUS

## 2018-05-24 MED ORDER — ONDANSETRON HCL 4 MG/2ML IJ SOLN
INTRAMUSCULAR | Status: AC
  Filled 2018-05-24: qty 2

## 2018-05-24 MED ORDER — PROTAMINE SULFATE 10 MG/ML IV SOLN
INTRAVENOUS | Status: DC | PRN
  Administered 2018-05-24: 50 mg via INTRAVENOUS

## 2018-05-24 MED ORDER — CEFAZOLIN SODIUM-DEXTROSE 2-4 GM/100ML-% IV SOLN
2.0000 g | INTRAVENOUS | Status: AC
  Administered 2018-05-24: 2 g via INTRAVENOUS
  Filled 2018-05-24: qty 100

## 2018-05-24 MED ORDER — FENTANYL CITRATE (PF) 100 MCG/2ML IJ SOLN
INTRAMUSCULAR | Status: DC | PRN
  Administered 2018-05-24 (×2): 50 ug via INTRAVENOUS

## 2018-05-24 MED ORDER — HEPARIN SODIUM (PORCINE) 1000 UNIT/ML IJ SOLN
INTRAMUSCULAR | Status: DC | PRN
  Administered 2018-05-24: 10000 [IU] via INTRAVENOUS

## 2018-05-24 MED ORDER — LIDOCAINE-EPINEPHRINE (PF) 1 %-1:200000 IJ SOLN
INTRAMUSCULAR | Status: AC
  Filled 2018-05-24: qty 30

## 2018-05-24 MED ORDER — OXYCODONE HCL 5 MG PO TABS: 5.0000 mg | ORAL_TABLET | Freq: Once | ORAL | Status: DC | PRN

## 2018-05-24 MED ORDER — LIDOCAINE-EPINEPHRINE (PF) 1 %-1:200000 IJ SOLN
INTRAMUSCULAR | Status: DC | PRN
  Administered 2018-05-24: 30 mL

## 2018-05-24 MED ORDER — PHENYLEPHRINE 40 MCG/ML (10ML) SYRINGE FOR IV PUSH (FOR BLOOD PRESSURE SUPPORT)
PREFILLED_SYRINGE | INTRAVENOUS | Status: DC | PRN
  Administered 2018-05-24: 120 ug via INTRAVENOUS
  Administered 2018-05-24: 80 ug via INTRAVENOUS
  Administered 2018-05-24: 120 ug via INTRAVENOUS
  Administered 2018-05-24: 80 ug via INTRAVENOUS

## 2018-05-24 MED ORDER — PROPOFOL 10 MG/ML IV BOLUS
INTRAVENOUS | Status: DC | PRN
  Administered 2018-05-24: 150 mg via INTRAVENOUS

## 2018-05-24 MED ORDER — ONDANSETRON HCL 4 MG/2ML IJ SOLN: 4.0000 mg | Freq: Once | INTRAMUSCULAR | Status: DC | PRN

## 2018-05-24 MED ORDER — CARVEDILOL 12.5 MG PO TABS
ORAL_TABLET | ORAL | Status: AC
  Filled 2018-05-24: qty 2

## 2018-05-24 MED ORDER — MIDAZOLAM HCL 2 MG/2ML IJ SOLN
INTRAMUSCULAR | Status: AC
  Filled 2018-05-24: qty 2

## 2018-05-24 MED ORDER — ONDANSETRON HCL 4 MG/2ML IJ SOLN
INTRAMUSCULAR | Status: DC | PRN
  Administered 2018-05-24: 4 mg via INTRAVENOUS

## 2018-05-24 MED ORDER — HEPARIN SODIUM (PORCINE) 1000 UNIT/ML IJ SOLN
INTRAMUSCULAR | Status: AC
  Filled 2018-05-24: qty 1

## 2018-05-24 MED ORDER — FENTANYL CITRATE (PF) 250 MCG/5ML IJ SOLN
INTRAMUSCULAR | Status: AC
  Filled 2018-05-24: qty 5

## 2018-05-24 MED ORDER — SODIUM CHLORIDE 0.9 % IR SOLN
Status: DC | PRN
  Administered 2018-05-24: 1000 mL

## 2018-05-24 SURGICAL SUPPLY — 38 items
ARMBAND PINK RESTRICT EXTREMIT (MISCELLANEOUS) ×3 IMPLANT
CANISTER SUCT 3000ML PPV (MISCELLANEOUS) ×3 IMPLANT
CANNULA VESSEL 3MM 2 BLNT TIP (CANNULA) ×3 IMPLANT
CLIP VESOCCLUDE MED 6/CT (CLIP) ×3 IMPLANT
CLIP VESOCCLUDE SM WIDE 6/CT (CLIP) ×3 IMPLANT
COVER PROBE W GEL 5X96 (DRAPES) IMPLANT
COVER WAND RF STERILE (DRAPES) ×3 IMPLANT
DERMABOND ADVANCED (GAUZE/BANDAGES/DRESSINGS) ×2
DERMABOND ADVANCED .7 DNX12 (GAUZE/BANDAGES/DRESSINGS) ×1 IMPLANT
ELECT REM PT RETURN 9FT ADLT (ELECTROSURGICAL) ×3
ELECTRODE REM PT RTRN 9FT ADLT (ELECTROSURGICAL) ×1 IMPLANT
GLOVE BIO SURGEON STRL SZ 6 (GLOVE) ×3 IMPLANT
GLOVE BIO SURGEON STRL SZ 6.5 (GLOVE) ×2 IMPLANT
GLOVE BIO SURGEON STRL SZ7.5 (GLOVE) ×3 IMPLANT
GLOVE BIO SURGEONS STRL SZ 6.5 (GLOVE) ×1
GLOVE BIOGEL PI IND STRL 6.5 (GLOVE) ×4 IMPLANT
GLOVE BIOGEL PI IND STRL 7.0 (GLOVE) ×1 IMPLANT
GLOVE BIOGEL PI IND STRL 8 (GLOVE) ×1 IMPLANT
GLOVE BIOGEL PI INDICATOR 6.5 (GLOVE) ×8
GLOVE BIOGEL PI INDICATOR 7.0 (GLOVE) ×2
GLOVE BIOGEL PI INDICATOR 8 (GLOVE) ×2
GLOVE ECLIPSE 6.5 STRL STRAW (GLOVE) ×3 IMPLANT
GOWN STRL REUS W/ TWL LRG LVL3 (GOWN DISPOSABLE) ×4 IMPLANT
GOWN STRL REUS W/TWL LRG LVL3 (GOWN DISPOSABLE) ×8
KIT BASIN OR (CUSTOM PROCEDURE TRAY) ×3 IMPLANT
KIT TURNOVER KIT B (KITS) ×3 IMPLANT
NS IRRIG 1000ML POUR BTL (IV SOLUTION) ×3 IMPLANT
PACK CV ACCESS (CUSTOM PROCEDURE TRAY) ×3 IMPLANT
PAD ARMBOARD 7.5X6 YLW CONV (MISCELLANEOUS) ×6 IMPLANT
SPONGE SURGIFOAM ABS GEL 100 (HEMOSTASIS) IMPLANT
SUT PROLENE 5 0 C 1 24 (SUTURE) ×9 IMPLANT
SUT PROLENE 6 0 BV (SUTURE) ×3 IMPLANT
SUT VIC AB 3-0 SH 27 (SUTURE) ×2
SUT VIC AB 3-0 SH 27X BRD (SUTURE) ×1 IMPLANT
SUT VICRYL 4-0 PS2 18IN ABS (SUTURE) ×3 IMPLANT
TOWEL GREEN STERILE (TOWEL DISPOSABLE) ×3 IMPLANT
UNDERPAD 30X30 (UNDERPADS AND DIAPERS) ×3 IMPLANT
WATER STERILE IRR 1000ML POUR (IV SOLUTION) ×3 IMPLANT

## 2018-05-24 NOTE — Discharge Instructions (Signed)
[facility]  Discharge Instructions  AV Fistula or Graft Surgery for Dialysis Access  Please refer to the following instructions for your post-procedure care. Your surgeon or physician assistant will discuss any changes with you.  Activity  You may drive the day following your surgery, if you are comfortable and no longer taking prescription pain medication. Resume full activity as the soreness in your incision resolves.  Bathing/Showering  You may shower after you go home. Keep your incision dry for 48 hours. Do not soak in a bathtub, hot tub, or swim until the incision heals completely. You may not shower if you have a hemodialysis catheter.  Incision Care  Clean your incision with mild soap and water after 48 hours. Pat the area dry with a clean towel. You do not need a bandage unless otherwise instructed. Do not apply any ointments or creams to your incision. You may have skin glue on your incision. Do not peel it off. It will come off on its own in about one week. Your arm may swell a bit after surgery. To reduce swelling use pillows to elevate your arm so it is above your heart. Your doctor will tell you if you need to lightly wrap your arm with an ACE bandage.  Diet  Resume your normal diet. There are not special food restrictions following this procedure. In order to heal from your surgery, it is CRITICAL to get adequate nutrition. Your body requires vitamins, minerals, and protein. Vegetables are the best source of vitamins and minerals. Vegetables also provide the perfect balance of protein. Processed food has little nutritional value, so try to avoid this.  Medications  Resume taking all of your medications. If your incision is causing pain, you may take over-the counter pain relievers such as acetaminophen (Tylenol). If you were prescribed a stronger pain medication, please be aware these medications can cause nausea and constipation. Prevent  nausea by taking the medication with a snack or meal. Avoid constipation by drinking plenty of fluids and eating foods with high amount of fiber, such as fruits, vegetables, and grains.  Do not take Tylenol if you are taking prescription pain medications.  Follow up Your surgeon may want to see you in the office following your access surgery. If so, this will be arranged at the time of your surgery.  Please call us immediately for any of the following conditions:  Increased pain, redness, drainage (pus) from your incision site Fever of 101 degrees or higher Severe or worsening pain at your incision site Hand pain or numbness.  Reduce your risk of vascular disease:  Stop smoking. If you would like help, call [facility] at 1-800-QUIT-NOW 514 569 8082) or [place] at [place] your cholesterol Maintain a desired weight Control your diabetes Keep your blood pressure down  Dialysis  It will take several weeks to several months for your new dialysis access to be ready for use. Your surgeon will determine when it is okay to use it. Your nephrologist will continue to direct your dialysis. You can continue to use your Permcath until your new access is ready for use.   05/24/2018 Joshua [name] [id number] [birth year]  Surgeon(s): Angelia Mould, MD  Procedure(s): REVISION PLICATION OF ARTERIOVENOUS FISTULA LEFT UPPER ARM  x May stick graft on designated area only:  Do NOT stick over incision x 12 weeks. May stick above and below incision immediately. SEE DIAGRAM.   If you have any questions, please call  the office at [phone].   Post Anesthesia Home Care Instructions  Activity: Get plenty of rest for the remainder of the day. A responsible individual must stay with you for 24 hours following the procedure.  For the next 24 hours, DO NOT: -Drive a car -Paediatric nurse -Drink alcoholic beverages -Take any medication unless instructed by your  physician -Make any legal decisions or sign important papers.  Meals: Start with liquid foods such as gelatin or soup. Progress to regular foods as tolerated. Avoid greasy, spicy, heavy foods. If nausea and/or vomiting occur, drink only clear liquids until the nausea and/or vomiting subsides. Call your physician if vomiting continues.  Special Instructions/Symptoms: Your throat may feel dry or sore from the anesthesia or the breathing tube placed in your throat during surgery. If this causes discomfort, gargle with warm salt water. The discomfort should disappear within 24 hours.  If you had a scopolamine patch placed behind your ear for the management of post- operative nausea and/or vomiting:  1. The medication in the patch is effective for 72 hours, after which it should be removed.  Wrap patch in a tissue and discard in the trash. Wash hands thoroughly with soap and water. 2. You may remove the patch earlier than 72 hours if you experience unpleasant side effects which may include dry mouth, dizziness or visual disturbances. 3. Avoid touching the patch. Wash your hands with soap and water after contact with the patch.

## 2018-05-24 NOTE — Anesthesia Preprocedure Evaluation (Addendum)
Anesthesia Evaluation  Patient identified by MRN, date of birth, ID band Patient awake    Reviewed: Allergy & Precautions, NPO status , Patient's Chart, lab work & pertinent test results, reviewed documented beta blocker date and time   History of Anesthesia Complications Negative for: history of anesthetic complications  Airway Mallampati: II  TM Distance: >3 FB Neck ROM: Full    Dental  (+) Dental Advisory Given, Chipped,    Pulmonary sleep apnea (noncompliant) , former smoker,    breath sounds clear to auscultation       Cardiovascular hypertension, Pt. on medications and Pt. on home beta blockers +CHF   Rhythm:Regular Rate:Normal   '19 TTE - LV cavity size was mildly dilated. Moderate concentric hypertrophy. EF 40% to 45%. Mild diffuse hypokinesis with no identifiable regional variations. Grade 2 diastolic dysfunction. LA was moderately to severely dilated.    Neuro/Psych  Headaches, PSYCHIATRIC DISORDERS Anxiety    GI/Hepatic Neg liver ROS, GERD  Medicated and Controlled,  Endo/Other  diabetes, Type 2, Oral Hypoglycemic Agents Obesity   Renal/GU ESRF and DialysisRenal disease     Musculoskeletal  (+) Arthritis ,   Abdominal (+) + obese,   Peds  Hematology negative hematology ROS (+)   Anesthesia Other Findings   Reproductive/Obstetrics                            Anesthesia Physical Anesthesia Plan  ASA: III  Anesthesia Plan: General   Post-op Pain Management:    Induction: Intravenous  PONV Risk Score and Plan: 2 and Treatment may vary due to age or medical condition, Ondansetron and Midazolam  Airway Management Planned: LMA  Additional Equipment: None  Intra-op Plan:   Post-operative Plan: Extubation in OR  Informed Consent: I have reviewed the patients History and Physical, chart, labs and discussed the procedure including the risks, benefits and alternatives for the  proposed anesthesia with the patient or authorized representative who has indicated his/her understanding and acceptance.   Dental advisory given  Plan Discussed with: CRNA and Anesthesiologist  Anesthesia Plan Comments:        Anesthesia Quick Evaluation

## 2018-05-24 NOTE — Progress Notes (Addendum)
Called Dr. Fransisco Beau regarding patients BP of 207/102 and 179/106- no answer, will call back.  0830- spoke with Dr. Fransisco Beau- aware of patients BP's will evaluate and continue to monitor.

## 2018-05-24 NOTE — Anesthesia Postprocedure Evaluation (Signed)
Anesthesia Post Note  Patient: Joshua [name].  Procedure(s) Performed: REVISION PLICATION OF ARTERIOVENOUS FISTULA LEFT UPPER ARM (Left Arm Upper)     Patient location during evaluation: PACU Anesthesia Type: General Level of consciousness: awake and alert Pain management: pain level controlled Vital Signs Assessment: post-procedure vital signs reviewed and stable Respiratory status: spontaneous breathing, nonlabored ventilation and respiratory function stable Cardiovascular status: blood pressure returned to baseline and stable Postop Assessment: no apparent nausea or vomiting Anesthetic complications: no    Last Vitals:  Vitals:   05/24/18 1148 05/24/18 1201  BP: (!) 156/90 130/73  Pulse: 85 85  Resp: (!) 43 15  Temp: (!) 36.4 C   SpO2: 94% 95%    Last Pain:  Vitals:   05/24/18 1201  TempSrc:   PainSc: 0-No pain                 Audry Pili

## 2018-05-24 NOTE — Op Note (Signed)
    NAME: Joshua [name].    MRN: [id number] DOB: 04-03-1981    DATE OF OPERATION: 05/24/2018  PREOP DIAGNOSIS:    Large aneurysmal left upper arm fistula  POSTOP DIAGNOSIS:    Same  PROCEDURE:    Plication of large left upper arm AV fistula  SURGEON: Judeth Cornfield. Scot Dock, MD, FACS  ASSIST: Leontine Locket, PA  ANESTHESIA: General  EBL: Minimal  INDICATIONS:    Joshua [name]. is a 37 y.o. male who has a large aneurysmal upper arm fistula on the left.  He has 3 large aneurysms.  The central aneurysm had some compromise of the skin.  I recommended ligation of the fistula and placement of new access given the large aneurysmal degenerative fistula however he wanted to continue to try to use this if at all possible.  Therefore I recommended plication of the central aneurysm thus leaving room above and below this for hemodialysis access.  FINDINGS:   Good thrill at the completion of the procedure.  TECHNIQUE:   The patient was taken to the operating room and received a general anesthetic.  The entire left upper extremity was prepped and draped in usual sterile fashion.  An elliptical incision was made over this large central aneurysm.  The aneurysm was dissected free circumferentially.  The patient was heparinized.  The fistula was clamped proximally and distally and opened along its lateral wall.  A large ellipse of this aneurysmal fistula was excised and then I sewed the fistula back with a running 5-0 Prolene suture trying to rotate the suture line laterally and posterior to protect it from future cannulation.  At the completion the clamps were released and there was a good thrill in the fistula.  Hemostasis was obtained in the wound and the wound was then closed with 2 deep layers of 3-0 Vicryl and the skin closed with 4-0 Vicryl.  Dermabond was applied.  The patient tolerated the procedure well and was transferred to the recovery room in stable condition.  All needle and  sponge counts were correct.  [name], MD, FACS [facility]  DATE OF DICTATION:   05/24/2018

## 2018-05-24 NOTE — Transfer of Care (Signed)
Immediate Anesthesia Transfer of Care Note  Patient: Amarien Carne.  Procedure(s) Performed: REVISION PLICATION OF ARTERIOVENOUS FISTULA LEFT UPPER ARM (Left Arm Upper)  Patient Location: PACU  Anesthesia Type:General  Level of Consciousness: awake, alert  and oriented  Airway & Oxygen Therapy: Patient Spontanous Breathing and Patient connected to face mask oxygen  Post-op Assessment: Report given to RN and Post -op Vital signs reviewed and stable  Post vital signs: Reviewed and stable  Last Vitals:  Vitals Value Taken Time  BP    Temp    Pulse 85 05/24/2018 11:45 AM  Resp    SpO2 93 % 05/24/2018 11:45 AM  Vitals shown include unvalidated device data.  Last Pain:  Vitals:   05/24/18 0715  TempSrc:   PainSc: 0-No pain      Patients Stated Pain Goal: 3 (58/31/67 4255)  Complications: No apparent anesthesia complications

## 2018-05-24 NOTE — Interval H&P Note (Signed)
History and Physical Interval Note:  05/24/2018 9:52 AM  Joshua [name].  has presented today for surgery, with the diagnosis of fistula aneurysm  The various methods of treatment have been discussed with the patient and family. After consideration of risks, benefits and other options for treatment, the patient has consented to  Procedure(s): [place] (Left) as a surgical intervention .  The patient's history has been reviewed, patient examined, no change in status, stable for surgery.  I have reviewed the patient's chart and labs.  Questions were answered to the patient's satisfaction.     [name]

## 2018-05-24 NOTE — Anesthesia Procedure Notes (Signed)
Procedure Name: LMA Insertion Date/Time: 05/24/2018 10:15 AM Performed by: Trinna Post., CRNA Pre-anesthesia Checklist: Patient identified, Emergency Drugs available, Suction available, Patient being monitored and Timeout performed Patient Re-evaluated:Patient Re-evaluated prior to induction Oxygen Delivery Method: Circle system utilized Preoxygenation: Pre-oxygenation with 100% oxygen Induction Type: IV induction Ventilation: Mask ventilation without difficulty LMA: LMA inserted LMA Size: 5.0 Number of attempts: 1 Placement Confirmation: positive ETCO2 and breath sounds checked- equal and bilateral Tube secured with: Tape Dental Injury: Teeth and Oropharynx as per pre-operative assessment

## 2018-05-25 ENCOUNTER — Encounter (HOSPITAL_COMMUNITY): Payer: Self-pay | Admitting: Vascular Surgery

## 2018-05-25 DIAGNOSIS — N2581 Secondary hyperparathyroidism of renal origin: Secondary | ICD-10-CM | POA: Diagnosis not present

## 2018-05-25 DIAGNOSIS — E119 Type 2 diabetes mellitus without complications: Secondary | ICD-10-CM | POA: Diagnosis not present

## 2018-05-25 DIAGNOSIS — D509 Iron deficiency anemia, unspecified: Secondary | ICD-10-CM | POA: Diagnosis not present

## 2018-05-25 DIAGNOSIS — D631 Anemia in chronic kidney disease: Secondary | ICD-10-CM | POA: Diagnosis not present

## 2018-05-25 DIAGNOSIS — E1129 Type 2 diabetes mellitus with other diabetic kidney complication: Secondary | ICD-10-CM | POA: Diagnosis not present

## 2018-05-25 DIAGNOSIS — N186 End stage renal disease: Secondary | ICD-10-CM | POA: Diagnosis not present

## 2018-05-26 DIAGNOSIS — Z992 Dependence on renal dialysis: Secondary | ICD-10-CM | POA: Diagnosis not present

## 2018-05-26 DIAGNOSIS — N186 End stage renal disease: Secondary | ICD-10-CM | POA: Diagnosis not present

## 2018-05-26 DIAGNOSIS — I120 Hypertensive chronic kidney disease with stage 5 chronic kidney disease or end stage renal disease: Secondary | ICD-10-CM | POA: Diagnosis not present

## 2018-05-27 DIAGNOSIS — D509 Iron deficiency anemia, unspecified: Secondary | ICD-10-CM | POA: Diagnosis not present

## 2018-05-27 DIAGNOSIS — N186 End stage renal disease: Secondary | ICD-10-CM | POA: Diagnosis not present

## 2018-05-27 DIAGNOSIS — E119 Type 2 diabetes mellitus without complications: Secondary | ICD-10-CM | POA: Diagnosis not present

## 2018-05-27 DIAGNOSIS — N2581 Secondary hyperparathyroidism of renal origin: Secondary | ICD-10-CM | POA: Diagnosis not present

## 2018-05-27 DIAGNOSIS — D631 Anemia in chronic kidney disease: Secondary | ICD-10-CM | POA: Diagnosis not present

## 2018-05-29 DIAGNOSIS — D509 Iron deficiency anemia, unspecified: Secondary | ICD-10-CM | POA: Diagnosis not present

## 2018-05-29 DIAGNOSIS — D631 Anemia in chronic kidney disease: Secondary | ICD-10-CM | POA: Diagnosis not present

## 2018-05-29 DIAGNOSIS — N2581 Secondary hyperparathyroidism of renal origin: Secondary | ICD-10-CM | POA: Diagnosis not present

## 2018-05-29 DIAGNOSIS — E119 Type 2 diabetes mellitus without complications: Secondary | ICD-10-CM | POA: Diagnosis not present

## 2018-05-29 DIAGNOSIS — N186 End stage renal disease: Secondary | ICD-10-CM | POA: Diagnosis not present

## 2018-06-01 DIAGNOSIS — E119 Type 2 diabetes mellitus without complications: Secondary | ICD-10-CM | POA: Diagnosis not present

## 2018-06-01 DIAGNOSIS — D509 Iron deficiency anemia, unspecified: Secondary | ICD-10-CM | POA: Diagnosis not present

## 2018-06-01 DIAGNOSIS — N2581 Secondary hyperparathyroidism of renal origin: Secondary | ICD-10-CM | POA: Diagnosis not present

## 2018-06-01 DIAGNOSIS — N186 End stage renal disease: Secondary | ICD-10-CM | POA: Diagnosis not present

## 2018-06-01 DIAGNOSIS — D631 Anemia in chronic kidney disease: Secondary | ICD-10-CM | POA: Diagnosis not present

## 2018-06-05 DIAGNOSIS — E119 Type 2 diabetes mellitus without complications: Secondary | ICD-10-CM | POA: Diagnosis not present

## 2018-06-05 DIAGNOSIS — N2581 Secondary hyperparathyroidism of renal origin: Secondary | ICD-10-CM | POA: Diagnosis not present

## 2018-06-05 DIAGNOSIS — D509 Iron deficiency anemia, unspecified: Secondary | ICD-10-CM | POA: Diagnosis not present

## 2018-06-05 DIAGNOSIS — E1129 Type 2 diabetes mellitus with other diabetic kidney complication: Secondary | ICD-10-CM | POA: Diagnosis not present

## 2018-06-05 DIAGNOSIS — D631 Anemia in chronic kidney disease: Secondary | ICD-10-CM | POA: Diagnosis not present

## 2018-06-05 DIAGNOSIS — N186 End stage renal disease: Secondary | ICD-10-CM | POA: Diagnosis not present

## 2018-06-08 DIAGNOSIS — N2581 Secondary hyperparathyroidism of renal origin: Secondary | ICD-10-CM | POA: Diagnosis not present

## 2018-06-08 DIAGNOSIS — D509 Iron deficiency anemia, unspecified: Secondary | ICD-10-CM | POA: Diagnosis not present

## 2018-06-08 DIAGNOSIS — E119 Type 2 diabetes mellitus without complications: Secondary | ICD-10-CM | POA: Diagnosis not present

## 2018-06-08 DIAGNOSIS — D631 Anemia in chronic kidney disease: Secondary | ICD-10-CM | POA: Diagnosis not present

## 2018-06-08 DIAGNOSIS — N186 End stage renal disease: Secondary | ICD-10-CM | POA: Diagnosis not present

## 2018-06-09 ENCOUNTER — Ambulatory Visit: Payer: Medicare Other | Admitting: Vascular Surgery

## 2018-06-10 DIAGNOSIS — D509 Iron deficiency anemia, unspecified: Secondary | ICD-10-CM | POA: Diagnosis not present

## 2018-06-10 DIAGNOSIS — E119 Type 2 diabetes mellitus without complications: Secondary | ICD-10-CM | POA: Diagnosis not present

## 2018-06-10 DIAGNOSIS — N186 End stage renal disease: Secondary | ICD-10-CM | POA: Diagnosis not present

## 2018-06-10 DIAGNOSIS — D631 Anemia in chronic kidney disease: Secondary | ICD-10-CM | POA: Diagnosis not present

## 2018-06-10 DIAGNOSIS — N2581 Secondary hyperparathyroidism of renal origin: Secondary | ICD-10-CM | POA: Diagnosis not present

## 2018-06-12 DIAGNOSIS — N186 End stage renal disease: Secondary | ICD-10-CM | POA: Diagnosis not present

## 2018-06-12 DIAGNOSIS — N2581 Secondary hyperparathyroidism of renal origin: Secondary | ICD-10-CM | POA: Diagnosis not present

## 2018-06-12 DIAGNOSIS — D631 Anemia in chronic kidney disease: Secondary | ICD-10-CM | POA: Diagnosis not present

## 2018-06-12 DIAGNOSIS — E119 Type 2 diabetes mellitus without complications: Secondary | ICD-10-CM | POA: Diagnosis not present

## 2018-06-12 DIAGNOSIS — D509 Iron deficiency anemia, unspecified: Secondary | ICD-10-CM | POA: Diagnosis not present

## 2018-06-15 DIAGNOSIS — N2581 Secondary hyperparathyroidism of renal origin: Secondary | ICD-10-CM | POA: Diagnosis not present

## 2018-06-15 DIAGNOSIS — E119 Type 2 diabetes mellitus without complications: Secondary | ICD-10-CM | POA: Diagnosis not present

## 2018-06-15 DIAGNOSIS — D509 Iron deficiency anemia, unspecified: Secondary | ICD-10-CM | POA: Diagnosis not present

## 2018-06-15 DIAGNOSIS — N186 End stage renal disease: Secondary | ICD-10-CM | POA: Diagnosis not present

## 2018-06-15 DIAGNOSIS — D631 Anemia in chronic kidney disease: Secondary | ICD-10-CM | POA: Diagnosis not present

## 2018-06-17 DIAGNOSIS — N186 End stage renal disease: Secondary | ICD-10-CM | POA: Diagnosis not present

## 2018-06-17 DIAGNOSIS — E119 Type 2 diabetes mellitus without complications: Secondary | ICD-10-CM | POA: Diagnosis not present

## 2018-06-17 DIAGNOSIS — D509 Iron deficiency anemia, unspecified: Secondary | ICD-10-CM | POA: Diagnosis not present

## 2018-06-17 DIAGNOSIS — N2581 Secondary hyperparathyroidism of renal origin: Secondary | ICD-10-CM | POA: Diagnosis not present

## 2018-06-17 DIAGNOSIS — D631 Anemia in chronic kidney disease: Secondary | ICD-10-CM | POA: Diagnosis not present

## 2018-06-19 DIAGNOSIS — D509 Iron deficiency anemia, unspecified: Secondary | ICD-10-CM | POA: Diagnosis not present

## 2018-06-19 DIAGNOSIS — E119 Type 2 diabetes mellitus without complications: Secondary | ICD-10-CM | POA: Diagnosis not present

## 2018-06-19 DIAGNOSIS — D631 Anemia in chronic kidney disease: Secondary | ICD-10-CM | POA: Diagnosis not present

## 2018-06-19 DIAGNOSIS — N2581 Secondary hyperparathyroidism of renal origin: Secondary | ICD-10-CM | POA: Diagnosis not present

## 2018-06-19 DIAGNOSIS — N186 End stage renal disease: Secondary | ICD-10-CM | POA: Diagnosis not present

## 2018-06-22 DIAGNOSIS — N2581 Secondary hyperparathyroidism of renal origin: Secondary | ICD-10-CM | POA: Diagnosis not present

## 2018-06-22 DIAGNOSIS — E119 Type 2 diabetes mellitus without complications: Secondary | ICD-10-CM | POA: Diagnosis not present

## 2018-06-22 DIAGNOSIS — D509 Iron deficiency anemia, unspecified: Secondary | ICD-10-CM | POA: Diagnosis not present

## 2018-06-22 DIAGNOSIS — D631 Anemia in chronic kidney disease: Secondary | ICD-10-CM | POA: Diagnosis not present

## 2018-06-22 DIAGNOSIS — N186 End stage renal disease: Secondary | ICD-10-CM | POA: Diagnosis not present

## 2018-06-26 DIAGNOSIS — D509 Iron deficiency anemia, unspecified: Secondary | ICD-10-CM | POA: Diagnosis not present

## 2018-06-26 DIAGNOSIS — I120 Hypertensive chronic kidney disease with stage 5 chronic kidney disease or end stage renal disease: Secondary | ICD-10-CM | POA: Diagnosis not present

## 2018-06-26 DIAGNOSIS — Z992 Dependence on renal dialysis: Secondary | ICD-10-CM | POA: Diagnosis not present

## 2018-06-26 DIAGNOSIS — E119 Type 2 diabetes mellitus without complications: Secondary | ICD-10-CM | POA: Diagnosis not present

## 2018-06-26 DIAGNOSIS — N186 End stage renal disease: Secondary | ICD-10-CM | POA: Diagnosis not present

## 2018-06-26 DIAGNOSIS — N2581 Secondary hyperparathyroidism of renal origin: Secondary | ICD-10-CM | POA: Diagnosis not present

## 2018-06-26 DIAGNOSIS — D631 Anemia in chronic kidney disease: Secondary | ICD-10-CM | POA: Diagnosis not present

## 2018-06-29 DIAGNOSIS — N186 End stage renal disease: Secondary | ICD-10-CM | POA: Diagnosis not present

## 2018-06-29 DIAGNOSIS — N2581 Secondary hyperparathyroidism of renal origin: Secondary | ICD-10-CM | POA: Diagnosis not present

## 2018-06-29 DIAGNOSIS — D631 Anemia in chronic kidney disease: Secondary | ICD-10-CM | POA: Diagnosis not present

## 2018-06-29 DIAGNOSIS — E119 Type 2 diabetes mellitus without complications: Secondary | ICD-10-CM | POA: Diagnosis not present

## 2018-06-29 DIAGNOSIS — D509 Iron deficiency anemia, unspecified: Secondary | ICD-10-CM | POA: Diagnosis not present

## 2018-07-01 DIAGNOSIS — D631 Anemia in chronic kidney disease: Secondary | ICD-10-CM | POA: Diagnosis not present

## 2018-07-01 DIAGNOSIS — E119 Type 2 diabetes mellitus without complications: Secondary | ICD-10-CM | POA: Diagnosis not present

## 2018-07-01 DIAGNOSIS — N2581 Secondary hyperparathyroidism of renal origin: Secondary | ICD-10-CM | POA: Diagnosis not present

## 2018-07-01 DIAGNOSIS — N186 End stage renal disease: Secondary | ICD-10-CM | POA: Diagnosis not present

## 2018-07-01 DIAGNOSIS — D509 Iron deficiency anemia, unspecified: Secondary | ICD-10-CM | POA: Diagnosis not present

## 2018-07-03 DIAGNOSIS — D631 Anemia in chronic kidney disease: Secondary | ICD-10-CM | POA: Diagnosis not present

## 2018-07-03 DIAGNOSIS — D509 Iron deficiency anemia, unspecified: Secondary | ICD-10-CM | POA: Diagnosis not present

## 2018-07-03 DIAGNOSIS — E119 Type 2 diabetes mellitus without complications: Secondary | ICD-10-CM | POA: Diagnosis not present

## 2018-07-03 DIAGNOSIS — N2581 Secondary hyperparathyroidism of renal origin: Secondary | ICD-10-CM | POA: Diagnosis not present

## 2018-07-03 DIAGNOSIS — N186 End stage renal disease: Secondary | ICD-10-CM | POA: Diagnosis not present

## 2018-07-06 DIAGNOSIS — E119 Type 2 diabetes mellitus without complications: Secondary | ICD-10-CM | POA: Diagnosis not present

## 2018-07-06 DIAGNOSIS — D631 Anemia in chronic kidney disease: Secondary | ICD-10-CM | POA: Diagnosis not present

## 2018-07-06 DIAGNOSIS — N186 End stage renal disease: Secondary | ICD-10-CM | POA: Diagnosis not present

## 2018-07-06 DIAGNOSIS — N2581 Secondary hyperparathyroidism of renal origin: Secondary | ICD-10-CM | POA: Diagnosis not present

## 2018-07-06 DIAGNOSIS — D509 Iron deficiency anemia, unspecified: Secondary | ICD-10-CM | POA: Diagnosis not present

## 2018-07-10 DIAGNOSIS — E119 Type 2 diabetes mellitus without complications: Secondary | ICD-10-CM | POA: Diagnosis not present

## 2018-07-10 DIAGNOSIS — D509 Iron deficiency anemia, unspecified: Secondary | ICD-10-CM | POA: Diagnosis not present

## 2018-07-10 DIAGNOSIS — D631 Anemia in chronic kidney disease: Secondary | ICD-10-CM | POA: Diagnosis not present

## 2018-07-10 DIAGNOSIS — N186 End stage renal disease: Secondary | ICD-10-CM | POA: Diagnosis not present

## 2018-07-10 DIAGNOSIS — N2581 Secondary hyperparathyroidism of renal origin: Secondary | ICD-10-CM | POA: Diagnosis not present

## 2018-07-13 DIAGNOSIS — E119 Type 2 diabetes mellitus without complications: Secondary | ICD-10-CM | POA: Diagnosis not present

## 2018-07-13 DIAGNOSIS — N186 End stage renal disease: Secondary | ICD-10-CM | POA: Diagnosis not present

## 2018-07-13 DIAGNOSIS — N2581 Secondary hyperparathyroidism of renal origin: Secondary | ICD-10-CM | POA: Diagnosis not present

## 2018-07-13 DIAGNOSIS — D631 Anemia in chronic kidney disease: Secondary | ICD-10-CM | POA: Diagnosis not present

## 2018-07-13 DIAGNOSIS — D509 Iron deficiency anemia, unspecified: Secondary | ICD-10-CM | POA: Diagnosis not present

## 2018-07-15 DIAGNOSIS — D631 Anemia in chronic kidney disease: Secondary | ICD-10-CM | POA: Diagnosis not present

## 2018-07-15 DIAGNOSIS — E119 Type 2 diabetes mellitus without complications: Secondary | ICD-10-CM | POA: Diagnosis not present

## 2018-07-15 DIAGNOSIS — D509 Iron deficiency anemia, unspecified: Secondary | ICD-10-CM | POA: Diagnosis not present

## 2018-07-15 DIAGNOSIS — N186 End stage renal disease: Secondary | ICD-10-CM | POA: Diagnosis not present

## 2018-07-15 DIAGNOSIS — N2581 Secondary hyperparathyroidism of renal origin: Secondary | ICD-10-CM | POA: Diagnosis not present

## 2018-07-17 DIAGNOSIS — N2581 Secondary hyperparathyroidism of renal origin: Secondary | ICD-10-CM | POA: Diagnosis not present

## 2018-07-17 DIAGNOSIS — E119 Type 2 diabetes mellitus without complications: Secondary | ICD-10-CM | POA: Diagnosis not present

## 2018-07-17 DIAGNOSIS — N186 End stage renal disease: Secondary | ICD-10-CM | POA: Diagnosis not present

## 2018-07-17 DIAGNOSIS — D509 Iron deficiency anemia, unspecified: Secondary | ICD-10-CM | POA: Diagnosis not present

## 2018-07-17 DIAGNOSIS — D631 Anemia in chronic kidney disease: Secondary | ICD-10-CM | POA: Diagnosis not present

## 2018-07-20 DIAGNOSIS — E119 Type 2 diabetes mellitus without complications: Secondary | ICD-10-CM | POA: Diagnosis not present

## 2018-07-20 DIAGNOSIS — D631 Anemia in chronic kidney disease: Secondary | ICD-10-CM | POA: Diagnosis not present

## 2018-07-20 DIAGNOSIS — N186 End stage renal disease: Secondary | ICD-10-CM | POA: Diagnosis not present

## 2018-07-20 DIAGNOSIS — N2581 Secondary hyperparathyroidism of renal origin: Secondary | ICD-10-CM | POA: Diagnosis not present

## 2018-07-20 DIAGNOSIS — D509 Iron deficiency anemia, unspecified: Secondary | ICD-10-CM | POA: Diagnosis not present

## 2018-07-22 DIAGNOSIS — N186 End stage renal disease: Secondary | ICD-10-CM | POA: Diagnosis not present

## 2018-07-22 DIAGNOSIS — D509 Iron deficiency anemia, unspecified: Secondary | ICD-10-CM | POA: Diagnosis not present

## 2018-07-22 DIAGNOSIS — D631 Anemia in chronic kidney disease: Secondary | ICD-10-CM | POA: Diagnosis not present

## 2018-07-22 DIAGNOSIS — E119 Type 2 diabetes mellitus without complications: Secondary | ICD-10-CM | POA: Diagnosis not present

## 2018-07-22 DIAGNOSIS — N2581 Secondary hyperparathyroidism of renal origin: Secondary | ICD-10-CM | POA: Diagnosis not present

## 2018-07-24 DIAGNOSIS — D631 Anemia in chronic kidney disease: Secondary | ICD-10-CM | POA: Diagnosis not present

## 2018-07-24 DIAGNOSIS — E119 Type 2 diabetes mellitus without complications: Secondary | ICD-10-CM | POA: Diagnosis not present

## 2018-07-24 DIAGNOSIS — N2581 Secondary hyperparathyroidism of renal origin: Secondary | ICD-10-CM | POA: Diagnosis not present

## 2018-07-24 DIAGNOSIS — D509 Iron deficiency anemia, unspecified: Secondary | ICD-10-CM | POA: Diagnosis not present

## 2018-07-24 DIAGNOSIS — N186 End stage renal disease: Secondary | ICD-10-CM | POA: Diagnosis not present

## 2018-07-25 DIAGNOSIS — N186 End stage renal disease: Secondary | ICD-10-CM | POA: Diagnosis not present

## 2018-07-25 DIAGNOSIS — I120 Hypertensive chronic kidney disease with stage 5 chronic kidney disease or end stage renal disease: Secondary | ICD-10-CM | POA: Diagnosis not present

## 2018-07-25 DIAGNOSIS — Z992 Dependence on renal dialysis: Secondary | ICD-10-CM | POA: Diagnosis not present

## 2018-07-27 DIAGNOSIS — N2581 Secondary hyperparathyroidism of renal origin: Secondary | ICD-10-CM | POA: Diagnosis not present

## 2018-07-27 DIAGNOSIS — E119 Type 2 diabetes mellitus without complications: Secondary | ICD-10-CM | POA: Diagnosis not present

## 2018-07-27 DIAGNOSIS — D509 Iron deficiency anemia, unspecified: Secondary | ICD-10-CM | POA: Diagnosis not present

## 2018-07-27 DIAGNOSIS — D631 Anemia in chronic kidney disease: Secondary | ICD-10-CM | POA: Diagnosis not present

## 2018-07-27 DIAGNOSIS — N186 End stage renal disease: Secondary | ICD-10-CM | POA: Diagnosis not present

## 2018-07-31 DIAGNOSIS — D509 Iron deficiency anemia, unspecified: Secondary | ICD-10-CM | POA: Diagnosis not present

## 2018-07-31 DIAGNOSIS — N2581 Secondary hyperparathyroidism of renal origin: Secondary | ICD-10-CM | POA: Diagnosis not present

## 2018-07-31 DIAGNOSIS — E119 Type 2 diabetes mellitus without complications: Secondary | ICD-10-CM | POA: Diagnosis not present

## 2018-07-31 DIAGNOSIS — D631 Anemia in chronic kidney disease: Secondary | ICD-10-CM | POA: Diagnosis not present

## 2018-07-31 DIAGNOSIS — N186 End stage renal disease: Secondary | ICD-10-CM | POA: Diagnosis not present

## 2018-08-03 DIAGNOSIS — N2581 Secondary hyperparathyroidism of renal origin: Secondary | ICD-10-CM | POA: Diagnosis not present

## 2018-08-03 DIAGNOSIS — N186 End stage renal disease: Secondary | ICD-10-CM | POA: Diagnosis not present

## 2018-08-03 DIAGNOSIS — D509 Iron deficiency anemia, unspecified: Secondary | ICD-10-CM | POA: Diagnosis not present

## 2018-08-03 DIAGNOSIS — D631 Anemia in chronic kidney disease: Secondary | ICD-10-CM | POA: Diagnosis not present

## 2018-08-03 DIAGNOSIS — E119 Type 2 diabetes mellitus without complications: Secondary | ICD-10-CM | POA: Diagnosis not present

## 2018-08-05 DIAGNOSIS — N2581 Secondary hyperparathyroidism of renal origin: Secondary | ICD-10-CM | POA: Diagnosis not present

## 2018-08-05 DIAGNOSIS — E119 Type 2 diabetes mellitus without complications: Secondary | ICD-10-CM | POA: Diagnosis not present

## 2018-08-05 DIAGNOSIS — N186 End stage renal disease: Secondary | ICD-10-CM | POA: Diagnosis not present

## 2018-08-05 DIAGNOSIS — D509 Iron deficiency anemia, unspecified: Secondary | ICD-10-CM | POA: Diagnosis not present

## 2018-08-05 DIAGNOSIS — D631 Anemia in chronic kidney disease: Secondary | ICD-10-CM | POA: Diagnosis not present

## 2018-08-07 DIAGNOSIS — N186 End stage renal disease: Secondary | ICD-10-CM | POA: Diagnosis not present

## 2018-08-07 DIAGNOSIS — D631 Anemia in chronic kidney disease: Secondary | ICD-10-CM | POA: Diagnosis not present

## 2018-08-07 DIAGNOSIS — E119 Type 2 diabetes mellitus without complications: Secondary | ICD-10-CM | POA: Diagnosis not present

## 2018-08-07 DIAGNOSIS — D509 Iron deficiency anemia, unspecified: Secondary | ICD-10-CM | POA: Diagnosis not present

## 2018-08-07 DIAGNOSIS — N2581 Secondary hyperparathyroidism of renal origin: Secondary | ICD-10-CM | POA: Diagnosis not present

## 2018-08-10 DIAGNOSIS — D631 Anemia in chronic kidney disease: Secondary | ICD-10-CM | POA: Diagnosis not present

## 2018-08-10 DIAGNOSIS — N2581 Secondary hyperparathyroidism of renal origin: Secondary | ICD-10-CM | POA: Diagnosis not present

## 2018-08-10 DIAGNOSIS — N186 End stage renal disease: Secondary | ICD-10-CM | POA: Diagnosis not present

## 2018-08-10 DIAGNOSIS — E119 Type 2 diabetes mellitus without complications: Secondary | ICD-10-CM | POA: Diagnosis not present

## 2018-08-10 DIAGNOSIS — D509 Iron deficiency anemia, unspecified: Secondary | ICD-10-CM | POA: Diagnosis not present

## 2018-08-12 DIAGNOSIS — N186 End stage renal disease: Secondary | ICD-10-CM | POA: Diagnosis not present

## 2018-08-12 DIAGNOSIS — E119 Type 2 diabetes mellitus without complications: Secondary | ICD-10-CM | POA: Diagnosis not present

## 2018-08-12 DIAGNOSIS — N2581 Secondary hyperparathyroidism of renal origin: Secondary | ICD-10-CM | POA: Diagnosis not present

## 2018-08-12 DIAGNOSIS — D631 Anemia in chronic kidney disease: Secondary | ICD-10-CM | POA: Diagnosis not present

## 2018-08-12 DIAGNOSIS — D509 Iron deficiency anemia, unspecified: Secondary | ICD-10-CM | POA: Diagnosis not present

## 2018-08-14 DIAGNOSIS — N186 End stage renal disease: Secondary | ICD-10-CM | POA: Diagnosis not present

## 2018-08-14 DIAGNOSIS — D509 Iron deficiency anemia, unspecified: Secondary | ICD-10-CM | POA: Diagnosis not present

## 2018-08-14 DIAGNOSIS — N2581 Secondary hyperparathyroidism of renal origin: Secondary | ICD-10-CM | POA: Diagnosis not present

## 2018-08-14 DIAGNOSIS — D631 Anemia in chronic kidney disease: Secondary | ICD-10-CM | POA: Diagnosis not present

## 2018-08-14 DIAGNOSIS — E119 Type 2 diabetes mellitus without complications: Secondary | ICD-10-CM | POA: Diagnosis not present

## 2018-08-19 DIAGNOSIS — D631 Anemia in chronic kidney disease: Secondary | ICD-10-CM | POA: Diagnosis not present

## 2018-08-19 DIAGNOSIS — D509 Iron deficiency anemia, unspecified: Secondary | ICD-10-CM | POA: Diagnosis not present

## 2018-08-19 DIAGNOSIS — N2581 Secondary hyperparathyroidism of renal origin: Secondary | ICD-10-CM | POA: Diagnosis not present

## 2018-08-19 DIAGNOSIS — E119 Type 2 diabetes mellitus without complications: Secondary | ICD-10-CM | POA: Diagnosis not present

## 2018-08-19 DIAGNOSIS — N186 End stage renal disease: Secondary | ICD-10-CM | POA: Diagnosis not present

## 2018-08-21 DIAGNOSIS — D631 Anemia in chronic kidney disease: Secondary | ICD-10-CM | POA: Diagnosis not present

## 2018-08-21 DIAGNOSIS — E119 Type 2 diabetes mellitus without complications: Secondary | ICD-10-CM | POA: Diagnosis not present

## 2018-08-21 DIAGNOSIS — D509 Iron deficiency anemia, unspecified: Secondary | ICD-10-CM | POA: Diagnosis not present

## 2018-08-21 DIAGNOSIS — N2581 Secondary hyperparathyroidism of renal origin: Secondary | ICD-10-CM | POA: Diagnosis not present

## 2018-08-21 DIAGNOSIS — N186 End stage renal disease: Secondary | ICD-10-CM | POA: Diagnosis not present

## 2018-08-24 DIAGNOSIS — N2581 Secondary hyperparathyroidism of renal origin: Secondary | ICD-10-CM | POA: Diagnosis not present

## 2018-08-24 DIAGNOSIS — E119 Type 2 diabetes mellitus without complications: Secondary | ICD-10-CM | POA: Diagnosis not present

## 2018-08-24 DIAGNOSIS — N186 End stage renal disease: Secondary | ICD-10-CM | POA: Diagnosis not present

## 2018-08-24 DIAGNOSIS — D631 Anemia in chronic kidney disease: Secondary | ICD-10-CM | POA: Diagnosis not present

## 2018-08-24 DIAGNOSIS — D509 Iron deficiency anemia, unspecified: Secondary | ICD-10-CM | POA: Diagnosis not present

## 2018-08-25 DIAGNOSIS — Z992 Dependence on renal dialysis: Secondary | ICD-10-CM | POA: Diagnosis not present

## 2018-08-25 DIAGNOSIS — I120 Hypertensive chronic kidney disease with stage 5 chronic kidney disease or end stage renal disease: Secondary | ICD-10-CM | POA: Diagnosis not present

## 2018-08-25 DIAGNOSIS — N186 End stage renal disease: Secondary | ICD-10-CM | POA: Diagnosis not present

## 2018-08-26 DIAGNOSIS — D509 Iron deficiency anemia, unspecified: Secondary | ICD-10-CM | POA: Diagnosis not present

## 2018-08-26 DIAGNOSIS — E119 Type 2 diabetes mellitus without complications: Secondary | ICD-10-CM | POA: Diagnosis not present

## 2018-08-26 DIAGNOSIS — D631 Anemia in chronic kidney disease: Secondary | ICD-10-CM | POA: Diagnosis not present

## 2018-08-26 DIAGNOSIS — N186 End stage renal disease: Secondary | ICD-10-CM | POA: Diagnosis not present

## 2018-08-26 DIAGNOSIS — N2581 Secondary hyperparathyroidism of renal origin: Secondary | ICD-10-CM | POA: Diagnosis not present

## 2018-08-28 DIAGNOSIS — E119 Type 2 diabetes mellitus without complications: Secondary | ICD-10-CM | POA: Diagnosis not present

## 2018-08-28 DIAGNOSIS — N186 End stage renal disease: Secondary | ICD-10-CM | POA: Diagnosis not present

## 2018-08-28 DIAGNOSIS — D509 Iron deficiency anemia, unspecified: Secondary | ICD-10-CM | POA: Diagnosis not present

## 2018-08-28 DIAGNOSIS — D631 Anemia in chronic kidney disease: Secondary | ICD-10-CM | POA: Diagnosis not present

## 2018-08-28 DIAGNOSIS — N2581 Secondary hyperparathyroidism of renal origin: Secondary | ICD-10-CM | POA: Diagnosis not present

## 2018-08-31 DIAGNOSIS — N186 End stage renal disease: Secondary | ICD-10-CM | POA: Diagnosis not present

## 2018-08-31 DIAGNOSIS — D631 Anemia in chronic kidney disease: Secondary | ICD-10-CM | POA: Diagnosis not present

## 2018-08-31 DIAGNOSIS — D509 Iron deficiency anemia, unspecified: Secondary | ICD-10-CM | POA: Diagnosis not present

## 2018-08-31 DIAGNOSIS — E119 Type 2 diabetes mellitus without complications: Secondary | ICD-10-CM | POA: Diagnosis not present

## 2018-08-31 DIAGNOSIS — N2581 Secondary hyperparathyroidism of renal origin: Secondary | ICD-10-CM | POA: Diagnosis not present

## 2018-09-04 ENCOUNTER — Emergency Department (HOSPITAL_COMMUNITY)
Admission: EM | Admit: 2018-09-04 | Discharge: 2018-09-04 | Disposition: A | Discharge status: Home / Self Care | Payer: Medicare Other | Attending: Emergency Medicine | Admitting: Emergency Medicine

## 2018-09-04 ENCOUNTER — Emergency Department (HOSPITAL_COMMUNITY): Payer: Medicare Other

## 2018-09-04 ENCOUNTER — Other Ambulatory Visit: Payer: Self-pay

## 2018-09-04 DIAGNOSIS — I10 Essential (primary) hypertension: Secondary | ICD-10-CM | POA: Diagnosis not present

## 2018-09-04 DIAGNOSIS — Z7984 Long term (current) use of oral hypoglycemic drugs: Secondary | ICD-10-CM | POA: Diagnosis not present

## 2018-09-04 DIAGNOSIS — Z7982 Long term (current) use of aspirin: Secondary | ICD-10-CM | POA: Insufficient documentation

## 2018-09-04 DIAGNOSIS — Z992 Dependence on renal dialysis: Secondary | ICD-10-CM | POA: Insufficient documentation

## 2018-09-04 DIAGNOSIS — R1084 Generalized abdominal pain: Secondary | ICD-10-CM | POA: Diagnosis not present

## 2018-09-04 DIAGNOSIS — R531 Weakness: Secondary | ICD-10-CM | POA: Diagnosis not present

## 2018-09-04 DIAGNOSIS — I5042 Chronic combined systolic (congestive) and diastolic (congestive) heart failure: Secondary | ICD-10-CM | POA: Diagnosis not present

## 2018-09-04 DIAGNOSIS — R4182 Altered mental status, unspecified: Secondary | ICD-10-CM | POA: Diagnosis not present

## 2018-09-04 DIAGNOSIS — R42 Dizziness and giddiness: Secondary | ICD-10-CM | POA: Insufficient documentation

## 2018-09-04 DIAGNOSIS — R5383 Other fatigue: Secondary | ICD-10-CM | POA: Diagnosis not present

## 2018-09-04 DIAGNOSIS — I132 Hypertensive heart and chronic kidney disease with heart failure and with stage 5 chronic kidney disease, or end stage renal disease: Secondary | ICD-10-CM | POA: Diagnosis not present

## 2018-09-04 DIAGNOSIS — E161 Other hypoglycemia: Secondary | ICD-10-CM | POA: Diagnosis not present

## 2018-09-04 DIAGNOSIS — E1129 Type 2 diabetes mellitus with other diabetic kidney complication: Secondary | ICD-10-CM | POA: Diagnosis not present

## 2018-09-04 DIAGNOSIS — Z87891 Personal history of nicotine dependence: Secondary | ICD-10-CM | POA: Diagnosis not present

## 2018-09-04 DIAGNOSIS — N186 End stage renal disease: Secondary | ICD-10-CM | POA: Diagnosis not present

## 2018-09-04 DIAGNOSIS — N2581 Secondary hyperparathyroidism of renal origin: Secondary | ICD-10-CM | POA: Diagnosis not present

## 2018-09-04 DIAGNOSIS — D631 Anemia in chronic kidney disease: Secondary | ICD-10-CM | POA: Diagnosis not present

## 2018-09-04 DIAGNOSIS — R109 Unspecified abdominal pain: Secondary | ICD-10-CM | POA: Diagnosis not present

## 2018-09-04 DIAGNOSIS — E162 Hypoglycemia, unspecified: Secondary | ICD-10-CM | POA: Diagnosis not present

## 2018-09-04 DIAGNOSIS — R110 Nausea: Secondary | ICD-10-CM | POA: Insufficient documentation

## 2018-09-04 DIAGNOSIS — E119 Type 2 diabetes mellitus without complications: Secondary | ICD-10-CM | POA: Diagnosis not present

## 2018-09-04 DIAGNOSIS — R197 Diarrhea, unspecified: Secondary | ICD-10-CM | POA: Insufficient documentation

## 2018-09-04 DIAGNOSIS — Z79899 Other long term (current) drug therapy: Secondary | ICD-10-CM | POA: Diagnosis not present

## 2018-09-04 DIAGNOSIS — D509 Iron deficiency anemia, unspecified: Secondary | ICD-10-CM | POA: Diagnosis not present

## 2018-09-04 DIAGNOSIS — E1122 Type 2 diabetes mellitus with diabetic chronic kidney disease: Secondary | ICD-10-CM | POA: Diagnosis not present

## 2018-09-04 LAB — POCT I-STAT EG7
Acid-Base Excess: 8 mmol/L — ABNORMAL HIGH (ref 0.0–2.0)
Bicarbonate: 35.7 mmol/L — ABNORMAL HIGH (ref 20.0–28.0)
Calcium, Ion: 0.92 mmol/L — ABNORMAL LOW (ref 1.15–1.40)
HCT: 39 % (ref 39.0–52.0)
Hemoglobin: 13.3 g/dL (ref 13.0–17.0)
O2 Saturation: 98 %
Patient temperature: 37
Potassium: 4.9 mmol/L (ref 3.5–5.1)
Sodium: 136 mmol/L (ref 135–145)
TCO2: 38 mmol/L — ABNORMAL HIGH (ref 22–32)
pCO2, Ven: 62.4 mmHg — ABNORMAL HIGH (ref 44.0–60.0)
pH, Ven: 7.366 (ref 7.250–7.430)
pO2, Ven: 113 mmHg — ABNORMAL HIGH (ref 32.0–45.0)

## 2018-09-04 LAB — COMPREHENSIVE METABOLIC PANEL
ALT: 20 U/L (ref 0–44)
AST: 37 U/L (ref 15–41)
Albumin: 3.6 g/dL (ref 3.5–5.0)
Alkaline Phosphatase: 101 U/L (ref 38–126)
Anion gap: 18 — ABNORMAL HIGH (ref 5–15)
BUN: 38 mg/dL — ABNORMAL HIGH (ref 6–20)
CO2: 28 mmol/L (ref 22–32)
Calcium: 8.3 mg/dL — ABNORMAL LOW (ref 8.9–10.3)
Chloride: 91 mmol/L — ABNORMAL LOW (ref 98–111)
Creatinine, Ser: 11.83 mg/dL — ABNORMAL HIGH (ref 0.61–1.24)
GFR calc Af Amer: 6 mL/min — ABNORMAL LOW (ref >60)
GFR calc non Af Amer: 5 mL/min — ABNORMAL LOW (ref >60)
Glucose, Bld: 88 mg/dL (ref 70–99)
Potassium: 4.6 mmol/L (ref 3.5–5.1)
Sodium: 137 mmol/L (ref 135–145)
Total Bilirubin: 1.1 mg/dL (ref 0.3–1.2)
Total Protein: 7.8 g/dL (ref 6.5–8.1)

## 2018-09-04 LAB — CBC WITH DIFFERENTIAL/PLATELET
Abs Immature Granulocytes: 0.15 10*3/uL — ABNORMAL HIGH (ref 0.00–0.07)
Basophils Absolute: 0.1 10*3/uL (ref 0.0–0.1)
Basophils Relative: 1 %
Eosinophils Absolute: 0.3 10*3/uL (ref 0.0–0.5)
Eosinophils Relative: 2 %
HCT: 39.8 % (ref 39.0–52.0)
Hemoglobin: 12.8 g/dL — ABNORMAL LOW (ref 13.0–17.0)
Immature Granulocytes: 1 %
Lymphocytes Relative: 10 %
Lymphs Abs: 1.3 10*3/uL (ref 0.7–4.0)
MCH: 25.9 pg — ABNORMAL LOW (ref 26.0–34.0)
MCHC: 32.2 g/dL (ref 30.0–36.0)
MCV: 80.6 fL (ref 80.0–100.0)
Monocytes Absolute: 0.9 10*3/uL (ref 0.1–1.0)
Monocytes Relative: 7 %
Neutro Abs: 10.8 10*3/uL — ABNORMAL HIGH (ref 1.7–7.7)
Neutrophils Relative %: 79 %
Platelets: 232 10*3/uL (ref 150–400)
RBC: 4.94 MIL/uL (ref 4.22–5.81)
RDW: 15.9 % — ABNORMAL HIGH (ref 11.5–15.5)
WBC: 13.4 10*3/uL — ABNORMAL HIGH (ref 4.0–10.5)
nRBC: 0 % (ref 0.0–0.2)

## 2018-09-04 LAB — LIPASE, BLOOD: Lipase: 36 U/L (ref 11–51)

## 2018-09-04 NOTE — ED Triage Notes (Addendum)
Pt brought in by ems from HD for c/o lethargic ; pt only received 2.5 hrs of treatment today  ; pt last missed HD on Thursday ; pt c/o abd cramping with diarrhea and nausea that began this morning ; pt alert and oriented x 4 and following simple commands ;

## 2018-09-04 NOTE — ED Provider Notes (Signed)
[facility] EMERGENCY DEPARTMENT Provider Note   CSN: [id number] Arrival date & time: 09/04/18  1642    History   Chief Complaint Chief Complaint  Patient presents with  . Fatigue    HPI Joshua Bail. is a 38 y.o. male.     HPI   Patient is a 38 year old male with a history of anemia, systolic and diastolic heart failure, ESRD on dialysis (T/Th/Sat, [facility] diabetes, who presents emergency department today for evaluation of fatigue, generalized weakness, and "lethargy" noted while pt was ad HD PTA. Pt sleepy when I enter the room but arouses easily. He states he has felt fatigued and ill starting this morning. He c/o diarrhea and abd pain starting this morning. States that abd pain is right sided. Pain is intermittent, lasts for seconds at a time. Described at aching pain. Rates pain 7/10. States he ate peppermint which improved sxs. Sxs also improved with BMs. Also reports nausea, but denies vomiting, bloody stools, or dysuria (still urinates 1x/day). Denies cp, sob, or cough. States he missed dialysis 2 days ago because he slept through his session. He only completed 2.5/4.5 hours of dialysis today.   Denies known sick contacts. C.o dizziness that has been intermittent x1 month. No specific provoking factos. He has been evaluated by pcp for this and is taking promethazine for it. Sxs unchanged today. No HA, vision changes, or unilateral numbness/weakness. He does note promethazine makes him very sleepy and he was not sleeping prior to taking this medication today.   Past Medical History:  Diagnosis Date  . Anemia of chronic disease   . Anxiety   . Arthritis   . Bronchitis    as a child  . Chronic combined systolic and diastolic heart failure ([place])    a.  Echo (12/2012):  Somewhat bright myocardium, moderate to severe LVH, EF 30%, diffuse HK, diastolic dysfunction, mild AI, moderate to severe LAE, moderate to severe RV, moderate to severe reduced RVSF,  mild RAE, PASP 38 mm Hg.  . ESRD (end stage renal disease) ([place])    hemodialysis Tues, Thurs, Sat- [address]  . GERD (gastroesophageal reflux disease)   . Headache(784.0)    -no headache since starting dialysis   . Hx of cardiac catheterization    a. Nuclear (04/2013):  Mild inferolateral ischemia, EF 46%;  b. R/L HC (04/2013):  No CAD, RA 5, RV 44/3/8, PA 43/16 (mean 28), PCWP 15  . Hypertension   . Hypertensive cardiovascular disease    Renal Artery [place] (09/2012): No evidence of renal artery stenosis  . NICM (nonischemic cardiomyopathy) ([place])   . Sleep apnea    does not wear CPAP  . Type II diabetes mellitus [facility])     Patient Active Problem List   Diagnosis Date Noted  . Hypertensive heart and chronic kidney disease with heart failure and with stage 5 chronic kidney disease, or end stage renal disease ([place]) 07/03/2017  . Preop cardiovascular exam 07/03/2017  . Chronic combined systolic and diastolic heart failure ([place]) 05/31/2013  . NICM (nonischemic cardiomyopathy) ([place]) 05/31/2013  . Malignant hypertension with heart failure and end-stage renal dis ([place]) 05/31/2013  . Hypertensive cardiovascular disease 05/13/2013  . Hypertensive emergency 05/09/2013  . Noncompliance 02/02/2013  . Cholelithiases 02/02/2013  . Urinary retention 01/25/2013  . Elevated TSH 01/25/2013  . Transaminitis 01/24/2013  . Acute on chronic combined systolic and diastolic CHF (congestive heart failure) ([place]) 01/24/2013  . Dehydration 09/29/2012  . ESRD (end stage renal  disease) ([place]) 09/29/2012  . Anemia in chronic kidney disease 09/29/2012  . ? Diabetes 09/02/2012    Past Surgical History:  Procedure Laterality Date  . AV FISTULA PLACEMENT Left 04/18/2013   Procedure: ARTERIOVENOUS (AV) FISTULA CREATION;  Surgeon: Elam Dutch, MD;  Location: [facility];  Service: Vascular;  Laterality: Left;  . FISTULA SUPERFICIALIZATION Left 08/03/2017   Procedure: FISTULA PLICATION LEFT UPPER ARM ANEURYSM;  Surgeon:  Rosetta Posner, MD;  Location: [facility];  Service: Vascular;  Laterality: Left;  . INSERTION OF DIALYSIS CATHETER N/A 05/10/2013   Procedure: INSERTION OF DIALYSIS CATHETER;  Surgeon: Elam Dutch, MD;  Location: [facility];  Service: Vascular;  Laterality: N/A;  . LEFT AND RIGHT HEART CATHETERIZATION WITH CORONARY ANGIOGRAM N/A 05/16/2013   Procedure: LEFT AND RIGHT HEART CATHETERIZATION WITH CORONARY ANGIOGRAM;  Surgeon: Burnell Blanks, MD;  Location: [facility] CATH LAB;  Service: Cardiovascular;  Laterality: N/A;  . REVISON OF ARTERIOVENOUS FISTULA Left 05/24/2018   Procedure: REVISION PLICATION OF ARTERIOVENOUS FISTULA LEFT UPPER ARM;  Surgeon: Angelia Mould, MD;  Location: [facility];  Service: Vascular;  Laterality: Left;        Home Medications    Prior to Admission medications   Medication Sig Start Date End Date Taking? Authorizing Provider  aspirin 81 MG tablet Take 81 mg by mouth daily.     [provider]  calcium acetate (PHOSLO) 667 MG capsule Take 1,334 mg by mouth 3 (three) times daily with meals. Take 1334 mg with each additional snack    [provider]  carvedilol (COREG) 12.5 MG tablet Take 25 mg by mouth 2 (two) times daily with a meal.     Cristal Ford, DO  cinacalcet (SENSIPAR) 60 MG tablet Take 60 mg by mouth daily.    [provider]  glimepiride (AMARYL) 4 MG tablet Take 8 mg by mouth 2 (two) times daily.  05/26/17   [provider]  JANUVIA 100 MG tablet Take 100 mg by mouth daily.  05/29/17   [provider]  multivitamin (RENA-VIT) TABS tablet Take 1 tablet by mouth daily.    [provider]  oxyCODONE-acetaminophen (PERCOCET) 5-325 MG tablet Take 1 tablet by mouth every 6 (six) hours as needed for severe pain. 05/24/18   Rhyne, Hulen Shouts, PA-C  promethazine (PHENERGAN) 12.5 MG tablet Take 12.5 mg by mouth every 6 (six) hours as needed for nausea or vomiting.    [provider]    Family History  Family History  Problem Relation Age of Onset  . Stomach cancer Mother   . Diabetes Father   . Hypertension Father   . Hypertension Maternal Grandmother   . Hypertension Maternal Grandfather   . Kidney disease Maternal Grandfather   . Diabetes Maternal Grandfather     Social History Social History   Tobacco Use  . Smoking status: Former Smoker    Years: 0.10    Types: Cigarettes    Last attempt to quit: 09/24/1999    Years since quitting: 18.9  . Smokeless tobacco: Never Used  Substance Use Topics  . Alcohol use: No    Comment: socially uses  . Drug use: No     Allergies   Isordil [isosorbide nitrate]; Isosorbide dinitrate; and Nitrates, organic   Review of Systems Review of Systems   Physical Exam Updated Vital Signs BP (!) 182/112   Pulse 81   Temp (!) 97.5 F (36.4 C) (Oral)   Resp 17   Ht  5\' 11"  (1.803 m)   Wt 126 kg   SpO2 100%   BMI 38.74 kg/m   Physical Exam Vitals signs and nursing note reviewed.  Constitutional:      General: He is not in acute distress.    Appearance: He is well-developed. He is not ill-appearing or toxic-appearing.  HENT:     Head: Normocephalic and atraumatic.     Nose: Nose normal.     Mouth/Throat:     Mouth: Mucous membranes are moist.  Eyes:     Extraocular Movements: Extraocular movements intact.     Conjunctiva/sclera: Conjunctivae normal.     Pupils: Pupils are equal, round, and reactive to light.     Comments: Pinpoint pupils.  Neck:     Musculoskeletal: Neck supple.  Cardiovascular:     Rate and Rhythm: Normal rate and regular rhythm.     Pulses: Normal pulses.     Heart sounds: Normal heart sounds. No murmur.  Pulmonary:     Effort: Pulmonary effort is normal. No respiratory distress.     Breath sounds: Normal breath sounds. No stridor. No wheezing, rhonchi or rales.  Abdominal:     General: Bowel sounds are normal. There is no distension.     Palpations: Abdomen is soft.     Tenderness: There is no  abdominal tenderness. There is no right CVA tenderness, left CVA tenderness, guarding or rebound.  Skin:    General: Skin is warm and dry.  Neurological:     Mental Status: He is alert.     Comments: Mental Status:  Sleepy but easily arousable, thought content appropriate, able to give a coherent history. Speech fluent without evidence of aphasia. Able to follow 2 step commands without difficulty.  Cranial Nerves:  II:  pupils equal, round, reactive to light III,IV, VI: ptosis not present, extra-ocular motions intact bilaterally  V,VII: smile symmetric, facial light touch sensation equal VIII: hearing grossly normal to voice  X: uvula elevates symmetrically  XI: bilateral shoulder shrug symmetric and strong XII: midline tongue extension without fassiculations Motor:  Normal tone. 5/5 strength of BUE and BLE major muscle groups including strong and equal grip strength and dorsiflexion/plantar flexion Sensory: light touch normal in all extremities. Cerebellar: normal finger-to-nose with bilateral upper extremities Gait: normal gait and balance.   Psychiatric:        Mood and Affect: Mood normal.      ED Treatments / Results  Labs (all labs ordered are listed, but only abnormal results are displayed) Labs Reviewed  CBC WITH DIFFERENTIAL/PLATELET - Abnormal; Notable for the following components:      Result Value   WBC 13.4 (*)    Hemoglobin 12.8 (*)    MCH 25.9 (*)    RDW 15.9 (*)    Neutro Abs 10.8 (*)    Abs Immature Granulocytes 0.15 (*)    All other components within normal limits  COMPREHENSIVE METABOLIC PANEL - Abnormal; Notable for the following components:   Chloride 91 (*)    BUN 38 (*)    Creatinine, Ser 11.83 (*)    Calcium 8.3 (*)    GFR calc non Af Amer 5 (*)    GFR calc Af Amer 6 (*)    Anion gap 18 (*)    All other components within normal limits  POCT I-STAT EG7 - Abnormal; Notable for the following components:   pCO2, Ven 62.4 (*)    pO2, Ven 113.0 (*)     Bicarbonate 35.7 (*)  TCO2 38 (*)    Acid-Base Excess 8.0 (*)    Calcium, Ion 0.92 (*)    All other components within normal limits  LIPASE, BLOOD    EKG EKG Interpretation  Date/Time:  Saturday September 04 2018 17:07:20 EDT Ventricular Rate:  83 PR Interval:    QRS Duration: 124 QT Interval:  479 QTC Calculation: 563 R Axis:   -29 Text Interpretation:  Sinus rhythm IVCD, consider atypical RBBB rate slower than previous Confirmed by Theotis Burrow [id number]) on 09/04/2018 6:47:51 PM   Radiology Ct Head Wo Contrast  Result Date: 09/04/2018 CLINICAL DATA:  Altered mental status. Lethargy. EXAM: CT HEAD WITHOUT CONTRAST TECHNIQUE: Contiguous axial images were obtained from the base of the skull through the vertex without intravenous contrast. COMPARISON:  Head CT 04/11/2013 FINDINGS: Brain: No intracranial hemorrhage, mass effect, or midline shift. No hydrocephalus. The basilar cisterns are patent. No evidence of territorial infarct or acute ischemia. No extra-axial or intracranial fluid collection. Vascular: No hyperdense vessel. Skull: No fracture or focal lesion. Sinuses/Orbits: Mucous retention cyst in left maxillary sinus. No acute findings. Other: None. IMPRESSION: No acute intracranial abnormality. Electronically Signed   By: Keith Rake M.D.   On: 09/04/2018 20:02   Dg Chest Portable 1 View  Result Date: 09/04/2018 CLINICAL DATA:  Altered mental status. On dialysis. EXAM: PORTABLE CHEST 1 VIEW COMPARISON:  05/10/2013 FINDINGS: Right jugular dialysis catheter has been removed. The cardiac silhouette is mildly enlarged, less prominent than on the prior study. No airspace consolidation, edema, sizable pleural effusion, pneumothorax is identified. No acute osseous abnormality is seen. IMPRESSION: Cardiomegaly without evidence of acute cardiopulmonary disease. Electronically Signed   By: Logan Bores M.D.   On: 09/04/2018 19:22    Procedures Procedures (including critical care  time)  Medications Ordered in ED Medications - No data to display   Initial Impression / Assessment and Plan / ED Course  I have reviewed the triage vital signs and the nursing notes.  Pertinent labs & imaging results that were available during my care of the patient were reviewed by me and considered in my medical decision making (see chart for details).     Final Clinical Impressions(s) / ED Diagnoses   Final diagnoses:  Generalized weakness  Diarrhea, unspecified type   Patient presenting for evaluation of generalized fatigue, diarrhea and abdominal pain.  Did not complete dialysis fully prior to arrival.  He took promethazine prior to arrival.  On Initial evaluation patient somnolent but easily arousable and answering questions appropriately.  Neuro exam is nonfocal.  Remainder of his exam is benign including his abdominal exam.  Will obtain labs, chest x-ray, CT head and EKG and reassess.  CBC with mild leukocytosis, may be secondary to viral infection given his diarrhea.  Hemoglobin slightly low at 12.8 however this is improved from baseline. CMP with normal bicarb.  Elevated BUN and creatinine consistent with recent bleeding missed dialysis and partial dialysis today.  Liver function is normal. Lipase is negative UA and UDS attempted to be obtained however patient was unable to produce urine sample throughout evaluation. VBG obtained and pH is normal.  PCO2 slightly elevated but not significantly elevated to suggest altered mental status from hypercarbia  EKG with  Sinus rhythm IVCD, consider atypical RBBB rate slower than previous, QTC is prolonged.  CT head negative, and I doubt neurologic cause of pts somnolence.  On reevaluation patient is more awake.  He was able to ambulate around the room with steady gait.  He states his abdominal pain is completely resolved and he is no longer nauseated.  He requested some crackers which he ate and was able to tolerate.    I believe  that he may have a viral infection leading to his fatigue and diarrhea today.  I feel that his somnolence is secondary to his use of Phenergan especially given his improvement over period of several hours in the ED.  I advised him to stop taking his Phenergan in light of his prolonged QTC.  Advised to follow-up with PCP next week for reassessment of his symptoms.  Advised hydration in light of diarrhea.  Advised return to the ED for new or worsening symptoms including recurrent abdominal pain.  He voiced understanding the plan and reasons to return to the ED.  All questions answered.  Patient stable for discharge.  Pt presentation, workup and results discussed with Dr. Rex Kras who is in agreement with this plan.  ED Discharge Orders    None       Bishop Dublin 09/04/18 2115    Little, Wenda Overland, MD 09/04/18 [phone]

## 2018-09-04 NOTE — ED Notes (Signed)
Patient verbalizes understanding of medications and discharge instructions. No further questions at this time. VSS and patient ambulatory at discharge.   

## 2018-09-04 NOTE — Discharge Instructions (Signed)
Please avoid taking your Phenergan given that you were found to have a prolonged Qtc on your EKG today.   Please make sure to stay well-hydrated and monitor symptoms.  Please do not miss your dialysis session next week.  Please follow up with your doctor next week and return to the ER for new or worsening symptoms in the meantime including if you have any of the following symptoms:  Abdominal pain that does not go away.  You have a fever.  You keep throwing up (vomiting).  The pain is felt only in portions of the abdomen. Pain in the right side could possibly be appendicitis. In an adult, pain in the left lower portion of the abdomen could be colitis or diverticulitis.  You pass bloody or black tarry stools.  There is bright red blood in the stool.  The constipation stays for more than 4 days.  There is belly (abdominal) or rectal pain.  You do not seem to be getting better.  You have any questions or concerns.

## 2018-09-04 NOTE — ED Notes (Addendum)
Patient states he did not take his HTN meds today since he had dialysis. MD made aware of BP.

## 2018-09-07 DIAGNOSIS — N186 End stage renal disease: Secondary | ICD-10-CM | POA: Diagnosis not present

## 2018-09-07 DIAGNOSIS — N2581 Secondary hyperparathyroidism of renal origin: Secondary | ICD-10-CM | POA: Diagnosis not present

## 2018-09-07 DIAGNOSIS — D631 Anemia in chronic kidney disease: Secondary | ICD-10-CM | POA: Diagnosis not present

## 2018-09-07 DIAGNOSIS — D509 Iron deficiency anemia, unspecified: Secondary | ICD-10-CM | POA: Diagnosis not present

## 2018-09-07 DIAGNOSIS — E119 Type 2 diabetes mellitus without complications: Secondary | ICD-10-CM | POA: Diagnosis not present

## 2018-09-09 DIAGNOSIS — D509 Iron deficiency anemia, unspecified: Secondary | ICD-10-CM | POA: Diagnosis not present

## 2018-09-09 DIAGNOSIS — D631 Anemia in chronic kidney disease: Secondary | ICD-10-CM | POA: Diagnosis not present

## 2018-09-09 DIAGNOSIS — E119 Type 2 diabetes mellitus without complications: Secondary | ICD-10-CM | POA: Diagnosis not present

## 2018-09-09 DIAGNOSIS — N186 End stage renal disease: Secondary | ICD-10-CM | POA: Diagnosis not present

## 2018-09-09 DIAGNOSIS — N2581 Secondary hyperparathyroidism of renal origin: Secondary | ICD-10-CM | POA: Diagnosis not present

## 2018-09-11 DIAGNOSIS — E119 Type 2 diabetes mellitus without complications: Secondary | ICD-10-CM | POA: Diagnosis not present

## 2018-09-11 DIAGNOSIS — N186 End stage renal disease: Secondary | ICD-10-CM | POA: Diagnosis not present

## 2018-09-11 DIAGNOSIS — N2581 Secondary hyperparathyroidism of renal origin: Secondary | ICD-10-CM | POA: Diagnosis not present

## 2018-09-11 DIAGNOSIS — D509 Iron deficiency anemia, unspecified: Secondary | ICD-10-CM | POA: Diagnosis not present

## 2018-09-11 DIAGNOSIS — D631 Anemia in chronic kidney disease: Secondary | ICD-10-CM | POA: Diagnosis not present

## 2018-09-13 ENCOUNTER — Telehealth: Payer: Self-pay | Admitting: Cardiology

## 2018-09-13 DIAGNOSIS — H9312 Tinnitus, left ear: Secondary | ICD-10-CM | POA: Diagnosis not present

## 2018-09-13 NOTE — Telephone Encounter (Signed)
lmtcb - I yr recall.  Pt needs virtual now or can schedule ov in august/dc

## 2018-09-14 DIAGNOSIS — N2581 Secondary hyperparathyroidism of renal origin: Secondary | ICD-10-CM | POA: Diagnosis not present

## 2018-09-14 DIAGNOSIS — N186 End stage renal disease: Secondary | ICD-10-CM | POA: Diagnosis not present

## 2018-09-14 DIAGNOSIS — D509 Iron deficiency anemia, unspecified: Secondary | ICD-10-CM | POA: Diagnosis not present

## 2018-09-14 DIAGNOSIS — E119 Type 2 diabetes mellitus without complications: Secondary | ICD-10-CM | POA: Diagnosis not present

## 2018-09-14 DIAGNOSIS — D631 Anemia in chronic kidney disease: Secondary | ICD-10-CM | POA: Diagnosis not present

## 2018-09-16 DIAGNOSIS — D509 Iron deficiency anemia, unspecified: Secondary | ICD-10-CM | POA: Diagnosis not present

## 2018-09-16 DIAGNOSIS — E119 Type 2 diabetes mellitus without complications: Secondary | ICD-10-CM | POA: Diagnosis not present

## 2018-09-16 DIAGNOSIS — N186 End stage renal disease: Secondary | ICD-10-CM | POA: Diagnosis not present

## 2018-09-16 DIAGNOSIS — N2581 Secondary hyperparathyroidism of renal origin: Secondary | ICD-10-CM | POA: Diagnosis not present

## 2018-09-16 DIAGNOSIS — D631 Anemia in chronic kidney disease: Secondary | ICD-10-CM | POA: Diagnosis not present

## 2018-09-18 DIAGNOSIS — N2581 Secondary hyperparathyroidism of renal origin: Secondary | ICD-10-CM | POA: Diagnosis not present

## 2018-09-18 DIAGNOSIS — N186 End stage renal disease: Secondary | ICD-10-CM | POA: Diagnosis not present

## 2018-09-18 DIAGNOSIS — E119 Type 2 diabetes mellitus without complications: Secondary | ICD-10-CM | POA: Diagnosis not present

## 2018-09-18 DIAGNOSIS — D631 Anemia in chronic kidney disease: Secondary | ICD-10-CM | POA: Diagnosis not present

## 2018-09-18 DIAGNOSIS — D509 Iron deficiency anemia, unspecified: Secondary | ICD-10-CM | POA: Diagnosis not present

## 2018-09-20 ENCOUNTER — Telehealth: Payer: Self-pay | Admitting: *Deleted

## 2018-09-20 NOTE — Telephone Encounter (Signed)
LMOM @ 11:08 am, [id number].

## 2018-09-21 DIAGNOSIS — D509 Iron deficiency anemia, unspecified: Secondary | ICD-10-CM | POA: Diagnosis not present

## 2018-09-21 DIAGNOSIS — E119 Type 2 diabetes mellitus without complications: Secondary | ICD-10-CM | POA: Diagnosis not present

## 2018-09-21 DIAGNOSIS — N2581 Secondary hyperparathyroidism of renal origin: Secondary | ICD-10-CM | POA: Diagnosis not present

## 2018-09-21 DIAGNOSIS — D631 Anemia in chronic kidney disease: Secondary | ICD-10-CM | POA: Diagnosis not present

## 2018-09-21 DIAGNOSIS — N186 End stage renal disease: Secondary | ICD-10-CM | POA: Diagnosis not present

## 2018-09-23 DIAGNOSIS — N186 End stage renal disease: Secondary | ICD-10-CM | POA: Diagnosis not present

## 2018-09-23 DIAGNOSIS — E119 Type 2 diabetes mellitus without complications: Secondary | ICD-10-CM | POA: Diagnosis not present

## 2018-09-23 DIAGNOSIS — D509 Iron deficiency anemia, unspecified: Secondary | ICD-10-CM | POA: Diagnosis not present

## 2018-09-23 DIAGNOSIS — D631 Anemia in chronic kidney disease: Secondary | ICD-10-CM | POA: Diagnosis not present

## 2018-09-23 DIAGNOSIS — N2581 Secondary hyperparathyroidism of renal origin: Secondary | ICD-10-CM | POA: Diagnosis not present

## 2018-09-24 DIAGNOSIS — N186 End stage renal disease: Secondary | ICD-10-CM | POA: Diagnosis not present

## 2018-09-24 DIAGNOSIS — I120 Hypertensive chronic kidney disease with stage 5 chronic kidney disease or end stage renal disease: Secondary | ICD-10-CM | POA: Diagnosis not present

## 2018-09-24 DIAGNOSIS — Z992 Dependence on renal dialysis: Secondary | ICD-10-CM | POA: Diagnosis not present

## 2018-09-25 DIAGNOSIS — D631 Anemia in chronic kidney disease: Secondary | ICD-10-CM | POA: Diagnosis not present

## 2018-09-25 DIAGNOSIS — E119 Type 2 diabetes mellitus without complications: Secondary | ICD-10-CM | POA: Diagnosis not present

## 2018-09-25 DIAGNOSIS — D509 Iron deficiency anemia, unspecified: Secondary | ICD-10-CM | POA: Diagnosis not present

## 2018-09-25 DIAGNOSIS — N186 End stage renal disease: Secondary | ICD-10-CM | POA: Diagnosis not present

## 2018-09-25 DIAGNOSIS — N2581 Secondary hyperparathyroidism of renal origin: Secondary | ICD-10-CM | POA: Diagnosis not present

## 2018-09-28 DIAGNOSIS — D509 Iron deficiency anemia, unspecified: Secondary | ICD-10-CM | POA: Diagnosis not present

## 2018-09-28 DIAGNOSIS — D631 Anemia in chronic kidney disease: Secondary | ICD-10-CM | POA: Diagnosis not present

## 2018-09-28 DIAGNOSIS — E119 Type 2 diabetes mellitus without complications: Secondary | ICD-10-CM | POA: Diagnosis not present

## 2018-09-28 DIAGNOSIS — N2581 Secondary hyperparathyroidism of renal origin: Secondary | ICD-10-CM | POA: Diagnosis not present

## 2018-09-28 DIAGNOSIS — N186 End stage renal disease: Secondary | ICD-10-CM | POA: Diagnosis not present

## 2018-09-30 DIAGNOSIS — D509 Iron deficiency anemia, unspecified: Secondary | ICD-10-CM | POA: Diagnosis not present

## 2018-09-30 DIAGNOSIS — E119 Type 2 diabetes mellitus without complications: Secondary | ICD-10-CM | POA: Diagnosis not present

## 2018-09-30 DIAGNOSIS — D631 Anemia in chronic kidney disease: Secondary | ICD-10-CM | POA: Diagnosis not present

## 2018-09-30 DIAGNOSIS — N2581 Secondary hyperparathyroidism of renal origin: Secondary | ICD-10-CM | POA: Diagnosis not present

## 2018-09-30 DIAGNOSIS — N186 End stage renal disease: Secondary | ICD-10-CM | POA: Diagnosis not present

## 2018-10-01 ENCOUNTER — Telehealth: Payer: Self-pay | Admitting: Cardiology

## 2018-10-01 NOTE — Telephone Encounter (Signed)
LVM to schedule recall. °

## 2018-10-02 DIAGNOSIS — N2581 Secondary hyperparathyroidism of renal origin: Secondary | ICD-10-CM | POA: Diagnosis not present

## 2018-10-02 DIAGNOSIS — D509 Iron deficiency anemia, unspecified: Secondary | ICD-10-CM | POA: Diagnosis not present

## 2018-10-02 DIAGNOSIS — N186 End stage renal disease: Secondary | ICD-10-CM | POA: Diagnosis not present

## 2018-10-02 DIAGNOSIS — D631 Anemia in chronic kidney disease: Secondary | ICD-10-CM | POA: Diagnosis not present

## 2018-10-02 DIAGNOSIS — E119 Type 2 diabetes mellitus without complications: Secondary | ICD-10-CM | POA: Diagnosis not present

## 2018-10-05 DIAGNOSIS — N186 End stage renal disease: Secondary | ICD-10-CM | POA: Diagnosis not present

## 2018-10-05 DIAGNOSIS — D509 Iron deficiency anemia, unspecified: Secondary | ICD-10-CM | POA: Diagnosis not present

## 2018-10-05 DIAGNOSIS — E119 Type 2 diabetes mellitus without complications: Secondary | ICD-10-CM | POA: Diagnosis not present

## 2018-10-05 DIAGNOSIS — N2581 Secondary hyperparathyroidism of renal origin: Secondary | ICD-10-CM | POA: Diagnosis not present

## 2018-10-05 DIAGNOSIS — D631 Anemia in chronic kidney disease: Secondary | ICD-10-CM | POA: Diagnosis not present

## 2018-10-07 DIAGNOSIS — N186 End stage renal disease: Secondary | ICD-10-CM | POA: Diagnosis not present

## 2018-10-07 DIAGNOSIS — D509 Iron deficiency anemia, unspecified: Secondary | ICD-10-CM | POA: Diagnosis not present

## 2018-10-07 DIAGNOSIS — E119 Type 2 diabetes mellitus without complications: Secondary | ICD-10-CM | POA: Diagnosis not present

## 2018-10-07 DIAGNOSIS — D631 Anemia in chronic kidney disease: Secondary | ICD-10-CM | POA: Diagnosis not present

## 2018-10-07 DIAGNOSIS — N2581 Secondary hyperparathyroidism of renal origin: Secondary | ICD-10-CM | POA: Diagnosis not present

## 2018-10-12 DIAGNOSIS — D631 Anemia in chronic kidney disease: Secondary | ICD-10-CM | POA: Diagnosis not present

## 2018-10-12 DIAGNOSIS — E119 Type 2 diabetes mellitus without complications: Secondary | ICD-10-CM | POA: Diagnosis not present

## 2018-10-12 DIAGNOSIS — N2581 Secondary hyperparathyroidism of renal origin: Secondary | ICD-10-CM | POA: Diagnosis not present

## 2018-10-12 DIAGNOSIS — N186 End stage renal disease: Secondary | ICD-10-CM | POA: Diagnosis not present

## 2018-10-12 DIAGNOSIS — D509 Iron deficiency anemia, unspecified: Secondary | ICD-10-CM | POA: Diagnosis not present

## 2018-10-14 DIAGNOSIS — D509 Iron deficiency anemia, unspecified: Secondary | ICD-10-CM | POA: Diagnosis not present

## 2018-10-14 DIAGNOSIS — N2581 Secondary hyperparathyroidism of renal origin: Secondary | ICD-10-CM | POA: Diagnosis not present

## 2018-10-14 DIAGNOSIS — E119 Type 2 diabetes mellitus without complications: Secondary | ICD-10-CM | POA: Diagnosis not present

## 2018-10-14 DIAGNOSIS — N186 End stage renal disease: Secondary | ICD-10-CM | POA: Diagnosis not present

## 2018-10-14 DIAGNOSIS — D631 Anemia in chronic kidney disease: Secondary | ICD-10-CM | POA: Diagnosis not present

## 2018-10-16 DIAGNOSIS — D509 Iron deficiency anemia, unspecified: Secondary | ICD-10-CM | POA: Diagnosis not present

## 2018-10-16 DIAGNOSIS — E119 Type 2 diabetes mellitus without complications: Secondary | ICD-10-CM | POA: Diagnosis not present

## 2018-10-16 DIAGNOSIS — N186 End stage renal disease: Secondary | ICD-10-CM | POA: Diagnosis not present

## 2018-10-16 DIAGNOSIS — D631 Anemia in chronic kidney disease: Secondary | ICD-10-CM | POA: Diagnosis not present

## 2018-10-16 DIAGNOSIS — N2581 Secondary hyperparathyroidism of renal origin: Secondary | ICD-10-CM | POA: Diagnosis not present

## 2018-10-19 DIAGNOSIS — N186 End stage renal disease: Secondary | ICD-10-CM | POA: Diagnosis not present

## 2018-10-19 DIAGNOSIS — D631 Anemia in chronic kidney disease: Secondary | ICD-10-CM | POA: Diagnosis not present

## 2018-10-19 DIAGNOSIS — N2581 Secondary hyperparathyroidism of renal origin: Secondary | ICD-10-CM | POA: Diagnosis not present

## 2018-10-19 DIAGNOSIS — E119 Type 2 diabetes mellitus without complications: Secondary | ICD-10-CM | POA: Diagnosis not present

## 2018-10-19 DIAGNOSIS — D509 Iron deficiency anemia, unspecified: Secondary | ICD-10-CM | POA: Diagnosis not present

## 2018-10-21 DIAGNOSIS — E119 Type 2 diabetes mellitus without complications: Secondary | ICD-10-CM | POA: Diagnosis not present

## 2018-10-21 DIAGNOSIS — N2581 Secondary hyperparathyroidism of renal origin: Secondary | ICD-10-CM | POA: Diagnosis not present

## 2018-10-21 DIAGNOSIS — D631 Anemia in chronic kidney disease: Secondary | ICD-10-CM | POA: Diagnosis not present

## 2018-10-21 DIAGNOSIS — N186 End stage renal disease: Secondary | ICD-10-CM | POA: Diagnosis not present

## 2018-10-21 DIAGNOSIS — D509 Iron deficiency anemia, unspecified: Secondary | ICD-10-CM | POA: Diagnosis not present

## 2018-10-23 DIAGNOSIS — D509 Iron deficiency anemia, unspecified: Secondary | ICD-10-CM | POA: Diagnosis not present

## 2018-10-23 DIAGNOSIS — D631 Anemia in chronic kidney disease: Secondary | ICD-10-CM | POA: Diagnosis not present

## 2018-10-23 DIAGNOSIS — E119 Type 2 diabetes mellitus without complications: Secondary | ICD-10-CM | POA: Diagnosis not present

## 2018-10-23 DIAGNOSIS — N186 End stage renal disease: Secondary | ICD-10-CM | POA: Diagnosis not present

## 2018-10-23 DIAGNOSIS — N2581 Secondary hyperparathyroidism of renal origin: Secondary | ICD-10-CM | POA: Diagnosis not present

## 2018-10-25 DIAGNOSIS — Z992 Dependence on renal dialysis: Secondary | ICD-10-CM | POA: Diagnosis not present

## 2018-10-25 DIAGNOSIS — I120 Hypertensive chronic kidney disease with stage 5 chronic kidney disease or end stage renal disease: Secondary | ICD-10-CM | POA: Diagnosis not present

## 2018-10-25 DIAGNOSIS — N186 End stage renal disease: Secondary | ICD-10-CM | POA: Diagnosis not present

## 2018-10-26 DIAGNOSIS — D509 Iron deficiency anemia, unspecified: Secondary | ICD-10-CM | POA: Diagnosis not present

## 2018-10-26 DIAGNOSIS — N2581 Secondary hyperparathyroidism of renal origin: Secondary | ICD-10-CM | POA: Diagnosis not present

## 2018-10-26 DIAGNOSIS — E119 Type 2 diabetes mellitus without complications: Secondary | ICD-10-CM | POA: Diagnosis not present

## 2018-10-26 DIAGNOSIS — D631 Anemia in chronic kidney disease: Secondary | ICD-10-CM | POA: Diagnosis not present

## 2018-10-26 DIAGNOSIS — N186 End stage renal disease: Secondary | ICD-10-CM | POA: Diagnosis not present

## 2018-10-28 DIAGNOSIS — N2581 Secondary hyperparathyroidism of renal origin: Secondary | ICD-10-CM | POA: Diagnosis not present

## 2018-10-28 DIAGNOSIS — E119 Type 2 diabetes mellitus without complications: Secondary | ICD-10-CM | POA: Diagnosis not present

## 2018-10-28 DIAGNOSIS — D509 Iron deficiency anemia, unspecified: Secondary | ICD-10-CM | POA: Diagnosis not present

## 2018-10-28 DIAGNOSIS — N186 End stage renal disease: Secondary | ICD-10-CM | POA: Diagnosis not present

## 2018-10-28 DIAGNOSIS — D631 Anemia in chronic kidney disease: Secondary | ICD-10-CM | POA: Diagnosis not present

## 2018-10-30 DIAGNOSIS — N2581 Secondary hyperparathyroidism of renal origin: Secondary | ICD-10-CM | POA: Diagnosis not present

## 2018-10-30 DIAGNOSIS — N186 End stage renal disease: Secondary | ICD-10-CM | POA: Diagnosis not present

## 2018-10-30 DIAGNOSIS — E119 Type 2 diabetes mellitus without complications: Secondary | ICD-10-CM | POA: Diagnosis not present

## 2018-10-30 DIAGNOSIS — D509 Iron deficiency anemia, unspecified: Secondary | ICD-10-CM | POA: Diagnosis not present

## 2018-10-30 DIAGNOSIS — D631 Anemia in chronic kidney disease: Secondary | ICD-10-CM | POA: Diagnosis not present

## 2018-11-02 DIAGNOSIS — D631 Anemia in chronic kidney disease: Secondary | ICD-10-CM | POA: Diagnosis not present

## 2018-11-02 DIAGNOSIS — N186 End stage renal disease: Secondary | ICD-10-CM | POA: Diagnosis not present

## 2018-11-02 DIAGNOSIS — N2581 Secondary hyperparathyroidism of renal origin: Secondary | ICD-10-CM | POA: Diagnosis not present

## 2018-11-02 DIAGNOSIS — E119 Type 2 diabetes mellitus without complications: Secondary | ICD-10-CM | POA: Diagnosis not present

## 2018-11-02 DIAGNOSIS — D509 Iron deficiency anemia, unspecified: Secondary | ICD-10-CM | POA: Diagnosis not present

## 2018-11-04 DIAGNOSIS — E119 Type 2 diabetes mellitus without complications: Secondary | ICD-10-CM | POA: Diagnosis not present

## 2018-11-04 DIAGNOSIS — D509 Iron deficiency anemia, unspecified: Secondary | ICD-10-CM | POA: Diagnosis not present

## 2018-11-04 DIAGNOSIS — N2581 Secondary hyperparathyroidism of renal origin: Secondary | ICD-10-CM | POA: Diagnosis not present

## 2018-11-04 DIAGNOSIS — N186 End stage renal disease: Secondary | ICD-10-CM | POA: Diagnosis not present

## 2018-11-04 DIAGNOSIS — D631 Anemia in chronic kidney disease: Secondary | ICD-10-CM | POA: Diagnosis not present

## 2018-11-06 DIAGNOSIS — N2581 Secondary hyperparathyroidism of renal origin: Secondary | ICD-10-CM | POA: Diagnosis not present

## 2018-11-06 DIAGNOSIS — D509 Iron deficiency anemia, unspecified: Secondary | ICD-10-CM | POA: Diagnosis not present

## 2018-11-06 DIAGNOSIS — N186 End stage renal disease: Secondary | ICD-10-CM | POA: Diagnosis not present

## 2018-11-06 DIAGNOSIS — E119 Type 2 diabetes mellitus without complications: Secondary | ICD-10-CM | POA: Diagnosis not present

## 2018-11-06 DIAGNOSIS — D631 Anemia in chronic kidney disease: Secondary | ICD-10-CM | POA: Diagnosis not present

## 2018-11-09 DIAGNOSIS — N186 End stage renal disease: Secondary | ICD-10-CM | POA: Diagnosis not present

## 2018-11-09 DIAGNOSIS — D631 Anemia in chronic kidney disease: Secondary | ICD-10-CM | POA: Diagnosis not present

## 2018-11-09 DIAGNOSIS — E119 Type 2 diabetes mellitus without complications: Secondary | ICD-10-CM | POA: Diagnosis not present

## 2018-11-09 DIAGNOSIS — D509 Iron deficiency anemia, unspecified: Secondary | ICD-10-CM | POA: Diagnosis not present

## 2018-11-09 DIAGNOSIS — N2581 Secondary hyperparathyroidism of renal origin: Secondary | ICD-10-CM | POA: Diagnosis not present

## 2018-11-11 DIAGNOSIS — E119 Type 2 diabetes mellitus without complications: Secondary | ICD-10-CM | POA: Diagnosis not present

## 2018-11-11 DIAGNOSIS — D631 Anemia in chronic kidney disease: Secondary | ICD-10-CM | POA: Diagnosis not present

## 2018-11-11 DIAGNOSIS — N2581 Secondary hyperparathyroidism of renal origin: Secondary | ICD-10-CM | POA: Diagnosis not present

## 2018-11-11 DIAGNOSIS — N186 End stage renal disease: Secondary | ICD-10-CM | POA: Diagnosis not present

## 2018-11-11 DIAGNOSIS — D509 Iron deficiency anemia, unspecified: Secondary | ICD-10-CM | POA: Diagnosis not present

## 2018-11-13 DIAGNOSIS — E119 Type 2 diabetes mellitus without complications: Secondary | ICD-10-CM | POA: Diagnosis not present

## 2018-11-13 DIAGNOSIS — N2581 Secondary hyperparathyroidism of renal origin: Secondary | ICD-10-CM | POA: Diagnosis not present

## 2018-11-13 DIAGNOSIS — N186 End stage renal disease: Secondary | ICD-10-CM | POA: Diagnosis not present

## 2018-11-13 DIAGNOSIS — D509 Iron deficiency anemia, unspecified: Secondary | ICD-10-CM | POA: Diagnosis not present

## 2018-11-13 DIAGNOSIS — D631 Anemia in chronic kidney disease: Secondary | ICD-10-CM | POA: Diagnosis not present

## 2018-11-16 DIAGNOSIS — E119 Type 2 diabetes mellitus without complications: Secondary | ICD-10-CM | POA: Diagnosis not present

## 2018-11-16 DIAGNOSIS — D631 Anemia in chronic kidney disease: Secondary | ICD-10-CM | POA: Diagnosis not present

## 2018-11-16 DIAGNOSIS — N2581 Secondary hyperparathyroidism of renal origin: Secondary | ICD-10-CM | POA: Diagnosis not present

## 2018-11-16 DIAGNOSIS — D509 Iron deficiency anemia, unspecified: Secondary | ICD-10-CM | POA: Diagnosis not present

## 2018-11-16 DIAGNOSIS — N186 End stage renal disease: Secondary | ICD-10-CM | POA: Diagnosis not present

## 2018-11-20 DIAGNOSIS — D631 Anemia in chronic kidney disease: Secondary | ICD-10-CM | POA: Diagnosis not present

## 2018-11-20 DIAGNOSIS — D509 Iron deficiency anemia, unspecified: Secondary | ICD-10-CM | POA: Diagnosis not present

## 2018-11-20 DIAGNOSIS — E119 Type 2 diabetes mellitus without complications: Secondary | ICD-10-CM | POA: Diagnosis not present

## 2018-11-20 DIAGNOSIS — N186 End stage renal disease: Secondary | ICD-10-CM | POA: Diagnosis not present

## 2018-11-20 DIAGNOSIS — N2581 Secondary hyperparathyroidism of renal origin: Secondary | ICD-10-CM | POA: Diagnosis not present

## 2018-11-23 DIAGNOSIS — N2581 Secondary hyperparathyroidism of renal origin: Secondary | ICD-10-CM | POA: Diagnosis not present

## 2018-11-23 DIAGNOSIS — E119 Type 2 diabetes mellitus without complications: Secondary | ICD-10-CM | POA: Diagnosis not present

## 2018-11-23 DIAGNOSIS — N186 End stage renal disease: Secondary | ICD-10-CM | POA: Diagnosis not present

## 2018-11-23 DIAGNOSIS — D631 Anemia in chronic kidney disease: Secondary | ICD-10-CM | POA: Diagnosis not present

## 2018-11-23 DIAGNOSIS — D509 Iron deficiency anemia, unspecified: Secondary | ICD-10-CM | POA: Diagnosis not present

## 2018-11-24 DIAGNOSIS — I120 Hypertensive chronic kidney disease with stage 5 chronic kidney disease or end stage renal disease: Secondary | ICD-10-CM | POA: Diagnosis not present

## 2018-11-24 DIAGNOSIS — N186 End stage renal disease: Secondary | ICD-10-CM | POA: Diagnosis not present

## 2018-11-24 DIAGNOSIS — Z992 Dependence on renal dialysis: Secondary | ICD-10-CM | POA: Diagnosis not present

## 2018-11-25 DIAGNOSIS — D631 Anemia in chronic kidney disease: Secondary | ICD-10-CM | POA: Diagnosis not present

## 2018-11-25 DIAGNOSIS — E119 Type 2 diabetes mellitus without complications: Secondary | ICD-10-CM | POA: Diagnosis not present

## 2018-11-25 DIAGNOSIS — N2581 Secondary hyperparathyroidism of renal origin: Secondary | ICD-10-CM | POA: Diagnosis not present

## 2018-11-25 DIAGNOSIS — N186 End stage renal disease: Secondary | ICD-10-CM | POA: Diagnosis not present

## 2018-11-25 DIAGNOSIS — D509 Iron deficiency anemia, unspecified: Secondary | ICD-10-CM | POA: Diagnosis not present

## 2018-11-27 DIAGNOSIS — N2581 Secondary hyperparathyroidism of renal origin: Secondary | ICD-10-CM | POA: Diagnosis not present

## 2018-11-27 DIAGNOSIS — E119 Type 2 diabetes mellitus without complications: Secondary | ICD-10-CM | POA: Diagnosis not present

## 2018-11-27 DIAGNOSIS — N186 End stage renal disease: Secondary | ICD-10-CM | POA: Diagnosis not present

## 2018-11-27 DIAGNOSIS — D509 Iron deficiency anemia, unspecified: Secondary | ICD-10-CM | POA: Diagnosis not present

## 2018-11-27 DIAGNOSIS — D631 Anemia in chronic kidney disease: Secondary | ICD-10-CM | POA: Diagnosis not present

## 2018-11-30 DIAGNOSIS — N2581 Secondary hyperparathyroidism of renal origin: Secondary | ICD-10-CM | POA: Diagnosis not present

## 2018-11-30 DIAGNOSIS — E119 Type 2 diabetes mellitus without complications: Secondary | ICD-10-CM | POA: Diagnosis not present

## 2018-11-30 DIAGNOSIS — N186 End stage renal disease: Secondary | ICD-10-CM | POA: Diagnosis not present

## 2018-11-30 DIAGNOSIS — D631 Anemia in chronic kidney disease: Secondary | ICD-10-CM | POA: Diagnosis not present

## 2018-11-30 DIAGNOSIS — D509 Iron deficiency anemia, unspecified: Secondary | ICD-10-CM | POA: Diagnosis not present

## 2018-12-04 DIAGNOSIS — N2581 Secondary hyperparathyroidism of renal origin: Secondary | ICD-10-CM | POA: Diagnosis not present

## 2018-12-04 DIAGNOSIS — N186 End stage renal disease: Secondary | ICD-10-CM | POA: Diagnosis not present

## 2018-12-04 DIAGNOSIS — D509 Iron deficiency anemia, unspecified: Secondary | ICD-10-CM | POA: Diagnosis not present

## 2018-12-04 DIAGNOSIS — D631 Anemia in chronic kidney disease: Secondary | ICD-10-CM | POA: Diagnosis not present

## 2018-12-04 DIAGNOSIS — E119 Type 2 diabetes mellitus without complications: Secondary | ICD-10-CM | POA: Diagnosis not present

## 2018-12-07 DIAGNOSIS — N2581 Secondary hyperparathyroidism of renal origin: Secondary | ICD-10-CM | POA: Diagnosis not present

## 2018-12-07 DIAGNOSIS — E1129 Type 2 diabetes mellitus with other diabetic kidney complication: Secondary | ICD-10-CM | POA: Diagnosis not present

## 2018-12-07 DIAGNOSIS — D509 Iron deficiency anemia, unspecified: Secondary | ICD-10-CM | POA: Diagnosis not present

## 2018-12-07 DIAGNOSIS — E119 Type 2 diabetes mellitus without complications: Secondary | ICD-10-CM | POA: Diagnosis not present

## 2018-12-07 DIAGNOSIS — D631 Anemia in chronic kidney disease: Secondary | ICD-10-CM | POA: Diagnosis not present

## 2018-12-07 DIAGNOSIS — N186 End stage renal disease: Secondary | ICD-10-CM | POA: Diagnosis not present

## 2018-12-09 DIAGNOSIS — E119 Type 2 diabetes mellitus without complications: Secondary | ICD-10-CM | POA: Diagnosis not present

## 2018-12-09 DIAGNOSIS — N2581 Secondary hyperparathyroidism of renal origin: Secondary | ICD-10-CM | POA: Diagnosis not present

## 2018-12-09 DIAGNOSIS — D509 Iron deficiency anemia, unspecified: Secondary | ICD-10-CM | POA: Diagnosis not present

## 2018-12-09 DIAGNOSIS — N186 End stage renal disease: Secondary | ICD-10-CM | POA: Diagnosis not present

## 2018-12-09 DIAGNOSIS — D631 Anemia in chronic kidney disease: Secondary | ICD-10-CM | POA: Diagnosis not present

## 2018-12-11 DIAGNOSIS — D509 Iron deficiency anemia, unspecified: Secondary | ICD-10-CM | POA: Diagnosis not present

## 2018-12-11 DIAGNOSIS — N2581 Secondary hyperparathyroidism of renal origin: Secondary | ICD-10-CM | POA: Diagnosis not present

## 2018-12-11 DIAGNOSIS — N186 End stage renal disease: Secondary | ICD-10-CM | POA: Diagnosis not present

## 2018-12-11 DIAGNOSIS — D631 Anemia in chronic kidney disease: Secondary | ICD-10-CM | POA: Diagnosis not present

## 2018-12-11 DIAGNOSIS — E119 Type 2 diabetes mellitus without complications: Secondary | ICD-10-CM | POA: Diagnosis not present

## 2018-12-14 DIAGNOSIS — N186 End stage renal disease: Secondary | ICD-10-CM | POA: Diagnosis not present

## 2018-12-14 DIAGNOSIS — E119 Type 2 diabetes mellitus without complications: Secondary | ICD-10-CM | POA: Diagnosis not present

## 2018-12-14 DIAGNOSIS — D509 Iron deficiency anemia, unspecified: Secondary | ICD-10-CM | POA: Diagnosis not present

## 2018-12-14 DIAGNOSIS — N2581 Secondary hyperparathyroidism of renal origin: Secondary | ICD-10-CM | POA: Diagnosis not present

## 2018-12-14 DIAGNOSIS — D631 Anemia in chronic kidney disease: Secondary | ICD-10-CM | POA: Diagnosis not present

## 2018-12-16 DIAGNOSIS — D509 Iron deficiency anemia, unspecified: Secondary | ICD-10-CM | POA: Diagnosis not present

## 2018-12-16 DIAGNOSIS — E119 Type 2 diabetes mellitus without complications: Secondary | ICD-10-CM | POA: Diagnosis not present

## 2018-12-16 DIAGNOSIS — N2581 Secondary hyperparathyroidism of renal origin: Secondary | ICD-10-CM | POA: Diagnosis not present

## 2018-12-16 DIAGNOSIS — D631 Anemia in chronic kidney disease: Secondary | ICD-10-CM | POA: Diagnosis not present

## 2018-12-16 DIAGNOSIS — N186 End stage renal disease: Secondary | ICD-10-CM | POA: Diagnosis not present

## 2018-12-21 DIAGNOSIS — N186 End stage renal disease: Secondary | ICD-10-CM | POA: Diagnosis not present

## 2018-12-21 DIAGNOSIS — D631 Anemia in chronic kidney disease: Secondary | ICD-10-CM | POA: Diagnosis not present

## 2018-12-21 DIAGNOSIS — N2581 Secondary hyperparathyroidism of renal origin: Secondary | ICD-10-CM | POA: Diagnosis not present

## 2018-12-21 DIAGNOSIS — D509 Iron deficiency anemia, unspecified: Secondary | ICD-10-CM | POA: Diagnosis not present

## 2018-12-21 DIAGNOSIS — E119 Type 2 diabetes mellitus without complications: Secondary | ICD-10-CM | POA: Diagnosis not present

## 2018-12-23 DIAGNOSIS — E119 Type 2 diabetes mellitus without complications: Secondary | ICD-10-CM | POA: Diagnosis not present

## 2018-12-23 DIAGNOSIS — D509 Iron deficiency anemia, unspecified: Secondary | ICD-10-CM | POA: Diagnosis not present

## 2018-12-23 DIAGNOSIS — D631 Anemia in chronic kidney disease: Secondary | ICD-10-CM | POA: Diagnosis not present

## 2018-12-23 DIAGNOSIS — N186 End stage renal disease: Secondary | ICD-10-CM | POA: Diagnosis not present

## 2018-12-23 DIAGNOSIS — N2581 Secondary hyperparathyroidism of renal origin: Secondary | ICD-10-CM | POA: Diagnosis not present

## 2018-12-25 DIAGNOSIS — I120 Hypertensive chronic kidney disease with stage 5 chronic kidney disease or end stage renal disease: Secondary | ICD-10-CM | POA: Diagnosis not present

## 2018-12-25 DIAGNOSIS — N2581 Secondary hyperparathyroidism of renal origin: Secondary | ICD-10-CM | POA: Diagnosis not present

## 2018-12-25 DIAGNOSIS — D631 Anemia in chronic kidney disease: Secondary | ICD-10-CM | POA: Diagnosis not present

## 2018-12-25 DIAGNOSIS — Z992 Dependence on renal dialysis: Secondary | ICD-10-CM | POA: Diagnosis not present

## 2018-12-25 DIAGNOSIS — E119 Type 2 diabetes mellitus without complications: Secondary | ICD-10-CM | POA: Diagnosis not present

## 2018-12-25 DIAGNOSIS — D509 Iron deficiency anemia, unspecified: Secondary | ICD-10-CM | POA: Diagnosis not present

## 2018-12-25 DIAGNOSIS — N186 End stage renal disease: Secondary | ICD-10-CM | POA: Diagnosis not present

## 2018-12-28 DIAGNOSIS — N186 End stage renal disease: Secondary | ICD-10-CM | POA: Diagnosis not present

## 2018-12-28 DIAGNOSIS — D631 Anemia in chronic kidney disease: Secondary | ICD-10-CM | POA: Diagnosis not present

## 2018-12-28 DIAGNOSIS — N2581 Secondary hyperparathyroidism of renal origin: Secondary | ICD-10-CM | POA: Diagnosis not present

## 2018-12-28 DIAGNOSIS — D509 Iron deficiency anemia, unspecified: Secondary | ICD-10-CM | POA: Diagnosis not present

## 2018-12-28 DIAGNOSIS — E119 Type 2 diabetes mellitus without complications: Secondary | ICD-10-CM | POA: Diagnosis not present

## 2018-12-28 DIAGNOSIS — Z992 Dependence on renal dialysis: Secondary | ICD-10-CM | POA: Diagnosis not present

## 2018-12-30 DIAGNOSIS — N2581 Secondary hyperparathyroidism of renal origin: Secondary | ICD-10-CM | POA: Diagnosis not present

## 2018-12-30 DIAGNOSIS — D631 Anemia in chronic kidney disease: Secondary | ICD-10-CM | POA: Diagnosis not present

## 2018-12-30 DIAGNOSIS — Z992 Dependence on renal dialysis: Secondary | ICD-10-CM | POA: Diagnosis not present

## 2018-12-30 DIAGNOSIS — E119 Type 2 diabetes mellitus without complications: Secondary | ICD-10-CM | POA: Diagnosis not present

## 2018-12-30 DIAGNOSIS — D509 Iron deficiency anemia, unspecified: Secondary | ICD-10-CM | POA: Diagnosis not present

## 2018-12-30 DIAGNOSIS — N186 End stage renal disease: Secondary | ICD-10-CM | POA: Diagnosis not present

## 2019-01-01 DIAGNOSIS — E119 Type 2 diabetes mellitus without complications: Secondary | ICD-10-CM | POA: Diagnosis not present

## 2019-01-01 DIAGNOSIS — D509 Iron deficiency anemia, unspecified: Secondary | ICD-10-CM | POA: Diagnosis not present

## 2019-01-01 DIAGNOSIS — N186 End stage renal disease: Secondary | ICD-10-CM | POA: Diagnosis not present

## 2019-01-01 DIAGNOSIS — D631 Anemia in chronic kidney disease: Secondary | ICD-10-CM | POA: Diagnosis not present

## 2019-01-01 DIAGNOSIS — Z992 Dependence on renal dialysis: Secondary | ICD-10-CM | POA: Diagnosis not present

## 2019-01-01 DIAGNOSIS — N2581 Secondary hyperparathyroidism of renal origin: Secondary | ICD-10-CM | POA: Diagnosis not present

## 2019-01-06 DIAGNOSIS — N186 End stage renal disease: Secondary | ICD-10-CM | POA: Diagnosis not present

## 2019-01-06 DIAGNOSIS — N2581 Secondary hyperparathyroidism of renal origin: Secondary | ICD-10-CM | POA: Diagnosis not present

## 2019-01-06 DIAGNOSIS — D631 Anemia in chronic kidney disease: Secondary | ICD-10-CM | POA: Diagnosis not present

## 2019-01-06 DIAGNOSIS — Z992 Dependence on renal dialysis: Secondary | ICD-10-CM | POA: Diagnosis not present

## 2019-01-06 DIAGNOSIS — D509 Iron deficiency anemia, unspecified: Secondary | ICD-10-CM | POA: Diagnosis not present

## 2019-01-06 DIAGNOSIS — E119 Type 2 diabetes mellitus without complications: Secondary | ICD-10-CM | POA: Diagnosis not present

## 2019-01-08 DIAGNOSIS — E119 Type 2 diabetes mellitus without complications: Secondary | ICD-10-CM | POA: Diagnosis not present

## 2019-01-08 DIAGNOSIS — Z992 Dependence on renal dialysis: Secondary | ICD-10-CM | POA: Diagnosis not present

## 2019-01-08 DIAGNOSIS — D509 Iron deficiency anemia, unspecified: Secondary | ICD-10-CM | POA: Diagnosis not present

## 2019-01-08 DIAGNOSIS — N2581 Secondary hyperparathyroidism of renal origin: Secondary | ICD-10-CM | POA: Diagnosis not present

## 2019-01-08 DIAGNOSIS — N186 End stage renal disease: Secondary | ICD-10-CM | POA: Diagnosis not present

## 2019-01-08 DIAGNOSIS — D631 Anemia in chronic kidney disease: Secondary | ICD-10-CM | POA: Diagnosis not present

## 2019-01-11 DIAGNOSIS — E119 Type 2 diabetes mellitus without complications: Secondary | ICD-10-CM | POA: Diagnosis not present

## 2019-01-11 DIAGNOSIS — D509 Iron deficiency anemia, unspecified: Secondary | ICD-10-CM | POA: Diagnosis not present

## 2019-01-11 DIAGNOSIS — D631 Anemia in chronic kidney disease: Secondary | ICD-10-CM | POA: Diagnosis not present

## 2019-01-11 DIAGNOSIS — Z992 Dependence on renal dialysis: Secondary | ICD-10-CM | POA: Diagnosis not present

## 2019-01-11 DIAGNOSIS — N186 End stage renal disease: Secondary | ICD-10-CM | POA: Diagnosis not present

## 2019-01-11 DIAGNOSIS — N2581 Secondary hyperparathyroidism of renal origin: Secondary | ICD-10-CM | POA: Diagnosis not present

## 2019-01-13 DIAGNOSIS — Z992 Dependence on renal dialysis: Secondary | ICD-10-CM | POA: Diagnosis not present

## 2019-01-13 DIAGNOSIS — D631 Anemia in chronic kidney disease: Secondary | ICD-10-CM | POA: Diagnosis not present

## 2019-01-13 DIAGNOSIS — N2581 Secondary hyperparathyroidism of renal origin: Secondary | ICD-10-CM | POA: Diagnosis not present

## 2019-01-13 DIAGNOSIS — E119 Type 2 diabetes mellitus without complications: Secondary | ICD-10-CM | POA: Diagnosis not present

## 2019-01-13 DIAGNOSIS — D509 Iron deficiency anemia, unspecified: Secondary | ICD-10-CM | POA: Diagnosis not present

## 2019-01-13 DIAGNOSIS — N186 End stage renal disease: Secondary | ICD-10-CM | POA: Diagnosis not present

## 2019-01-15 DIAGNOSIS — D631 Anemia in chronic kidney disease: Secondary | ICD-10-CM | POA: Diagnosis not present

## 2019-01-15 DIAGNOSIS — D509 Iron deficiency anemia, unspecified: Secondary | ICD-10-CM | POA: Diagnosis not present

## 2019-01-15 DIAGNOSIS — N186 End stage renal disease: Secondary | ICD-10-CM | POA: Diagnosis not present

## 2019-01-15 DIAGNOSIS — N2581 Secondary hyperparathyroidism of renal origin: Secondary | ICD-10-CM | POA: Diagnosis not present

## 2019-01-15 DIAGNOSIS — Z992 Dependence on renal dialysis: Secondary | ICD-10-CM | POA: Diagnosis not present

## 2019-01-15 DIAGNOSIS — E119 Type 2 diabetes mellitus without complications: Secondary | ICD-10-CM | POA: Diagnosis not present

## 2019-01-18 DIAGNOSIS — E119 Type 2 diabetes mellitus without complications: Secondary | ICD-10-CM | POA: Diagnosis not present

## 2019-01-18 DIAGNOSIS — N186 End stage renal disease: Secondary | ICD-10-CM | POA: Diagnosis not present

## 2019-01-18 DIAGNOSIS — D631 Anemia in chronic kidney disease: Secondary | ICD-10-CM | POA: Diagnosis not present

## 2019-01-18 DIAGNOSIS — N2581 Secondary hyperparathyroidism of renal origin: Secondary | ICD-10-CM | POA: Diagnosis not present

## 2019-01-18 DIAGNOSIS — Z992 Dependence on renal dialysis: Secondary | ICD-10-CM | POA: Diagnosis not present

## 2019-01-18 DIAGNOSIS — D509 Iron deficiency anemia, unspecified: Secondary | ICD-10-CM | POA: Diagnosis not present

## 2019-01-22 DIAGNOSIS — E119 Type 2 diabetes mellitus without complications: Secondary | ICD-10-CM | POA: Diagnosis not present

## 2019-01-22 DIAGNOSIS — N186 End stage renal disease: Secondary | ICD-10-CM | POA: Diagnosis not present

## 2019-01-22 DIAGNOSIS — Z992 Dependence on renal dialysis: Secondary | ICD-10-CM | POA: Diagnosis not present

## 2019-01-22 DIAGNOSIS — D631 Anemia in chronic kidney disease: Secondary | ICD-10-CM | POA: Diagnosis not present

## 2019-01-22 DIAGNOSIS — D509 Iron deficiency anemia, unspecified: Secondary | ICD-10-CM | POA: Diagnosis not present

## 2019-01-22 DIAGNOSIS — N2581 Secondary hyperparathyroidism of renal origin: Secondary | ICD-10-CM | POA: Diagnosis not present

## 2019-01-25 DIAGNOSIS — Z23 Encounter for immunization: Secondary | ICD-10-CM | POA: Diagnosis not present

## 2019-01-25 DIAGNOSIS — I120 Hypertensive chronic kidney disease with stage 5 chronic kidney disease or end stage renal disease: Secondary | ICD-10-CM | POA: Diagnosis not present

## 2019-01-25 DIAGNOSIS — D631 Anemia in chronic kidney disease: Secondary | ICD-10-CM | POA: Diagnosis not present

## 2019-01-25 DIAGNOSIS — Z992 Dependence on renal dialysis: Secondary | ICD-10-CM | POA: Diagnosis not present

## 2019-01-25 DIAGNOSIS — N186 End stage renal disease: Secondary | ICD-10-CM | POA: Diagnosis not present

## 2019-01-25 DIAGNOSIS — D509 Iron deficiency anemia, unspecified: Secondary | ICD-10-CM | POA: Diagnosis not present

## 2019-01-25 DIAGNOSIS — E119 Type 2 diabetes mellitus without complications: Secondary | ICD-10-CM | POA: Diagnosis not present

## 2019-01-25 DIAGNOSIS — N2581 Secondary hyperparathyroidism of renal origin: Secondary | ICD-10-CM | POA: Diagnosis not present

## 2019-01-27 DIAGNOSIS — N2581 Secondary hyperparathyroidism of renal origin: Secondary | ICD-10-CM | POA: Diagnosis not present

## 2019-01-27 DIAGNOSIS — Z992 Dependence on renal dialysis: Secondary | ICD-10-CM | POA: Diagnosis not present

## 2019-01-27 DIAGNOSIS — Z23 Encounter for immunization: Secondary | ICD-10-CM | POA: Diagnosis not present

## 2019-01-27 DIAGNOSIS — D509 Iron deficiency anemia, unspecified: Secondary | ICD-10-CM | POA: Diagnosis not present

## 2019-01-27 DIAGNOSIS — N186 End stage renal disease: Secondary | ICD-10-CM | POA: Diagnosis not present

## 2019-01-27 DIAGNOSIS — E119 Type 2 diabetes mellitus without complications: Secondary | ICD-10-CM | POA: Diagnosis not present

## 2019-01-29 DIAGNOSIS — D509 Iron deficiency anemia, unspecified: Secondary | ICD-10-CM | POA: Diagnosis not present

## 2019-01-29 DIAGNOSIS — N186 End stage renal disease: Secondary | ICD-10-CM | POA: Diagnosis not present

## 2019-01-29 DIAGNOSIS — Z23 Encounter for immunization: Secondary | ICD-10-CM | POA: Diagnosis not present

## 2019-01-29 DIAGNOSIS — E119 Type 2 diabetes mellitus without complications: Secondary | ICD-10-CM | POA: Diagnosis not present

## 2019-01-29 DIAGNOSIS — Z992 Dependence on renal dialysis: Secondary | ICD-10-CM | POA: Diagnosis not present

## 2019-01-29 DIAGNOSIS — N2581 Secondary hyperparathyroidism of renal origin: Secondary | ICD-10-CM | POA: Diagnosis not present

## 2019-02-01 DIAGNOSIS — N186 End stage renal disease: Secondary | ICD-10-CM | POA: Diagnosis not present

## 2019-02-01 DIAGNOSIS — Z23 Encounter for immunization: Secondary | ICD-10-CM | POA: Diagnosis not present

## 2019-02-01 DIAGNOSIS — Z992 Dependence on renal dialysis: Secondary | ICD-10-CM | POA: Diagnosis not present

## 2019-02-01 DIAGNOSIS — E119 Type 2 diabetes mellitus without complications: Secondary | ICD-10-CM | POA: Diagnosis not present

## 2019-02-01 DIAGNOSIS — D509 Iron deficiency anemia, unspecified: Secondary | ICD-10-CM | POA: Diagnosis not present

## 2019-02-01 DIAGNOSIS — N2581 Secondary hyperparathyroidism of renal origin: Secondary | ICD-10-CM | POA: Diagnosis not present

## 2019-02-03 DIAGNOSIS — N2581 Secondary hyperparathyroidism of renal origin: Secondary | ICD-10-CM | POA: Diagnosis not present

## 2019-02-03 DIAGNOSIS — N186 End stage renal disease: Secondary | ICD-10-CM | POA: Diagnosis not present

## 2019-02-03 DIAGNOSIS — Z992 Dependence on renal dialysis: Secondary | ICD-10-CM | POA: Diagnosis not present

## 2019-02-03 DIAGNOSIS — Z23 Encounter for immunization: Secondary | ICD-10-CM | POA: Diagnosis not present

## 2019-02-03 DIAGNOSIS — E119 Type 2 diabetes mellitus without complications: Secondary | ICD-10-CM | POA: Diagnosis not present

## 2019-02-03 DIAGNOSIS — D509 Iron deficiency anemia, unspecified: Secondary | ICD-10-CM | POA: Diagnosis not present

## 2019-02-08 DIAGNOSIS — N2581 Secondary hyperparathyroidism of renal origin: Secondary | ICD-10-CM | POA: Diagnosis not present

## 2019-02-08 DIAGNOSIS — Z23 Encounter for immunization: Secondary | ICD-10-CM | POA: Diagnosis not present

## 2019-02-08 DIAGNOSIS — D509 Iron deficiency anemia, unspecified: Secondary | ICD-10-CM | POA: Diagnosis not present

## 2019-02-08 DIAGNOSIS — E119 Type 2 diabetes mellitus without complications: Secondary | ICD-10-CM | POA: Diagnosis not present

## 2019-02-08 DIAGNOSIS — Z992 Dependence on renal dialysis: Secondary | ICD-10-CM | POA: Diagnosis not present

## 2019-02-08 DIAGNOSIS — N186 End stage renal disease: Secondary | ICD-10-CM | POA: Diagnosis not present

## 2019-02-10 DIAGNOSIS — N2581 Secondary hyperparathyroidism of renal origin: Secondary | ICD-10-CM | POA: Diagnosis not present

## 2019-02-10 DIAGNOSIS — E119 Type 2 diabetes mellitus without complications: Secondary | ICD-10-CM | POA: Diagnosis not present

## 2019-02-10 DIAGNOSIS — D509 Iron deficiency anemia, unspecified: Secondary | ICD-10-CM | POA: Diagnosis not present

## 2019-02-10 DIAGNOSIS — Z23 Encounter for immunization: Secondary | ICD-10-CM | POA: Diagnosis not present

## 2019-02-10 DIAGNOSIS — Z992 Dependence on renal dialysis: Secondary | ICD-10-CM | POA: Diagnosis not present

## 2019-02-10 DIAGNOSIS — N186 End stage renal disease: Secondary | ICD-10-CM | POA: Diagnosis not present

## 2019-02-12 DIAGNOSIS — Z992 Dependence on renal dialysis: Secondary | ICD-10-CM | POA: Diagnosis not present

## 2019-02-12 DIAGNOSIS — E119 Type 2 diabetes mellitus without complications: Secondary | ICD-10-CM | POA: Diagnosis not present

## 2019-02-12 DIAGNOSIS — Z23 Encounter for immunization: Secondary | ICD-10-CM | POA: Diagnosis not present

## 2019-02-12 DIAGNOSIS — N186 End stage renal disease: Secondary | ICD-10-CM | POA: Diagnosis not present

## 2019-02-12 DIAGNOSIS — D509 Iron deficiency anemia, unspecified: Secondary | ICD-10-CM | POA: Diagnosis not present

## 2019-02-12 DIAGNOSIS — N2581 Secondary hyperparathyroidism of renal origin: Secondary | ICD-10-CM | POA: Diagnosis not present

## 2019-02-15 DIAGNOSIS — E119 Type 2 diabetes mellitus without complications: Secondary | ICD-10-CM | POA: Diagnosis not present

## 2019-02-15 DIAGNOSIS — Z992 Dependence on renal dialysis: Secondary | ICD-10-CM | POA: Diagnosis not present

## 2019-02-15 DIAGNOSIS — N2581 Secondary hyperparathyroidism of renal origin: Secondary | ICD-10-CM | POA: Diagnosis not present

## 2019-02-15 DIAGNOSIS — Z23 Encounter for immunization: Secondary | ICD-10-CM | POA: Diagnosis not present

## 2019-02-15 DIAGNOSIS — N186 End stage renal disease: Secondary | ICD-10-CM | POA: Diagnosis not present

## 2019-02-15 DIAGNOSIS — D509 Iron deficiency anemia, unspecified: Secondary | ICD-10-CM | POA: Diagnosis not present

## 2019-02-17 DIAGNOSIS — N186 End stage renal disease: Secondary | ICD-10-CM | POA: Diagnosis not present

## 2019-02-17 DIAGNOSIS — Z23 Encounter for immunization: Secondary | ICD-10-CM | POA: Diagnosis not present

## 2019-02-17 DIAGNOSIS — Z992 Dependence on renal dialysis: Secondary | ICD-10-CM | POA: Diagnosis not present

## 2019-02-17 DIAGNOSIS — D509 Iron deficiency anemia, unspecified: Secondary | ICD-10-CM | POA: Diagnosis not present

## 2019-02-17 DIAGNOSIS — E119 Type 2 diabetes mellitus without complications: Secondary | ICD-10-CM | POA: Diagnosis not present

## 2019-02-17 DIAGNOSIS — N2581 Secondary hyperparathyroidism of renal origin: Secondary | ICD-10-CM | POA: Diagnosis not present

## 2019-02-19 DIAGNOSIS — N186 End stage renal disease: Secondary | ICD-10-CM | POA: Diagnosis not present

## 2019-02-19 DIAGNOSIS — N2581 Secondary hyperparathyroidism of renal origin: Secondary | ICD-10-CM | POA: Diagnosis not present

## 2019-02-19 DIAGNOSIS — Z23 Encounter for immunization: Secondary | ICD-10-CM | POA: Diagnosis not present

## 2019-02-19 DIAGNOSIS — Z992 Dependence on renal dialysis: Secondary | ICD-10-CM | POA: Diagnosis not present

## 2019-02-19 DIAGNOSIS — D509 Iron deficiency anemia, unspecified: Secondary | ICD-10-CM | POA: Diagnosis not present

## 2019-02-19 DIAGNOSIS — E119 Type 2 diabetes mellitus without complications: Secondary | ICD-10-CM | POA: Diagnosis not present

## 2019-02-22 DIAGNOSIS — Z23 Encounter for immunization: Secondary | ICD-10-CM | POA: Diagnosis not present

## 2019-02-22 DIAGNOSIS — Z992 Dependence on renal dialysis: Secondary | ICD-10-CM | POA: Diagnosis not present

## 2019-02-22 DIAGNOSIS — N186 End stage renal disease: Secondary | ICD-10-CM | POA: Diagnosis not present

## 2019-02-22 DIAGNOSIS — N2581 Secondary hyperparathyroidism of renal origin: Secondary | ICD-10-CM | POA: Diagnosis not present

## 2019-02-22 DIAGNOSIS — D509 Iron deficiency anemia, unspecified: Secondary | ICD-10-CM | POA: Diagnosis not present

## 2019-02-22 DIAGNOSIS — E119 Type 2 diabetes mellitus without complications: Secondary | ICD-10-CM | POA: Diagnosis not present

## 2019-02-24 DIAGNOSIS — Z992 Dependence on renal dialysis: Secondary | ICD-10-CM | POA: Diagnosis not present

## 2019-02-24 DIAGNOSIS — D509 Iron deficiency anemia, unspecified: Secondary | ICD-10-CM | POA: Diagnosis not present

## 2019-02-24 DIAGNOSIS — I120 Hypertensive chronic kidney disease with stage 5 chronic kidney disease or end stage renal disease: Secondary | ICD-10-CM | POA: Diagnosis not present

## 2019-02-24 DIAGNOSIS — E119 Type 2 diabetes mellitus without complications: Secondary | ICD-10-CM | POA: Diagnosis not present

## 2019-02-24 DIAGNOSIS — N2581 Secondary hyperparathyroidism of renal origin: Secondary | ICD-10-CM | POA: Diagnosis not present

## 2019-02-24 DIAGNOSIS — D631 Anemia in chronic kidney disease: Secondary | ICD-10-CM | POA: Diagnosis not present

## 2019-02-24 DIAGNOSIS — N186 End stage renal disease: Secondary | ICD-10-CM | POA: Diagnosis not present

## 2019-03-01 DIAGNOSIS — Z992 Dependence on renal dialysis: Secondary | ICD-10-CM | POA: Diagnosis not present

## 2019-03-01 DIAGNOSIS — D631 Anemia in chronic kidney disease: Secondary | ICD-10-CM | POA: Diagnosis not present

## 2019-03-01 DIAGNOSIS — N2581 Secondary hyperparathyroidism of renal origin: Secondary | ICD-10-CM | POA: Diagnosis not present

## 2019-03-01 DIAGNOSIS — N186 End stage renal disease: Secondary | ICD-10-CM | POA: Diagnosis not present

## 2019-03-01 DIAGNOSIS — D509 Iron deficiency anemia, unspecified: Secondary | ICD-10-CM | POA: Diagnosis not present

## 2019-03-01 DIAGNOSIS — E119 Type 2 diabetes mellitus without complications: Secondary | ICD-10-CM | POA: Diagnosis not present

## 2019-03-02 ENCOUNTER — Encounter: Payer: Self-pay | Admitting: Family

## 2019-03-02 ENCOUNTER — Ambulatory Visit (INDEPENDENT_AMBULATORY_CARE_PROVIDER_SITE_OTHER): Payer: Medicare Other | Admitting: Family

## 2019-03-02 ENCOUNTER — Other Ambulatory Visit: Payer: Self-pay

## 2019-03-02 ENCOUNTER — Other Ambulatory Visit: Payer: Self-pay | Admitting: *Deleted

## 2019-03-02 ENCOUNTER — Encounter: Payer: Self-pay | Admitting: *Deleted

## 2019-03-02 VITALS — BP 193/104 | HR 87 | Temp 97.8°F | Resp 20 | Ht 71.0 in | Wt 274.0 lb

## 2019-03-02 DIAGNOSIS — T82898A Other specified complication of vascular prosthetic devices, implants and grafts, initial encounter: Secondary | ICD-10-CM

## 2019-03-02 DIAGNOSIS — N186 End stage renal disease: Secondary | ICD-10-CM

## 2019-03-02 DIAGNOSIS — Z992 Dependence on renal dialysis: Secondary | ICD-10-CM | POA: Diagnosis not present

## 2019-03-02 NOTE — Progress Notes (Signed)
CC: evaluation of aneurysmal left upper arm AVF, ESRD  History of Present Illness  Joshua [name]. is a 38 y.o. ([birth year]) male who is s/p plication of large left upper arm AV fistula on 05-24-18 by Dr. Scot Dock for large aneurysmal left upper arm fistula.   Pt returns today at the request of Dr. Detterding for evaluation of aneurysmal AVF.  Pt dialyzes on TTS via left upper arm AVF at [facility] on [facility].  Pt states that there are no problems during dialysis, but he does have prolonged bleeding from the cannulation sites after the needles are removed. He is not taking any anticoagulants, no antiplatelet medication except ASA.  He denies any bleeding form the AVF at home.   He is right hand dominant.   Previous accesses have been: none. He has had this left upper arm AVF for about 5 years, and had plications of aneurysms of this AVF previously.   He reports no steal type symptoms in his left upper extremity.    Past Medical History:  Diagnosis Date  . Anemia of chronic disease   . Anxiety   . Arthritis   . Bronchitis    as a child  . Chronic combined systolic and diastolic heart failure ([place])    a.  Echo (12/2012):  Somewhat bright myocardium, moderate to severe LVH, EF 30%, diffuse HK, diastolic dysfunction, mild AI, moderate to severe LAE, moderate to severe RV, moderate to severe reduced RVSF, mild RAE, PASP 38 mm Hg.  . ESRD (end stage renal disease) ([place])    hemodialysis Tues, Thurs, Sat- [address].  . GERD (gastroesophageal reflux disease)   . Headache(784.0)    -no headache since starting dialysis   . Hx of cardiac catheterization    a. Nuclear (04/2013):  Mild inferolateral ischemia, EF 46%;  b. R/L HC (04/2013):  No CAD, RA 5, RV 44/3/8, PA 43/16 (mean 28), PCWP 15  . Hypertension   . Hypertensive cardiovascular disease    Renal Artery [place] (09/2012): No evidence of renal artery stenosis  . NICM (nonischemic cardiomyopathy) ([place])   . Sleep apnea    does not  wear CPAP  . Type II diabetes mellitus ([place])     Social History Social History   Tobacco Use  . Smoking status: Former Smoker    Years: 0.10    Types: Cigarettes    Quit date: 09/24/1999    Years since quitting: 19.4  . Smokeless tobacco: Never Used  Substance Use Topics  . Alcohol use: No    Comment: socially uses  . Drug use: No    Family History Family History  Problem Relation Age of Onset  . Stomach cancer Mother   . Diabetes Father   . Hypertension Father   . Hypertension Maternal Grandmother   . Hypertension Maternal Grandfather   . Kidney disease Maternal Grandfather   . Diabetes Maternal Grandfather     Surgical History Past Surgical History:  Procedure Laterality Date  . AV FISTULA PLACEMENT Left 04/18/2013   Procedure: ARTERIOVENOUS (AV) FISTULA CREATION;  Surgeon: Elam Dutch, MD;  Location: [facility];  Service: Vascular;  Laterality: Left;  . FISTULA SUPERFICIALIZATION Left 08/03/2017   Procedure: FISTULA PLICATION LEFT UPPER ARM ANEURYSM;  Surgeon: Rosetta Posner, MD;  Location: [facility];  Service: Vascular;  Laterality: Left;  . INSERTION OF DIALYSIS CATHETER N/A 05/10/2013   Procedure: INSERTION OF DIALYSIS CATHETER;  Surgeon: Elam Dutch, MD;  Location: [facility];  Service: Vascular;  Laterality: N/A;  . LEFT AND RIGHT HEART CATHETERIZATION WITH CORONARY ANGIOGRAM N/A 05/16/2013   Procedure: LEFT AND RIGHT HEART CATHETERIZATION WITH CORONARY ANGIOGRAM;  Surgeon: Burnell Blanks, MD;  Location: [facility] CATH LAB;  Service: Cardiovascular;  Laterality: N/A;  . REVISON OF ARTERIOVENOUS FISTULA Left 05/24/2018   Procedure: REVISION PLICATION OF ARTERIOVENOUS FISTULA LEFT UPPER ARM;  Surgeon: Angelia Mould, MD;  Location: [facility];  Service: Vascular;  Laterality: Left;    Allergies  Allergen Reactions  . Isordil [Isosorbide Nitrate]     headaches  . Isosorbide Dinitrate Other (See Comments)    Severe headaches with po nitrates (imdur, isordil), do  not prescribe  . Nitrates, Organic Other (See Comments)    Severe headaches with po nitrates (imdur, isordil, etc), do not prescribe  . Carrageenan     Other reaction(s): headache  . Voriconazole     Other reaction(s): headache    Current Outpatient Medications  Medication Sig Dispense Refill  . aspirin 81 MG tablet Take 81 mg by mouth daily.     . calcium acetate (PHOSLO) 667 MG capsule Take 1,334 mg by mouth 3 (three) times daily with meals. Take 1334 mg with each additional snack    . carvedilol (COREG) 12.5 MG tablet Take 25 mg by mouth 2 (two) times daily with a meal.     . cinacalcet (SENSIPAR) 60 MG tablet Take 60 mg by mouth daily.    Marland Kitchen glimepiride (AMARYL) 4 MG tablet Take 8 mg by mouth 2 (two) times daily.     Marland Kitchen JANUVIA 100 MG tablet Take 100 mg by mouth daily.     . multivitamin (RENA-VIT) TABS tablet Take 1 tablet by mouth daily.    Marland Kitchen oxyCODONE-acetaminophen (PERCOCET) 5-325 MG tablet Take 1 tablet by mouth every 6 (six) hours as needed for severe pain. 15 tablet 0  . promethazine (PHENERGAN) 12.5 MG tablet Take 12.5 mg by mouth every 6 (six) hours as needed for nausea or vomiting.     No current facility-administered medications for this visit.      REVIEW OF SYSTEMS: see HPI for pertinent positives and negatives    PHYSICAL EXAMINATION:  Vitals:   03/02/19 0829  BP: (!) 193/104  Pulse: 87  Resp: 20  Temp: 97.8 F (36.6 C)  SpO2: 98%  Weight: 274 lb (124.3 kg)  Height: 5\' 11"  (1.803 m)   Body mass index is 38.22 kg/m.  General: Obese male in NAD.    HEENT:  No gross abnormalities Pulmonary: Respirations are non-labored, fair air movement in all fields, no rales, rhonchi, or wheezes Abdomen: Soft and non-tender with normal bowel sounds. Musculoskeletal: There are no major deformities.   Neurologic: No focal weakness or paresthesias are detected, bilateral hand grip strength is 5/5.  Skin: There are no ulcer or rashes noted. Psychiatric: The patient has  normal affect. Cardiovascular: There is a regular rate and rhythm without significant murmur appreciated.  Bilateral radial pulses are 2+ palpable    Left upper arm AVF. Larger aneurysm distally has thick skin. Proximal aneurysm has thinning skin. Brisk thrill and bruit throughout the AVF.      Medical Decision Making  Joshua [name]. is a 38 y.o. male is s/p plication of large left upper arm AV fistula on 05-24-18 by Dr. Scot Dock for large aneurysmal left upper arm fistula.   The proximal aneurysm has thinning skin, no bleeding issues at home, but does have prolonged bleeding from the cannulation  sites after needles are removed.   Will schedule for plication of the proximal aneurysm of the left upper arm AVF on a non hemodialysis day.     Clemon Chambers, RN, MSN, FNP-C [facility] Office: [phone]  03/02/2019, 8:39 AM  Clinic MD: Laqueta Due

## 2019-03-02 NOTE — H&P (View-Only) (Signed)
CC: evaluation of aneurysmal left upper arm AVF, ESRD  History of Present Illness  Joshua [name]. is a 38 y.o. (1981-05-18) male who is s/p plication of large left upper arm AV fistula on 05-24-18 by Dr. Scot Dock for large aneurysmal left upper arm fistula.   Pt returns today at the request of Dr. Detterding for evaluation of aneurysmal AVF.  Pt dialyzes on TTS via left upper arm AVF at [facility] on [facility].  Pt states that there are no problems during dialysis, but he does have prolonged bleeding from the cannulation sites after the needles are removed. He is not taking any anticoagulants, no antiplatelet medication except ASA.  He denies any bleeding form the AVF at home.   He is right hand dominant.   Previous accesses have been: none. He has had this left upper arm AVF for about 5 years, and had plications of aneurysms of this AVF previously.   He reports no steal type symptoms in his left upper extremity.    Past Medical History:  Diagnosis Date  . Anemia of chronic disease   . Anxiety   . Arthritis   . Bronchitis    as a child  . Chronic combined systolic and diastolic heart failure ([place])    a.  Echo (12/2012):  Somewhat bright myocardium, moderate to severe LVH, EF 30%, diffuse HK, diastolic dysfunction, mild AI, moderate to severe LAE, moderate to severe RV, moderate to severe reduced RVSF, mild RAE, PASP 38 mm Hg.  . ESRD (end stage renal disease) ([place])    hemodialysis Tues, Thurs, Sat- [address].  . GERD (gastroesophageal reflux disease)   . Headache(784.0)    -no headache since starting dialysis   . Hx of cardiac catheterization    a. Nuclear (04/2013):  Mild inferolateral ischemia, EF 46%;  b. R/L HC (04/2013):  No CAD, RA 5, RV 44/3/8, PA 43/16 (mean 28), PCWP 15  . Hypertension   . Hypertensive cardiovascular disease    Renal Artery [place] (09/2012): No evidence of renal artery stenosis  . NICM (nonischemic cardiomyopathy) ([place])   . Sleep apnea    does not  wear CPAP  . Type II diabetes mellitus ([place])     Social History Social History   Tobacco Use  . Smoking status: Former Smoker    Years: 0.10    Types: Cigarettes    Quit date: 09/24/1999    Years since quitting: 19.4  . Smokeless tobacco: Never Used  Substance Use Topics  . Alcohol use: No    Comment: socially uses  . Drug use: No    Family History Family History  Problem Relation Age of Onset  . Stomach cancer Mother   . Diabetes Father   . Hypertension Father   . Hypertension Maternal Grandmother   . Hypertension Maternal Grandfather   . Kidney disease Maternal Grandfather   . Diabetes Maternal Grandfather     Surgical History Past Surgical History:  Procedure Laterality Date  . AV FISTULA PLACEMENT Left 04/18/2013   Procedure: ARTERIOVENOUS (AV) FISTULA CREATION;  Surgeon: Elam Dutch, MD;  Location: [facility];  Service: Vascular;  Laterality: Left;  . FISTULA SUPERFICIALIZATION Left 08/03/2017   Procedure: FISTULA PLICATION LEFT UPPER ARM ANEURYSM;  Surgeon: Rosetta Posner, MD;  Location: [facility];  Service: Vascular;  Laterality: Left;  . INSERTION OF DIALYSIS CATHETER N/A 05/10/2013   Procedure: INSERTION OF DIALYSIS CATHETER;  Surgeon: Elam Dutch, MD;  Location: [facility];  Service: Vascular;  Laterality: N/A;  . LEFT AND RIGHT HEART CATHETERIZATION WITH CORONARY ANGIOGRAM N/A 05/16/2013   Procedure: LEFT AND RIGHT HEART CATHETERIZATION WITH CORONARY ANGIOGRAM;  Surgeon: Burnell Blanks, MD;  Location: [facility] CATH LAB;  Service: Cardiovascular;  Laterality: N/A;  . REVISON OF ARTERIOVENOUS FISTULA Left 05/24/2018   Procedure: REVISION PLICATION OF ARTERIOVENOUS FISTULA LEFT UPPER ARM;  Surgeon: Angelia Mould, MD;  Location: [facility];  Service: Vascular;  Laterality: Left;    Allergies  Allergen Reactions  . Isordil [Isosorbide Nitrate]     headaches  . Isosorbide Dinitrate Other (See Comments)    Severe headaches with po nitrates (imdur, isordil), do  not prescribe  . Nitrates, Organic Other (See Comments)    Severe headaches with po nitrates (imdur, isordil, etc), do not prescribe  . Carrageenan     Other reaction(s): headache  . Voriconazole     Other reaction(s): headache    Current Outpatient Medications  Medication Sig Dispense Refill  . aspirin 81 MG tablet Take 81 mg by mouth daily.     . calcium acetate (PHOSLO) 667 MG capsule Take 1,334 mg by mouth 3 (three) times daily with meals. Take 1334 mg with each additional snack    . carvedilol (COREG) 12.5 MG tablet Take 25 mg by mouth 2 (two) times daily with a meal.     . cinacalcet (SENSIPAR) 60 MG tablet Take 60 mg by mouth daily.    Marland Kitchen glimepiride (AMARYL) 4 MG tablet Take 8 mg by mouth 2 (two) times daily.     Marland Kitchen JANUVIA 100 MG tablet Take 100 mg by mouth daily.     . multivitamin (RENA-VIT) TABS tablet Take 1 tablet by mouth daily.    Marland Kitchen oxyCODONE-acetaminophen (PERCOCET) 5-325 MG tablet Take 1 tablet by mouth every 6 (six) hours as needed for severe pain. 15 tablet 0  . promethazine (PHENERGAN) 12.5 MG tablet Take 12.5 mg by mouth every 6 (six) hours as needed for nausea or vomiting.     No current facility-administered medications for this visit.      REVIEW OF SYSTEMS: see HPI for pertinent positives and negatives    PHYSICAL EXAMINATION:  Vitals:   03/02/19 0829  BP: (!) 193/104  Pulse: 87  Resp: 20  Temp: 97.8 F (36.6 C)  SpO2: 98%  Weight: 274 lb (124.3 kg)  Height: 5\' 11"  (1.803 m)   Body mass index is 38.22 kg/m.  General: Obese male in NAD.    HEENT:  No gross abnormalities Pulmonary: Respirations are non-labored, fair air movement in all fields, no rales, rhonchi, or wheezes Abdomen: Soft and non-tender with normal bowel sounds. Musculoskeletal: There are no major deformities.   Neurologic: No focal weakness or paresthesias are detected, bilateral hand grip strength is 5/5.  Skin: There are no ulcer or rashes noted. Psychiatric: The patient has  normal affect. Cardiovascular: There is a regular rate and rhythm without significant murmur appreciated.  Bilateral radial pulses are 2+ palpable    Left upper arm AVF. Larger aneurysm distally has thick skin. Proximal aneurysm has thinning skin. Brisk thrill and bruit throughout the AVF.      Medical Decision Making  Kode Hemmen. is a 38 y.o. male is s/p plication of large left upper arm AV fistula on 05-24-18 by Dr. Scot Dock for large aneurysmal left upper arm fistula.   The proximal aneurysm has thinning skin, no bleeding issues at home, but does have prolonged bleeding from the cannulation  sites after needles are removed.   Will schedule for plication of the proximal aneurysm of the left upper arm AVF on a non hemodialysis day.     Clemon Chambers, RN, MSN, FNP-C [facility] Office: [phone]  03/02/2019, 8:39 AM  Clinic MD: Laqueta Due

## 2019-03-03 DIAGNOSIS — D509 Iron deficiency anemia, unspecified: Secondary | ICD-10-CM | POA: Diagnosis not present

## 2019-03-03 DIAGNOSIS — N2581 Secondary hyperparathyroidism of renal origin: Secondary | ICD-10-CM | POA: Diagnosis not present

## 2019-03-03 DIAGNOSIS — D631 Anemia in chronic kidney disease: Secondary | ICD-10-CM | POA: Diagnosis not present

## 2019-03-03 DIAGNOSIS — E119 Type 2 diabetes mellitus without complications: Secondary | ICD-10-CM | POA: Diagnosis not present

## 2019-03-03 DIAGNOSIS — N186 End stage renal disease: Secondary | ICD-10-CM | POA: Diagnosis not present

## 2019-03-03 DIAGNOSIS — Z992 Dependence on renal dialysis: Secondary | ICD-10-CM | POA: Diagnosis not present

## 2019-03-05 DIAGNOSIS — D509 Iron deficiency anemia, unspecified: Secondary | ICD-10-CM | POA: Diagnosis not present

## 2019-03-05 DIAGNOSIS — N186 End stage renal disease: Secondary | ICD-10-CM | POA: Diagnosis not present

## 2019-03-05 DIAGNOSIS — N2581 Secondary hyperparathyroidism of renal origin: Secondary | ICD-10-CM | POA: Diagnosis not present

## 2019-03-05 DIAGNOSIS — Z992 Dependence on renal dialysis: Secondary | ICD-10-CM | POA: Diagnosis not present

## 2019-03-05 DIAGNOSIS — D631 Anemia in chronic kidney disease: Secondary | ICD-10-CM | POA: Diagnosis not present

## 2019-03-05 DIAGNOSIS — E119 Type 2 diabetes mellitus without complications: Secondary | ICD-10-CM | POA: Diagnosis not present

## 2019-03-10 DIAGNOSIS — Z992 Dependence on renal dialysis: Secondary | ICD-10-CM | POA: Diagnosis not present

## 2019-03-10 DIAGNOSIS — D509 Iron deficiency anemia, unspecified: Secondary | ICD-10-CM | POA: Diagnosis not present

## 2019-03-10 DIAGNOSIS — N2581 Secondary hyperparathyroidism of renal origin: Secondary | ICD-10-CM | POA: Diagnosis not present

## 2019-03-10 DIAGNOSIS — E1129 Type 2 diabetes mellitus with other diabetic kidney complication: Secondary | ICD-10-CM | POA: Diagnosis not present

## 2019-03-10 DIAGNOSIS — D631 Anemia in chronic kidney disease: Secondary | ICD-10-CM | POA: Diagnosis not present

## 2019-03-10 DIAGNOSIS — E119 Type 2 diabetes mellitus without complications: Secondary | ICD-10-CM | POA: Diagnosis not present

## 2019-03-10 DIAGNOSIS — N186 End stage renal disease: Secondary | ICD-10-CM | POA: Diagnosis not present

## 2019-03-11 ENCOUNTER — Other Ambulatory Visit (HOSPITAL_COMMUNITY)
Admission: RE | Admit: 2019-03-11 | Discharge: 2019-03-11 | Disposition: A | Discharge status: Home / Self Care | Payer: Medicare Other | Source: Ambulatory Visit | Attending: Vascular Surgery | Admitting: Vascular Surgery

## 2019-03-11 ENCOUNTER — Encounter (HOSPITAL_COMMUNITY): Payer: Self-pay | Admitting: *Deleted

## 2019-03-11 DIAGNOSIS — Z20828 Contact with and (suspected) exposure to other viral communicable diseases: Secondary | ICD-10-CM | POA: Insufficient documentation

## 2019-03-11 DIAGNOSIS — Z01812 Encounter for preprocedural laboratory examination: Secondary | ICD-10-CM | POA: Insufficient documentation

## 2019-03-11 MED ORDER — DEXTROSE 5 % IV SOLN
3.0000 g | INTRAVENOUS | Status: AC
  Administered 2019-03-14: 3 g via INTRAVENOUS
  Filled 2019-03-11: qty 3

## 2019-03-11 NOTE — Progress Notes (Addendum)
Anesthesia Chart Review: Same day workup  Hx of NICM thought due to hard to control HTN. Last seen by Dr. Gwenlyn Found 07/03/17. At that time repeat echo was ordered and showed EF 40-45% which was improved from previous. He was cleared to undergo fistula plication which he had on [id number] without complication.   ESRD on HD. Per notes, his HTN is managed by nephrologist, Dr. Jimmy Footman.   Will need DOS labs   Echo 07/13/17:  - Left ventricle: The cavity size was mildly dilated. There wasmoderate concentric hypertrophy. Systolic function was mildly tomoderately reduced. The estimated ejection fraction was in therange of 40% to 45%. Mild diffuse hypokinesis with noidentifiable regional variations. Features are consistent with apseudonormal left ventricular filling pattern, with concomitantabnormal relaxation and increased filling pressure (grade 2diastolic dysfunction). - Left atrium: The atrium was moderately to severely dilated. - Impressions: Compared to 2014, left ventricular systolic function has improved.  Cardiac cath 05/16/13: 1. No angiographic evidence of CAD 2. Non-ischemic cardiomyopathy 3. Mild pulmonary HTN    Wynonia Musty [facility] Short Stay Center/Anesthesiology Phone [phone] 03/11/2019 1:26 PM

## 2019-03-11 NOTE — Progress Notes (Signed)
Patient denies shortness of breath, fever, cough and chest pain.  PCP - Dan Maker Cardiologist - Dr Minus Breeding Nephrology- Dr Jeneen Rinks Deterding  Chest x-ray - 09/04/18, 1 view EKG - 09/06/18 Stress Test - 05/15/13 ECHO - 07/13/17 Cardiac Cath - 05/16/13  Sleep Study - Yes CPAP - Does not wear CPAP  Fasting Blood Sugar - 130-150s Checks Blood Sugar 1 times a day  . Do not take oral diabetes medicines (Januvia, Glimepiride) the morning of surgery.  Do not take Sunday evening dose of Glimepiride.  . If your blood sugar is less than 70 mg/dL, you will need to treat for low blood sugar: o Treat a low blood sugar (less than 70 mg/dL) with  cup of clear juice (cranberry or apple),  o Recheck blood sugar in 15 minutes after treatment (to make sure it is greater than 70 mg/dL). If your blood sugar is not greater than 70 mg/dL on recheck, call [phone] for further instructions.   Aspirin Instructions: Take aspirin on DOS per MD.  Anesthesia review:  Yes   STOP now taking any Aleve, Naproxen, Ibuprofen, Motrin, Advil, Goody's, BC's, all herbal medications, fish oil, and all vitamins.   Coronavirus Screening Have you experienced the following symptoms:  Cough yes/no: No Fever (>100.31F)  yes/no: No Runny nose yes/no: No Sore throat yes/no: No Difficulty breathing/shortness of breath  yes/no: No  Have you traveled in the last 14 days and where? yes/no: No  Patient verbalized understanding of instructions that were given to him via phone.

## 2019-03-11 NOTE — Anesthesia Preprocedure Evaluation (Addendum)
Anesthesia Evaluation  Patient identified by MRN, date of birth, ID band Patient awake    Reviewed: Allergy & Precautions, NPO status , Patient's Chart, lab work & pertinent test results  Airway Mallampati: I  TM Distance: >3 FB Neck ROM: Full    Dental   Pulmonary former smoker,    Pulmonary exam normal        Cardiovascular hypertension, Pt. on medications Normal cardiovascular exam     Neuro/Psych Anxiety    GI/Hepatic GERD  Medicated and Controlled,  Endo/Other  diabetes, Type 2, Insulin Dependent  Renal/GU ESRF and DialysisRenal disease     Musculoskeletal   Abdominal   Peds  Hematology   Anesthesia Other Findings   Reproductive/Obstetrics                            Anesthesia Physical Anesthesia Plan  ASA: III  Anesthesia Plan: General   Post-op Pain Management:    Induction: Intravenous  PONV Risk Score and Plan: 2 and Ondansetron and Midazolam  Airway Management Planned: LMA  Additional Equipment:   Intra-op Plan:   Post-operative Plan: Extubation in OR  Informed Consent: I have reviewed the patients History and Physical, chart, labs and discussed the procedure including the risks, benefits and alternatives for the proposed anesthesia with the patient or authorized representative who has indicated his/her understanding and acceptance.       Plan Discussed with: CRNA and Surgeon  Anesthesia Plan Comments: ( Hx of NICM thought due to hard to control HTN. Last seen by Dr. Gwenlyn Found 07/03/17. At that time repeat echo was ordered and showed EF 40-45% which was improved from previous. He was cleared to undergo fistula plication which he had on [id number] without complication.   ESRD on HD. Per notes, his HTN is managed by nephrologist, Dr. Jimmy Footman.   Will need DOS labs  Echo 07/13/17:  - Left ventricle: The cavity size was mildly dilated. There wasmoderate concentric  hypertrophy. Systolic function was mildly tomoderately reduced. The estimated ejection fraction was in therange of 40% to 45%. Mild diffuse hypokinesis with noidentifiable regional variations. Features are consistent with apseudonormal left ventricular filling pattern, with concomitantabnormal relaxation and increased filling pressure (grade 2diastolic dysfunction). - Left atrium: The atrium was moderately to severely dilated. - Impressions: Compared to 2014, left ventricular systolic function has improved.  Cardiac cath 05/16/13: 1. No angiographic evidence of CAD 2. Non-ischemic cardiomyopathy 3. Mild pulmonary HTN )      Anesthesia Quick Evaluation

## 2019-03-12 DIAGNOSIS — E119 Type 2 diabetes mellitus without complications: Secondary | ICD-10-CM | POA: Diagnosis not present

## 2019-03-12 DIAGNOSIS — D631 Anemia in chronic kidney disease: Secondary | ICD-10-CM | POA: Diagnosis not present

## 2019-03-12 DIAGNOSIS — N2581 Secondary hyperparathyroidism of renal origin: Secondary | ICD-10-CM | POA: Diagnosis not present

## 2019-03-12 DIAGNOSIS — N186 End stage renal disease: Secondary | ICD-10-CM | POA: Diagnosis not present

## 2019-03-12 DIAGNOSIS — D509 Iron deficiency anemia, unspecified: Secondary | ICD-10-CM | POA: Diagnosis not present

## 2019-03-12 DIAGNOSIS — Z992 Dependence on renal dialysis: Secondary | ICD-10-CM | POA: Diagnosis not present

## 2019-03-12 LAB — NOVEL CORONAVIRUS, NAA (HOSP ORDER, SEND-OUT TO REF LAB; TAT 18-24 HRS): SARS-CoV-2, NAA: NOT DETECTED

## 2019-03-14 ENCOUNTER — Ambulatory Visit (HOSPITAL_COMMUNITY)
Admission: RE | Admit: 2019-03-14 | Discharge: 2019-03-14 | Disposition: A | Discharge status: Home / Self Care | Payer: Medicare Other | Attending: Vascular Surgery | Admitting: Vascular Surgery

## 2019-03-14 ENCOUNTER — Ambulatory Visit (HOSPITAL_COMMUNITY): Payer: Medicare Other | Admitting: Physician Assistant

## 2019-03-14 ENCOUNTER — Encounter (HOSPITAL_COMMUNITY)
Admission: RE | Disposition: A | Discharge status: Home / Self Care | Payer: Self-pay | Source: Home / Self Care | Attending: Vascular Surgery

## 2019-03-14 ENCOUNTER — Encounter (HOSPITAL_COMMUNITY): Payer: Self-pay | Admitting: Certified Registered Nurse Anesthetist

## 2019-03-14 ENCOUNTER — Other Ambulatory Visit: Payer: Self-pay

## 2019-03-14 DIAGNOSIS — Z79899 Other long term (current) drug therapy: Secondary | ICD-10-CM | POA: Diagnosis not present

## 2019-03-14 DIAGNOSIS — I5043 Acute on chronic combined systolic (congestive) and diastolic (congestive) heart failure: Secondary | ICD-10-CM | POA: Diagnosis not present

## 2019-03-14 DIAGNOSIS — I5042 Chronic combined systolic (congestive) and diastolic (congestive) heart failure: Secondary | ICD-10-CM | POA: Insufficient documentation

## 2019-03-14 DIAGNOSIS — I428 Other cardiomyopathies: Secondary | ICD-10-CM | POA: Diagnosis not present

## 2019-03-14 DIAGNOSIS — E1122 Type 2 diabetes mellitus with diabetic chronic kidney disease: Secondary | ICD-10-CM | POA: Insufficient documentation

## 2019-03-14 DIAGNOSIS — N186 End stage renal disease: Secondary | ICD-10-CM | POA: Insufficient documentation

## 2019-03-14 DIAGNOSIS — D631 Anemia in chronic kidney disease: Secondary | ICD-10-CM | POA: Insufficient documentation

## 2019-03-14 DIAGNOSIS — Y832 Surgical operation with anastomosis, bypass or graft as the cause of abnormal reaction of the patient, or of later complication, without mention of misadventure at the time of the procedure: Secondary | ICD-10-CM | POA: Diagnosis not present

## 2019-03-14 DIAGNOSIS — Z992 Dependence on renal dialysis: Secondary | ICD-10-CM | POA: Diagnosis not present

## 2019-03-14 DIAGNOSIS — Z87891 Personal history of nicotine dependence: Secondary | ICD-10-CM | POA: Insufficient documentation

## 2019-03-14 DIAGNOSIS — T82898A Other specified complication of vascular prosthetic devices, implants and grafts, initial encounter: Secondary | ICD-10-CM | POA: Insufficient documentation

## 2019-03-14 DIAGNOSIS — Z7982 Long term (current) use of aspirin: Secondary | ICD-10-CM | POA: Diagnosis not present

## 2019-03-14 DIAGNOSIS — G4730 Sleep apnea, unspecified: Secondary | ICD-10-CM | POA: Diagnosis not present

## 2019-03-14 DIAGNOSIS — I132 Hypertensive heart and chronic kidney disease with heart failure and with stage 5 chronic kidney disease, or end stage renal disease: Secondary | ICD-10-CM | POA: Diagnosis not present

## 2019-03-14 DIAGNOSIS — Z7984 Long term (current) use of oral hypoglycemic drugs: Secondary | ICD-10-CM | POA: Diagnosis not present

## 2019-03-14 DIAGNOSIS — K219 Gastro-esophageal reflux disease without esophagitis: Secondary | ICD-10-CM | POA: Diagnosis not present

## 2019-03-14 HISTORY — PX: REVISION OF ARTERIOVENOUS GORETEX GRAFT: SHX6073

## 2019-03-14 LAB — GLUCOSE, CAPILLARY
Glucose-Capillary: 166 mg/dL — ABNORMAL HIGH (ref 70–99)
Glucose-Capillary: 215 mg/dL — ABNORMAL HIGH (ref 70–99)

## 2019-03-14 LAB — POCT I-STAT, CHEM 8
BUN: 47 mg/dL — ABNORMAL HIGH (ref 6–20)
Calcium, Ion: 1.08 mmol/L — ABNORMAL LOW (ref 1.15–1.40)
Chloride: 95 mmol/L — ABNORMAL LOW (ref 98–111)
Creatinine, Ser: 12 mg/dL — ABNORMAL HIGH (ref 0.61–1.24)
Glucose, Bld: 160 mg/dL — ABNORMAL HIGH (ref 70–99)
HCT: 33 % — ABNORMAL LOW (ref 39.0–52.0)
Hemoglobin: 11.2 g/dL — ABNORMAL LOW (ref 13.0–17.0)
Potassium: 4.1 mmol/L (ref 3.5–5.1)
Sodium: 138 mmol/L (ref 135–145)
TCO2: 30 mmol/L (ref 22–32)

## 2019-03-14 SURGERY — REVISION OF ARTERIOVENOUS GORETEX GRAFT
Anesthesia: General | Site: Arm Upper | Laterality: Left

## 2019-03-14 MED ORDER — SODIUM CHLORIDE 0.9 % IV SOLN
INTRAVENOUS | Status: DC | PRN
  Administered 2019-03-14: 25 ug/min via INTRAVENOUS
  Administered 2019-03-14: 50 ug/min via INTRAVENOUS

## 2019-03-14 MED ORDER — ONDANSETRON HCL 4 MG/2ML IJ SOLN
INTRAMUSCULAR | Status: AC
  Filled 2019-03-14: qty 2

## 2019-03-14 MED ORDER — LIDOCAINE-EPINEPHRINE (PF) 1 %-1:200000 IJ SOLN
INTRAMUSCULAR | Status: AC
  Filled 2019-03-14: qty 30

## 2019-03-14 MED ORDER — VASOPRESSIN 20 UNIT/ML IV SOLN
INTRAVENOUS | Status: AC
  Filled 2019-03-14: qty 1

## 2019-03-14 MED ORDER — DEXAMETHASONE SODIUM PHOSPHATE 10 MG/ML IJ SOLN
INTRAMUSCULAR | Status: AC
  Filled 2019-03-14: qty 1

## 2019-03-14 MED ORDER — LIDOCAINE 2% (20 MG/ML) 5 ML SYRINGE
INTRAMUSCULAR | Status: DC | PRN
  Administered 2019-03-14: 100 mg via INTRAVENOUS

## 2019-03-14 MED ORDER — LIDOCAINE 2% (20 MG/ML) 5 ML SYRINGE
INTRAMUSCULAR | Status: AC
  Filled 2019-03-14: qty 5

## 2019-03-14 MED ORDER — EPHEDRINE SULFATE-NACL 50-0.9 MG/10ML-% IV SOSY
PREFILLED_SYRINGE | INTRAVENOUS | Status: DC | PRN
  Administered 2019-03-14 (×5): 10 mg via INTRAVENOUS

## 2019-03-14 MED ORDER — OXYCODONE HCL 5 MG PO TABS: 5.0000 mg | ORAL_TABLET | ORAL | 0 refills | Status: AC | PRN

## 2019-03-14 MED ORDER — MIDAZOLAM HCL 2 MG/2ML IJ SOLN
INTRAMUSCULAR | Status: AC
  Filled 2019-03-14: qty 2

## 2019-03-14 MED ORDER — MIDAZOLAM HCL 5 MG/5ML IJ SOLN
INTRAMUSCULAR | Status: DC | PRN
  Administered 2019-03-14: 2 mg via INTRAVENOUS

## 2019-03-14 MED ORDER — SODIUM CHLORIDE 0.9 % IV SOLN
INTRAVENOUS | Status: AC
  Filled 2019-03-14: qty 1.2

## 2019-03-14 MED ORDER — PROPOFOL 10 MG/ML IV BOLUS
INTRAVENOUS | Status: DC | PRN
  Administered 2019-03-14: 50 mg via INTRAVENOUS
  Administered 2019-03-14: 150 mg via INTRAVENOUS

## 2019-03-14 MED ORDER — CHLORHEXIDINE GLUCONATE 4 % EX LIQD: 60.0000 mL | Freq: Once | CUTANEOUS | Status: DC

## 2019-03-14 MED ORDER — EPHEDRINE 5 MG/ML INJ
INTRAVENOUS | Status: AC
  Filled 2019-03-14: qty 10

## 2019-03-14 MED ORDER — DEXAMETHASONE SODIUM PHOSPHATE 10 MG/ML IJ SOLN
INTRAMUSCULAR | Status: DC | PRN
  Administered 2019-03-14: 5 mg via INTRAVENOUS

## 2019-03-14 MED ORDER — FENTANYL CITRATE (PF) 100 MCG/2ML IJ SOLN
INTRAMUSCULAR | Status: DC | PRN
  Administered 2019-03-14 (×4): 25 ug via INTRAVENOUS
  Administered 2019-03-14 (×3): 50 ug via INTRAVENOUS

## 2019-03-14 MED ORDER — HEPARIN SODIUM (PORCINE) 1000 UNIT/ML IJ SOLN
INTRAMUSCULAR | Status: DC | PRN
  Administered 2019-03-14: 10000 [IU] via INTRAVENOUS

## 2019-03-14 MED ORDER — ONDANSETRON HCL 4 MG/2ML IJ SOLN
INTRAMUSCULAR | Status: DC | PRN
  Administered 2019-03-14: 4 mg via INTRAVENOUS

## 2019-03-14 MED ORDER — SODIUM CHLORIDE 0.9 % IV SOLN
INTRAVENOUS | Status: DC | PRN
  Administered 2019-03-14: 500 mL

## 2019-03-14 MED ORDER — FENTANYL CITRATE (PF) 250 MCG/5ML IJ SOLN
INTRAMUSCULAR | Status: AC
  Filled 2019-03-14: qty 5

## 2019-03-14 MED ORDER — PHENYLEPHRINE 40 MCG/ML (10ML) SYRINGE FOR IV PUSH (FOR BLOOD PRESSURE SUPPORT)
PREFILLED_SYRINGE | INTRAVENOUS | Status: DC | PRN
  Administered 2019-03-14 (×2): 80 ug via INTRAVENOUS
  Administered 2019-03-14: 120 ug via INTRAVENOUS
  Administered 2019-03-14 (×5): 80 ug via INTRAVENOUS

## 2019-03-14 MED ORDER — PROTAMINE SULFATE 10 MG/ML IV SOLN
INTRAVENOUS | Status: DC | PRN
  Administered 2019-03-14: 50 mg via INTRAVENOUS

## 2019-03-14 MED ORDER — LIDOCAINE-EPINEPHRINE (PF) 1 %-1:200000 IJ SOLN
INTRAMUSCULAR | Status: DC | PRN
  Administered 2019-03-14: 30 mL

## 2019-03-14 MED ORDER — 0.9 % SODIUM CHLORIDE (POUR BTL) OPTIME
TOPICAL | Status: DC | PRN
  Administered 2019-03-14: 10:00:00 1000 mL

## 2019-03-14 MED ORDER — PHENYLEPHRINE 40 MCG/ML (10ML) SYRINGE FOR IV PUSH (FOR BLOOD PRESSURE SUPPORT)
PREFILLED_SYRINGE | INTRAVENOUS | Status: AC
  Filled 2019-03-14: qty 10

## 2019-03-14 MED ORDER — SODIUM CHLORIDE 0.9 % IV SOLN
INTRAVENOUS | Status: DC
  Administered 2019-03-14 (×2): via INTRAVENOUS

## 2019-03-14 SURGICAL SUPPLY — 36 items
CANISTER SUCT 3000ML PPV (MISCELLANEOUS) ×3 IMPLANT
CANNULA VESSEL 3MM 2 BLNT TIP (CANNULA) ×3 IMPLANT
CLIP VESOCCLUDE MED 6/CT (CLIP) ×3 IMPLANT
CLIP VESOCCLUDE SM WIDE 6/CT (CLIP) ×3 IMPLANT
COVER WAND RF STERILE (DRAPES) ×3 IMPLANT
DECANTER SPIKE VIAL GLASS SM (MISCELLANEOUS) ×3 IMPLANT
DERMABOND ADVANCED (GAUZE/BANDAGES/DRESSINGS) ×4
DERMABOND ADVANCED .7 DNX12 (GAUZE/BANDAGES/DRESSINGS) ×2 IMPLANT
DRAPE INCISE IOBAN 66X45 STRL (DRAPES) IMPLANT
ELECT REM PT RETURN 9FT ADLT (ELECTROSURGICAL) ×3
ELECTRODE REM PT RTRN 9FT ADLT (ELECTROSURGICAL) ×1 IMPLANT
GAUZE 4X4 16PLY RFD (DISPOSABLE) ×3 IMPLANT
GLOVE BIO SURGEON STRL SZ 6.5 (GLOVE) ×4 IMPLANT
GLOVE BIO SURGEON STRL SZ7.5 (GLOVE) ×3 IMPLANT
GLOVE BIO SURGEONS STRL SZ 6.5 (GLOVE) ×2
GLOVE BIOGEL PI IND STRL 6.5 (GLOVE) ×3 IMPLANT
GLOVE BIOGEL PI IND STRL 8 (GLOVE) ×1 IMPLANT
GLOVE BIOGEL PI INDICATOR 6.5 (GLOVE) ×6
GLOVE BIOGEL PI INDICATOR 8 (GLOVE) ×2
GOWN STRL REUS W/ TWL LRG LVL3 (GOWN DISPOSABLE) ×3 IMPLANT
GOWN STRL REUS W/TWL LRG LVL3 (GOWN DISPOSABLE) ×6
KIT BASIN OR (CUSTOM PROCEDURE TRAY) ×3 IMPLANT
KIT TURNOVER KIT B (KITS) ×3 IMPLANT
NEEDLE HYPO 25GX1X1/2 BEV (NEEDLE) ×3 IMPLANT
NS IRRIG 1000ML POUR BTL (IV SOLUTION) ×3 IMPLANT
PACK CV ACCESS (CUSTOM PROCEDURE TRAY) ×3 IMPLANT
PAD ARMBOARD 7.5X6 YLW CONV (MISCELLANEOUS) ×6 IMPLANT
SPONGE SURGIFOAM ABS GEL 100 (HEMOSTASIS) IMPLANT
SUT PROLENE 5 0 C 1 24 (SUTURE) ×3 IMPLANT
SUT PROLENE 6 0 BV (SUTURE) ×3 IMPLANT
SUT VIC AB 3-0 SH 27 (SUTURE) ×2
SUT VIC AB 3-0 SH 27X BRD (SUTURE) ×1 IMPLANT
SUT VICRYL 4-0 PS2 18IN ABS (SUTURE) ×3 IMPLANT
TOWEL GREEN STERILE (TOWEL DISPOSABLE) ×3 IMPLANT
UNDERPAD 30X30 (UNDERPADS AND DIAPERS) ×3 IMPLANT
WATER STERILE IRR 1000ML POUR (IV SOLUTION) ×3 IMPLANT

## 2019-03-14 NOTE — Op Note (Signed)
    NAME: Joshua [name].    MRN: [id number] DOB: October 26, 1980    DATE OF OPERATION: 03/14/2019  PREOP DIAGNOSIS:    Aneurysmal left upper arm fistula with overlying skin ulceration  POSTOP DIAGNOSIS:    Same  PROCEDURE:    Excision of large aneurysm of left upper arm fistula  SURGEON: Judeth Cornfield. Scot Dock, MD, FACS  ASSIST: Ellsworth Lennox ,RNFA  ANESTHESIA: General  EBL: Minimal  INDICATIONS:    Joshua [name]. is a 38 y.o. male with a large aneurysmal left upper arm fistula.  He developed an ulceration over the more central aneurysm and was set up for plication  FINDINGS:   The aneurysmal segment was significantly tortuous and I felt the only way to revise this was to excise the redundant segment and so the vein back into end.  TECHNIQUE:   The patient was taken to the operating room and received general anesthetic.  The left arm was prepped and draped in usual sterile fashion.  A long elliptical incision was made overlying this large aneurysm.  I dissected free the aneurysm circumferentially over large length.  It was significantly tortuous and there was really no way to plicate this.  Elected to excise the redundant segment and so the vein back in the end with running 5-0 Prolene suture.  After this was fully mobilized the patient was heparinized.  The fistula was clamped proximally and distally.  An aneurysmal segment was excised.  The vein was then sewn back into and with running 5-0 Prolene suture.  The completion was a good thrill in the fistula.  Hemostasis was obtained in the wound.  The wound was closed with 2 deep layers of 3-0 Vicryl and the skin closed with 4-0 Vicryl.  Dermabond was applied.  The patient tolerated procedure well was transferred to the recovery room in stable condition.  All needle and sponge counts were correct.  Joshua Mayo, MD, FACS [facility]  DATE OF DICTATION:   03/14/2019

## 2019-03-14 NOTE — Anesthesia Postprocedure Evaluation (Signed)
Anesthesia Post Note  Patient: Joshua [name].  Procedure(s) Performed: PLICATION OF ANEURYSM AT PROXIMAL SEGMENT OF ARTERIOVENOUS FISTULA LEFT ARM (Left Arm Upper)     Patient location during evaluation: PACU Anesthesia Type: General Level of consciousness: awake and alert Pain management: pain level controlled Vital Signs Assessment: post-procedure vital signs reviewed and stable Respiratory status: spontaneous breathing, nonlabored ventilation, respiratory function stable and patient connected to nasal cannula oxygen Cardiovascular status: blood pressure returned to baseline and stable Postop Assessment: no apparent nausea or vomiting Anesthetic complications: no    Last Vitals:  Vitals:   03/14/19 1230 03/14/19 1233  BP:  (!) 170/102  Pulse:  85  Resp: 18 (!) 25  Temp:  (!) 36.1 C  SpO2:  96%    Last Pain:  Vitals:   03/14/19 1233  TempSrc:   PainSc: 0-No pain                 Daimen Shovlin DAVID

## 2019-03-14 NOTE — Anesthesia Procedure Notes (Signed)
Procedure Name: LMA Insertion Date/Time: 03/14/2019 9:26 AM Performed by: Genelle Bal, CRNA Pre-anesthesia Checklist: Patient identified, Emergency Drugs available, Suction available and Patient being monitored Patient Re-evaluated:Patient Re-evaluated prior to induction Oxygen Delivery Method: Circle system utilized Preoxygenation: Pre-oxygenation with 100% oxygen Induction Type: IV induction Ventilation: Mask ventilation without difficulty LMA: LMA inserted LMA Size: 5.0 Number of attempts: 1 Airway Equipment and Method: Bite block Placement Confirmation: positive ETCO2 Tube secured with: Tape Dental Injury: Teeth and Oropharynx as per pre-operative assessment

## 2019-03-14 NOTE — Transfer of Care (Signed)
Immediate Anesthesia Transfer of Care Note  Patient: Joshua [name].  Procedure(s) Performed: PLICATION OF ANEURYSM AT PROXIMAL SEGMENT OF ARTERIOVENOUS FISTULA LEFT ARM (Left Arm Upper)  Patient Location: PACU  Anesthesia Type:General  Level of Consciousness: drowsy and patient cooperative  Airway & Oxygen Therapy: Patient Spontanous Breathing and Patient connected to face mask oxygen  Post-op Assessment: Report given to RN and Post -op Vital signs reviewed and stable  Post vital signs: Reviewed and stable  Last Vitals:  Vitals Value Taken Time  BP 156/78   Temp    Pulse 86 03/14/19 1134  Resp 17 03/14/19 1134  SpO2 97 % 03/14/19 1134  Vitals shown include unvalidated device data.  Last Pain:  Vitals:   03/14/19 0813  TempSrc: Axillary         Complications: No apparent anesthesia complications

## 2019-03-14 NOTE — Interval H&P Note (Signed)
History and Physical Interval Note:  03/14/2019 8:47 AM  Joshua [name].  has presented today for surgery, with the diagnosis of ANEURYSM PROXIMAL SEGMENT OF ARTERIOVENOUS FISTULA LEFT ARM.  The various methods of treatment have been discussed with the patient and family. After consideration of risks, benefits and other options for treatment, the patient has consented to  Procedure(s): [place] (Left) as a surgical intervention.  The patient's history has been reviewed, patient examined, no change in status, stable for surgery.  I have reviewed the patient's chart and labs.  Questions were answered to the patient's satisfaction.     [name]

## 2019-03-15 ENCOUNTER — Encounter (HOSPITAL_COMMUNITY): Payer: Self-pay | Admitting: Vascular Surgery

## 2019-03-15 DIAGNOSIS — N2581 Secondary hyperparathyroidism of renal origin: Secondary | ICD-10-CM | POA: Diagnosis not present

## 2019-03-15 DIAGNOSIS — D509 Iron deficiency anemia, unspecified: Secondary | ICD-10-CM | POA: Diagnosis not present

## 2019-03-15 DIAGNOSIS — E119 Type 2 diabetes mellitus without complications: Secondary | ICD-10-CM | POA: Diagnosis not present

## 2019-03-15 DIAGNOSIS — D631 Anemia in chronic kidney disease: Secondary | ICD-10-CM | POA: Diagnosis not present

## 2019-03-15 DIAGNOSIS — Z992 Dependence on renal dialysis: Secondary | ICD-10-CM | POA: Diagnosis not present

## 2019-03-15 DIAGNOSIS — N186 End stage renal disease: Secondary | ICD-10-CM | POA: Diagnosis not present

## 2019-03-17 DIAGNOSIS — N2581 Secondary hyperparathyroidism of renal origin: Secondary | ICD-10-CM | POA: Diagnosis not present

## 2019-03-17 DIAGNOSIS — N186 End stage renal disease: Secondary | ICD-10-CM | POA: Diagnosis not present

## 2019-03-17 DIAGNOSIS — E119 Type 2 diabetes mellitus without complications: Secondary | ICD-10-CM | POA: Diagnosis not present

## 2019-03-17 DIAGNOSIS — Z992 Dependence on renal dialysis: Secondary | ICD-10-CM | POA: Diagnosis not present

## 2019-03-17 DIAGNOSIS — D631 Anemia in chronic kidney disease: Secondary | ICD-10-CM | POA: Diagnosis not present

## 2019-03-17 DIAGNOSIS — D509 Iron deficiency anemia, unspecified: Secondary | ICD-10-CM | POA: Diagnosis not present

## 2019-03-19 DIAGNOSIS — N186 End stage renal disease: Secondary | ICD-10-CM | POA: Diagnosis not present

## 2019-03-19 DIAGNOSIS — Z992 Dependence on renal dialysis: Secondary | ICD-10-CM | POA: Diagnosis not present

## 2019-03-19 DIAGNOSIS — E119 Type 2 diabetes mellitus without complications: Secondary | ICD-10-CM | POA: Diagnosis not present

## 2019-03-19 DIAGNOSIS — N2581 Secondary hyperparathyroidism of renal origin: Secondary | ICD-10-CM | POA: Diagnosis not present

## 2019-03-19 DIAGNOSIS — D509 Iron deficiency anemia, unspecified: Secondary | ICD-10-CM | POA: Diagnosis not present

## 2019-03-19 DIAGNOSIS — D631 Anemia in chronic kidney disease: Secondary | ICD-10-CM | POA: Diagnosis not present

## 2019-03-22 DIAGNOSIS — E119 Type 2 diabetes mellitus without complications: Secondary | ICD-10-CM | POA: Diagnosis not present

## 2019-03-22 DIAGNOSIS — D509 Iron deficiency anemia, unspecified: Secondary | ICD-10-CM | POA: Diagnosis not present

## 2019-03-22 DIAGNOSIS — D631 Anemia in chronic kidney disease: Secondary | ICD-10-CM | POA: Diagnosis not present

## 2019-03-22 DIAGNOSIS — N2581 Secondary hyperparathyroidism of renal origin: Secondary | ICD-10-CM | POA: Diagnosis not present

## 2019-03-22 DIAGNOSIS — N186 End stage renal disease: Secondary | ICD-10-CM | POA: Diagnosis not present

## 2019-03-22 DIAGNOSIS — Z992 Dependence on renal dialysis: Secondary | ICD-10-CM | POA: Diagnosis not present

## 2019-03-24 DIAGNOSIS — D631 Anemia in chronic kidney disease: Secondary | ICD-10-CM | POA: Diagnosis not present

## 2019-03-24 DIAGNOSIS — D509 Iron deficiency anemia, unspecified: Secondary | ICD-10-CM | POA: Diagnosis not present

## 2019-03-24 DIAGNOSIS — N2581 Secondary hyperparathyroidism of renal origin: Secondary | ICD-10-CM | POA: Diagnosis not present

## 2019-03-24 DIAGNOSIS — N186 End stage renal disease: Secondary | ICD-10-CM | POA: Diagnosis not present

## 2019-03-24 DIAGNOSIS — E119 Type 2 diabetes mellitus without complications: Secondary | ICD-10-CM | POA: Diagnosis not present

## 2019-03-24 DIAGNOSIS — Z992 Dependence on renal dialysis: Secondary | ICD-10-CM | POA: Diagnosis not present

## 2019-03-25 ENCOUNTER — Other Ambulatory Visit: Payer: Self-pay

## 2019-03-25 DIAGNOSIS — N186 End stage renal disease: Secondary | ICD-10-CM

## 2019-03-25 DIAGNOSIS — Z992 Dependence on renal dialysis: Secondary | ICD-10-CM

## 2019-03-26 DIAGNOSIS — N2581 Secondary hyperparathyroidism of renal origin: Secondary | ICD-10-CM | POA: Diagnosis not present

## 2019-03-26 DIAGNOSIS — N186 End stage renal disease: Secondary | ICD-10-CM | POA: Diagnosis not present

## 2019-03-26 DIAGNOSIS — D509 Iron deficiency anemia, unspecified: Secondary | ICD-10-CM | POA: Diagnosis not present

## 2019-03-26 DIAGNOSIS — Z992 Dependence on renal dialysis: Secondary | ICD-10-CM | POA: Diagnosis not present

## 2019-03-26 DIAGNOSIS — D631 Anemia in chronic kidney disease: Secondary | ICD-10-CM | POA: Diagnosis not present

## 2019-03-26 DIAGNOSIS — E119 Type 2 diabetes mellitus without complications: Secondary | ICD-10-CM | POA: Diagnosis not present

## 2019-03-27 DIAGNOSIS — Z992 Dependence on renal dialysis: Secondary | ICD-10-CM | POA: Diagnosis not present

## 2019-03-27 DIAGNOSIS — N186 End stage renal disease: Secondary | ICD-10-CM | POA: Diagnosis not present

## 2019-03-27 DIAGNOSIS — I120 Hypertensive chronic kidney disease with stage 5 chronic kidney disease or end stage renal disease: Secondary | ICD-10-CM | POA: Diagnosis not present

## 2019-03-29 DIAGNOSIS — Z992 Dependence on renal dialysis: Secondary | ICD-10-CM | POA: Diagnosis not present

## 2019-03-29 DIAGNOSIS — E119 Type 2 diabetes mellitus without complications: Secondary | ICD-10-CM | POA: Diagnosis not present

## 2019-03-29 DIAGNOSIS — N186 End stage renal disease: Secondary | ICD-10-CM | POA: Diagnosis not present

## 2019-03-29 DIAGNOSIS — D631 Anemia in chronic kidney disease: Secondary | ICD-10-CM | POA: Diagnosis not present

## 2019-03-29 DIAGNOSIS — N2581 Secondary hyperparathyroidism of renal origin: Secondary | ICD-10-CM | POA: Diagnosis not present

## 2019-03-30 ENCOUNTER — Encounter: Payer: Medicare Other | Admitting: Family

## 2019-03-30 ENCOUNTER — Ambulatory Visit (HOSPITAL_COMMUNITY): Payer: Medicare Other | Attending: Family

## 2019-04-02 DIAGNOSIS — D631 Anemia in chronic kidney disease: Secondary | ICD-10-CM | POA: Diagnosis not present

## 2019-04-02 DIAGNOSIS — Z992 Dependence on renal dialysis: Secondary | ICD-10-CM | POA: Diagnosis not present

## 2019-04-02 DIAGNOSIS — E119 Type 2 diabetes mellitus without complications: Secondary | ICD-10-CM | POA: Diagnosis not present

## 2019-04-02 DIAGNOSIS — N186 End stage renal disease: Secondary | ICD-10-CM | POA: Diagnosis not present

## 2019-04-02 DIAGNOSIS — N2581 Secondary hyperparathyroidism of renal origin: Secondary | ICD-10-CM | POA: Diagnosis not present

## 2019-04-05 DIAGNOSIS — E119 Type 2 diabetes mellitus without complications: Secondary | ICD-10-CM | POA: Diagnosis not present

## 2019-04-05 DIAGNOSIS — Z992 Dependence on renal dialysis: Secondary | ICD-10-CM | POA: Diagnosis not present

## 2019-04-05 DIAGNOSIS — N186 End stage renal disease: Secondary | ICD-10-CM | POA: Diagnosis not present

## 2019-04-05 DIAGNOSIS — N2581 Secondary hyperparathyroidism of renal origin: Secondary | ICD-10-CM | POA: Diagnosis not present

## 2019-04-05 DIAGNOSIS — D631 Anemia in chronic kidney disease: Secondary | ICD-10-CM | POA: Diagnosis not present

## 2019-04-07 DIAGNOSIS — Z992 Dependence on renal dialysis: Secondary | ICD-10-CM | POA: Diagnosis not present

## 2019-04-07 DIAGNOSIS — E119 Type 2 diabetes mellitus without complications: Secondary | ICD-10-CM | POA: Diagnosis not present

## 2019-04-07 DIAGNOSIS — N186 End stage renal disease: Secondary | ICD-10-CM | POA: Diagnosis not present

## 2019-04-07 DIAGNOSIS — D631 Anemia in chronic kidney disease: Secondary | ICD-10-CM | POA: Diagnosis not present

## 2019-04-07 DIAGNOSIS — N2581 Secondary hyperparathyroidism of renal origin: Secondary | ICD-10-CM | POA: Diagnosis not present

## 2019-04-09 DIAGNOSIS — N186 End stage renal disease: Secondary | ICD-10-CM | POA: Diagnosis not present

## 2019-04-09 DIAGNOSIS — E119 Type 2 diabetes mellitus without complications: Secondary | ICD-10-CM | POA: Diagnosis not present

## 2019-04-09 DIAGNOSIS — Z992 Dependence on renal dialysis: Secondary | ICD-10-CM | POA: Diagnosis not present

## 2019-04-09 DIAGNOSIS — N2581 Secondary hyperparathyroidism of renal origin: Secondary | ICD-10-CM | POA: Diagnosis not present

## 2019-04-09 DIAGNOSIS — D631 Anemia in chronic kidney disease: Secondary | ICD-10-CM | POA: Diagnosis not present

## 2019-04-12 DIAGNOSIS — Z992 Dependence on renal dialysis: Secondary | ICD-10-CM | POA: Diagnosis not present

## 2019-04-12 DIAGNOSIS — E119 Type 2 diabetes mellitus without complications: Secondary | ICD-10-CM | POA: Diagnosis not present

## 2019-04-12 DIAGNOSIS — N2581 Secondary hyperparathyroidism of renal origin: Secondary | ICD-10-CM | POA: Diagnosis not present

## 2019-04-12 DIAGNOSIS — D631 Anemia in chronic kidney disease: Secondary | ICD-10-CM | POA: Diagnosis not present

## 2019-04-12 DIAGNOSIS — N186 End stage renal disease: Secondary | ICD-10-CM | POA: Diagnosis not present

## 2019-04-14 DIAGNOSIS — Z992 Dependence on renal dialysis: Secondary | ICD-10-CM | POA: Diagnosis not present

## 2019-04-14 DIAGNOSIS — E119 Type 2 diabetes mellitus without complications: Secondary | ICD-10-CM | POA: Diagnosis not present

## 2019-04-14 DIAGNOSIS — N186 End stage renal disease: Secondary | ICD-10-CM | POA: Diagnosis not present

## 2019-04-14 DIAGNOSIS — D631 Anemia in chronic kidney disease: Secondary | ICD-10-CM | POA: Diagnosis not present

## 2019-04-14 DIAGNOSIS — N2581 Secondary hyperparathyroidism of renal origin: Secondary | ICD-10-CM | POA: Diagnosis not present

## 2019-04-16 DIAGNOSIS — E119 Type 2 diabetes mellitus without complications: Secondary | ICD-10-CM | POA: Diagnosis not present

## 2019-04-16 DIAGNOSIS — N2581 Secondary hyperparathyroidism of renal origin: Secondary | ICD-10-CM | POA: Diagnosis not present

## 2019-04-16 DIAGNOSIS — D631 Anemia in chronic kidney disease: Secondary | ICD-10-CM | POA: Diagnosis not present

## 2019-04-16 DIAGNOSIS — Z992 Dependence on renal dialysis: Secondary | ICD-10-CM | POA: Diagnosis not present

## 2019-04-16 DIAGNOSIS — N186 End stage renal disease: Secondary | ICD-10-CM | POA: Diagnosis not present

## 2019-04-18 DIAGNOSIS — E119 Type 2 diabetes mellitus without complications: Secondary | ICD-10-CM | POA: Diagnosis not present

## 2019-04-18 DIAGNOSIS — N2581 Secondary hyperparathyroidism of renal origin: Secondary | ICD-10-CM | POA: Diagnosis not present

## 2019-04-18 DIAGNOSIS — Z992 Dependence on renal dialysis: Secondary | ICD-10-CM | POA: Diagnosis not present

## 2019-04-18 DIAGNOSIS — D631 Anemia in chronic kidney disease: Secondary | ICD-10-CM | POA: Diagnosis not present

## 2019-04-18 DIAGNOSIS — N186 End stage renal disease: Secondary | ICD-10-CM | POA: Diagnosis not present

## 2019-04-20 DIAGNOSIS — N2581 Secondary hyperparathyroidism of renal origin: Secondary | ICD-10-CM | POA: Diagnosis not present

## 2019-04-20 DIAGNOSIS — D631 Anemia in chronic kidney disease: Secondary | ICD-10-CM | POA: Diagnosis not present

## 2019-04-20 DIAGNOSIS — N186 End stage renal disease: Secondary | ICD-10-CM | POA: Diagnosis not present

## 2019-04-20 DIAGNOSIS — E119 Type 2 diabetes mellitus without complications: Secondary | ICD-10-CM | POA: Diagnosis not present

## 2019-04-20 DIAGNOSIS — Z992 Dependence on renal dialysis: Secondary | ICD-10-CM | POA: Diagnosis not present

## 2019-04-23 DIAGNOSIS — D631 Anemia in chronic kidney disease: Secondary | ICD-10-CM | POA: Diagnosis not present

## 2019-04-23 DIAGNOSIS — Z992 Dependence on renal dialysis: Secondary | ICD-10-CM | POA: Diagnosis not present

## 2019-04-23 DIAGNOSIS — E119 Type 2 diabetes mellitus without complications: Secondary | ICD-10-CM | POA: Diagnosis not present

## 2019-04-23 DIAGNOSIS — N186 End stage renal disease: Secondary | ICD-10-CM | POA: Diagnosis not present

## 2019-04-23 DIAGNOSIS — N2581 Secondary hyperparathyroidism of renal origin: Secondary | ICD-10-CM | POA: Diagnosis not present

## 2019-05-27 DIAGNOSIS — N186 End stage renal disease: Secondary | ICD-10-CM | POA: Diagnosis not present

## 2019-05-27 DIAGNOSIS — I120 Hypertensive chronic kidney disease with stage 5 chronic kidney disease or end stage renal disease: Secondary | ICD-10-CM | POA: Diagnosis not present

## 2019-05-27 DIAGNOSIS — Z992 Dependence on renal dialysis: Secondary | ICD-10-CM | POA: Diagnosis not present

## 2019-05-31 DIAGNOSIS — N186 End stage renal disease: Secondary | ICD-10-CM | POA: Diagnosis not present

## 2019-05-31 DIAGNOSIS — N2581 Secondary hyperparathyroidism of renal origin: Secondary | ICD-10-CM | POA: Diagnosis not present

## 2019-05-31 DIAGNOSIS — Z992 Dependence on renal dialysis: Secondary | ICD-10-CM | POA: Diagnosis not present

## 2019-05-31 DIAGNOSIS — E119 Type 2 diabetes mellitus without complications: Secondary | ICD-10-CM | POA: Diagnosis not present

## 2019-06-02 DIAGNOSIS — E119 Type 2 diabetes mellitus without complications: Secondary | ICD-10-CM | POA: Diagnosis not present

## 2019-06-02 DIAGNOSIS — N2581 Secondary hyperparathyroidism of renal origin: Secondary | ICD-10-CM | POA: Diagnosis not present

## 2019-06-02 DIAGNOSIS — Z992 Dependence on renal dialysis: Secondary | ICD-10-CM | POA: Diagnosis not present

## 2019-06-02 DIAGNOSIS — N186 End stage renal disease: Secondary | ICD-10-CM | POA: Diagnosis not present

## 2019-06-04 DIAGNOSIS — E119 Type 2 diabetes mellitus without complications: Secondary | ICD-10-CM | POA: Diagnosis not present

## 2019-06-04 DIAGNOSIS — Z992 Dependence on renal dialysis: Secondary | ICD-10-CM | POA: Diagnosis not present

## 2019-06-04 DIAGNOSIS — N2581 Secondary hyperparathyroidism of renal origin: Secondary | ICD-10-CM | POA: Diagnosis not present

## 2019-06-04 DIAGNOSIS — N186 End stage renal disease: Secondary | ICD-10-CM | POA: Diagnosis not present

## 2019-06-07 DIAGNOSIS — E119 Type 2 diabetes mellitus without complications: Secondary | ICD-10-CM | POA: Diagnosis not present

## 2019-06-07 DIAGNOSIS — N2581 Secondary hyperparathyroidism of renal origin: Secondary | ICD-10-CM | POA: Diagnosis not present

## 2019-06-07 DIAGNOSIS — N186 End stage renal disease: Secondary | ICD-10-CM | POA: Diagnosis not present

## 2019-06-07 DIAGNOSIS — Z992 Dependence on renal dialysis: Secondary | ICD-10-CM | POA: Diagnosis not present

## 2019-06-09 DIAGNOSIS — E1129 Type 2 diabetes mellitus with other diabetic kidney complication: Secondary | ICD-10-CM | POA: Diagnosis not present

## 2019-06-09 DIAGNOSIS — E119 Type 2 diabetes mellitus without complications: Secondary | ICD-10-CM | POA: Diagnosis not present

## 2019-06-09 DIAGNOSIS — Z992 Dependence on renal dialysis: Secondary | ICD-10-CM | POA: Diagnosis not present

## 2019-06-09 DIAGNOSIS — N186 End stage renal disease: Secondary | ICD-10-CM | POA: Diagnosis not present

## 2019-06-09 DIAGNOSIS — N2581 Secondary hyperparathyroidism of renal origin: Secondary | ICD-10-CM | POA: Diagnosis not present

## 2019-06-11 DIAGNOSIS — N2581 Secondary hyperparathyroidism of renal origin: Secondary | ICD-10-CM | POA: Diagnosis not present

## 2019-06-11 DIAGNOSIS — E119 Type 2 diabetes mellitus without complications: Secondary | ICD-10-CM | POA: Diagnosis not present

## 2019-06-11 DIAGNOSIS — Z992 Dependence on renal dialysis: Secondary | ICD-10-CM | POA: Diagnosis not present

## 2019-06-11 DIAGNOSIS — N186 End stage renal disease: Secondary | ICD-10-CM | POA: Diagnosis not present

## 2019-06-14 DIAGNOSIS — Z992 Dependence on renal dialysis: Secondary | ICD-10-CM | POA: Diagnosis not present

## 2019-06-14 DIAGNOSIS — E119 Type 2 diabetes mellitus without complications: Secondary | ICD-10-CM | POA: Diagnosis not present

## 2019-06-14 DIAGNOSIS — N2581 Secondary hyperparathyroidism of renal origin: Secondary | ICD-10-CM | POA: Diagnosis not present

## 2019-06-14 DIAGNOSIS — N186 End stage renal disease: Secondary | ICD-10-CM | POA: Diagnosis not present

## 2019-06-16 DIAGNOSIS — N2581 Secondary hyperparathyroidism of renal origin: Secondary | ICD-10-CM | POA: Diagnosis not present

## 2019-06-16 DIAGNOSIS — Z992 Dependence on renal dialysis: Secondary | ICD-10-CM | POA: Diagnosis not present

## 2019-06-16 DIAGNOSIS — N186 End stage renal disease: Secondary | ICD-10-CM | POA: Diagnosis not present

## 2019-06-16 DIAGNOSIS — E119 Type 2 diabetes mellitus without complications: Secondary | ICD-10-CM | POA: Diagnosis not present

## 2019-06-18 DIAGNOSIS — N186 End stage renal disease: Secondary | ICD-10-CM | POA: Diagnosis not present

## 2019-06-18 DIAGNOSIS — N2581 Secondary hyperparathyroidism of renal origin: Secondary | ICD-10-CM | POA: Diagnosis not present

## 2019-06-18 DIAGNOSIS — Z992 Dependence on renal dialysis: Secondary | ICD-10-CM | POA: Diagnosis not present

## 2019-06-18 DIAGNOSIS — E119 Type 2 diabetes mellitus without complications: Secondary | ICD-10-CM | POA: Diagnosis not present

## 2019-06-21 DIAGNOSIS — E119 Type 2 diabetes mellitus without complications: Secondary | ICD-10-CM | POA: Diagnosis not present

## 2019-06-21 DIAGNOSIS — Z992 Dependence on renal dialysis: Secondary | ICD-10-CM | POA: Diagnosis not present

## 2019-06-21 DIAGNOSIS — N186 End stage renal disease: Secondary | ICD-10-CM | POA: Diagnosis not present

## 2019-06-21 DIAGNOSIS — N2581 Secondary hyperparathyroidism of renal origin: Secondary | ICD-10-CM | POA: Diagnosis not present

## 2019-06-23 DIAGNOSIS — E119 Type 2 diabetes mellitus without complications: Secondary | ICD-10-CM | POA: Diagnosis not present

## 2019-06-23 DIAGNOSIS — N186 End stage renal disease: Secondary | ICD-10-CM | POA: Diagnosis not present

## 2019-06-23 DIAGNOSIS — N2581 Secondary hyperparathyroidism of renal origin: Secondary | ICD-10-CM | POA: Diagnosis not present

## 2019-06-23 DIAGNOSIS — Z992 Dependence on renal dialysis: Secondary | ICD-10-CM | POA: Diagnosis not present

## 2019-06-25 DIAGNOSIS — E119 Type 2 diabetes mellitus without complications: Secondary | ICD-10-CM | POA: Diagnosis not present

## 2019-06-25 DIAGNOSIS — N186 End stage renal disease: Secondary | ICD-10-CM | POA: Diagnosis not present

## 2019-06-25 DIAGNOSIS — Z992 Dependence on renal dialysis: Secondary | ICD-10-CM | POA: Diagnosis not present

## 2019-06-25 DIAGNOSIS — N2581 Secondary hyperparathyroidism of renal origin: Secondary | ICD-10-CM | POA: Diagnosis not present

## 2019-06-27 DIAGNOSIS — Z992 Dependence on renal dialysis: Secondary | ICD-10-CM | POA: Diagnosis not present

## 2019-06-27 DIAGNOSIS — N186 End stage renal disease: Secondary | ICD-10-CM | POA: Diagnosis not present

## 2019-06-28 DIAGNOSIS — D631 Anemia in chronic kidney disease: Secondary | ICD-10-CM | POA: Diagnosis not present

## 2019-06-28 DIAGNOSIS — Z992 Dependence on renal dialysis: Secondary | ICD-10-CM | POA: Diagnosis not present

## 2019-06-28 DIAGNOSIS — N2581 Secondary hyperparathyroidism of renal origin: Secondary | ICD-10-CM | POA: Diagnosis not present

## 2019-06-28 DIAGNOSIS — N186 End stage renal disease: Secondary | ICD-10-CM | POA: Diagnosis not present

## 2019-06-28 DIAGNOSIS — D509 Iron deficiency anemia, unspecified: Secondary | ICD-10-CM | POA: Diagnosis not present

## 2019-06-30 DIAGNOSIS — N186 End stage renal disease: Secondary | ICD-10-CM | POA: Diagnosis not present

## 2019-06-30 DIAGNOSIS — D509 Iron deficiency anemia, unspecified: Secondary | ICD-10-CM | POA: Diagnosis not present

## 2019-06-30 DIAGNOSIS — N2581 Secondary hyperparathyroidism of renal origin: Secondary | ICD-10-CM | POA: Diagnosis not present

## 2019-06-30 DIAGNOSIS — D631 Anemia in chronic kidney disease: Secondary | ICD-10-CM | POA: Diagnosis not present

## 2019-06-30 DIAGNOSIS — Z992 Dependence on renal dialysis: Secondary | ICD-10-CM | POA: Diagnosis not present

## 2019-07-02 DIAGNOSIS — N186 End stage renal disease: Secondary | ICD-10-CM | POA: Diagnosis not present

## 2019-07-02 DIAGNOSIS — D509 Iron deficiency anemia, unspecified: Secondary | ICD-10-CM | POA: Diagnosis not present

## 2019-07-02 DIAGNOSIS — Z992 Dependence on renal dialysis: Secondary | ICD-10-CM | POA: Diagnosis not present

## 2019-07-02 DIAGNOSIS — N2581 Secondary hyperparathyroidism of renal origin: Secondary | ICD-10-CM | POA: Diagnosis not present

## 2019-07-02 DIAGNOSIS — D631 Anemia in chronic kidney disease: Secondary | ICD-10-CM | POA: Diagnosis not present

## 2019-07-04 DIAGNOSIS — Z992 Dependence on renal dialysis: Secondary | ICD-10-CM | POA: Diagnosis not present

## 2019-07-04 DIAGNOSIS — N2581 Secondary hyperparathyroidism of renal origin: Secondary | ICD-10-CM | POA: Diagnosis not present

## 2019-07-04 DIAGNOSIS — D509 Iron deficiency anemia, unspecified: Secondary | ICD-10-CM | POA: Diagnosis not present

## 2019-07-04 DIAGNOSIS — D631 Anemia in chronic kidney disease: Secondary | ICD-10-CM | POA: Diagnosis not present

## 2019-07-04 DIAGNOSIS — N186 End stage renal disease: Secondary | ICD-10-CM | POA: Diagnosis not present

## 2019-07-06 DIAGNOSIS — D631 Anemia in chronic kidney disease: Secondary | ICD-10-CM | POA: Diagnosis not present

## 2019-07-06 DIAGNOSIS — D509 Iron deficiency anemia, unspecified: Secondary | ICD-10-CM | POA: Diagnosis not present

## 2019-07-06 DIAGNOSIS — N2581 Secondary hyperparathyroidism of renal origin: Secondary | ICD-10-CM | POA: Diagnosis not present

## 2019-07-06 DIAGNOSIS — Z992 Dependence on renal dialysis: Secondary | ICD-10-CM | POA: Diagnosis not present

## 2019-07-06 DIAGNOSIS — N186 End stage renal disease: Secondary | ICD-10-CM | POA: Diagnosis not present

## 2019-07-08 DIAGNOSIS — D509 Iron deficiency anemia, unspecified: Secondary | ICD-10-CM | POA: Diagnosis not present

## 2019-07-08 DIAGNOSIS — D631 Anemia in chronic kidney disease: Secondary | ICD-10-CM | POA: Diagnosis not present

## 2019-07-08 DIAGNOSIS — N2581 Secondary hyperparathyroidism of renal origin: Secondary | ICD-10-CM | POA: Diagnosis not present

## 2019-07-08 DIAGNOSIS — N186 End stage renal disease: Secondary | ICD-10-CM | POA: Diagnosis not present

## 2019-07-08 DIAGNOSIS — Z992 Dependence on renal dialysis: Secondary | ICD-10-CM | POA: Diagnosis not present

## 2019-07-11 DIAGNOSIS — N186 End stage renal disease: Secondary | ICD-10-CM | POA: Diagnosis not present

## 2019-07-11 DIAGNOSIS — N2581 Secondary hyperparathyroidism of renal origin: Secondary | ICD-10-CM | POA: Diagnosis not present

## 2019-07-11 DIAGNOSIS — D509 Iron deficiency anemia, unspecified: Secondary | ICD-10-CM | POA: Diagnosis not present

## 2019-07-11 DIAGNOSIS — D631 Anemia in chronic kidney disease: Secondary | ICD-10-CM | POA: Diagnosis not present

## 2019-07-11 DIAGNOSIS — Z992 Dependence on renal dialysis: Secondary | ICD-10-CM | POA: Diagnosis not present

## 2019-07-13 DIAGNOSIS — N186 End stage renal disease: Secondary | ICD-10-CM | POA: Diagnosis not present

## 2019-07-13 DIAGNOSIS — Z992 Dependence on renal dialysis: Secondary | ICD-10-CM | POA: Diagnosis not present

## 2019-07-13 DIAGNOSIS — D509 Iron deficiency anemia, unspecified: Secondary | ICD-10-CM | POA: Diagnosis not present

## 2019-07-13 DIAGNOSIS — N2581 Secondary hyperparathyroidism of renal origin: Secondary | ICD-10-CM | POA: Diagnosis not present

## 2019-07-13 DIAGNOSIS — D631 Anemia in chronic kidney disease: Secondary | ICD-10-CM | POA: Diagnosis not present

## 2019-07-15 DIAGNOSIS — Z992 Dependence on renal dialysis: Secondary | ICD-10-CM | POA: Diagnosis not present

## 2019-07-15 DIAGNOSIS — N186 End stage renal disease: Secondary | ICD-10-CM | POA: Diagnosis not present

## 2019-07-15 DIAGNOSIS — N2581 Secondary hyperparathyroidism of renal origin: Secondary | ICD-10-CM | POA: Diagnosis not present

## 2019-07-15 DIAGNOSIS — D631 Anemia in chronic kidney disease: Secondary | ICD-10-CM | POA: Diagnosis not present

## 2019-07-15 DIAGNOSIS — D509 Iron deficiency anemia, unspecified: Secondary | ICD-10-CM | POA: Diagnosis not present

## 2019-07-18 DIAGNOSIS — N2581 Secondary hyperparathyroidism of renal origin: Secondary | ICD-10-CM | POA: Diagnosis not present

## 2019-07-18 DIAGNOSIS — Z992 Dependence on renal dialysis: Secondary | ICD-10-CM | POA: Diagnosis not present

## 2019-07-18 DIAGNOSIS — D509 Iron deficiency anemia, unspecified: Secondary | ICD-10-CM | POA: Diagnosis not present

## 2019-07-18 DIAGNOSIS — D631 Anemia in chronic kidney disease: Secondary | ICD-10-CM | POA: Diagnosis not present

## 2019-07-18 DIAGNOSIS — N186 End stage renal disease: Secondary | ICD-10-CM | POA: Diagnosis not present

## 2019-07-20 DIAGNOSIS — Z992 Dependence on renal dialysis: Secondary | ICD-10-CM | POA: Diagnosis not present

## 2019-07-20 DIAGNOSIS — D509 Iron deficiency anemia, unspecified: Secondary | ICD-10-CM | POA: Diagnosis not present

## 2019-07-20 DIAGNOSIS — N2581 Secondary hyperparathyroidism of renal origin: Secondary | ICD-10-CM | POA: Diagnosis not present

## 2019-07-20 DIAGNOSIS — N186 End stage renal disease: Secondary | ICD-10-CM | POA: Diagnosis not present

## 2019-07-20 DIAGNOSIS — D631 Anemia in chronic kidney disease: Secondary | ICD-10-CM | POA: Diagnosis not present

## 2019-07-22 DIAGNOSIS — D631 Anemia in chronic kidney disease: Secondary | ICD-10-CM | POA: Diagnosis not present

## 2019-07-22 DIAGNOSIS — N2581 Secondary hyperparathyroidism of renal origin: Secondary | ICD-10-CM | POA: Diagnosis not present

## 2019-07-22 DIAGNOSIS — N186 End stage renal disease: Secondary | ICD-10-CM | POA: Diagnosis not present

## 2019-07-22 DIAGNOSIS — D509 Iron deficiency anemia, unspecified: Secondary | ICD-10-CM | POA: Diagnosis not present

## 2019-07-22 DIAGNOSIS — Z992 Dependence on renal dialysis: Secondary | ICD-10-CM | POA: Diagnosis not present

## 2019-07-25 DIAGNOSIS — D509 Iron deficiency anemia, unspecified: Secondary | ICD-10-CM | POA: Diagnosis not present

## 2019-07-25 DIAGNOSIS — Z992 Dependence on renal dialysis: Secondary | ICD-10-CM | POA: Diagnosis not present

## 2019-07-25 DIAGNOSIS — N186 End stage renal disease: Secondary | ICD-10-CM | POA: Diagnosis not present

## 2019-07-25 DIAGNOSIS — N2581 Secondary hyperparathyroidism of renal origin: Secondary | ICD-10-CM | POA: Diagnosis not present

## 2019-07-25 DIAGNOSIS — D631 Anemia in chronic kidney disease: Secondary | ICD-10-CM | POA: Diagnosis not present

## 2019-07-27 DIAGNOSIS — D509 Iron deficiency anemia, unspecified: Secondary | ICD-10-CM | POA: Diagnosis not present

## 2019-07-27 DIAGNOSIS — N186 End stage renal disease: Secondary | ICD-10-CM | POA: Diagnosis not present

## 2019-07-27 DIAGNOSIS — Z992 Dependence on renal dialysis: Secondary | ICD-10-CM | POA: Diagnosis not present

## 2019-07-27 DIAGNOSIS — N2581 Secondary hyperparathyroidism of renal origin: Secondary | ICD-10-CM | POA: Diagnosis not present

## 2019-07-27 DIAGNOSIS — D631 Anemia in chronic kidney disease: Secondary | ICD-10-CM | POA: Diagnosis not present

## 2019-07-29 DIAGNOSIS — D631 Anemia in chronic kidney disease: Secondary | ICD-10-CM | POA: Diagnosis not present

## 2019-07-29 DIAGNOSIS — Z992 Dependence on renal dialysis: Secondary | ICD-10-CM | POA: Diagnosis not present

## 2019-07-29 DIAGNOSIS — N186 End stage renal disease: Secondary | ICD-10-CM | POA: Diagnosis not present

## 2019-07-29 DIAGNOSIS — N2581 Secondary hyperparathyroidism of renal origin: Secondary | ICD-10-CM | POA: Diagnosis not present

## 2019-07-29 DIAGNOSIS — D509 Iron deficiency anemia, unspecified: Secondary | ICD-10-CM | POA: Diagnosis not present

## 2019-08-01 DIAGNOSIS — D631 Anemia in chronic kidney disease: Secondary | ICD-10-CM | POA: Diagnosis not present

## 2019-08-01 DIAGNOSIS — N2581 Secondary hyperparathyroidism of renal origin: Secondary | ICD-10-CM | POA: Diagnosis not present

## 2019-08-01 DIAGNOSIS — N186 End stage renal disease: Secondary | ICD-10-CM | POA: Diagnosis not present

## 2019-08-01 DIAGNOSIS — Z992 Dependence on renal dialysis: Secondary | ICD-10-CM | POA: Diagnosis not present

## 2019-08-01 DIAGNOSIS — D509 Iron deficiency anemia, unspecified: Secondary | ICD-10-CM | POA: Diagnosis not present

## 2019-08-03 DIAGNOSIS — N186 End stage renal disease: Secondary | ICD-10-CM | POA: Diagnosis not present

## 2019-08-03 DIAGNOSIS — N2581 Secondary hyperparathyroidism of renal origin: Secondary | ICD-10-CM | POA: Diagnosis not present

## 2019-08-03 DIAGNOSIS — D509 Iron deficiency anemia, unspecified: Secondary | ICD-10-CM | POA: Diagnosis not present

## 2019-08-03 DIAGNOSIS — Z992 Dependence on renal dialysis: Secondary | ICD-10-CM | POA: Diagnosis not present

## 2019-08-03 DIAGNOSIS — D631 Anemia in chronic kidney disease: Secondary | ICD-10-CM | POA: Diagnosis not present

## 2019-08-05 DIAGNOSIS — D509 Iron deficiency anemia, unspecified: Secondary | ICD-10-CM | POA: Diagnosis not present

## 2019-08-05 DIAGNOSIS — D631 Anemia in chronic kidney disease: Secondary | ICD-10-CM | POA: Diagnosis not present

## 2019-08-05 DIAGNOSIS — Z992 Dependence on renal dialysis: Secondary | ICD-10-CM | POA: Diagnosis not present

## 2019-08-05 DIAGNOSIS — N186 End stage renal disease: Secondary | ICD-10-CM | POA: Diagnosis not present

## 2019-08-05 DIAGNOSIS — N2581 Secondary hyperparathyroidism of renal origin: Secondary | ICD-10-CM | POA: Diagnosis not present

## 2019-08-10 DIAGNOSIS — D631 Anemia in chronic kidney disease: Secondary | ICD-10-CM | POA: Diagnosis not present

## 2019-08-10 DIAGNOSIS — D509 Iron deficiency anemia, unspecified: Secondary | ICD-10-CM | POA: Diagnosis not present

## 2019-08-10 DIAGNOSIS — N186 End stage renal disease: Secondary | ICD-10-CM | POA: Diagnosis not present

## 2019-08-10 DIAGNOSIS — Z992 Dependence on renal dialysis: Secondary | ICD-10-CM | POA: Diagnosis not present

## 2019-08-10 DIAGNOSIS — N2581 Secondary hyperparathyroidism of renal origin: Secondary | ICD-10-CM | POA: Diagnosis not present

## 2019-08-12 DIAGNOSIS — Z992 Dependence on renal dialysis: Secondary | ICD-10-CM | POA: Diagnosis not present

## 2019-08-12 DIAGNOSIS — N2581 Secondary hyperparathyroidism of renal origin: Secondary | ICD-10-CM | POA: Diagnosis not present

## 2019-08-12 DIAGNOSIS — D509 Iron deficiency anemia, unspecified: Secondary | ICD-10-CM | POA: Diagnosis not present

## 2019-08-12 DIAGNOSIS — D631 Anemia in chronic kidney disease: Secondary | ICD-10-CM | POA: Diagnosis not present

## 2019-08-12 DIAGNOSIS — N186 End stage renal disease: Secondary | ICD-10-CM | POA: Diagnosis not present

## 2019-08-15 DIAGNOSIS — N186 End stage renal disease: Secondary | ICD-10-CM | POA: Diagnosis not present

## 2019-08-15 DIAGNOSIS — N2581 Secondary hyperparathyroidism of renal origin: Secondary | ICD-10-CM | POA: Diagnosis not present

## 2019-08-15 DIAGNOSIS — Z992 Dependence on renal dialysis: Secondary | ICD-10-CM | POA: Diagnosis not present

## 2019-08-15 DIAGNOSIS — D631 Anemia in chronic kidney disease: Secondary | ICD-10-CM | POA: Diagnosis not present

## 2019-08-15 DIAGNOSIS — D509 Iron deficiency anemia, unspecified: Secondary | ICD-10-CM | POA: Diagnosis not present

## 2019-08-17 DIAGNOSIS — N186 End stage renal disease: Secondary | ICD-10-CM | POA: Diagnosis not present

## 2019-08-17 DIAGNOSIS — N2581 Secondary hyperparathyroidism of renal origin: Secondary | ICD-10-CM | POA: Diagnosis not present

## 2019-08-17 DIAGNOSIS — D509 Iron deficiency anemia, unspecified: Secondary | ICD-10-CM | POA: Diagnosis not present

## 2019-08-17 DIAGNOSIS — D631 Anemia in chronic kidney disease: Secondary | ICD-10-CM | POA: Diagnosis not present

## 2019-08-17 DIAGNOSIS — Z992 Dependence on renal dialysis: Secondary | ICD-10-CM | POA: Diagnosis not present

## 2019-08-19 DIAGNOSIS — N186 End stage renal disease: Secondary | ICD-10-CM | POA: Diagnosis not present

## 2019-08-19 DIAGNOSIS — D631 Anemia in chronic kidney disease: Secondary | ICD-10-CM | POA: Diagnosis not present

## 2019-08-19 DIAGNOSIS — N2581 Secondary hyperparathyroidism of renal origin: Secondary | ICD-10-CM | POA: Diagnosis not present

## 2019-08-19 DIAGNOSIS — D509 Iron deficiency anemia, unspecified: Secondary | ICD-10-CM | POA: Diagnosis not present

## 2019-08-19 DIAGNOSIS — Z992 Dependence on renal dialysis: Secondary | ICD-10-CM | POA: Diagnosis not present

## 2019-08-22 DIAGNOSIS — D631 Anemia in chronic kidney disease: Secondary | ICD-10-CM | POA: Diagnosis not present

## 2019-08-22 DIAGNOSIS — N2581 Secondary hyperparathyroidism of renal origin: Secondary | ICD-10-CM | POA: Diagnosis not present

## 2019-08-22 DIAGNOSIS — Z992 Dependence on renal dialysis: Secondary | ICD-10-CM | POA: Diagnosis not present

## 2019-08-22 DIAGNOSIS — N186 End stage renal disease: Secondary | ICD-10-CM | POA: Diagnosis not present

## 2019-08-22 DIAGNOSIS — D509 Iron deficiency anemia, unspecified: Secondary | ICD-10-CM | POA: Diagnosis not present

## 2019-08-24 DIAGNOSIS — Z992 Dependence on renal dialysis: Secondary | ICD-10-CM | POA: Diagnosis not present

## 2019-08-24 DIAGNOSIS — D631 Anemia in chronic kidney disease: Secondary | ICD-10-CM | POA: Diagnosis not present

## 2019-08-24 DIAGNOSIS — N186 End stage renal disease: Secondary | ICD-10-CM | POA: Diagnosis not present

## 2019-08-24 DIAGNOSIS — N2581 Secondary hyperparathyroidism of renal origin: Secondary | ICD-10-CM | POA: Diagnosis not present

## 2019-08-24 DIAGNOSIS — D509 Iron deficiency anemia, unspecified: Secondary | ICD-10-CM | POA: Diagnosis not present

## 2019-08-25 DIAGNOSIS — N186 End stage renal disease: Secondary | ICD-10-CM | POA: Diagnosis not present

## 2019-08-25 DIAGNOSIS — Z992 Dependence on renal dialysis: Secondary | ICD-10-CM | POA: Diagnosis not present

## 2019-08-26 DIAGNOSIS — N186 End stage renal disease: Secondary | ICD-10-CM | POA: Diagnosis not present

## 2019-08-26 DIAGNOSIS — D509 Iron deficiency anemia, unspecified: Secondary | ICD-10-CM | POA: Diagnosis not present

## 2019-08-26 DIAGNOSIS — D631 Anemia in chronic kidney disease: Secondary | ICD-10-CM | POA: Diagnosis not present

## 2019-08-26 DIAGNOSIS — Z992 Dependence on renal dialysis: Secondary | ICD-10-CM | POA: Diagnosis not present

## 2019-08-26 DIAGNOSIS — N2581 Secondary hyperparathyroidism of renal origin: Secondary | ICD-10-CM | POA: Diagnosis not present

## 2019-08-29 DIAGNOSIS — N2581 Secondary hyperparathyroidism of renal origin: Secondary | ICD-10-CM | POA: Diagnosis not present

## 2019-08-29 DIAGNOSIS — D509 Iron deficiency anemia, unspecified: Secondary | ICD-10-CM | POA: Diagnosis not present

## 2019-08-29 DIAGNOSIS — D631 Anemia in chronic kidney disease: Secondary | ICD-10-CM | POA: Diagnosis not present

## 2019-08-29 DIAGNOSIS — Z992 Dependence on renal dialysis: Secondary | ICD-10-CM | POA: Diagnosis not present

## 2019-08-29 DIAGNOSIS — N186 End stage renal disease: Secondary | ICD-10-CM | POA: Diagnosis not present

## 2019-08-31 DIAGNOSIS — N2581 Secondary hyperparathyroidism of renal origin: Secondary | ICD-10-CM | POA: Diagnosis not present

## 2019-08-31 DIAGNOSIS — N186 End stage renal disease: Secondary | ICD-10-CM | POA: Diagnosis not present

## 2019-08-31 DIAGNOSIS — D631 Anemia in chronic kidney disease: Secondary | ICD-10-CM | POA: Diagnosis not present

## 2019-08-31 DIAGNOSIS — Z992 Dependence on renal dialysis: Secondary | ICD-10-CM | POA: Diagnosis not present

## 2019-08-31 DIAGNOSIS — D509 Iron deficiency anemia, unspecified: Secondary | ICD-10-CM | POA: Diagnosis not present

## 2019-09-02 DIAGNOSIS — N186 End stage renal disease: Secondary | ICD-10-CM | POA: Diagnosis not present

## 2019-09-02 DIAGNOSIS — Z992 Dependence on renal dialysis: Secondary | ICD-10-CM | POA: Diagnosis not present

## 2019-09-02 DIAGNOSIS — D631 Anemia in chronic kidney disease: Secondary | ICD-10-CM | POA: Diagnosis not present

## 2019-09-02 DIAGNOSIS — N2581 Secondary hyperparathyroidism of renal origin: Secondary | ICD-10-CM | POA: Diagnosis not present

## 2019-09-02 DIAGNOSIS — D509 Iron deficiency anemia, unspecified: Secondary | ICD-10-CM | POA: Diagnosis not present

## 2019-09-05 DIAGNOSIS — Z992 Dependence on renal dialysis: Secondary | ICD-10-CM | POA: Diagnosis not present

## 2019-09-05 DIAGNOSIS — N2581 Secondary hyperparathyroidism of renal origin: Secondary | ICD-10-CM | POA: Diagnosis not present

## 2019-09-05 DIAGNOSIS — D509 Iron deficiency anemia, unspecified: Secondary | ICD-10-CM | POA: Diagnosis not present

## 2019-09-05 DIAGNOSIS — D631 Anemia in chronic kidney disease: Secondary | ICD-10-CM | POA: Diagnosis not present

## 2019-09-05 DIAGNOSIS — N186 End stage renal disease: Secondary | ICD-10-CM | POA: Diagnosis not present

## 2019-09-07 DIAGNOSIS — N2581 Secondary hyperparathyroidism of renal origin: Secondary | ICD-10-CM | POA: Diagnosis not present

## 2019-09-07 DIAGNOSIS — D631 Anemia in chronic kidney disease: Secondary | ICD-10-CM | POA: Diagnosis not present

## 2019-09-07 DIAGNOSIS — N186 End stage renal disease: Secondary | ICD-10-CM | POA: Diagnosis not present

## 2019-09-07 DIAGNOSIS — E11621 Type 2 diabetes mellitus with foot ulcer: Secondary | ICD-10-CM | POA: Diagnosis not present

## 2019-09-07 DIAGNOSIS — E1142 Type 2 diabetes mellitus with diabetic polyneuropathy: Secondary | ICD-10-CM | POA: Diagnosis not present

## 2019-09-07 DIAGNOSIS — Z992 Dependence on renal dialysis: Secondary | ICD-10-CM | POA: Diagnosis not present

## 2019-09-07 DIAGNOSIS — L97321 Non-pressure chronic ulcer of left ankle limited to breakdown of skin: Secondary | ICD-10-CM | POA: Diagnosis not present

## 2019-09-07 DIAGNOSIS — D509 Iron deficiency anemia, unspecified: Secondary | ICD-10-CM | POA: Diagnosis not present

## 2019-09-09 DIAGNOSIS — Z992 Dependence on renal dialysis: Secondary | ICD-10-CM | POA: Diagnosis not present

## 2019-09-09 DIAGNOSIS — N186 End stage renal disease: Secondary | ICD-10-CM | POA: Diagnosis not present

## 2019-09-09 DIAGNOSIS — D509 Iron deficiency anemia, unspecified: Secondary | ICD-10-CM | POA: Diagnosis not present

## 2019-09-09 DIAGNOSIS — D631 Anemia in chronic kidney disease: Secondary | ICD-10-CM | POA: Diagnosis not present

## 2019-09-09 DIAGNOSIS — N2581 Secondary hyperparathyroidism of renal origin: Secondary | ICD-10-CM | POA: Diagnosis not present

## 2019-09-12 DIAGNOSIS — D509 Iron deficiency anemia, unspecified: Secondary | ICD-10-CM | POA: Diagnosis not present

## 2019-09-12 DIAGNOSIS — N186 End stage renal disease: Secondary | ICD-10-CM | POA: Diagnosis not present

## 2019-09-12 DIAGNOSIS — N2581 Secondary hyperparathyroidism of renal origin: Secondary | ICD-10-CM | POA: Diagnosis not present

## 2019-09-12 DIAGNOSIS — D631 Anemia in chronic kidney disease: Secondary | ICD-10-CM | POA: Diagnosis not present

## 2019-09-12 DIAGNOSIS — Z992 Dependence on renal dialysis: Secondary | ICD-10-CM | POA: Diagnosis not present

## 2019-09-14 DIAGNOSIS — D509 Iron deficiency anemia, unspecified: Secondary | ICD-10-CM | POA: Diagnosis not present

## 2019-09-14 DIAGNOSIS — N186 End stage renal disease: Secondary | ICD-10-CM | POA: Diagnosis not present

## 2019-09-14 DIAGNOSIS — N2581 Secondary hyperparathyroidism of renal origin: Secondary | ICD-10-CM | POA: Diagnosis not present

## 2019-09-14 DIAGNOSIS — D631 Anemia in chronic kidney disease: Secondary | ICD-10-CM | POA: Diagnosis not present

## 2019-09-14 DIAGNOSIS — Z992 Dependence on renal dialysis: Secondary | ICD-10-CM | POA: Diagnosis not present

## 2019-09-16 DIAGNOSIS — N2581 Secondary hyperparathyroidism of renal origin: Secondary | ICD-10-CM | POA: Diagnosis not present

## 2019-09-16 DIAGNOSIS — D509 Iron deficiency anemia, unspecified: Secondary | ICD-10-CM | POA: Diagnosis not present

## 2019-09-16 DIAGNOSIS — Z992 Dependence on renal dialysis: Secondary | ICD-10-CM | POA: Diagnosis not present

## 2019-09-16 DIAGNOSIS — N186 End stage renal disease: Secondary | ICD-10-CM | POA: Diagnosis not present

## 2019-09-16 DIAGNOSIS — D631 Anemia in chronic kidney disease: Secondary | ICD-10-CM | POA: Diagnosis not present

## 2019-09-19 DIAGNOSIS — N186 End stage renal disease: Secondary | ICD-10-CM | POA: Diagnosis not present

## 2019-09-19 DIAGNOSIS — D631 Anemia in chronic kidney disease: Secondary | ICD-10-CM | POA: Diagnosis not present

## 2019-09-19 DIAGNOSIS — N2581 Secondary hyperparathyroidism of renal origin: Secondary | ICD-10-CM | POA: Diagnosis not present

## 2019-09-19 DIAGNOSIS — Z992 Dependence on renal dialysis: Secondary | ICD-10-CM | POA: Diagnosis not present

## 2019-09-19 DIAGNOSIS — D509 Iron deficiency anemia, unspecified: Secondary | ICD-10-CM | POA: Diagnosis not present

## 2019-09-21 DIAGNOSIS — Z992 Dependence on renal dialysis: Secondary | ICD-10-CM | POA: Diagnosis not present

## 2019-09-21 DIAGNOSIS — D509 Iron deficiency anemia, unspecified: Secondary | ICD-10-CM | POA: Diagnosis not present

## 2019-09-21 DIAGNOSIS — N186 End stage renal disease: Secondary | ICD-10-CM | POA: Diagnosis not present

## 2019-09-21 DIAGNOSIS — E11621 Type 2 diabetes mellitus with foot ulcer: Secondary | ICD-10-CM | POA: Diagnosis not present

## 2019-09-21 DIAGNOSIS — L97321 Non-pressure chronic ulcer of left ankle limited to breakdown of skin: Secondary | ICD-10-CM | POA: Diagnosis not present

## 2019-09-21 DIAGNOSIS — E1142 Type 2 diabetes mellitus with diabetic polyneuropathy: Secondary | ICD-10-CM | POA: Diagnosis not present

## 2019-09-21 DIAGNOSIS — D631 Anemia in chronic kidney disease: Secondary | ICD-10-CM | POA: Diagnosis not present

## 2019-09-21 DIAGNOSIS — N2581 Secondary hyperparathyroidism of renal origin: Secondary | ICD-10-CM | POA: Diagnosis not present

## 2019-09-23 DIAGNOSIS — D509 Iron deficiency anemia, unspecified: Secondary | ICD-10-CM | POA: Diagnosis not present

## 2019-09-23 DIAGNOSIS — N186 End stage renal disease: Secondary | ICD-10-CM | POA: Diagnosis not present

## 2019-09-23 DIAGNOSIS — Z992 Dependence on renal dialysis: Secondary | ICD-10-CM | POA: Diagnosis not present

## 2019-09-23 DIAGNOSIS — D631 Anemia in chronic kidney disease: Secondary | ICD-10-CM | POA: Diagnosis not present

## 2019-09-23 DIAGNOSIS — N2581 Secondary hyperparathyroidism of renal origin: Secondary | ICD-10-CM | POA: Diagnosis not present

## 2019-09-24 DIAGNOSIS — N186 End stage renal disease: Secondary | ICD-10-CM | POA: Diagnosis not present

## 2019-09-24 DIAGNOSIS — Z992 Dependence on renal dialysis: Secondary | ICD-10-CM | POA: Diagnosis not present

## 2019-09-26 DIAGNOSIS — D509 Iron deficiency anemia, unspecified: Secondary | ICD-10-CM | POA: Diagnosis not present

## 2019-09-26 DIAGNOSIS — D631 Anemia in chronic kidney disease: Secondary | ICD-10-CM | POA: Diagnosis not present

## 2019-09-26 DIAGNOSIS — N2581 Secondary hyperparathyroidism of renal origin: Secondary | ICD-10-CM | POA: Diagnosis not present

## 2019-09-26 DIAGNOSIS — Z992 Dependence on renal dialysis: Secondary | ICD-10-CM | POA: Diagnosis not present

## 2019-09-26 DIAGNOSIS — N186 End stage renal disease: Secondary | ICD-10-CM | POA: Diagnosis not present

## 2019-09-26 DIAGNOSIS — T7840XA Allergy, unspecified, initial encounter: Secondary | ICD-10-CM | POA: Diagnosis not present

## 2019-09-26 DIAGNOSIS — E8770 Fluid overload, unspecified: Secondary | ICD-10-CM | POA: Diagnosis not present

## 2019-09-27 DIAGNOSIS — R0681 Apnea, not elsewhere classified: Secondary | ICD-10-CM | POA: Diagnosis not present

## 2019-09-27 DIAGNOSIS — R0683 Snoring: Secondary | ICD-10-CM | POA: Diagnosis not present

## 2019-09-27 DIAGNOSIS — R400 Somnolence: Secondary | ICD-10-CM | POA: Diagnosis not present

## 2019-09-28 DIAGNOSIS — D631 Anemia in chronic kidney disease: Secondary | ICD-10-CM | POA: Diagnosis not present

## 2019-09-28 DIAGNOSIS — D509 Iron deficiency anemia, unspecified: Secondary | ICD-10-CM | POA: Diagnosis not present

## 2019-09-28 DIAGNOSIS — Z992 Dependence on renal dialysis: Secondary | ICD-10-CM | POA: Diagnosis not present

## 2019-09-28 DIAGNOSIS — N186 End stage renal disease: Secondary | ICD-10-CM | POA: Diagnosis not present

## 2019-09-28 DIAGNOSIS — N2581 Secondary hyperparathyroidism of renal origin: Secondary | ICD-10-CM | POA: Diagnosis not present

## 2019-09-28 DIAGNOSIS — T7840XA Allergy, unspecified, initial encounter: Secondary | ICD-10-CM | POA: Diagnosis not present

## 2019-09-30 DIAGNOSIS — N2581 Secondary hyperparathyroidism of renal origin: Secondary | ICD-10-CM | POA: Diagnosis not present

## 2019-09-30 DIAGNOSIS — D631 Anemia in chronic kidney disease: Secondary | ICD-10-CM | POA: Diagnosis not present

## 2019-09-30 DIAGNOSIS — D509 Iron deficiency anemia, unspecified: Secondary | ICD-10-CM | POA: Diagnosis not present

## 2019-09-30 DIAGNOSIS — N186 End stage renal disease: Secondary | ICD-10-CM | POA: Diagnosis not present

## 2019-09-30 DIAGNOSIS — T7840XA Allergy, unspecified, initial encounter: Secondary | ICD-10-CM | POA: Diagnosis not present

## 2019-09-30 DIAGNOSIS — Z992 Dependence on renal dialysis: Secondary | ICD-10-CM | POA: Diagnosis not present

## 2019-10-03 DIAGNOSIS — T7840XA Allergy, unspecified, initial encounter: Secondary | ICD-10-CM | POA: Diagnosis not present

## 2019-10-03 DIAGNOSIS — N186 End stage renal disease: Secondary | ICD-10-CM | POA: Diagnosis not present

## 2019-10-03 DIAGNOSIS — D509 Iron deficiency anemia, unspecified: Secondary | ICD-10-CM | POA: Diagnosis not present

## 2019-10-03 DIAGNOSIS — Z992 Dependence on renal dialysis: Secondary | ICD-10-CM | POA: Diagnosis not present

## 2019-10-03 DIAGNOSIS — N2581 Secondary hyperparathyroidism of renal origin: Secondary | ICD-10-CM | POA: Diagnosis not present

## 2019-10-03 DIAGNOSIS — D631 Anemia in chronic kidney disease: Secondary | ICD-10-CM | POA: Diagnosis not present

## 2019-10-05 DIAGNOSIS — N2581 Secondary hyperparathyroidism of renal origin: Secondary | ICD-10-CM | POA: Diagnosis not present

## 2019-10-05 DIAGNOSIS — D631 Anemia in chronic kidney disease: Secondary | ICD-10-CM | POA: Diagnosis not present

## 2019-10-05 DIAGNOSIS — D509 Iron deficiency anemia, unspecified: Secondary | ICD-10-CM | POA: Diagnosis not present

## 2019-10-05 DIAGNOSIS — N186 End stage renal disease: Secondary | ICD-10-CM | POA: Diagnosis not present

## 2019-10-05 DIAGNOSIS — T7840XA Allergy, unspecified, initial encounter: Secondary | ICD-10-CM | POA: Diagnosis not present

## 2019-10-05 DIAGNOSIS — Z992 Dependence on renal dialysis: Secondary | ICD-10-CM | POA: Diagnosis not present

## 2019-10-07 DIAGNOSIS — T7840XA Allergy, unspecified, initial encounter: Secondary | ICD-10-CM | POA: Diagnosis not present

## 2019-10-07 DIAGNOSIS — D631 Anemia in chronic kidney disease: Secondary | ICD-10-CM | POA: Diagnosis not present

## 2019-10-07 DIAGNOSIS — Z992 Dependence on renal dialysis: Secondary | ICD-10-CM | POA: Diagnosis not present

## 2019-10-07 DIAGNOSIS — N186 End stage renal disease: Secondary | ICD-10-CM | POA: Diagnosis not present

## 2019-10-07 DIAGNOSIS — N2581 Secondary hyperparathyroidism of renal origin: Secondary | ICD-10-CM | POA: Diagnosis not present

## 2019-10-07 DIAGNOSIS — D509 Iron deficiency anemia, unspecified: Secondary | ICD-10-CM | POA: Diagnosis not present

## 2019-10-10 DIAGNOSIS — N2581 Secondary hyperparathyroidism of renal origin: Secondary | ICD-10-CM | POA: Diagnosis not present

## 2019-10-10 DIAGNOSIS — D509 Iron deficiency anemia, unspecified: Secondary | ICD-10-CM | POA: Diagnosis not present

## 2019-10-10 DIAGNOSIS — T7840XA Allergy, unspecified, initial encounter: Secondary | ICD-10-CM | POA: Diagnosis not present

## 2019-10-10 DIAGNOSIS — D631 Anemia in chronic kidney disease: Secondary | ICD-10-CM | POA: Diagnosis not present

## 2019-10-10 DIAGNOSIS — Z992 Dependence on renal dialysis: Secondary | ICD-10-CM | POA: Diagnosis not present

## 2019-10-10 DIAGNOSIS — N186 End stage renal disease: Secondary | ICD-10-CM | POA: Diagnosis not present

## 2019-10-12 DIAGNOSIS — D631 Anemia in chronic kidney disease: Secondary | ICD-10-CM | POA: Diagnosis not present

## 2019-10-12 DIAGNOSIS — N186 End stage renal disease: Secondary | ICD-10-CM | POA: Diagnosis not present

## 2019-10-12 DIAGNOSIS — Z992 Dependence on renal dialysis: Secondary | ICD-10-CM | POA: Diagnosis not present

## 2019-10-12 DIAGNOSIS — D509 Iron deficiency anemia, unspecified: Secondary | ICD-10-CM | POA: Diagnosis not present

## 2019-10-12 DIAGNOSIS — N2581 Secondary hyperparathyroidism of renal origin: Secondary | ICD-10-CM | POA: Diagnosis not present

## 2019-10-12 DIAGNOSIS — T7840XA Allergy, unspecified, initial encounter: Secondary | ICD-10-CM | POA: Diagnosis not present

## 2019-10-13 DIAGNOSIS — Z23 Encounter for immunization: Secondary | ICD-10-CM | POA: Diagnosis not present

## 2019-10-14 DIAGNOSIS — N186 End stage renal disease: Secondary | ICD-10-CM | POA: Diagnosis not present

## 2019-10-14 DIAGNOSIS — D509 Iron deficiency anemia, unspecified: Secondary | ICD-10-CM | POA: Diagnosis not present

## 2019-10-14 DIAGNOSIS — N2581 Secondary hyperparathyroidism of renal origin: Secondary | ICD-10-CM | POA: Diagnosis not present

## 2019-10-14 DIAGNOSIS — D631 Anemia in chronic kidney disease: Secondary | ICD-10-CM | POA: Diagnosis not present

## 2019-10-14 DIAGNOSIS — T7840XA Allergy, unspecified, initial encounter: Secondary | ICD-10-CM | POA: Diagnosis not present

## 2019-10-14 DIAGNOSIS — Z992 Dependence on renal dialysis: Secondary | ICD-10-CM | POA: Diagnosis not present

## 2019-10-15 DIAGNOSIS — N186 End stage renal disease: Secondary | ICD-10-CM | POA: Diagnosis not present

## 2019-10-15 DIAGNOSIS — D509 Iron deficiency anemia, unspecified: Secondary | ICD-10-CM | POA: Diagnosis not present

## 2019-10-15 DIAGNOSIS — Z992 Dependence on renal dialysis: Secondary | ICD-10-CM | POA: Diagnosis not present

## 2019-10-15 DIAGNOSIS — D631 Anemia in chronic kidney disease: Secondary | ICD-10-CM | POA: Diagnosis not present

## 2019-10-15 DIAGNOSIS — N2581 Secondary hyperparathyroidism of renal origin: Secondary | ICD-10-CM | POA: Diagnosis not present

## 2019-10-15 DIAGNOSIS — T7840XA Allergy, unspecified, initial encounter: Secondary | ICD-10-CM | POA: Diagnosis not present

## 2019-10-17 DIAGNOSIS — T7840XA Allergy, unspecified, initial encounter: Secondary | ICD-10-CM | POA: Diagnosis not present

## 2019-10-17 DIAGNOSIS — D631 Anemia in chronic kidney disease: Secondary | ICD-10-CM | POA: Diagnosis not present

## 2019-10-17 DIAGNOSIS — N2581 Secondary hyperparathyroidism of renal origin: Secondary | ICD-10-CM | POA: Diagnosis not present

## 2019-10-17 DIAGNOSIS — N186 End stage renal disease: Secondary | ICD-10-CM | POA: Diagnosis not present

## 2019-10-17 DIAGNOSIS — Z992 Dependence on renal dialysis: Secondary | ICD-10-CM | POA: Diagnosis not present

## 2019-10-17 DIAGNOSIS — D509 Iron deficiency anemia, unspecified: Secondary | ICD-10-CM | POA: Diagnosis not present

## 2019-10-19 DIAGNOSIS — D631 Anemia in chronic kidney disease: Secondary | ICD-10-CM | POA: Diagnosis not present

## 2019-10-19 DIAGNOSIS — N2581 Secondary hyperparathyroidism of renal origin: Secondary | ICD-10-CM | POA: Diagnosis not present

## 2019-10-19 DIAGNOSIS — D509 Iron deficiency anemia, unspecified: Secondary | ICD-10-CM | POA: Diagnosis not present

## 2019-10-19 DIAGNOSIS — N186 End stage renal disease: Secondary | ICD-10-CM | POA: Diagnosis not present

## 2019-10-19 DIAGNOSIS — Z992 Dependence on renal dialysis: Secondary | ICD-10-CM | POA: Diagnosis not present

## 2019-10-19 DIAGNOSIS — T7840XA Allergy, unspecified, initial encounter: Secondary | ICD-10-CM | POA: Diagnosis not present

## 2019-10-21 DIAGNOSIS — Z992 Dependence on renal dialysis: Secondary | ICD-10-CM | POA: Diagnosis not present

## 2019-10-21 DIAGNOSIS — D631 Anemia in chronic kidney disease: Secondary | ICD-10-CM | POA: Diagnosis not present

## 2019-10-21 DIAGNOSIS — D509 Iron deficiency anemia, unspecified: Secondary | ICD-10-CM | POA: Diagnosis not present

## 2019-10-21 DIAGNOSIS — N186 End stage renal disease: Secondary | ICD-10-CM | POA: Diagnosis not present

## 2019-10-21 DIAGNOSIS — T7840XA Allergy, unspecified, initial encounter: Secondary | ICD-10-CM | POA: Diagnosis not present

## 2019-10-21 DIAGNOSIS — N2581 Secondary hyperparathyroidism of renal origin: Secondary | ICD-10-CM | POA: Diagnosis not present

## 2019-10-24 DIAGNOSIS — N2581 Secondary hyperparathyroidism of renal origin: Secondary | ICD-10-CM | POA: Diagnosis not present

## 2019-10-24 DIAGNOSIS — Z992 Dependence on renal dialysis: Secondary | ICD-10-CM | POA: Diagnosis not present

## 2019-10-24 DIAGNOSIS — D509 Iron deficiency anemia, unspecified: Secondary | ICD-10-CM | POA: Diagnosis not present

## 2019-10-24 DIAGNOSIS — N186 End stage renal disease: Secondary | ICD-10-CM | POA: Diagnosis not present

## 2019-10-24 DIAGNOSIS — T7840XA Allergy, unspecified, initial encounter: Secondary | ICD-10-CM | POA: Diagnosis not present

## 2019-10-24 DIAGNOSIS — D631 Anemia in chronic kidney disease: Secondary | ICD-10-CM | POA: Diagnosis not present

## 2019-10-26 DIAGNOSIS — Z992 Dependence on renal dialysis: Secondary | ICD-10-CM | POA: Diagnosis not present

## 2019-10-26 DIAGNOSIS — N186 End stage renal disease: Secondary | ICD-10-CM | POA: Diagnosis not present

## 2019-10-26 DIAGNOSIS — E119 Type 2 diabetes mellitus without complications: Secondary | ICD-10-CM | POA: Diagnosis not present

## 2019-10-26 DIAGNOSIS — D631 Anemia in chronic kidney disease: Secondary | ICD-10-CM | POA: Diagnosis not present

## 2019-10-26 DIAGNOSIS — N2581 Secondary hyperparathyroidism of renal origin: Secondary | ICD-10-CM | POA: Diagnosis not present

## 2019-10-26 DIAGNOSIS — T7840XA Allergy, unspecified, initial encounter: Secondary | ICD-10-CM | POA: Diagnosis not present

## 2019-10-26 DIAGNOSIS — D509 Iron deficiency anemia, unspecified: Secondary | ICD-10-CM | POA: Diagnosis not present

## 2019-10-28 DIAGNOSIS — D509 Iron deficiency anemia, unspecified: Secondary | ICD-10-CM | POA: Diagnosis not present

## 2019-10-28 DIAGNOSIS — Z992 Dependence on renal dialysis: Secondary | ICD-10-CM | POA: Diagnosis not present

## 2019-10-28 DIAGNOSIS — D631 Anemia in chronic kidney disease: Secondary | ICD-10-CM | POA: Diagnosis not present

## 2019-10-28 DIAGNOSIS — N2581 Secondary hyperparathyroidism of renal origin: Secondary | ICD-10-CM | POA: Diagnosis not present

## 2019-10-28 DIAGNOSIS — N186 End stage renal disease: Secondary | ICD-10-CM | POA: Diagnosis not present

## 2019-10-28 DIAGNOSIS — T7840XA Allergy, unspecified, initial encounter: Secondary | ICD-10-CM | POA: Diagnosis not present

## 2019-10-31 DIAGNOSIS — Z992 Dependence on renal dialysis: Secondary | ICD-10-CM | POA: Diagnosis not present

## 2019-10-31 DIAGNOSIS — N186 End stage renal disease: Secondary | ICD-10-CM | POA: Diagnosis not present

## 2019-10-31 DIAGNOSIS — N2581 Secondary hyperparathyroidism of renal origin: Secondary | ICD-10-CM | POA: Diagnosis not present

## 2019-10-31 DIAGNOSIS — D509 Iron deficiency anemia, unspecified: Secondary | ICD-10-CM | POA: Diagnosis not present

## 2019-10-31 DIAGNOSIS — T7840XA Allergy, unspecified, initial encounter: Secondary | ICD-10-CM | POA: Diagnosis not present

## 2019-10-31 DIAGNOSIS — D631 Anemia in chronic kidney disease: Secondary | ICD-10-CM | POA: Diagnosis not present

## 2019-11-01 DIAGNOSIS — E1142 Type 2 diabetes mellitus with diabetic polyneuropathy: Secondary | ICD-10-CM | POA: Diagnosis not present

## 2019-11-01 DIAGNOSIS — L03116 Cellulitis of left lower limb: Secondary | ICD-10-CM | POA: Diagnosis not present

## 2019-11-01 DIAGNOSIS — L97322 Non-pressure chronic ulcer of left ankle with fat layer exposed: Secondary | ICD-10-CM | POA: Diagnosis not present

## 2019-11-01 DIAGNOSIS — E11621 Type 2 diabetes mellitus with foot ulcer: Secondary | ICD-10-CM | POA: Diagnosis not present

## 2019-11-01 DIAGNOSIS — L97522 Non-pressure chronic ulcer of other part of left foot with fat layer exposed: Secondary | ICD-10-CM | POA: Diagnosis not present

## 2019-11-02 DIAGNOSIS — T7840XA Allergy, unspecified, initial encounter: Secondary | ICD-10-CM | POA: Diagnosis not present

## 2019-11-02 DIAGNOSIS — Z992 Dependence on renal dialysis: Secondary | ICD-10-CM | POA: Diagnosis not present

## 2019-11-02 DIAGNOSIS — N2581 Secondary hyperparathyroidism of renal origin: Secondary | ICD-10-CM | POA: Diagnosis not present

## 2019-11-02 DIAGNOSIS — D631 Anemia in chronic kidney disease: Secondary | ICD-10-CM | POA: Diagnosis not present

## 2019-11-02 DIAGNOSIS — D509 Iron deficiency anemia, unspecified: Secondary | ICD-10-CM | POA: Diagnosis not present

## 2019-11-02 DIAGNOSIS — N186 End stage renal disease: Secondary | ICD-10-CM | POA: Diagnosis not present

## 2019-11-03 DIAGNOSIS — Z23 Encounter for immunization: Secondary | ICD-10-CM | POA: Diagnosis not present

## 2019-11-04 DIAGNOSIS — N186 End stage renal disease: Secondary | ICD-10-CM | POA: Diagnosis not present

## 2019-11-04 DIAGNOSIS — N2581 Secondary hyperparathyroidism of renal origin: Secondary | ICD-10-CM | POA: Diagnosis not present

## 2019-11-04 DIAGNOSIS — Z992 Dependence on renal dialysis: Secondary | ICD-10-CM | POA: Diagnosis not present

## 2019-11-04 DIAGNOSIS — D631 Anemia in chronic kidney disease: Secondary | ICD-10-CM | POA: Diagnosis not present

## 2019-11-04 DIAGNOSIS — T7840XA Allergy, unspecified, initial encounter: Secondary | ICD-10-CM | POA: Diagnosis not present

## 2019-11-04 DIAGNOSIS — D509 Iron deficiency anemia, unspecified: Secondary | ICD-10-CM | POA: Diagnosis not present

## 2019-11-07 DIAGNOSIS — D509 Iron deficiency anemia, unspecified: Secondary | ICD-10-CM | POA: Diagnosis not present

## 2019-11-07 DIAGNOSIS — Z992 Dependence on renal dialysis: Secondary | ICD-10-CM | POA: Diagnosis not present

## 2019-11-07 DIAGNOSIS — N186 End stage renal disease: Secondary | ICD-10-CM | POA: Diagnosis not present

## 2019-11-07 DIAGNOSIS — N2581 Secondary hyperparathyroidism of renal origin: Secondary | ICD-10-CM | POA: Diagnosis not present

## 2019-11-07 DIAGNOSIS — T7840XA Allergy, unspecified, initial encounter: Secondary | ICD-10-CM | POA: Diagnosis not present

## 2019-11-07 DIAGNOSIS — D631 Anemia in chronic kidney disease: Secondary | ICD-10-CM | POA: Diagnosis not present

## 2019-11-09 DIAGNOSIS — D631 Anemia in chronic kidney disease: Secondary | ICD-10-CM | POA: Diagnosis not present

## 2019-11-09 DIAGNOSIS — T7840XA Allergy, unspecified, initial encounter: Secondary | ICD-10-CM | POA: Diagnosis not present

## 2019-11-09 DIAGNOSIS — Z992 Dependence on renal dialysis: Secondary | ICD-10-CM | POA: Diagnosis not present

## 2019-11-09 DIAGNOSIS — N186 End stage renal disease: Secondary | ICD-10-CM | POA: Diagnosis not present

## 2019-11-09 DIAGNOSIS — N2581 Secondary hyperparathyroidism of renal origin: Secondary | ICD-10-CM | POA: Diagnosis not present

## 2019-11-09 DIAGNOSIS — D509 Iron deficiency anemia, unspecified: Secondary | ICD-10-CM | POA: Diagnosis not present

## 2019-11-11 DIAGNOSIS — D631 Anemia in chronic kidney disease: Secondary | ICD-10-CM | POA: Diagnosis not present

## 2019-11-11 DIAGNOSIS — N186 End stage renal disease: Secondary | ICD-10-CM | POA: Diagnosis not present

## 2019-11-11 DIAGNOSIS — Z992 Dependence on renal dialysis: Secondary | ICD-10-CM | POA: Diagnosis not present

## 2019-11-11 DIAGNOSIS — N2581 Secondary hyperparathyroidism of renal origin: Secondary | ICD-10-CM | POA: Diagnosis not present

## 2019-11-11 DIAGNOSIS — D509 Iron deficiency anemia, unspecified: Secondary | ICD-10-CM | POA: Diagnosis not present

## 2019-11-11 DIAGNOSIS — T7840XA Allergy, unspecified, initial encounter: Secondary | ICD-10-CM | POA: Diagnosis not present

## 2019-11-14 DIAGNOSIS — N186 End stage renal disease: Secondary | ICD-10-CM | POA: Diagnosis not present

## 2019-11-14 DIAGNOSIS — Z992 Dependence on renal dialysis: Secondary | ICD-10-CM | POA: Diagnosis not present

## 2019-11-14 DIAGNOSIS — D631 Anemia in chronic kidney disease: Secondary | ICD-10-CM | POA: Diagnosis not present

## 2019-11-14 DIAGNOSIS — D509 Iron deficiency anemia, unspecified: Secondary | ICD-10-CM | POA: Diagnosis not present

## 2019-11-14 DIAGNOSIS — N2581 Secondary hyperparathyroidism of renal origin: Secondary | ICD-10-CM | POA: Diagnosis not present

## 2019-11-14 DIAGNOSIS — T7840XA Allergy, unspecified, initial encounter: Secondary | ICD-10-CM | POA: Diagnosis not present

## 2019-11-16 DIAGNOSIS — N186 End stage renal disease: Secondary | ICD-10-CM | POA: Diagnosis not present

## 2019-11-16 DIAGNOSIS — T7840XA Allergy, unspecified, initial encounter: Secondary | ICD-10-CM | POA: Diagnosis not present

## 2019-11-16 DIAGNOSIS — N2581 Secondary hyperparathyroidism of renal origin: Secondary | ICD-10-CM | POA: Diagnosis not present

## 2019-11-16 DIAGNOSIS — Z992 Dependence on renal dialysis: Secondary | ICD-10-CM | POA: Diagnosis not present

## 2019-11-16 DIAGNOSIS — D631 Anemia in chronic kidney disease: Secondary | ICD-10-CM | POA: Diagnosis not present

## 2019-11-16 DIAGNOSIS — D509 Iron deficiency anemia, unspecified: Secondary | ICD-10-CM | POA: Diagnosis not present

## 2019-11-18 DIAGNOSIS — N2581 Secondary hyperparathyroidism of renal origin: Secondary | ICD-10-CM | POA: Diagnosis not present

## 2019-11-18 DIAGNOSIS — N186 End stage renal disease: Secondary | ICD-10-CM | POA: Diagnosis not present

## 2019-11-18 DIAGNOSIS — D631 Anemia in chronic kidney disease: Secondary | ICD-10-CM | POA: Diagnosis not present

## 2019-11-18 DIAGNOSIS — T7840XA Allergy, unspecified, initial encounter: Secondary | ICD-10-CM | POA: Diagnosis not present

## 2019-11-18 DIAGNOSIS — D509 Iron deficiency anemia, unspecified: Secondary | ICD-10-CM | POA: Diagnosis not present

## 2019-11-18 DIAGNOSIS — Z992 Dependence on renal dialysis: Secondary | ICD-10-CM | POA: Diagnosis not present

## 2019-11-21 DIAGNOSIS — T7840XA Allergy, unspecified, initial encounter: Secondary | ICD-10-CM | POA: Diagnosis not present

## 2019-11-21 DIAGNOSIS — Z992 Dependence on renal dialysis: Secondary | ICD-10-CM | POA: Diagnosis not present

## 2019-11-21 DIAGNOSIS — D509 Iron deficiency anemia, unspecified: Secondary | ICD-10-CM | POA: Diagnosis not present

## 2019-11-21 DIAGNOSIS — N186 End stage renal disease: Secondary | ICD-10-CM | POA: Diagnosis not present

## 2019-11-21 DIAGNOSIS — N2581 Secondary hyperparathyroidism of renal origin: Secondary | ICD-10-CM | POA: Diagnosis not present

## 2019-11-21 DIAGNOSIS — D631 Anemia in chronic kidney disease: Secondary | ICD-10-CM | POA: Diagnosis not present

## 2019-11-23 DIAGNOSIS — N186 End stage renal disease: Secondary | ICD-10-CM | POA: Diagnosis not present

## 2019-11-23 DIAGNOSIS — D509 Iron deficiency anemia, unspecified: Secondary | ICD-10-CM | POA: Diagnosis not present

## 2019-11-23 DIAGNOSIS — N2581 Secondary hyperparathyroidism of renal origin: Secondary | ICD-10-CM | POA: Diagnosis not present

## 2019-11-23 DIAGNOSIS — D631 Anemia in chronic kidney disease: Secondary | ICD-10-CM | POA: Diagnosis not present

## 2019-11-23 DIAGNOSIS — Z992 Dependence on renal dialysis: Secondary | ICD-10-CM | POA: Diagnosis not present

## 2019-11-23 DIAGNOSIS — T7840XA Allergy, unspecified, initial encounter: Secondary | ICD-10-CM | POA: Diagnosis not present

## 2019-11-24 DIAGNOSIS — Z992 Dependence on renal dialysis: Secondary | ICD-10-CM | POA: Diagnosis not present

## 2019-11-24 DIAGNOSIS — N186 End stage renal disease: Secondary | ICD-10-CM | POA: Diagnosis not present

## 2019-11-25 DIAGNOSIS — D631 Anemia in chronic kidney disease: Secondary | ICD-10-CM | POA: Diagnosis not present

## 2019-11-25 DIAGNOSIS — N186 End stage renal disease: Secondary | ICD-10-CM | POA: Diagnosis not present

## 2019-11-25 DIAGNOSIS — T7840XA Allergy, unspecified, initial encounter: Secondary | ICD-10-CM | POA: Diagnosis not present

## 2019-11-25 DIAGNOSIS — D509 Iron deficiency anemia, unspecified: Secondary | ICD-10-CM | POA: Diagnosis not present

## 2019-11-25 DIAGNOSIS — Z992 Dependence on renal dialysis: Secondary | ICD-10-CM | POA: Diagnosis not present

## 2019-11-25 DIAGNOSIS — N2581 Secondary hyperparathyroidism of renal origin: Secondary | ICD-10-CM | POA: Diagnosis not present

## 2019-11-28 DIAGNOSIS — D509 Iron deficiency anemia, unspecified: Secondary | ICD-10-CM | POA: Diagnosis not present

## 2019-11-28 DIAGNOSIS — Z992 Dependence on renal dialysis: Secondary | ICD-10-CM | POA: Diagnosis not present

## 2019-11-28 DIAGNOSIS — N186 End stage renal disease: Secondary | ICD-10-CM | POA: Diagnosis not present

## 2019-11-28 DIAGNOSIS — N2581 Secondary hyperparathyroidism of renal origin: Secondary | ICD-10-CM | POA: Diagnosis not present

## 2019-11-28 DIAGNOSIS — D631 Anemia in chronic kidney disease: Secondary | ICD-10-CM | POA: Diagnosis not present

## 2019-11-28 DIAGNOSIS — T7840XA Allergy, unspecified, initial encounter: Secondary | ICD-10-CM | POA: Diagnosis not present

## 2019-11-30 DIAGNOSIS — N2581 Secondary hyperparathyroidism of renal origin: Secondary | ICD-10-CM | POA: Diagnosis not present

## 2019-11-30 DIAGNOSIS — Z992 Dependence on renal dialysis: Secondary | ICD-10-CM | POA: Diagnosis not present

## 2019-11-30 DIAGNOSIS — D631 Anemia in chronic kidney disease: Secondary | ICD-10-CM | POA: Diagnosis not present

## 2019-11-30 DIAGNOSIS — T7840XA Allergy, unspecified, initial encounter: Secondary | ICD-10-CM | POA: Diagnosis not present

## 2019-11-30 DIAGNOSIS — N186 End stage renal disease: Secondary | ICD-10-CM | POA: Diagnosis not present

## 2019-11-30 DIAGNOSIS — D509 Iron deficiency anemia, unspecified: Secondary | ICD-10-CM | POA: Diagnosis not present

## 2019-12-02 DIAGNOSIS — N2581 Secondary hyperparathyroidism of renal origin: Secondary | ICD-10-CM | POA: Diagnosis not present

## 2019-12-02 DIAGNOSIS — D631 Anemia in chronic kidney disease: Secondary | ICD-10-CM | POA: Diagnosis not present

## 2019-12-02 DIAGNOSIS — Z992 Dependence on renal dialysis: Secondary | ICD-10-CM | POA: Diagnosis not present

## 2019-12-02 DIAGNOSIS — T7840XA Allergy, unspecified, initial encounter: Secondary | ICD-10-CM | POA: Diagnosis not present

## 2019-12-02 DIAGNOSIS — D509 Iron deficiency anemia, unspecified: Secondary | ICD-10-CM | POA: Diagnosis not present

## 2019-12-02 DIAGNOSIS — N186 End stage renal disease: Secondary | ICD-10-CM | POA: Diagnosis not present

## 2019-12-05 DIAGNOSIS — T7840XA Allergy, unspecified, initial encounter: Secondary | ICD-10-CM | POA: Diagnosis not present

## 2019-12-05 DIAGNOSIS — Z992 Dependence on renal dialysis: Secondary | ICD-10-CM | POA: Diagnosis not present

## 2019-12-05 DIAGNOSIS — D509 Iron deficiency anemia, unspecified: Secondary | ICD-10-CM | POA: Diagnosis not present

## 2019-12-05 DIAGNOSIS — D631 Anemia in chronic kidney disease: Secondary | ICD-10-CM | POA: Diagnosis not present

## 2019-12-05 DIAGNOSIS — N2581 Secondary hyperparathyroidism of renal origin: Secondary | ICD-10-CM | POA: Diagnosis not present

## 2019-12-05 DIAGNOSIS — N186 End stage renal disease: Secondary | ICD-10-CM | POA: Diagnosis not present

## 2019-12-07 DIAGNOSIS — D631 Anemia in chronic kidney disease: Secondary | ICD-10-CM | POA: Diagnosis not present

## 2019-12-07 DIAGNOSIS — N186 End stage renal disease: Secondary | ICD-10-CM | POA: Diagnosis not present

## 2019-12-07 DIAGNOSIS — N2581 Secondary hyperparathyroidism of renal origin: Secondary | ICD-10-CM | POA: Diagnosis not present

## 2019-12-07 DIAGNOSIS — D509 Iron deficiency anemia, unspecified: Secondary | ICD-10-CM | POA: Diagnosis not present

## 2019-12-07 DIAGNOSIS — T7840XA Allergy, unspecified, initial encounter: Secondary | ICD-10-CM | POA: Diagnosis not present

## 2019-12-07 DIAGNOSIS — Z992 Dependence on renal dialysis: Secondary | ICD-10-CM | POA: Diagnosis not present

## 2019-12-09 DIAGNOSIS — T7840XA Allergy, unspecified, initial encounter: Secondary | ICD-10-CM | POA: Diagnosis not present

## 2019-12-09 DIAGNOSIS — N186 End stage renal disease: Secondary | ICD-10-CM | POA: Diagnosis not present

## 2019-12-09 DIAGNOSIS — N2581 Secondary hyperparathyroidism of renal origin: Secondary | ICD-10-CM | POA: Diagnosis not present

## 2019-12-09 DIAGNOSIS — D509 Iron deficiency anemia, unspecified: Secondary | ICD-10-CM | POA: Diagnosis not present

## 2019-12-09 DIAGNOSIS — D631 Anemia in chronic kidney disease: Secondary | ICD-10-CM | POA: Diagnosis not present

## 2019-12-09 DIAGNOSIS — Z992 Dependence on renal dialysis: Secondary | ICD-10-CM | POA: Diagnosis not present

## 2019-12-12 DIAGNOSIS — T7840XA Allergy, unspecified, initial encounter: Secondary | ICD-10-CM | POA: Diagnosis not present

## 2019-12-12 DIAGNOSIS — D509 Iron deficiency anemia, unspecified: Secondary | ICD-10-CM | POA: Diagnosis not present

## 2019-12-12 DIAGNOSIS — D631 Anemia in chronic kidney disease: Secondary | ICD-10-CM | POA: Diagnosis not present

## 2019-12-12 DIAGNOSIS — Z992 Dependence on renal dialysis: Secondary | ICD-10-CM | POA: Diagnosis not present

## 2019-12-12 DIAGNOSIS — N2581 Secondary hyperparathyroidism of renal origin: Secondary | ICD-10-CM | POA: Diagnosis not present

## 2019-12-12 DIAGNOSIS — N186 End stage renal disease: Secondary | ICD-10-CM | POA: Diagnosis not present

## 2019-12-14 DIAGNOSIS — D509 Iron deficiency anemia, unspecified: Secondary | ICD-10-CM | POA: Diagnosis not present

## 2019-12-14 DIAGNOSIS — D631 Anemia in chronic kidney disease: Secondary | ICD-10-CM | POA: Diagnosis not present

## 2019-12-14 DIAGNOSIS — Z992 Dependence on renal dialysis: Secondary | ICD-10-CM | POA: Diagnosis not present

## 2019-12-14 DIAGNOSIS — N186 End stage renal disease: Secondary | ICD-10-CM | POA: Diagnosis not present

## 2019-12-14 DIAGNOSIS — T7840XA Allergy, unspecified, initial encounter: Secondary | ICD-10-CM | POA: Diagnosis not present

## 2019-12-14 DIAGNOSIS — N2581 Secondary hyperparathyroidism of renal origin: Secondary | ICD-10-CM | POA: Diagnosis not present

## 2019-12-16 DIAGNOSIS — D509 Iron deficiency anemia, unspecified: Secondary | ICD-10-CM | POA: Diagnosis not present

## 2019-12-16 DIAGNOSIS — Z992 Dependence on renal dialysis: Secondary | ICD-10-CM | POA: Diagnosis not present

## 2019-12-16 DIAGNOSIS — D631 Anemia in chronic kidney disease: Secondary | ICD-10-CM | POA: Diagnosis not present

## 2019-12-16 DIAGNOSIS — N186 End stage renal disease: Secondary | ICD-10-CM | POA: Diagnosis not present

## 2019-12-16 DIAGNOSIS — N2581 Secondary hyperparathyroidism of renal origin: Secondary | ICD-10-CM | POA: Diagnosis not present

## 2019-12-16 DIAGNOSIS — T7840XA Allergy, unspecified, initial encounter: Secondary | ICD-10-CM | POA: Diagnosis not present

## 2019-12-19 DIAGNOSIS — D631 Anemia in chronic kidney disease: Secondary | ICD-10-CM | POA: Diagnosis not present

## 2019-12-19 DIAGNOSIS — Z992 Dependence on renal dialysis: Secondary | ICD-10-CM | POA: Diagnosis not present

## 2019-12-19 DIAGNOSIS — N186 End stage renal disease: Secondary | ICD-10-CM | POA: Diagnosis not present

## 2019-12-19 DIAGNOSIS — D509 Iron deficiency anemia, unspecified: Secondary | ICD-10-CM | POA: Diagnosis not present

## 2019-12-19 DIAGNOSIS — N2581 Secondary hyperparathyroidism of renal origin: Secondary | ICD-10-CM | POA: Diagnosis not present

## 2019-12-19 DIAGNOSIS — T7840XA Allergy, unspecified, initial encounter: Secondary | ICD-10-CM | POA: Diagnosis not present

## 2019-12-21 DIAGNOSIS — L97512 Non-pressure chronic ulcer of other part of right foot with fat layer exposed: Secondary | ICD-10-CM | POA: Diagnosis not present

## 2019-12-21 DIAGNOSIS — T7840XA Allergy, unspecified, initial encounter: Secondary | ICD-10-CM | POA: Diagnosis not present

## 2019-12-21 DIAGNOSIS — L97322 Non-pressure chronic ulcer of left ankle with fat layer exposed: Secondary | ICD-10-CM | POA: Diagnosis not present

## 2019-12-21 DIAGNOSIS — L97312 Non-pressure chronic ulcer of right ankle with fat layer exposed: Secondary | ICD-10-CM | POA: Diagnosis not present

## 2019-12-21 DIAGNOSIS — L97522 Non-pressure chronic ulcer of other part of left foot with fat layer exposed: Secondary | ICD-10-CM | POA: Diagnosis not present

## 2019-12-21 DIAGNOSIS — D509 Iron deficiency anemia, unspecified: Secondary | ICD-10-CM | POA: Diagnosis not present

## 2019-12-21 DIAGNOSIS — Z992 Dependence on renal dialysis: Secondary | ICD-10-CM | POA: Diagnosis not present

## 2019-12-21 DIAGNOSIS — N186 End stage renal disease: Secondary | ICD-10-CM | POA: Diagnosis not present

## 2019-12-21 DIAGNOSIS — N2581 Secondary hyperparathyroidism of renal origin: Secondary | ICD-10-CM | POA: Diagnosis not present

## 2019-12-21 DIAGNOSIS — E1142 Type 2 diabetes mellitus with diabetic polyneuropathy: Secondary | ICD-10-CM | POA: Diagnosis not present

## 2019-12-21 DIAGNOSIS — D631 Anemia in chronic kidney disease: Secondary | ICD-10-CM | POA: Diagnosis not present

## 2019-12-21 DIAGNOSIS — L03116 Cellulitis of left lower limb: Secondary | ICD-10-CM | POA: Diagnosis not present

## 2019-12-21 DIAGNOSIS — E11621 Type 2 diabetes mellitus with foot ulcer: Secondary | ICD-10-CM | POA: Diagnosis not present

## 2019-12-23 DIAGNOSIS — T7840XA Allergy, unspecified, initial encounter: Secondary | ICD-10-CM | POA: Diagnosis not present

## 2019-12-23 DIAGNOSIS — Z992 Dependence on renal dialysis: Secondary | ICD-10-CM | POA: Diagnosis not present

## 2019-12-23 DIAGNOSIS — D631 Anemia in chronic kidney disease: Secondary | ICD-10-CM | POA: Diagnosis not present

## 2019-12-23 DIAGNOSIS — D509 Iron deficiency anemia, unspecified: Secondary | ICD-10-CM | POA: Diagnosis not present

## 2019-12-23 DIAGNOSIS — N186 End stage renal disease: Secondary | ICD-10-CM | POA: Diagnosis not present

## 2019-12-23 DIAGNOSIS — N2581 Secondary hyperparathyroidism of renal origin: Secondary | ICD-10-CM | POA: Diagnosis not present

## 2019-12-25 DIAGNOSIS — N186 End stage renal disease: Secondary | ICD-10-CM | POA: Diagnosis not present

## 2019-12-25 DIAGNOSIS — Z992 Dependence on renal dialysis: Secondary | ICD-10-CM | POA: Diagnosis not present

## 2019-12-26 DIAGNOSIS — D509 Iron deficiency anemia, unspecified: Secondary | ICD-10-CM | POA: Diagnosis not present

## 2019-12-26 DIAGNOSIS — T7840XA Allergy, unspecified, initial encounter: Secondary | ICD-10-CM | POA: Diagnosis not present

## 2019-12-26 DIAGNOSIS — D631 Anemia in chronic kidney disease: Secondary | ICD-10-CM | POA: Diagnosis not present

## 2019-12-26 DIAGNOSIS — Z992 Dependence on renal dialysis: Secondary | ICD-10-CM | POA: Diagnosis not present

## 2019-12-26 DIAGNOSIS — N2581 Secondary hyperparathyroidism of renal origin: Secondary | ICD-10-CM | POA: Diagnosis not present

## 2019-12-26 DIAGNOSIS — N186 End stage renal disease: Secondary | ICD-10-CM | POA: Diagnosis not present

## 2019-12-27 DIAGNOSIS — L03116 Cellulitis of left lower limb: Secondary | ICD-10-CM | POA: Diagnosis not present

## 2019-12-27 DIAGNOSIS — L97512 Non-pressure chronic ulcer of other part of right foot with fat layer exposed: Secondary | ICD-10-CM | POA: Diagnosis not present

## 2019-12-27 DIAGNOSIS — L97322 Non-pressure chronic ulcer of left ankle with fat layer exposed: Secondary | ICD-10-CM | POA: Diagnosis not present

## 2019-12-27 DIAGNOSIS — L97312 Non-pressure chronic ulcer of right ankle with fat layer exposed: Secondary | ICD-10-CM | POA: Diagnosis not present

## 2019-12-27 DIAGNOSIS — E11621 Type 2 diabetes mellitus with foot ulcer: Secondary | ICD-10-CM | POA: Diagnosis not present

## 2019-12-27 DIAGNOSIS — L97522 Non-pressure chronic ulcer of other part of left foot with fat layer exposed: Secondary | ICD-10-CM | POA: Diagnosis not present

## 2019-12-27 DIAGNOSIS — E1142 Type 2 diabetes mellitus with diabetic polyneuropathy: Secondary | ICD-10-CM | POA: Diagnosis not present

## 2019-12-28 DIAGNOSIS — N2581 Secondary hyperparathyroidism of renal origin: Secondary | ICD-10-CM | POA: Diagnosis not present

## 2019-12-28 DIAGNOSIS — N186 End stage renal disease: Secondary | ICD-10-CM | POA: Diagnosis not present

## 2019-12-28 DIAGNOSIS — D631 Anemia in chronic kidney disease: Secondary | ICD-10-CM | POA: Diagnosis not present

## 2019-12-28 DIAGNOSIS — D509 Iron deficiency anemia, unspecified: Secondary | ICD-10-CM | POA: Diagnosis not present

## 2019-12-28 DIAGNOSIS — Z992 Dependence on renal dialysis: Secondary | ICD-10-CM | POA: Diagnosis not present

## 2019-12-28 DIAGNOSIS — T7840XA Allergy, unspecified, initial encounter: Secondary | ICD-10-CM | POA: Diagnosis not present

## 2019-12-30 DIAGNOSIS — T7840XA Allergy, unspecified, initial encounter: Secondary | ICD-10-CM | POA: Diagnosis not present

## 2019-12-30 DIAGNOSIS — N186 End stage renal disease: Secondary | ICD-10-CM | POA: Diagnosis not present

## 2019-12-30 DIAGNOSIS — D509 Iron deficiency anemia, unspecified: Secondary | ICD-10-CM | POA: Diagnosis not present

## 2019-12-30 DIAGNOSIS — Z992 Dependence on renal dialysis: Secondary | ICD-10-CM | POA: Diagnosis not present

## 2019-12-30 DIAGNOSIS — N2581 Secondary hyperparathyroidism of renal origin: Secondary | ICD-10-CM | POA: Diagnosis not present

## 2019-12-30 DIAGNOSIS — D631 Anemia in chronic kidney disease: Secondary | ICD-10-CM | POA: Diagnosis not present

## 2020-01-02 DIAGNOSIS — N186 End stage renal disease: Secondary | ICD-10-CM | POA: Diagnosis not present

## 2020-01-02 DIAGNOSIS — D509 Iron deficiency anemia, unspecified: Secondary | ICD-10-CM | POA: Diagnosis not present

## 2020-01-02 DIAGNOSIS — D631 Anemia in chronic kidney disease: Secondary | ICD-10-CM | POA: Diagnosis not present

## 2020-01-02 DIAGNOSIS — Z992 Dependence on renal dialysis: Secondary | ICD-10-CM | POA: Diagnosis not present

## 2020-01-02 DIAGNOSIS — T7840XA Allergy, unspecified, initial encounter: Secondary | ICD-10-CM | POA: Diagnosis not present

## 2020-01-02 DIAGNOSIS — N2581 Secondary hyperparathyroidism of renal origin: Secondary | ICD-10-CM | POA: Diagnosis not present

## 2020-01-03 DIAGNOSIS — Z01812 Encounter for preprocedural laboratory examination: Secondary | ICD-10-CM | POA: Diagnosis not present

## 2020-01-03 DIAGNOSIS — Z01810 Encounter for preprocedural cardiovascular examination: Secondary | ICD-10-CM | POA: Diagnosis not present

## 2020-01-03 DIAGNOSIS — I739 Peripheral vascular disease, unspecified: Secondary | ICD-10-CM | POA: Diagnosis not present

## 2020-01-04 DIAGNOSIS — N2581 Secondary hyperparathyroidism of renal origin: Secondary | ICD-10-CM | POA: Diagnosis not present

## 2020-01-04 DIAGNOSIS — E11621 Type 2 diabetes mellitus with foot ulcer: Secondary | ICD-10-CM | POA: Diagnosis not present

## 2020-01-04 DIAGNOSIS — L03116 Cellulitis of left lower limb: Secondary | ICD-10-CM | POA: Diagnosis not present

## 2020-01-04 DIAGNOSIS — E1142 Type 2 diabetes mellitus with diabetic polyneuropathy: Secondary | ICD-10-CM | POA: Diagnosis not present

## 2020-01-04 DIAGNOSIS — D509 Iron deficiency anemia, unspecified: Secondary | ICD-10-CM | POA: Diagnosis not present

## 2020-01-04 DIAGNOSIS — L97512 Non-pressure chronic ulcer of other part of right foot with fat layer exposed: Secondary | ICD-10-CM | POA: Diagnosis not present

## 2020-01-04 DIAGNOSIS — T7840XA Allergy, unspecified, initial encounter: Secondary | ICD-10-CM | POA: Diagnosis not present

## 2020-01-04 DIAGNOSIS — L97322 Non-pressure chronic ulcer of left ankle with fat layer exposed: Secondary | ICD-10-CM | POA: Diagnosis not present

## 2020-01-04 DIAGNOSIS — N186 End stage renal disease: Secondary | ICD-10-CM | POA: Diagnosis not present

## 2020-01-04 DIAGNOSIS — L97522 Non-pressure chronic ulcer of other part of left foot with fat layer exposed: Secondary | ICD-10-CM | POA: Diagnosis not present

## 2020-01-04 DIAGNOSIS — L97312 Non-pressure chronic ulcer of right ankle with fat layer exposed: Secondary | ICD-10-CM | POA: Diagnosis not present

## 2020-01-04 DIAGNOSIS — Z992 Dependence on renal dialysis: Secondary | ICD-10-CM | POA: Diagnosis not present

## 2020-01-04 DIAGNOSIS — D631 Anemia in chronic kidney disease: Secondary | ICD-10-CM | POA: Diagnosis not present

## 2020-01-06 DIAGNOSIS — T7840XA Allergy, unspecified, initial encounter: Secondary | ICD-10-CM | POA: Diagnosis not present

## 2020-01-06 DIAGNOSIS — N186 End stage renal disease: Secondary | ICD-10-CM | POA: Diagnosis not present

## 2020-01-06 DIAGNOSIS — N2581 Secondary hyperparathyroidism of renal origin: Secondary | ICD-10-CM | POA: Diagnosis not present

## 2020-01-06 DIAGNOSIS — Z992 Dependence on renal dialysis: Secondary | ICD-10-CM | POA: Diagnosis not present

## 2020-01-06 DIAGNOSIS — D631 Anemia in chronic kidney disease: Secondary | ICD-10-CM | POA: Diagnosis not present

## 2020-01-06 DIAGNOSIS — D509 Iron deficiency anemia, unspecified: Secondary | ICD-10-CM | POA: Diagnosis not present

## 2020-01-11 DIAGNOSIS — R52 Pain, unspecified: Secondary | ICD-10-CM | POA: Diagnosis not present

## 2020-01-11 DIAGNOSIS — R404 Transient alteration of awareness: Secondary | ICD-10-CM | POA: Diagnosis not present

## 2020-01-11 DIAGNOSIS — E161 Other hypoglycemia: Secondary | ICD-10-CM | POA: Diagnosis not present

## 2020-01-11 DIAGNOSIS — R4020 Unspecified coma: Secondary | ICD-10-CM | POA: Diagnosis not present

## 2020-01-11 DIAGNOSIS — T7840XA Allergy, unspecified, initial encounter: Secondary | ICD-10-CM | POA: Diagnosis not present

## 2020-01-11 DIAGNOSIS — N2581 Secondary hyperparathyroidism of renal origin: Secondary | ICD-10-CM | POA: Diagnosis not present

## 2020-01-11 DIAGNOSIS — E11621 Type 2 diabetes mellitus with foot ulcer: Secondary | ICD-10-CM | POA: Diagnosis not present

## 2020-01-11 DIAGNOSIS — L97509 Non-pressure chronic ulcer of other part of unspecified foot with unspecified severity: Secondary | ICD-10-CM | POA: Diagnosis not present

## 2020-01-11 DIAGNOSIS — Z6841 Body Mass Index (BMI) 40.0 and over, adult: Secondary | ICD-10-CM | POA: Diagnosis not present

## 2020-01-11 DIAGNOSIS — L97422 Non-pressure chronic ulcer of left heel and midfoot with fat layer exposed: Secondary | ICD-10-CM | POA: Diagnosis not present

## 2020-01-11 DIAGNOSIS — D509 Iron deficiency anemia, unspecified: Secondary | ICD-10-CM | POA: Diagnosis not present

## 2020-01-11 DIAGNOSIS — L97412 Non-pressure chronic ulcer of right heel and midfoot with fat layer exposed: Secondary | ICD-10-CM | POA: Diagnosis not present

## 2020-01-11 DIAGNOSIS — R0602 Shortness of breath: Secondary | ICD-10-CM | POA: Diagnosis not present

## 2020-01-11 DIAGNOSIS — I5022 Chronic systolic (congestive) heart failure: Secondary | ICD-10-CM | POA: Diagnosis not present

## 2020-01-11 DIAGNOSIS — N186 End stage renal disease: Secondary | ICD-10-CM | POA: Diagnosis not present

## 2020-01-11 DIAGNOSIS — Z992 Dependence on renal dialysis: Secondary | ICD-10-CM | POA: Diagnosis not present

## 2020-01-11 DIAGNOSIS — E162 Hypoglycemia, unspecified: Secondary | ICD-10-CM | POA: Diagnosis not present

## 2020-01-11 DIAGNOSIS — I132 Hypertensive heart and chronic kidney disease with heart failure and with stage 5 chronic kidney disease, or end stage renal disease: Secondary | ICD-10-CM | POA: Diagnosis not present

## 2020-01-11 DIAGNOSIS — I517 Cardiomegaly: Secondary | ICD-10-CM | POA: Diagnosis not present

## 2020-01-11 DIAGNOSIS — D631 Anemia in chronic kidney disease: Secondary | ICD-10-CM | POA: Diagnosis not present

## 2020-01-12 DIAGNOSIS — L97522 Non-pressure chronic ulcer of other part of left foot with fat layer exposed: Secondary | ICD-10-CM | POA: Diagnosis not present

## 2020-01-12 DIAGNOSIS — N186 End stage renal disease: Secondary | ICD-10-CM | POA: Diagnosis not present

## 2020-01-12 DIAGNOSIS — E1142 Type 2 diabetes mellitus with diabetic polyneuropathy: Secondary | ICD-10-CM | POA: Diagnosis not present

## 2020-01-12 DIAGNOSIS — L03116 Cellulitis of left lower limb: Secondary | ICD-10-CM | POA: Diagnosis not present

## 2020-01-12 DIAGNOSIS — E11621 Type 2 diabetes mellitus with foot ulcer: Secondary | ICD-10-CM | POA: Diagnosis not present

## 2020-01-12 DIAGNOSIS — L97512 Non-pressure chronic ulcer of other part of right foot with fat layer exposed: Secondary | ICD-10-CM | POA: Diagnosis not present

## 2020-01-12 DIAGNOSIS — E162 Hypoglycemia, unspecified: Secondary | ICD-10-CM | POA: Diagnosis not present

## 2020-01-12 DIAGNOSIS — L97529 Non-pressure chronic ulcer of other part of left foot with unspecified severity: Secondary | ICD-10-CM | POA: Diagnosis not present

## 2020-01-12 DIAGNOSIS — M19071 Primary osteoarthritis, right ankle and foot: Secondary | ICD-10-CM | POA: Diagnosis not present

## 2020-01-12 DIAGNOSIS — L97322 Non-pressure chronic ulcer of left ankle with fat layer exposed: Secondary | ICD-10-CM | POA: Diagnosis not present

## 2020-01-12 DIAGNOSIS — D631 Anemia in chronic kidney disease: Secondary | ICD-10-CM | POA: Diagnosis not present

## 2020-01-12 DIAGNOSIS — I361 Nonrheumatic tricuspid (valve) insufficiency: Secondary | ICD-10-CM | POA: Diagnosis not present

## 2020-01-12 DIAGNOSIS — L97519 Non-pressure chronic ulcer of other part of right foot with unspecified severity: Secondary | ICD-10-CM | POA: Diagnosis not present

## 2020-01-12 DIAGNOSIS — L97312 Non-pressure chronic ulcer of right ankle with fat layer exposed: Secondary | ICD-10-CM | POA: Diagnosis not present

## 2020-01-12 DIAGNOSIS — I340 Nonrheumatic mitral (valve) insufficiency: Secondary | ICD-10-CM | POA: Diagnosis not present

## 2020-01-12 DIAGNOSIS — L03115 Cellulitis of right lower limb: Secondary | ICD-10-CM | POA: Diagnosis not present

## 2020-01-13 DIAGNOSIS — E11649 Type 2 diabetes mellitus with hypoglycemia without coma: Secondary | ICD-10-CM | POA: Diagnosis present

## 2020-01-13 DIAGNOSIS — N186 End stage renal disease: Secondary | ICD-10-CM | POA: Diagnosis not present

## 2020-01-13 DIAGNOSIS — L97412 Non-pressure chronic ulcer of right heel and midfoot with fat layer exposed: Secondary | ICD-10-CM | POA: Diagnosis present

## 2020-01-13 DIAGNOSIS — S91301A Unspecified open wound, right foot, initial encounter: Secondary | ICD-10-CM | POA: Diagnosis not present

## 2020-01-13 DIAGNOSIS — I5022 Chronic systolic (congestive) heart failure: Secondary | ICD-10-CM | POA: Diagnosis present

## 2020-01-13 DIAGNOSIS — E162 Hypoglycemia, unspecified: Secondary | ICD-10-CM | POA: Diagnosis not present

## 2020-01-13 DIAGNOSIS — L97422 Non-pressure chronic ulcer of left heel and midfoot with fat layer exposed: Secondary | ICD-10-CM | POA: Diagnosis present

## 2020-01-13 DIAGNOSIS — I878 Other specified disorders of veins: Secondary | ICD-10-CM | POA: Diagnosis present

## 2020-01-13 DIAGNOSIS — L97522 Non-pressure chronic ulcer of other part of left foot with fat layer exposed: Secondary | ICD-10-CM | POA: Diagnosis not present

## 2020-01-13 DIAGNOSIS — E1122 Type 2 diabetes mellitus with diabetic chronic kidney disease: Secondary | ICD-10-CM | POA: Diagnosis present

## 2020-01-13 DIAGNOSIS — Z20822 Contact with and (suspected) exposure to covid-19: Secondary | ICD-10-CM | POA: Diagnosis present

## 2020-01-13 DIAGNOSIS — L97519 Non-pressure chronic ulcer of other part of right foot with unspecified severity: Secondary | ICD-10-CM | POA: Diagnosis not present

## 2020-01-13 DIAGNOSIS — I110 Hypertensive heart disease with heart failure: Secondary | ICD-10-CM | POA: Diagnosis not present

## 2020-01-13 DIAGNOSIS — E785 Hyperlipidemia, unspecified: Secondary | ICD-10-CM | POA: Diagnosis present

## 2020-01-13 DIAGNOSIS — I132 Hypertensive heart and chronic kidney disease with heart failure and with stage 5 chronic kidney disease, or end stage renal disease: Secondary | ICD-10-CM | POA: Diagnosis present

## 2020-01-13 DIAGNOSIS — L97322 Non-pressure chronic ulcer of left ankle with fat layer exposed: Secondary | ICD-10-CM | POA: Diagnosis not present

## 2020-01-13 DIAGNOSIS — Z6841 Body Mass Index (BMI) 40.0 and over, adult: Secondary | ICD-10-CM | POA: Diagnosis not present

## 2020-01-13 DIAGNOSIS — S91302A Unspecified open wound, left foot, initial encounter: Secondary | ICD-10-CM | POA: Diagnosis not present

## 2020-01-13 DIAGNOSIS — E875 Hyperkalemia: Secondary | ICD-10-CM | POA: Diagnosis not present

## 2020-01-13 DIAGNOSIS — L03119 Cellulitis of unspecified part of limb: Secondary | ICD-10-CM | POA: Diagnosis not present

## 2020-01-13 DIAGNOSIS — D631 Anemia in chronic kidney disease: Secondary | ICD-10-CM | POA: Diagnosis present

## 2020-01-13 DIAGNOSIS — L03116 Cellulitis of left lower limb: Secondary | ICD-10-CM | POA: Diagnosis not present

## 2020-01-13 DIAGNOSIS — N2581 Secondary hyperparathyroidism of renal origin: Secondary | ICD-10-CM | POA: Diagnosis present

## 2020-01-13 DIAGNOSIS — L97312 Non-pressure chronic ulcer of right ankle with fat layer exposed: Secondary | ICD-10-CM | POA: Diagnosis not present

## 2020-01-13 DIAGNOSIS — Z992 Dependence on renal dialysis: Secondary | ICD-10-CM | POA: Diagnosis not present

## 2020-01-13 DIAGNOSIS — E11621 Type 2 diabetes mellitus with foot ulcer: Secondary | ICD-10-CM | POA: Diagnosis not present

## 2020-01-13 DIAGNOSIS — L03115 Cellulitis of right lower limb: Secondary | ICD-10-CM | POA: Diagnosis not present

## 2020-01-13 DIAGNOSIS — E119 Type 2 diabetes mellitus without complications: Secondary | ICD-10-CM | POA: Diagnosis not present

## 2020-01-13 DIAGNOSIS — I509 Heart failure, unspecified: Secondary | ICD-10-CM | POA: Diagnosis not present

## 2020-01-13 DIAGNOSIS — I160 Hypertensive urgency: Secondary | ICD-10-CM | POA: Diagnosis not present

## 2020-01-13 DIAGNOSIS — E1142 Type 2 diabetes mellitus with diabetic polyneuropathy: Secondary | ICD-10-CM | POA: Diagnosis not present

## 2020-01-13 DIAGNOSIS — L97512 Non-pressure chronic ulcer of other part of right foot with fat layer exposed: Secondary | ICD-10-CM | POA: Diagnosis not present

## 2020-01-15 DIAGNOSIS — L97322 Non-pressure chronic ulcer of left ankle with fat layer exposed: Secondary | ICD-10-CM | POA: Diagnosis not present

## 2020-01-15 DIAGNOSIS — E1142 Type 2 diabetes mellitus with diabetic polyneuropathy: Secondary | ICD-10-CM | POA: Diagnosis not present

## 2020-01-15 DIAGNOSIS — E11621 Type 2 diabetes mellitus with foot ulcer: Secondary | ICD-10-CM | POA: Diagnosis not present

## 2020-01-15 DIAGNOSIS — L03116 Cellulitis of left lower limb: Secondary | ICD-10-CM | POA: Diagnosis not present

## 2020-01-15 DIAGNOSIS — L97512 Non-pressure chronic ulcer of other part of right foot with fat layer exposed: Secondary | ICD-10-CM | POA: Diagnosis not present

## 2020-01-15 DIAGNOSIS — L97312 Non-pressure chronic ulcer of right ankle with fat layer exposed: Secondary | ICD-10-CM | POA: Diagnosis not present

## 2020-01-15 DIAGNOSIS — L03115 Cellulitis of right lower limb: Secondary | ICD-10-CM | POA: Diagnosis not present

## 2020-01-15 DIAGNOSIS — L97522 Non-pressure chronic ulcer of other part of left foot with fat layer exposed: Secondary | ICD-10-CM | POA: Diagnosis not present

## 2020-01-20 DIAGNOSIS — N2581 Secondary hyperparathyroidism of renal origin: Secondary | ICD-10-CM | POA: Diagnosis not present

## 2020-01-20 DIAGNOSIS — Z992 Dependence on renal dialysis: Secondary | ICD-10-CM | POA: Diagnosis not present

## 2020-01-20 DIAGNOSIS — E875 Hyperkalemia: Secondary | ICD-10-CM | POA: Diagnosis not present

## 2020-01-20 DIAGNOSIS — D509 Iron deficiency anemia, unspecified: Secondary | ICD-10-CM | POA: Diagnosis not present

## 2020-01-20 DIAGNOSIS — N186 End stage renal disease: Secondary | ICD-10-CM | POA: Diagnosis not present

## 2020-01-20 DIAGNOSIS — D631 Anemia in chronic kidney disease: Secondary | ICD-10-CM | POA: Diagnosis not present

## 2020-01-20 DIAGNOSIS — M79602 Pain in left arm: Secondary | ICD-10-CM | POA: Diagnosis not present

## 2020-01-20 DIAGNOSIS — T7840XA Allergy, unspecified, initial encounter: Secondary | ICD-10-CM | POA: Diagnosis not present

## 2020-01-20 DIAGNOSIS — E1122 Type 2 diabetes mellitus with diabetic chronic kidney disease: Secondary | ICD-10-CM | POA: Diagnosis not present

## 2020-01-20 DIAGNOSIS — T829XXA Unspecified complication of cardiac and vascular prosthetic device, implant and graft, initial encounter: Secondary | ICD-10-CM | POA: Diagnosis not present

## 2020-01-20 DIAGNOSIS — T82838A Hemorrhage of vascular prosthetic devices, implants and grafts, initial encounter: Secondary | ICD-10-CM | POA: Diagnosis not present

## 2020-01-23 DIAGNOSIS — N2581 Secondary hyperparathyroidism of renal origin: Secondary | ICD-10-CM | POA: Diagnosis not present

## 2020-01-23 DIAGNOSIS — T7840XA Allergy, unspecified, initial encounter: Secondary | ICD-10-CM | POA: Diagnosis not present

## 2020-01-23 DIAGNOSIS — D631 Anemia in chronic kidney disease: Secondary | ICD-10-CM | POA: Diagnosis not present

## 2020-01-23 DIAGNOSIS — D509 Iron deficiency anemia, unspecified: Secondary | ICD-10-CM | POA: Diagnosis not present

## 2020-01-23 DIAGNOSIS — N186 End stage renal disease: Secondary | ICD-10-CM | POA: Diagnosis not present

## 2020-01-23 DIAGNOSIS — Z992 Dependence on renal dialysis: Secondary | ICD-10-CM | POA: Diagnosis not present

## 2020-01-25 DIAGNOSIS — D509 Iron deficiency anemia, unspecified: Secondary | ICD-10-CM | POA: Diagnosis not present

## 2020-01-25 DIAGNOSIS — E119 Type 2 diabetes mellitus without complications: Secondary | ICD-10-CM | POA: Diagnosis not present

## 2020-01-25 DIAGNOSIS — D631 Anemia in chronic kidney disease: Secondary | ICD-10-CM | POA: Diagnosis not present

## 2020-01-25 DIAGNOSIS — N186 End stage renal disease: Secondary | ICD-10-CM | POA: Diagnosis not present

## 2020-01-25 DIAGNOSIS — E8770 Fluid overload, unspecified: Secondary | ICD-10-CM | POA: Diagnosis not present

## 2020-01-25 DIAGNOSIS — N2581 Secondary hyperparathyroidism of renal origin: Secondary | ICD-10-CM | POA: Diagnosis not present

## 2020-01-25 DIAGNOSIS — Z992 Dependence on renal dialysis: Secondary | ICD-10-CM | POA: Diagnosis not present

## 2020-01-27 DIAGNOSIS — D631 Anemia in chronic kidney disease: Secondary | ICD-10-CM | POA: Diagnosis not present

## 2020-01-27 DIAGNOSIS — Z992 Dependence on renal dialysis: Secondary | ICD-10-CM | POA: Diagnosis not present

## 2020-01-27 DIAGNOSIS — E8770 Fluid overload, unspecified: Secondary | ICD-10-CM | POA: Diagnosis not present

## 2020-01-27 DIAGNOSIS — N2581 Secondary hyperparathyroidism of renal origin: Secondary | ICD-10-CM | POA: Diagnosis not present

## 2020-01-27 DIAGNOSIS — N186 End stage renal disease: Secondary | ICD-10-CM | POA: Diagnosis not present

## 2020-01-27 DIAGNOSIS — D509 Iron deficiency anemia, unspecified: Secondary | ICD-10-CM | POA: Diagnosis not present

## 2020-01-30 DIAGNOSIS — N186 End stage renal disease: Secondary | ICD-10-CM | POA: Diagnosis not present

## 2020-01-30 DIAGNOSIS — D631 Anemia in chronic kidney disease: Secondary | ICD-10-CM | POA: Diagnosis not present

## 2020-01-30 DIAGNOSIS — N2581 Secondary hyperparathyroidism of renal origin: Secondary | ICD-10-CM | POA: Diagnosis not present

## 2020-01-30 DIAGNOSIS — E8770 Fluid overload, unspecified: Secondary | ICD-10-CM | POA: Diagnosis not present

## 2020-01-30 DIAGNOSIS — D509 Iron deficiency anemia, unspecified: Secondary | ICD-10-CM | POA: Diagnosis not present

## 2020-01-30 DIAGNOSIS — Z992 Dependence on renal dialysis: Secondary | ICD-10-CM | POA: Diagnosis not present

## 2020-02-01 DIAGNOSIS — N186 End stage renal disease: Secondary | ICD-10-CM | POA: Diagnosis not present

## 2020-02-01 DIAGNOSIS — Z992 Dependence on renal dialysis: Secondary | ICD-10-CM | POA: Diagnosis not present

## 2020-02-01 DIAGNOSIS — E8770 Fluid overload, unspecified: Secondary | ICD-10-CM | POA: Diagnosis not present

## 2020-02-01 DIAGNOSIS — N2581 Secondary hyperparathyroidism of renal origin: Secondary | ICD-10-CM | POA: Diagnosis not present

## 2020-02-01 DIAGNOSIS — D631 Anemia in chronic kidney disease: Secondary | ICD-10-CM | POA: Diagnosis not present

## 2020-02-01 DIAGNOSIS — D509 Iron deficiency anemia, unspecified: Secondary | ICD-10-CM | POA: Diagnosis not present

## 2020-02-03 DIAGNOSIS — D509 Iron deficiency anemia, unspecified: Secondary | ICD-10-CM | POA: Diagnosis not present

## 2020-02-03 DIAGNOSIS — N2581 Secondary hyperparathyroidism of renal origin: Secondary | ICD-10-CM | POA: Diagnosis not present

## 2020-02-03 DIAGNOSIS — E8770 Fluid overload, unspecified: Secondary | ICD-10-CM | POA: Diagnosis not present

## 2020-02-03 DIAGNOSIS — N186 End stage renal disease: Secondary | ICD-10-CM | POA: Diagnosis not present

## 2020-02-03 DIAGNOSIS — D631 Anemia in chronic kidney disease: Secondary | ICD-10-CM | POA: Diagnosis not present

## 2020-02-03 DIAGNOSIS — Z992 Dependence on renal dialysis: Secondary | ICD-10-CM | POA: Diagnosis not present

## 2020-02-06 DIAGNOSIS — N2581 Secondary hyperparathyroidism of renal origin: Secondary | ICD-10-CM | POA: Diagnosis not present

## 2020-02-06 DIAGNOSIS — D509 Iron deficiency anemia, unspecified: Secondary | ICD-10-CM | POA: Diagnosis not present

## 2020-02-06 DIAGNOSIS — E8770 Fluid overload, unspecified: Secondary | ICD-10-CM | POA: Diagnosis not present

## 2020-02-06 DIAGNOSIS — D631 Anemia in chronic kidney disease: Secondary | ICD-10-CM | POA: Diagnosis not present

## 2020-02-06 DIAGNOSIS — Z992 Dependence on renal dialysis: Secondary | ICD-10-CM | POA: Diagnosis not present

## 2020-02-06 DIAGNOSIS — N186 End stage renal disease: Secondary | ICD-10-CM | POA: Diagnosis not present

## 2020-02-08 DIAGNOSIS — N2581 Secondary hyperparathyroidism of renal origin: Secondary | ICD-10-CM | POA: Diagnosis not present

## 2020-02-08 DIAGNOSIS — E8770 Fluid overload, unspecified: Secondary | ICD-10-CM | POA: Diagnosis not present

## 2020-02-08 DIAGNOSIS — N186 End stage renal disease: Secondary | ICD-10-CM | POA: Diagnosis not present

## 2020-02-08 DIAGNOSIS — D631 Anemia in chronic kidney disease: Secondary | ICD-10-CM | POA: Diagnosis not present

## 2020-02-08 DIAGNOSIS — Z992 Dependence on renal dialysis: Secondary | ICD-10-CM | POA: Diagnosis not present

## 2020-02-08 DIAGNOSIS — D509 Iron deficiency anemia, unspecified: Secondary | ICD-10-CM | POA: Diagnosis not present

## 2020-02-10 DIAGNOSIS — E8770 Fluid overload, unspecified: Secondary | ICD-10-CM | POA: Diagnosis not present

## 2020-02-10 DIAGNOSIS — N2581 Secondary hyperparathyroidism of renal origin: Secondary | ICD-10-CM | POA: Diagnosis not present

## 2020-02-10 DIAGNOSIS — N186 End stage renal disease: Secondary | ICD-10-CM | POA: Diagnosis not present

## 2020-02-10 DIAGNOSIS — D631 Anemia in chronic kidney disease: Secondary | ICD-10-CM | POA: Diagnosis not present

## 2020-02-10 DIAGNOSIS — Z992 Dependence on renal dialysis: Secondary | ICD-10-CM | POA: Diagnosis not present

## 2020-02-10 DIAGNOSIS — D509 Iron deficiency anemia, unspecified: Secondary | ICD-10-CM | POA: Diagnosis not present

## 2020-02-14 DIAGNOSIS — J449 Chronic obstructive pulmonary disease, unspecified: Secondary | ICD-10-CM | POA: Diagnosis not present

## 2020-02-14 DIAGNOSIS — E8770 Fluid overload, unspecified: Secondary | ICD-10-CM | POA: Diagnosis not present

## 2020-02-14 DIAGNOSIS — T82858A Stenosis of vascular prosthetic devices, implants and grafts, initial encounter: Secondary | ICD-10-CM | POA: Diagnosis not present

## 2020-02-14 DIAGNOSIS — E1151 Type 2 diabetes mellitus with diabetic peripheral angiopathy without gangrene: Secondary | ICD-10-CM | POA: Diagnosis not present

## 2020-02-14 DIAGNOSIS — D509 Iron deficiency anemia, unspecified: Secondary | ICD-10-CM | POA: Diagnosis not present

## 2020-02-14 DIAGNOSIS — G4730 Sleep apnea, unspecified: Secondary | ICD-10-CM | POA: Diagnosis not present

## 2020-02-14 DIAGNOSIS — Z992 Dependence on renal dialysis: Secondary | ICD-10-CM | POA: Diagnosis not present

## 2020-02-14 DIAGNOSIS — I120 Hypertensive chronic kidney disease with stage 5 chronic kidney disease or end stage renal disease: Secondary | ICD-10-CM | POA: Diagnosis not present

## 2020-02-14 DIAGNOSIS — T82838A Hemorrhage of vascular prosthetic devices, implants and grafts, initial encounter: Secondary | ICD-10-CM | POA: Diagnosis not present

## 2020-02-14 DIAGNOSIS — D631 Anemia in chronic kidney disease: Secondary | ICD-10-CM | POA: Diagnosis not present

## 2020-02-14 DIAGNOSIS — N2581 Secondary hyperparathyroidism of renal origin: Secondary | ICD-10-CM | POA: Diagnosis not present

## 2020-02-14 DIAGNOSIS — K219 Gastro-esophageal reflux disease without esophagitis: Secondary | ICD-10-CM | POA: Diagnosis not present

## 2020-02-14 DIAGNOSIS — T82898A Other specified complication of vascular prosthetic devices, implants and grafts, initial encounter: Secondary | ICD-10-CM | POA: Diagnosis not present

## 2020-02-14 DIAGNOSIS — E1122 Type 2 diabetes mellitus with diabetic chronic kidney disease: Secondary | ICD-10-CM | POA: Diagnosis not present

## 2020-02-14 DIAGNOSIS — I871 Compression of vein: Secondary | ICD-10-CM | POA: Diagnosis not present

## 2020-02-14 DIAGNOSIS — N186 End stage renal disease: Secondary | ICD-10-CM | POA: Diagnosis not present

## 2020-02-15 DIAGNOSIS — Z794 Long term (current) use of insulin: Secondary | ICD-10-CM | POA: Diagnosis not present

## 2020-02-15 DIAGNOSIS — N186 End stage renal disease: Secondary | ICD-10-CM | POA: Diagnosis not present

## 2020-02-15 DIAGNOSIS — I120 Hypertensive chronic kidney disease with stage 5 chronic kidney disease or end stage renal disease: Secondary | ICD-10-CM | POA: Diagnosis not present

## 2020-02-15 DIAGNOSIS — E1122 Type 2 diabetes mellitus with diabetic chronic kidney disease: Secondary | ICD-10-CM | POA: Diagnosis not present

## 2020-02-15 DIAGNOSIS — J449 Chronic obstructive pulmonary disease, unspecified: Secondary | ICD-10-CM | POA: Diagnosis not present

## 2020-02-15 DIAGNOSIS — G4730 Sleep apnea, unspecified: Secondary | ICD-10-CM | POA: Diagnosis not present

## 2020-02-15 DIAGNOSIS — T82898A Other specified complication of vascular prosthetic devices, implants and grafts, initial encounter: Secondary | ICD-10-CM | POA: Diagnosis not present

## 2020-02-15 DIAGNOSIS — Z4901 Encounter for fitting and adjustment of extracorporeal dialysis catheter: Secondary | ICD-10-CM | POA: Diagnosis not present

## 2020-02-15 DIAGNOSIS — E1151 Type 2 diabetes mellitus with diabetic peripheral angiopathy without gangrene: Secondary | ICD-10-CM | POA: Diagnosis not present

## 2020-02-15 DIAGNOSIS — K219 Gastro-esophageal reflux disease without esophagitis: Secondary | ICD-10-CM | POA: Diagnosis not present

## 2020-02-16 DIAGNOSIS — D509 Iron deficiency anemia, unspecified: Secondary | ICD-10-CM | POA: Diagnosis not present

## 2020-02-16 DIAGNOSIS — N2581 Secondary hyperparathyroidism of renal origin: Secondary | ICD-10-CM | POA: Diagnosis not present

## 2020-02-16 DIAGNOSIS — E8770 Fluid overload, unspecified: Secondary | ICD-10-CM | POA: Diagnosis not present

## 2020-02-16 DIAGNOSIS — D631 Anemia in chronic kidney disease: Secondary | ICD-10-CM | POA: Diagnosis not present

## 2020-02-16 DIAGNOSIS — Z992 Dependence on renal dialysis: Secondary | ICD-10-CM | POA: Diagnosis not present

## 2020-02-16 DIAGNOSIS — N186 End stage renal disease: Secondary | ICD-10-CM | POA: Diagnosis not present

## 2020-02-17 DIAGNOSIS — D631 Anemia in chronic kidney disease: Secondary | ICD-10-CM | POA: Diagnosis not present

## 2020-02-17 DIAGNOSIS — Z992 Dependence on renal dialysis: Secondary | ICD-10-CM | POA: Diagnosis not present

## 2020-02-17 DIAGNOSIS — E8770 Fluid overload, unspecified: Secondary | ICD-10-CM | POA: Diagnosis not present

## 2020-02-17 DIAGNOSIS — N2581 Secondary hyperparathyroidism of renal origin: Secondary | ICD-10-CM | POA: Diagnosis not present

## 2020-02-17 DIAGNOSIS — N186 End stage renal disease: Secondary | ICD-10-CM | POA: Diagnosis not present

## 2020-02-17 DIAGNOSIS — D509 Iron deficiency anemia, unspecified: Secondary | ICD-10-CM | POA: Diagnosis not present

## 2020-02-18 DIAGNOSIS — N186 End stage renal disease: Secondary | ICD-10-CM | POA: Diagnosis not present

## 2020-02-18 DIAGNOSIS — N2581 Secondary hyperparathyroidism of renal origin: Secondary | ICD-10-CM | POA: Diagnosis not present

## 2020-02-18 DIAGNOSIS — Z992 Dependence on renal dialysis: Secondary | ICD-10-CM | POA: Diagnosis not present

## 2020-02-18 DIAGNOSIS — D631 Anemia in chronic kidney disease: Secondary | ICD-10-CM | POA: Diagnosis not present

## 2020-02-18 DIAGNOSIS — E8770 Fluid overload, unspecified: Secondary | ICD-10-CM | POA: Diagnosis not present

## 2020-02-18 DIAGNOSIS — D509 Iron deficiency anemia, unspecified: Secondary | ICD-10-CM | POA: Diagnosis not present

## 2020-02-20 DIAGNOSIS — Z992 Dependence on renal dialysis: Secondary | ICD-10-CM | POA: Diagnosis not present

## 2020-02-20 DIAGNOSIS — N186 End stage renal disease: Secondary | ICD-10-CM | POA: Diagnosis not present

## 2020-02-20 DIAGNOSIS — E8770 Fluid overload, unspecified: Secondary | ICD-10-CM | POA: Diagnosis not present

## 2020-02-20 DIAGNOSIS — N2581 Secondary hyperparathyroidism of renal origin: Secondary | ICD-10-CM | POA: Diagnosis not present

## 2020-02-20 DIAGNOSIS — D509 Iron deficiency anemia, unspecified: Secondary | ICD-10-CM | POA: Diagnosis not present

## 2020-02-20 DIAGNOSIS — D631 Anemia in chronic kidney disease: Secondary | ICD-10-CM | POA: Diagnosis not present

## 2020-02-21 DIAGNOSIS — T82858A Stenosis of vascular prosthetic devices, implants and grafts, initial encounter: Secondary | ICD-10-CM | POA: Diagnosis not present

## 2020-02-21 DIAGNOSIS — G4730 Sleep apnea, unspecified: Secondary | ICD-10-CM | POA: Diagnosis not present

## 2020-02-21 DIAGNOSIS — Z992 Dependence on renal dialysis: Secondary | ICD-10-CM | POA: Diagnosis not present

## 2020-02-21 DIAGNOSIS — T82590A Other mechanical complication of surgically created arteriovenous fistula, initial encounter: Secondary | ICD-10-CM | POA: Diagnosis not present

## 2020-02-21 DIAGNOSIS — T82898A Other specified complication of vascular prosthetic devices, implants and grafts, initial encounter: Secondary | ICD-10-CM | POA: Diagnosis not present

## 2020-02-21 DIAGNOSIS — E1122 Type 2 diabetes mellitus with diabetic chronic kidney disease: Secondary | ICD-10-CM | POA: Diagnosis not present

## 2020-02-21 DIAGNOSIS — I120 Hypertensive chronic kidney disease with stage 5 chronic kidney disease or end stage renal disease: Secondary | ICD-10-CM | POA: Diagnosis not present

## 2020-02-21 DIAGNOSIS — N186 End stage renal disease: Secondary | ICD-10-CM | POA: Diagnosis not present

## 2020-02-21 DIAGNOSIS — E1151 Type 2 diabetes mellitus with diabetic peripheral angiopathy without gangrene: Secondary | ICD-10-CM | POA: Diagnosis not present

## 2020-02-21 DIAGNOSIS — K219 Gastro-esophageal reflux disease without esophagitis: Secondary | ICD-10-CM | POA: Diagnosis not present

## 2020-02-21 DIAGNOSIS — I871 Compression of vein: Secondary | ICD-10-CM | POA: Diagnosis not present

## 2020-02-22 DIAGNOSIS — N2581 Secondary hyperparathyroidism of renal origin: Secondary | ICD-10-CM | POA: Diagnosis not present

## 2020-02-22 DIAGNOSIS — N186 End stage renal disease: Secondary | ICD-10-CM | POA: Diagnosis not present

## 2020-02-22 DIAGNOSIS — E8770 Fluid overload, unspecified: Secondary | ICD-10-CM | POA: Diagnosis not present

## 2020-02-22 DIAGNOSIS — D509 Iron deficiency anemia, unspecified: Secondary | ICD-10-CM | POA: Diagnosis not present

## 2020-02-22 DIAGNOSIS — Z992 Dependence on renal dialysis: Secondary | ICD-10-CM | POA: Diagnosis not present

## 2020-02-22 DIAGNOSIS — D631 Anemia in chronic kidney disease: Secondary | ICD-10-CM | POA: Diagnosis not present

## 2020-02-24 DIAGNOSIS — Z992 Dependence on renal dialysis: Secondary | ICD-10-CM | POA: Diagnosis not present

## 2020-02-24 DIAGNOSIS — Z23 Encounter for immunization: Secondary | ICD-10-CM | POA: Diagnosis not present

## 2020-02-24 DIAGNOSIS — N186 End stage renal disease: Secondary | ICD-10-CM | POA: Diagnosis not present

## 2020-02-24 DIAGNOSIS — N2581 Secondary hyperparathyroidism of renal origin: Secondary | ICD-10-CM | POA: Diagnosis not present

## 2020-02-24 DIAGNOSIS — D631 Anemia in chronic kidney disease: Secondary | ICD-10-CM | POA: Diagnosis not present

## 2020-02-24 DIAGNOSIS — D509 Iron deficiency anemia, unspecified: Secondary | ICD-10-CM | POA: Diagnosis not present

## 2020-02-27 DIAGNOSIS — D631 Anemia in chronic kidney disease: Secondary | ICD-10-CM | POA: Diagnosis not present

## 2020-02-27 DIAGNOSIS — N2581 Secondary hyperparathyroidism of renal origin: Secondary | ICD-10-CM | POA: Diagnosis not present

## 2020-02-27 DIAGNOSIS — Z992 Dependence on renal dialysis: Secondary | ICD-10-CM | POA: Diagnosis not present

## 2020-02-27 DIAGNOSIS — N186 End stage renal disease: Secondary | ICD-10-CM | POA: Diagnosis not present

## 2020-02-27 DIAGNOSIS — Z23 Encounter for immunization: Secondary | ICD-10-CM | POA: Diagnosis not present

## 2020-02-27 DIAGNOSIS — D509 Iron deficiency anemia, unspecified: Secondary | ICD-10-CM | POA: Diagnosis not present

## 2020-02-29 DIAGNOSIS — Z23 Encounter for immunization: Secondary | ICD-10-CM | POA: Diagnosis not present

## 2020-02-29 DIAGNOSIS — D509 Iron deficiency anemia, unspecified: Secondary | ICD-10-CM | POA: Diagnosis not present

## 2020-02-29 DIAGNOSIS — N186 End stage renal disease: Secondary | ICD-10-CM | POA: Diagnosis not present

## 2020-02-29 DIAGNOSIS — N2581 Secondary hyperparathyroidism of renal origin: Secondary | ICD-10-CM | POA: Diagnosis not present

## 2020-02-29 DIAGNOSIS — D631 Anemia in chronic kidney disease: Secondary | ICD-10-CM | POA: Diagnosis not present

## 2020-02-29 DIAGNOSIS — Z992 Dependence on renal dialysis: Secondary | ICD-10-CM | POA: Diagnosis not present

## 2020-03-02 DIAGNOSIS — Z23 Encounter for immunization: Secondary | ICD-10-CM | POA: Diagnosis not present

## 2020-03-02 DIAGNOSIS — D509 Iron deficiency anemia, unspecified: Secondary | ICD-10-CM | POA: Diagnosis not present

## 2020-03-02 DIAGNOSIS — N2581 Secondary hyperparathyroidism of renal origin: Secondary | ICD-10-CM | POA: Diagnosis not present

## 2020-03-02 DIAGNOSIS — Z992 Dependence on renal dialysis: Secondary | ICD-10-CM | POA: Diagnosis not present

## 2020-03-02 DIAGNOSIS — N186 End stage renal disease: Secondary | ICD-10-CM | POA: Diagnosis not present

## 2020-03-02 DIAGNOSIS — D631 Anemia in chronic kidney disease: Secondary | ICD-10-CM | POA: Diagnosis not present

## 2020-03-05 DIAGNOSIS — Z992 Dependence on renal dialysis: Secondary | ICD-10-CM | POA: Diagnosis not present

## 2020-03-05 DIAGNOSIS — D509 Iron deficiency anemia, unspecified: Secondary | ICD-10-CM | POA: Diagnosis not present

## 2020-03-05 DIAGNOSIS — N2581 Secondary hyperparathyroidism of renal origin: Secondary | ICD-10-CM | POA: Diagnosis not present

## 2020-03-05 DIAGNOSIS — Z23 Encounter for immunization: Secondary | ICD-10-CM | POA: Diagnosis not present

## 2020-03-05 DIAGNOSIS — N186 End stage renal disease: Secondary | ICD-10-CM | POA: Diagnosis not present

## 2020-03-05 DIAGNOSIS — D631 Anemia in chronic kidney disease: Secondary | ICD-10-CM | POA: Diagnosis not present

## 2020-03-07 DIAGNOSIS — D509 Iron deficiency anemia, unspecified: Secondary | ICD-10-CM | POA: Diagnosis not present

## 2020-03-07 DIAGNOSIS — N186 End stage renal disease: Secondary | ICD-10-CM | POA: Diagnosis not present

## 2020-03-07 DIAGNOSIS — N2581 Secondary hyperparathyroidism of renal origin: Secondary | ICD-10-CM | POA: Diagnosis not present

## 2020-03-07 DIAGNOSIS — Z992 Dependence on renal dialysis: Secondary | ICD-10-CM | POA: Diagnosis not present

## 2020-03-07 DIAGNOSIS — D631 Anemia in chronic kidney disease: Secondary | ICD-10-CM | POA: Diagnosis not present

## 2020-03-07 DIAGNOSIS — Z23 Encounter for immunization: Secondary | ICD-10-CM | POA: Diagnosis not present

## 2020-03-09 DIAGNOSIS — Z992 Dependence on renal dialysis: Secondary | ICD-10-CM | POA: Diagnosis not present

## 2020-03-09 DIAGNOSIS — D631 Anemia in chronic kidney disease: Secondary | ICD-10-CM | POA: Diagnosis not present

## 2020-03-09 DIAGNOSIS — N186 End stage renal disease: Secondary | ICD-10-CM | POA: Diagnosis not present

## 2020-03-09 DIAGNOSIS — N2581 Secondary hyperparathyroidism of renal origin: Secondary | ICD-10-CM | POA: Diagnosis not present

## 2020-03-09 DIAGNOSIS — Z23 Encounter for immunization: Secondary | ICD-10-CM | POA: Diagnosis not present

## 2020-03-09 DIAGNOSIS — D509 Iron deficiency anemia, unspecified: Secondary | ICD-10-CM | POA: Diagnosis not present

## 2020-03-12 DIAGNOSIS — L03116 Cellulitis of left lower limb: Secondary | ICD-10-CM | POA: Diagnosis not present

## 2020-03-12 DIAGNOSIS — D509 Iron deficiency anemia, unspecified: Secondary | ICD-10-CM | POA: Diagnosis not present

## 2020-03-12 DIAGNOSIS — E11621 Type 2 diabetes mellitus with foot ulcer: Secondary | ICD-10-CM | POA: Diagnosis not present

## 2020-03-12 DIAGNOSIS — D631 Anemia in chronic kidney disease: Secondary | ICD-10-CM | POA: Diagnosis not present

## 2020-03-12 DIAGNOSIS — N186 End stage renal disease: Secondary | ICD-10-CM | POA: Diagnosis not present

## 2020-03-12 DIAGNOSIS — L97522 Non-pressure chronic ulcer of other part of left foot with fat layer exposed: Secondary | ICD-10-CM | POA: Diagnosis not present

## 2020-03-12 DIAGNOSIS — L97512 Non-pressure chronic ulcer of other part of right foot with fat layer exposed: Secondary | ICD-10-CM | POA: Diagnosis not present

## 2020-03-12 DIAGNOSIS — L03115 Cellulitis of right lower limb: Secondary | ICD-10-CM | POA: Diagnosis not present

## 2020-03-12 DIAGNOSIS — L97312 Non-pressure chronic ulcer of right ankle with fat layer exposed: Secondary | ICD-10-CM | POA: Diagnosis not present

## 2020-03-12 DIAGNOSIS — Z23 Encounter for immunization: Secondary | ICD-10-CM | POA: Diagnosis not present

## 2020-03-12 DIAGNOSIS — E1142 Type 2 diabetes mellitus with diabetic polyneuropathy: Secondary | ICD-10-CM | POA: Diagnosis not present

## 2020-03-12 DIAGNOSIS — N2581 Secondary hyperparathyroidism of renal origin: Secondary | ICD-10-CM | POA: Diagnosis not present

## 2020-03-12 DIAGNOSIS — L97322 Non-pressure chronic ulcer of left ankle with fat layer exposed: Secondary | ICD-10-CM | POA: Diagnosis not present

## 2020-03-12 DIAGNOSIS — Z992 Dependence on renal dialysis: Secondary | ICD-10-CM | POA: Diagnosis not present

## 2020-03-14 DIAGNOSIS — Z992 Dependence on renal dialysis: Secondary | ICD-10-CM | POA: Diagnosis not present

## 2020-03-14 DIAGNOSIS — N186 End stage renal disease: Secondary | ICD-10-CM | POA: Diagnosis not present

## 2020-03-14 DIAGNOSIS — N2581 Secondary hyperparathyroidism of renal origin: Secondary | ICD-10-CM | POA: Diagnosis not present

## 2020-03-14 DIAGNOSIS — D631 Anemia in chronic kidney disease: Secondary | ICD-10-CM | POA: Diagnosis not present

## 2020-03-14 DIAGNOSIS — D509 Iron deficiency anemia, unspecified: Secondary | ICD-10-CM | POA: Diagnosis not present

## 2020-03-14 DIAGNOSIS — Z23 Encounter for immunization: Secondary | ICD-10-CM | POA: Diagnosis not present

## 2020-03-16 DIAGNOSIS — N186 End stage renal disease: Secondary | ICD-10-CM | POA: Diagnosis not present

## 2020-03-16 DIAGNOSIS — D631 Anemia in chronic kidney disease: Secondary | ICD-10-CM | POA: Diagnosis not present

## 2020-03-16 DIAGNOSIS — D509 Iron deficiency anemia, unspecified: Secondary | ICD-10-CM | POA: Diagnosis not present

## 2020-03-16 DIAGNOSIS — Z23 Encounter for immunization: Secondary | ICD-10-CM | POA: Diagnosis not present

## 2020-03-16 DIAGNOSIS — Z992 Dependence on renal dialysis: Secondary | ICD-10-CM | POA: Diagnosis not present

## 2020-03-16 DIAGNOSIS — N2581 Secondary hyperparathyroidism of renal origin: Secondary | ICD-10-CM | POA: Diagnosis not present

## 2020-03-19 DIAGNOSIS — Z23 Encounter for immunization: Secondary | ICD-10-CM | POA: Diagnosis not present

## 2020-03-19 DIAGNOSIS — D509 Iron deficiency anemia, unspecified: Secondary | ICD-10-CM | POA: Diagnosis not present

## 2020-03-19 DIAGNOSIS — D631 Anemia in chronic kidney disease: Secondary | ICD-10-CM | POA: Diagnosis not present

## 2020-03-19 DIAGNOSIS — N2581 Secondary hyperparathyroidism of renal origin: Secondary | ICD-10-CM | POA: Diagnosis not present

## 2020-03-19 DIAGNOSIS — N186 End stage renal disease: Secondary | ICD-10-CM | POA: Diagnosis not present

## 2020-03-19 DIAGNOSIS — Z992 Dependence on renal dialysis: Secondary | ICD-10-CM | POA: Diagnosis not present

## 2020-03-20 DIAGNOSIS — L97522 Non-pressure chronic ulcer of other part of left foot with fat layer exposed: Secondary | ICD-10-CM | POA: Diagnosis not present

## 2020-03-20 DIAGNOSIS — L03116 Cellulitis of left lower limb: Secondary | ICD-10-CM | POA: Diagnosis not present

## 2020-03-20 DIAGNOSIS — L97312 Non-pressure chronic ulcer of right ankle with fat layer exposed: Secondary | ICD-10-CM | POA: Diagnosis not present

## 2020-03-20 DIAGNOSIS — E11621 Type 2 diabetes mellitus with foot ulcer: Secondary | ICD-10-CM | POA: Diagnosis not present

## 2020-03-20 DIAGNOSIS — L97322 Non-pressure chronic ulcer of left ankle with fat layer exposed: Secondary | ICD-10-CM | POA: Diagnosis not present

## 2020-03-20 DIAGNOSIS — E1142 Type 2 diabetes mellitus with diabetic polyneuropathy: Secondary | ICD-10-CM | POA: Diagnosis not present

## 2020-03-20 DIAGNOSIS — L97512 Non-pressure chronic ulcer of other part of right foot with fat layer exposed: Secondary | ICD-10-CM | POA: Diagnosis not present

## 2020-03-20 DIAGNOSIS — L03115 Cellulitis of right lower limb: Secondary | ICD-10-CM | POA: Diagnosis not present

## 2020-03-21 DIAGNOSIS — Z992 Dependence on renal dialysis: Secondary | ICD-10-CM | POA: Diagnosis not present

## 2020-03-21 DIAGNOSIS — N2581 Secondary hyperparathyroidism of renal origin: Secondary | ICD-10-CM | POA: Diagnosis not present

## 2020-03-21 DIAGNOSIS — D509 Iron deficiency anemia, unspecified: Secondary | ICD-10-CM | POA: Diagnosis not present

## 2020-03-21 DIAGNOSIS — D631 Anemia in chronic kidney disease: Secondary | ICD-10-CM | POA: Diagnosis not present

## 2020-03-21 DIAGNOSIS — N186 End stage renal disease: Secondary | ICD-10-CM | POA: Diagnosis not present

## 2020-03-21 DIAGNOSIS — Z23 Encounter for immunization: Secondary | ICD-10-CM | POA: Diagnosis not present

## 2020-03-23 DIAGNOSIS — N2581 Secondary hyperparathyroidism of renal origin: Secondary | ICD-10-CM | POA: Diagnosis not present

## 2020-03-23 DIAGNOSIS — N186 End stage renal disease: Secondary | ICD-10-CM | POA: Diagnosis not present

## 2020-03-23 DIAGNOSIS — Z992 Dependence on renal dialysis: Secondary | ICD-10-CM | POA: Diagnosis not present

## 2020-03-23 DIAGNOSIS — D631 Anemia in chronic kidney disease: Secondary | ICD-10-CM | POA: Diagnosis not present

## 2020-03-23 DIAGNOSIS — Z23 Encounter for immunization: Secondary | ICD-10-CM | POA: Diagnosis not present

## 2020-03-23 DIAGNOSIS — D509 Iron deficiency anemia, unspecified: Secondary | ICD-10-CM | POA: Diagnosis not present

## 2020-03-26 DIAGNOSIS — N2581 Secondary hyperparathyroidism of renal origin: Secondary | ICD-10-CM | POA: Diagnosis not present

## 2020-03-26 DIAGNOSIS — D631 Anemia in chronic kidney disease: Secondary | ICD-10-CM | POA: Diagnosis not present

## 2020-03-26 DIAGNOSIS — L97512 Non-pressure chronic ulcer of other part of right foot with fat layer exposed: Secondary | ICD-10-CM | POA: Diagnosis not present

## 2020-03-26 DIAGNOSIS — L03115 Cellulitis of right lower limb: Secondary | ICD-10-CM | POA: Diagnosis not present

## 2020-03-26 DIAGNOSIS — L97322 Non-pressure chronic ulcer of left ankle with fat layer exposed: Secondary | ICD-10-CM | POA: Diagnosis not present

## 2020-03-26 DIAGNOSIS — Z992 Dependence on renal dialysis: Secondary | ICD-10-CM | POA: Diagnosis not present

## 2020-03-26 DIAGNOSIS — N186 End stage renal disease: Secondary | ICD-10-CM | POA: Diagnosis not present

## 2020-03-26 DIAGNOSIS — L97312 Non-pressure chronic ulcer of right ankle with fat layer exposed: Secondary | ICD-10-CM | POA: Diagnosis not present

## 2020-03-26 DIAGNOSIS — L97522 Non-pressure chronic ulcer of other part of left foot with fat layer exposed: Secondary | ICD-10-CM | POA: Diagnosis not present

## 2020-03-26 DIAGNOSIS — L03116 Cellulitis of left lower limb: Secondary | ICD-10-CM | POA: Diagnosis not present

## 2020-03-26 DIAGNOSIS — D509 Iron deficiency anemia, unspecified: Secondary | ICD-10-CM | POA: Diagnosis not present

## 2020-03-26 DIAGNOSIS — E11621 Type 2 diabetes mellitus with foot ulcer: Secondary | ICD-10-CM | POA: Diagnosis not present

## 2020-03-26 DIAGNOSIS — E1142 Type 2 diabetes mellitus with diabetic polyneuropathy: Secondary | ICD-10-CM | POA: Diagnosis not present

## 2020-03-28 DIAGNOSIS — Z992 Dependence on renal dialysis: Secondary | ICD-10-CM | POA: Diagnosis not present

## 2020-03-28 DIAGNOSIS — L97312 Non-pressure chronic ulcer of right ankle with fat layer exposed: Secondary | ICD-10-CM | POA: Diagnosis not present

## 2020-03-28 DIAGNOSIS — L97522 Non-pressure chronic ulcer of other part of left foot with fat layer exposed: Secondary | ICD-10-CM | POA: Diagnosis not present

## 2020-03-28 DIAGNOSIS — L97322 Non-pressure chronic ulcer of left ankle with fat layer exposed: Secondary | ICD-10-CM | POA: Diagnosis not present

## 2020-03-28 DIAGNOSIS — E11621 Type 2 diabetes mellitus with foot ulcer: Secondary | ICD-10-CM | POA: Diagnosis not present

## 2020-03-28 DIAGNOSIS — N2581 Secondary hyperparathyroidism of renal origin: Secondary | ICD-10-CM | POA: Diagnosis not present

## 2020-03-28 DIAGNOSIS — E1142 Type 2 diabetes mellitus with diabetic polyneuropathy: Secondary | ICD-10-CM | POA: Diagnosis not present

## 2020-03-28 DIAGNOSIS — L03116 Cellulitis of left lower limb: Secondary | ICD-10-CM | POA: Diagnosis not present

## 2020-03-28 DIAGNOSIS — N186 End stage renal disease: Secondary | ICD-10-CM | POA: Diagnosis not present

## 2020-03-28 DIAGNOSIS — D631 Anemia in chronic kidney disease: Secondary | ICD-10-CM | POA: Diagnosis not present

## 2020-03-28 DIAGNOSIS — L03115 Cellulitis of right lower limb: Secondary | ICD-10-CM | POA: Diagnosis not present

## 2020-03-28 DIAGNOSIS — D509 Iron deficiency anemia, unspecified: Secondary | ICD-10-CM | POA: Diagnosis not present

## 2020-03-28 DIAGNOSIS — L97512 Non-pressure chronic ulcer of other part of right foot with fat layer exposed: Secondary | ICD-10-CM | POA: Diagnosis not present

## 2020-03-30 DIAGNOSIS — N186 End stage renal disease: Secondary | ICD-10-CM | POA: Diagnosis not present

## 2020-03-30 DIAGNOSIS — Z992 Dependence on renal dialysis: Secondary | ICD-10-CM | POA: Diagnosis not present

## 2020-03-30 DIAGNOSIS — D509 Iron deficiency anemia, unspecified: Secondary | ICD-10-CM | POA: Diagnosis not present

## 2020-03-30 DIAGNOSIS — N2581 Secondary hyperparathyroidism of renal origin: Secondary | ICD-10-CM | POA: Diagnosis not present

## 2020-03-30 DIAGNOSIS — D631 Anemia in chronic kidney disease: Secondary | ICD-10-CM | POA: Diagnosis not present

## 2020-04-02 DIAGNOSIS — N2581 Secondary hyperparathyroidism of renal origin: Secondary | ICD-10-CM | POA: Diagnosis not present

## 2020-04-02 DIAGNOSIS — D631 Anemia in chronic kidney disease: Secondary | ICD-10-CM | POA: Diagnosis not present

## 2020-04-02 DIAGNOSIS — Z992 Dependence on renal dialysis: Secondary | ICD-10-CM | POA: Diagnosis not present

## 2020-04-02 DIAGNOSIS — D509 Iron deficiency anemia, unspecified: Secondary | ICD-10-CM | POA: Diagnosis not present

## 2020-04-02 DIAGNOSIS — N186 End stage renal disease: Secondary | ICD-10-CM | POA: Diagnosis not present

## 2020-04-03 DIAGNOSIS — T82590A Other mechanical complication of surgically created arteriovenous fistula, initial encounter: Secondary | ICD-10-CM | POA: Diagnosis not present

## 2020-04-03 DIAGNOSIS — I132 Hypertensive heart and chronic kidney disease with heart failure and with stage 5 chronic kidney disease, or end stage renal disease: Secondary | ICD-10-CM | POA: Diagnosis not present

## 2020-04-03 DIAGNOSIS — N186 End stage renal disease: Secondary | ICD-10-CM | POA: Diagnosis not present

## 2020-04-03 DIAGNOSIS — Z992 Dependence on renal dialysis: Secondary | ICD-10-CM | POA: Diagnosis not present

## 2020-04-04 DIAGNOSIS — D509 Iron deficiency anemia, unspecified: Secondary | ICD-10-CM | POA: Diagnosis not present

## 2020-04-04 DIAGNOSIS — N2581 Secondary hyperparathyroidism of renal origin: Secondary | ICD-10-CM | POA: Diagnosis not present

## 2020-04-04 DIAGNOSIS — L03115 Cellulitis of right lower limb: Secondary | ICD-10-CM | POA: Diagnosis not present

## 2020-04-04 DIAGNOSIS — L97312 Non-pressure chronic ulcer of right ankle with fat layer exposed: Secondary | ICD-10-CM | POA: Diagnosis not present

## 2020-04-04 DIAGNOSIS — L97322 Non-pressure chronic ulcer of left ankle with fat layer exposed: Secondary | ICD-10-CM | POA: Diagnosis not present

## 2020-04-04 DIAGNOSIS — D631 Anemia in chronic kidney disease: Secondary | ICD-10-CM | POA: Diagnosis not present

## 2020-04-04 DIAGNOSIS — L97512 Non-pressure chronic ulcer of other part of right foot with fat layer exposed: Secondary | ICD-10-CM | POA: Diagnosis not present

## 2020-04-04 DIAGNOSIS — Z992 Dependence on renal dialysis: Secondary | ICD-10-CM | POA: Diagnosis not present

## 2020-04-04 DIAGNOSIS — L03116 Cellulitis of left lower limb: Secondary | ICD-10-CM | POA: Diagnosis not present

## 2020-04-04 DIAGNOSIS — L97522 Non-pressure chronic ulcer of other part of left foot with fat layer exposed: Secondary | ICD-10-CM | POA: Diagnosis not present

## 2020-04-04 DIAGNOSIS — E1142 Type 2 diabetes mellitus with diabetic polyneuropathy: Secondary | ICD-10-CM | POA: Diagnosis not present

## 2020-04-04 DIAGNOSIS — E11621 Type 2 diabetes mellitus with foot ulcer: Secondary | ICD-10-CM | POA: Diagnosis not present

## 2020-04-04 DIAGNOSIS — N186 End stage renal disease: Secondary | ICD-10-CM | POA: Diagnosis not present

## 2020-04-06 DIAGNOSIS — N186 End stage renal disease: Secondary | ICD-10-CM | POA: Diagnosis not present

## 2020-04-06 DIAGNOSIS — Z992 Dependence on renal dialysis: Secondary | ICD-10-CM | POA: Diagnosis not present

## 2020-04-06 DIAGNOSIS — N2581 Secondary hyperparathyroidism of renal origin: Secondary | ICD-10-CM | POA: Diagnosis not present

## 2020-04-06 DIAGNOSIS — D631 Anemia in chronic kidney disease: Secondary | ICD-10-CM | POA: Diagnosis not present

## 2020-04-06 DIAGNOSIS — D509 Iron deficiency anemia, unspecified: Secondary | ICD-10-CM | POA: Diagnosis not present

## 2020-04-09 DIAGNOSIS — D509 Iron deficiency anemia, unspecified: Secondary | ICD-10-CM | POA: Diagnosis not present

## 2020-04-09 DIAGNOSIS — D631 Anemia in chronic kidney disease: Secondary | ICD-10-CM | POA: Diagnosis not present

## 2020-04-09 DIAGNOSIS — Z992 Dependence on renal dialysis: Secondary | ICD-10-CM | POA: Diagnosis not present

## 2020-04-09 DIAGNOSIS — N2581 Secondary hyperparathyroidism of renal origin: Secondary | ICD-10-CM | POA: Diagnosis not present

## 2020-04-09 DIAGNOSIS — N186 End stage renal disease: Secondary | ICD-10-CM | POA: Diagnosis not present

## 2020-04-11 DIAGNOSIS — L03116 Cellulitis of left lower limb: Secondary | ICD-10-CM | POA: Diagnosis not present

## 2020-04-11 DIAGNOSIS — E11621 Type 2 diabetes mellitus with foot ulcer: Secondary | ICD-10-CM | POA: Diagnosis not present

## 2020-04-11 DIAGNOSIS — D509 Iron deficiency anemia, unspecified: Secondary | ICD-10-CM | POA: Diagnosis not present

## 2020-04-11 DIAGNOSIS — N2581 Secondary hyperparathyroidism of renal origin: Secondary | ICD-10-CM | POA: Diagnosis not present

## 2020-04-11 DIAGNOSIS — L03115 Cellulitis of right lower limb: Secondary | ICD-10-CM | POA: Diagnosis not present

## 2020-04-11 DIAGNOSIS — D631 Anemia in chronic kidney disease: Secondary | ICD-10-CM | POA: Diagnosis not present

## 2020-04-11 DIAGNOSIS — L97322 Non-pressure chronic ulcer of left ankle with fat layer exposed: Secondary | ICD-10-CM | POA: Diagnosis not present

## 2020-04-11 DIAGNOSIS — L97312 Non-pressure chronic ulcer of right ankle with fat layer exposed: Secondary | ICD-10-CM | POA: Diagnosis not present

## 2020-04-11 DIAGNOSIS — N186 End stage renal disease: Secondary | ICD-10-CM | POA: Diagnosis not present

## 2020-04-11 DIAGNOSIS — E1142 Type 2 diabetes mellitus with diabetic polyneuropathy: Secondary | ICD-10-CM | POA: Diagnosis not present

## 2020-04-11 DIAGNOSIS — L97512 Non-pressure chronic ulcer of other part of right foot with fat layer exposed: Secondary | ICD-10-CM | POA: Diagnosis not present

## 2020-04-11 DIAGNOSIS — L97522 Non-pressure chronic ulcer of other part of left foot with fat layer exposed: Secondary | ICD-10-CM | POA: Diagnosis not present

## 2020-04-11 DIAGNOSIS — Z992 Dependence on renal dialysis: Secondary | ICD-10-CM | POA: Diagnosis not present

## 2020-04-13 DIAGNOSIS — N186 End stage renal disease: Secondary | ICD-10-CM | POA: Diagnosis not present

## 2020-04-13 DIAGNOSIS — N2581 Secondary hyperparathyroidism of renal origin: Secondary | ICD-10-CM | POA: Diagnosis not present

## 2020-04-13 DIAGNOSIS — D631 Anemia in chronic kidney disease: Secondary | ICD-10-CM | POA: Diagnosis not present

## 2020-04-13 DIAGNOSIS — D509 Iron deficiency anemia, unspecified: Secondary | ICD-10-CM | POA: Diagnosis not present

## 2020-04-13 DIAGNOSIS — Z992 Dependence on renal dialysis: Secondary | ICD-10-CM | POA: Diagnosis not present

## 2020-04-15 DIAGNOSIS — N2581 Secondary hyperparathyroidism of renal origin: Secondary | ICD-10-CM | POA: Diagnosis not present

## 2020-04-15 DIAGNOSIS — D509 Iron deficiency anemia, unspecified: Secondary | ICD-10-CM | POA: Diagnosis not present

## 2020-04-15 DIAGNOSIS — D631 Anemia in chronic kidney disease: Secondary | ICD-10-CM | POA: Diagnosis not present

## 2020-04-15 DIAGNOSIS — N186 End stage renal disease: Secondary | ICD-10-CM | POA: Diagnosis not present

## 2020-04-15 DIAGNOSIS — Z992 Dependence on renal dialysis: Secondary | ICD-10-CM | POA: Diagnosis not present

## 2020-04-17 DIAGNOSIS — Z992 Dependence on renal dialysis: Secondary | ICD-10-CM | POA: Diagnosis not present

## 2020-04-17 DIAGNOSIS — N186 End stage renal disease: Secondary | ICD-10-CM | POA: Diagnosis not present

## 2020-04-17 DIAGNOSIS — D631 Anemia in chronic kidney disease: Secondary | ICD-10-CM | POA: Diagnosis not present

## 2020-04-17 DIAGNOSIS — D509 Iron deficiency anemia, unspecified: Secondary | ICD-10-CM | POA: Diagnosis not present

## 2020-04-17 DIAGNOSIS — N2581 Secondary hyperparathyroidism of renal origin: Secondary | ICD-10-CM | POA: Diagnosis not present

## 2020-04-18 DIAGNOSIS — E11621 Type 2 diabetes mellitus with foot ulcer: Secondary | ICD-10-CM | POA: Diagnosis not present

## 2020-04-18 DIAGNOSIS — L03116 Cellulitis of left lower limb: Secondary | ICD-10-CM | POA: Diagnosis not present

## 2020-04-18 DIAGNOSIS — L97522 Non-pressure chronic ulcer of other part of left foot with fat layer exposed: Secondary | ICD-10-CM | POA: Diagnosis not present

## 2020-04-18 DIAGNOSIS — L97512 Non-pressure chronic ulcer of other part of right foot with fat layer exposed: Secondary | ICD-10-CM | POA: Diagnosis not present

## 2020-04-18 DIAGNOSIS — L97312 Non-pressure chronic ulcer of right ankle with fat layer exposed: Secondary | ICD-10-CM | POA: Diagnosis not present

## 2020-04-18 DIAGNOSIS — L03115 Cellulitis of right lower limb: Secondary | ICD-10-CM | POA: Diagnosis not present

## 2020-04-18 DIAGNOSIS — L97322 Non-pressure chronic ulcer of left ankle with fat layer exposed: Secondary | ICD-10-CM | POA: Diagnosis not present

## 2020-04-18 DIAGNOSIS — E1142 Type 2 diabetes mellitus with diabetic polyneuropathy: Secondary | ICD-10-CM | POA: Diagnosis not present

## 2020-04-20 DIAGNOSIS — Z992 Dependence on renal dialysis: Secondary | ICD-10-CM | POA: Diagnosis not present

## 2020-04-20 DIAGNOSIS — D631 Anemia in chronic kidney disease: Secondary | ICD-10-CM | POA: Diagnosis not present

## 2020-04-20 DIAGNOSIS — N186 End stage renal disease: Secondary | ICD-10-CM | POA: Diagnosis not present

## 2020-04-20 DIAGNOSIS — D509 Iron deficiency anemia, unspecified: Secondary | ICD-10-CM | POA: Diagnosis not present

## 2020-04-20 DIAGNOSIS — N2581 Secondary hyperparathyroidism of renal origin: Secondary | ICD-10-CM | POA: Diagnosis not present

## 2020-04-23 DIAGNOSIS — Z992 Dependence on renal dialysis: Secondary | ICD-10-CM | POA: Diagnosis not present

## 2020-04-23 DIAGNOSIS — D509 Iron deficiency anemia, unspecified: Secondary | ICD-10-CM | POA: Diagnosis not present

## 2020-04-23 DIAGNOSIS — N2581 Secondary hyperparathyroidism of renal origin: Secondary | ICD-10-CM | POA: Diagnosis not present

## 2020-04-23 DIAGNOSIS — N186 End stage renal disease: Secondary | ICD-10-CM | POA: Diagnosis not present

## 2020-04-23 DIAGNOSIS — D631 Anemia in chronic kidney disease: Secondary | ICD-10-CM | POA: Diagnosis not present

## 2020-04-25 DIAGNOSIS — N2581 Secondary hyperparathyroidism of renal origin: Secondary | ICD-10-CM | POA: Diagnosis not present

## 2020-04-25 DIAGNOSIS — E119 Type 2 diabetes mellitus without complications: Secondary | ICD-10-CM | POA: Diagnosis not present

## 2020-04-25 DIAGNOSIS — Z992 Dependence on renal dialysis: Secondary | ICD-10-CM | POA: Diagnosis not present

## 2020-04-25 DIAGNOSIS — D631 Anemia in chronic kidney disease: Secondary | ICD-10-CM | POA: Diagnosis not present

## 2020-04-25 DIAGNOSIS — N186 End stage renal disease: Secondary | ICD-10-CM | POA: Diagnosis not present

## 2020-04-25 DIAGNOSIS — D509 Iron deficiency anemia, unspecified: Secondary | ICD-10-CM | POA: Diagnosis not present

## 2020-04-27 DIAGNOSIS — Z992 Dependence on renal dialysis: Secondary | ICD-10-CM | POA: Diagnosis not present

## 2020-04-27 DIAGNOSIS — D509 Iron deficiency anemia, unspecified: Secondary | ICD-10-CM | POA: Diagnosis not present

## 2020-04-27 DIAGNOSIS — N186 End stage renal disease: Secondary | ICD-10-CM | POA: Diagnosis not present

## 2020-04-27 DIAGNOSIS — D631 Anemia in chronic kidney disease: Secondary | ICD-10-CM | POA: Diagnosis not present

## 2020-04-27 DIAGNOSIS — N2581 Secondary hyperparathyroidism of renal origin: Secondary | ICD-10-CM | POA: Diagnosis not present

## 2020-04-30 DIAGNOSIS — N186 End stage renal disease: Secondary | ICD-10-CM | POA: Diagnosis not present

## 2020-04-30 DIAGNOSIS — Z992 Dependence on renal dialysis: Secondary | ICD-10-CM | POA: Diagnosis not present

## 2020-04-30 DIAGNOSIS — N2581 Secondary hyperparathyroidism of renal origin: Secondary | ICD-10-CM | POA: Diagnosis not present

## 2020-04-30 DIAGNOSIS — D509 Iron deficiency anemia, unspecified: Secondary | ICD-10-CM | POA: Diagnosis not present

## 2020-04-30 DIAGNOSIS — D631 Anemia in chronic kidney disease: Secondary | ICD-10-CM | POA: Diagnosis not present

## 2020-05-02 DIAGNOSIS — Z992 Dependence on renal dialysis: Secondary | ICD-10-CM | POA: Diagnosis not present

## 2020-05-02 DIAGNOSIS — E11621 Type 2 diabetes mellitus with foot ulcer: Secondary | ICD-10-CM | POA: Diagnosis not present

## 2020-05-02 DIAGNOSIS — L03116 Cellulitis of left lower limb: Secondary | ICD-10-CM | POA: Diagnosis not present

## 2020-05-02 DIAGNOSIS — L03115 Cellulitis of right lower limb: Secondary | ICD-10-CM | POA: Diagnosis not present

## 2020-05-02 DIAGNOSIS — L97522 Non-pressure chronic ulcer of other part of left foot with fat layer exposed: Secondary | ICD-10-CM | POA: Diagnosis not present

## 2020-05-02 DIAGNOSIS — D631 Anemia in chronic kidney disease: Secondary | ICD-10-CM | POA: Diagnosis not present

## 2020-05-02 DIAGNOSIS — L97512 Non-pressure chronic ulcer of other part of right foot with fat layer exposed: Secondary | ICD-10-CM | POA: Diagnosis not present

## 2020-05-02 DIAGNOSIS — N2581 Secondary hyperparathyroidism of renal origin: Secondary | ICD-10-CM | POA: Diagnosis not present

## 2020-05-02 DIAGNOSIS — L97322 Non-pressure chronic ulcer of left ankle with fat layer exposed: Secondary | ICD-10-CM | POA: Diagnosis not present

## 2020-05-02 DIAGNOSIS — D509 Iron deficiency anemia, unspecified: Secondary | ICD-10-CM | POA: Diagnosis not present

## 2020-05-02 DIAGNOSIS — L97312 Non-pressure chronic ulcer of right ankle with fat layer exposed: Secondary | ICD-10-CM | POA: Diagnosis not present

## 2020-05-02 DIAGNOSIS — E1142 Type 2 diabetes mellitus with diabetic polyneuropathy: Secondary | ICD-10-CM | POA: Diagnosis not present

## 2020-05-02 DIAGNOSIS — N186 End stage renal disease: Secondary | ICD-10-CM | POA: Diagnosis not present

## 2020-05-03 DIAGNOSIS — Z794 Long term (current) use of insulin: Secondary | ICD-10-CM | POA: Diagnosis not present

## 2020-05-03 DIAGNOSIS — J449 Chronic obstructive pulmonary disease, unspecified: Secondary | ICD-10-CM | POA: Diagnosis not present

## 2020-05-03 DIAGNOSIS — Z4901 Encounter for fitting and adjustment of extracorporeal dialysis catheter: Secondary | ICD-10-CM | POA: Diagnosis not present

## 2020-05-03 DIAGNOSIS — E1151 Type 2 diabetes mellitus with diabetic peripheral angiopathy without gangrene: Secondary | ICD-10-CM | POA: Diagnosis not present

## 2020-05-03 DIAGNOSIS — E1122 Type 2 diabetes mellitus with diabetic chronic kidney disease: Secondary | ICD-10-CM | POA: Diagnosis not present

## 2020-05-03 DIAGNOSIS — K219 Gastro-esophageal reflux disease without esophagitis: Secondary | ICD-10-CM | POA: Diagnosis not present

## 2020-05-03 DIAGNOSIS — I120 Hypertensive chronic kidney disease with stage 5 chronic kidney disease or end stage renal disease: Secondary | ICD-10-CM | POA: Diagnosis not present

## 2020-05-03 DIAGNOSIS — N186 End stage renal disease: Secondary | ICD-10-CM | POA: Diagnosis not present

## 2020-05-03 DIAGNOSIS — G4730 Sleep apnea, unspecified: Secondary | ICD-10-CM | POA: Diagnosis not present

## 2020-05-04 DIAGNOSIS — N2581 Secondary hyperparathyroidism of renal origin: Secondary | ICD-10-CM | POA: Diagnosis not present

## 2020-05-04 DIAGNOSIS — D509 Iron deficiency anemia, unspecified: Secondary | ICD-10-CM | POA: Diagnosis not present

## 2020-05-04 DIAGNOSIS — D631 Anemia in chronic kidney disease: Secondary | ICD-10-CM | POA: Diagnosis not present

## 2020-05-04 DIAGNOSIS — N186 End stage renal disease: Secondary | ICD-10-CM | POA: Diagnosis not present

## 2020-05-04 DIAGNOSIS — Z992 Dependence on renal dialysis: Secondary | ICD-10-CM | POA: Diagnosis not present

## 2020-05-07 DIAGNOSIS — Z992 Dependence on renal dialysis: Secondary | ICD-10-CM | POA: Diagnosis not present

## 2020-05-07 DIAGNOSIS — D631 Anemia in chronic kidney disease: Secondary | ICD-10-CM | POA: Diagnosis not present

## 2020-05-07 DIAGNOSIS — D509 Iron deficiency anemia, unspecified: Secondary | ICD-10-CM | POA: Diagnosis not present

## 2020-05-07 DIAGNOSIS — N2581 Secondary hyperparathyroidism of renal origin: Secondary | ICD-10-CM | POA: Diagnosis not present

## 2020-05-07 DIAGNOSIS — N186 End stage renal disease: Secondary | ICD-10-CM | POA: Diagnosis not present

## 2020-05-09 DIAGNOSIS — D509 Iron deficiency anemia, unspecified: Secondary | ICD-10-CM | POA: Diagnosis not present

## 2020-05-09 DIAGNOSIS — D631 Anemia in chronic kidney disease: Secondary | ICD-10-CM | POA: Diagnosis not present

## 2020-05-09 DIAGNOSIS — L97512 Non-pressure chronic ulcer of other part of right foot with fat layer exposed: Secondary | ICD-10-CM | POA: Diagnosis not present

## 2020-05-09 DIAGNOSIS — E11621 Type 2 diabetes mellitus with foot ulcer: Secondary | ICD-10-CM | POA: Diagnosis not present

## 2020-05-09 DIAGNOSIS — L03116 Cellulitis of left lower limb: Secondary | ICD-10-CM | POA: Diagnosis not present

## 2020-05-09 DIAGNOSIS — N186 End stage renal disease: Secondary | ICD-10-CM | POA: Diagnosis not present

## 2020-05-09 DIAGNOSIS — L97522 Non-pressure chronic ulcer of other part of left foot with fat layer exposed: Secondary | ICD-10-CM | POA: Diagnosis not present

## 2020-05-09 DIAGNOSIS — L03115 Cellulitis of right lower limb: Secondary | ICD-10-CM | POA: Diagnosis not present

## 2020-05-09 DIAGNOSIS — L97312 Non-pressure chronic ulcer of right ankle with fat layer exposed: Secondary | ICD-10-CM | POA: Diagnosis not present

## 2020-05-09 DIAGNOSIS — L97322 Non-pressure chronic ulcer of left ankle with fat layer exposed: Secondary | ICD-10-CM | POA: Diagnosis not present

## 2020-05-09 DIAGNOSIS — N2581 Secondary hyperparathyroidism of renal origin: Secondary | ICD-10-CM | POA: Diagnosis not present

## 2020-05-09 DIAGNOSIS — Z992 Dependence on renal dialysis: Secondary | ICD-10-CM | POA: Diagnosis not present

## 2020-05-09 DIAGNOSIS — E1142 Type 2 diabetes mellitus with diabetic polyneuropathy: Secondary | ICD-10-CM | POA: Diagnosis not present

## 2020-05-11 DIAGNOSIS — D631 Anemia in chronic kidney disease: Secondary | ICD-10-CM | POA: Diagnosis not present

## 2020-05-11 DIAGNOSIS — N2581 Secondary hyperparathyroidism of renal origin: Secondary | ICD-10-CM | POA: Diagnosis not present

## 2020-05-11 DIAGNOSIS — D509 Iron deficiency anemia, unspecified: Secondary | ICD-10-CM | POA: Diagnosis not present

## 2020-05-11 DIAGNOSIS — Z992 Dependence on renal dialysis: Secondary | ICD-10-CM | POA: Diagnosis not present

## 2020-05-11 DIAGNOSIS — N186 End stage renal disease: Secondary | ICD-10-CM | POA: Diagnosis not present

## 2020-05-14 DIAGNOSIS — D509 Iron deficiency anemia, unspecified: Secondary | ICD-10-CM | POA: Diagnosis not present

## 2020-05-14 DIAGNOSIS — Z992 Dependence on renal dialysis: Secondary | ICD-10-CM | POA: Diagnosis not present

## 2020-05-14 DIAGNOSIS — N186 End stage renal disease: Secondary | ICD-10-CM | POA: Diagnosis not present

## 2020-05-14 DIAGNOSIS — N2581 Secondary hyperparathyroidism of renal origin: Secondary | ICD-10-CM | POA: Diagnosis not present

## 2020-05-14 DIAGNOSIS — D631 Anemia in chronic kidney disease: Secondary | ICD-10-CM | POA: Diagnosis not present

## 2020-05-16 DIAGNOSIS — D509 Iron deficiency anemia, unspecified: Secondary | ICD-10-CM | POA: Diagnosis not present

## 2020-05-16 DIAGNOSIS — D631 Anemia in chronic kidney disease: Secondary | ICD-10-CM | POA: Diagnosis not present

## 2020-05-16 DIAGNOSIS — N186 End stage renal disease: Secondary | ICD-10-CM | POA: Diagnosis not present

## 2020-05-16 DIAGNOSIS — Z992 Dependence on renal dialysis: Secondary | ICD-10-CM | POA: Diagnosis not present

## 2020-05-16 DIAGNOSIS — N2581 Secondary hyperparathyroidism of renal origin: Secondary | ICD-10-CM | POA: Diagnosis not present

## 2020-05-18 DIAGNOSIS — Z992 Dependence on renal dialysis: Secondary | ICD-10-CM | POA: Diagnosis not present

## 2020-05-18 DIAGNOSIS — N186 End stage renal disease: Secondary | ICD-10-CM | POA: Diagnosis not present

## 2020-05-18 DIAGNOSIS — D509 Iron deficiency anemia, unspecified: Secondary | ICD-10-CM | POA: Diagnosis not present

## 2020-05-18 DIAGNOSIS — D631 Anemia in chronic kidney disease: Secondary | ICD-10-CM | POA: Diagnosis not present

## 2020-05-18 DIAGNOSIS — N2581 Secondary hyperparathyroidism of renal origin: Secondary | ICD-10-CM | POA: Diagnosis not present

## 2020-05-21 DIAGNOSIS — N186 End stage renal disease: Secondary | ICD-10-CM | POA: Diagnosis not present

## 2020-05-21 DIAGNOSIS — Z992 Dependence on renal dialysis: Secondary | ICD-10-CM | POA: Diagnosis not present

## 2020-05-21 DIAGNOSIS — N2581 Secondary hyperparathyroidism of renal origin: Secondary | ICD-10-CM | POA: Diagnosis not present

## 2020-05-21 DIAGNOSIS — D509 Iron deficiency anemia, unspecified: Secondary | ICD-10-CM | POA: Diagnosis not present

## 2020-05-21 DIAGNOSIS — D631 Anemia in chronic kidney disease: Secondary | ICD-10-CM | POA: Diagnosis not present

## 2020-05-23 DIAGNOSIS — E1142 Type 2 diabetes mellitus with diabetic polyneuropathy: Secondary | ICD-10-CM | POA: Diagnosis not present

## 2020-05-23 DIAGNOSIS — D631 Anemia in chronic kidney disease: Secondary | ICD-10-CM | POA: Diagnosis not present

## 2020-05-23 DIAGNOSIS — L97322 Non-pressure chronic ulcer of left ankle with fat layer exposed: Secondary | ICD-10-CM | POA: Diagnosis not present

## 2020-05-23 DIAGNOSIS — L03115 Cellulitis of right lower limb: Secondary | ICD-10-CM | POA: Diagnosis not present

## 2020-05-23 DIAGNOSIS — Z992 Dependence on renal dialysis: Secondary | ICD-10-CM | POA: Diagnosis not present

## 2020-05-23 DIAGNOSIS — L03116 Cellulitis of left lower limb: Secondary | ICD-10-CM | POA: Diagnosis not present

## 2020-05-23 DIAGNOSIS — L97312 Non-pressure chronic ulcer of right ankle with fat layer exposed: Secondary | ICD-10-CM | POA: Diagnosis not present

## 2020-05-23 DIAGNOSIS — L97522 Non-pressure chronic ulcer of other part of left foot with fat layer exposed: Secondary | ICD-10-CM | POA: Diagnosis not present

## 2020-05-23 DIAGNOSIS — N2581 Secondary hyperparathyroidism of renal origin: Secondary | ICD-10-CM | POA: Diagnosis not present

## 2020-05-23 DIAGNOSIS — E11621 Type 2 diabetes mellitus with foot ulcer: Secondary | ICD-10-CM | POA: Diagnosis not present

## 2020-05-23 DIAGNOSIS — L97512 Non-pressure chronic ulcer of other part of right foot with fat layer exposed: Secondary | ICD-10-CM | POA: Diagnosis not present

## 2020-05-23 DIAGNOSIS — D509 Iron deficiency anemia, unspecified: Secondary | ICD-10-CM | POA: Diagnosis not present

## 2020-05-23 DIAGNOSIS — N186 End stage renal disease: Secondary | ICD-10-CM | POA: Diagnosis not present

## 2020-05-24 DIAGNOSIS — L97522 Non-pressure chronic ulcer of other part of left foot with fat layer exposed: Secondary | ICD-10-CM | POA: Diagnosis not present

## 2020-05-24 DIAGNOSIS — R600 Localized edema: Secondary | ICD-10-CM | POA: Diagnosis not present

## 2020-05-24 DIAGNOSIS — L97529 Non-pressure chronic ulcer of other part of left foot with unspecified severity: Secondary | ICD-10-CM | POA: Diagnosis not present

## 2020-05-24 DIAGNOSIS — L97322 Non-pressure chronic ulcer of left ankle with fat layer exposed: Secondary | ICD-10-CM | POA: Diagnosis not present

## 2020-05-24 DIAGNOSIS — M7989 Other specified soft tissue disorders: Secondary | ICD-10-CM | POA: Diagnosis not present

## 2020-05-24 DIAGNOSIS — N2581 Secondary hyperparathyroidism of renal origin: Secondary | ICD-10-CM | POA: Diagnosis not present

## 2020-05-24 DIAGNOSIS — D631 Anemia in chronic kidney disease: Secondary | ICD-10-CM | POA: Diagnosis not present

## 2020-05-24 DIAGNOSIS — I120 Hypertensive chronic kidney disease with stage 5 chronic kidney disease or end stage renal disease: Secondary | ICD-10-CM | POA: Diagnosis not present

## 2020-05-24 DIAGNOSIS — L97329 Non-pressure chronic ulcer of left ankle with unspecified severity: Secondary | ICD-10-CM | POA: Diagnosis not present

## 2020-05-24 DIAGNOSIS — L089 Local infection of the skin and subcutaneous tissue, unspecified: Secondary | ICD-10-CM | POA: Diagnosis not present

## 2020-05-24 DIAGNOSIS — N189 Chronic kidney disease, unspecified: Secondary | ICD-10-CM | POA: Diagnosis not present

## 2020-05-24 DIAGNOSIS — L97312 Non-pressure chronic ulcer of right ankle with fat layer exposed: Secondary | ICD-10-CM | POA: Diagnosis not present

## 2020-05-24 DIAGNOSIS — E11621 Type 2 diabetes mellitus with foot ulcer: Secondary | ICD-10-CM | POA: Diagnosis not present

## 2020-05-24 DIAGNOSIS — Z992 Dependence on renal dialysis: Secondary | ICD-10-CM | POA: Diagnosis not present

## 2020-05-24 DIAGNOSIS — N186 End stage renal disease: Secondary | ICD-10-CM | POA: Diagnosis not present

## 2020-05-24 DIAGNOSIS — I160 Hypertensive urgency: Secondary | ICD-10-CM | POA: Diagnosis not present

## 2020-05-24 DIAGNOSIS — L97512 Non-pressure chronic ulcer of other part of right foot with fat layer exposed: Secondary | ICD-10-CM | POA: Diagnosis not present

## 2020-05-24 DIAGNOSIS — E11628 Type 2 diabetes mellitus with other skin complications: Secondary | ICD-10-CM | POA: Diagnosis not present

## 2020-05-25 DIAGNOSIS — L97512 Non-pressure chronic ulcer of other part of right foot with fat layer exposed: Secondary | ICD-10-CM | POA: Diagnosis not present

## 2020-05-25 DIAGNOSIS — E876 Hypokalemia: Secondary | ICD-10-CM | POA: Diagnosis not present

## 2020-05-25 DIAGNOSIS — I160 Hypertensive urgency: Secondary | ICD-10-CM | POA: Diagnosis not present

## 2020-05-25 DIAGNOSIS — E11628 Type 2 diabetes mellitus with other skin complications: Secondary | ICD-10-CM | POA: Diagnosis not present

## 2020-05-25 DIAGNOSIS — D631 Anemia in chronic kidney disease: Secondary | ICD-10-CM | POA: Diagnosis not present

## 2020-05-25 DIAGNOSIS — Z794 Long term (current) use of insulin: Secondary | ICD-10-CM | POA: Diagnosis not present

## 2020-05-25 DIAGNOSIS — Z992 Dependence on renal dialysis: Secondary | ICD-10-CM | POA: Diagnosis not present

## 2020-05-25 DIAGNOSIS — N186 End stage renal disease: Secondary | ICD-10-CM | POA: Diagnosis not present

## 2020-05-25 DIAGNOSIS — E1159 Type 2 diabetes mellitus with other circulatory complications: Secondary | ICD-10-CM | POA: Diagnosis not present

## 2020-05-25 DIAGNOSIS — E11621 Type 2 diabetes mellitus with foot ulcer: Secondary | ICD-10-CM | POA: Diagnosis not present

## 2020-05-25 DIAGNOSIS — N189 Chronic kidney disease, unspecified: Secondary | ICD-10-CM | POA: Diagnosis not present

## 2020-05-25 DIAGNOSIS — L97312 Non-pressure chronic ulcer of right ankle with fat layer exposed: Secondary | ICD-10-CM | POA: Diagnosis not present

## 2020-05-25 DIAGNOSIS — L089 Local infection of the skin and subcutaneous tissue, unspecified: Secondary | ICD-10-CM | POA: Diagnosis not present

## 2020-05-25 DIAGNOSIS — L97522 Non-pressure chronic ulcer of other part of left foot with fat layer exposed: Secondary | ICD-10-CM | POA: Diagnosis not present

## 2020-05-25 DIAGNOSIS — L97322 Non-pressure chronic ulcer of left ankle with fat layer exposed: Secondary | ICD-10-CM | POA: Diagnosis not present
